# Patient Record
Sex: Female | Born: 1935
Health system: Southern US, Community
[De-identification: ages and names within clinical notes are randomized; demographics above are authoritative.]

## PROBLEM LIST (undated history)

## (undated) DIAGNOSIS — Z9889 Other specified postprocedural states: Secondary | ICD-10-CM

## (undated) DIAGNOSIS — H5440 Blindness, one eye, unspecified eye: Secondary | ICD-10-CM

## (undated) DIAGNOSIS — I499 Cardiac arrhythmia, unspecified: Secondary | ICD-10-CM

## (undated) DIAGNOSIS — G629 Polyneuropathy, unspecified: Secondary | ICD-10-CM

## (undated) DIAGNOSIS — Z87898 Personal history of other specified conditions: Secondary | ICD-10-CM

## (undated) DIAGNOSIS — R112 Nausea with vomiting, unspecified: Secondary | ICD-10-CM

## (undated) DIAGNOSIS — E119 Type 2 diabetes mellitus without complications: Secondary | ICD-10-CM

## (undated) DIAGNOSIS — Z8719 Personal history of other diseases of the digestive system: Secondary | ICD-10-CM

## (undated) DIAGNOSIS — H409 Unspecified glaucoma: Secondary | ICD-10-CM

## (undated) DIAGNOSIS — T4145XA Adverse effect of unspecified anesthetic, initial encounter: Secondary | ICD-10-CM

## (undated) DIAGNOSIS — I639 Cerebral infarction, unspecified: Secondary | ICD-10-CM

## (undated) DIAGNOSIS — R7302 Impaired glucose tolerance (oral): Secondary | ICD-10-CM

## (undated) DIAGNOSIS — G43109 Migraine with aura, not intractable, without status migrainosus: Secondary | ICD-10-CM

## (undated) DIAGNOSIS — T8859XA Other complications of anesthesia, initial encounter: Secondary | ICD-10-CM

## (undated) DIAGNOSIS — I10 Essential (primary) hypertension: Secondary | ICD-10-CM

## (undated) DIAGNOSIS — M3500 Sicca syndrome, unspecified: Secondary | ICD-10-CM

## (undated) DIAGNOSIS — J189 Pneumonia, unspecified organism: Secondary | ICD-10-CM

## (undated) DIAGNOSIS — I480 Paroxysmal atrial fibrillation: Secondary | ICD-10-CM

## (undated) DIAGNOSIS — T6701XA Heatstroke and sunstroke, initial encounter: Secondary | ICD-10-CM

## (undated) DIAGNOSIS — T7840XA Allergy, unspecified, initial encounter: Secondary | ICD-10-CM

## (undated) DIAGNOSIS — E871 Hypo-osmolality and hyponatremia: Secondary | ICD-10-CM

## (undated) DIAGNOSIS — H548 Legal blindness, as defined in USA: Secondary | ICD-10-CM

## (undated) DIAGNOSIS — R569 Unspecified convulsions: Secondary | ICD-10-CM

## (undated) DIAGNOSIS — G4730 Sleep apnea, unspecified: Secondary | ICD-10-CM

## (undated) DIAGNOSIS — M810 Age-related osteoporosis without current pathological fracture: Secondary | ICD-10-CM

## (undated) DIAGNOSIS — J9601 Acute respiratory failure with hypoxia: Secondary | ICD-10-CM

## (undated) DIAGNOSIS — R269 Unspecified abnormalities of gait and mobility: Secondary | ICD-10-CM

## (undated) DIAGNOSIS — M1990 Unspecified osteoarthritis, unspecified site: Secondary | ICD-10-CM

## (undated) DIAGNOSIS — G43909 Migraine, unspecified, not intractable, without status migrainosus: Secondary | ICD-10-CM

## (undated) DIAGNOSIS — K219 Gastro-esophageal reflux disease without esophagitis: Secondary | ICD-10-CM

## (undated) HISTORY — DX: Cerebral infarction, unspecified: I63.9

## (undated) HISTORY — DX: Blindness, one eye, unspecified eye: H54.40

## (undated) HISTORY — DX: Polyneuropathy, unspecified: G62.9

## (undated) HISTORY — DX: Age-related osteoporosis without current pathological fracture: M81.0

## (undated) HISTORY — DX: Heatstroke and sunstroke, initial encounter: T67.01XA

## (undated) HISTORY — DX: Unspecified abnormalities of gait and mobility: R26.9

## (undated) HISTORY — DX: Unspecified osteoarthritis, unspecified site: M19.90

## (undated) HISTORY — PX: CATARACT EXTRACTION: SUR2

## (undated) HISTORY — DX: Sjogren syndrome, unspecified: M35.00

## (undated) HISTORY — DX: Allergy, unspecified, initial encounter: T78.40XA

## (undated) HISTORY — PX: LUMBAR LAMINECTOMY: SHX95

## (undated) HISTORY — DX: Migraine with aura, not intractable, without status migrainosus: G43.109

## (undated) HISTORY — DX: Personal history of other diseases of the digestive system: Z87.19

## (undated) HISTORY — DX: Unspecified convulsions: R56.9

## (undated) HISTORY — DX: Gastro-esophageal reflux disease without esophagitis: K21.9

## (undated) HISTORY — DX: Impaired glucose tolerance (oral): R73.02

## (undated) HISTORY — PX: TONSILLECTOMY AND ADENOIDECTOMY: SUR1326

## (undated) HISTORY — DX: Paroxysmal atrial fibrillation: I48.0

## (undated) HISTORY — PX: DILATION AND CURETTAGE OF UTERUS: SHX78

## (undated) HISTORY — PX: EYE SURGERY: SHX253

## (undated) HISTORY — DX: Unspecified glaucoma: H40.9

## (undated) HISTORY — PX: TOTAL ABDOMINAL HYSTERECTOMY W/ BILATERAL SALPINGOOPHORECTOMY: SHX83

## (undated) HISTORY — PX: SHOULDER OPEN ROTATOR CUFF REPAIR: SHX2407

## (undated) HISTORY — PX: KNEE ARTHROSCOPY: SUR90

## (undated) HISTORY — PX: BONE TUMOR EXCISION: SHX1254

## (undated) SURGERY — IMAGING PROCEDURE, GI TRACT, INTRALUMINAL, VIA CAPSULE
Anesthesia: LOCAL

---

## 1998-04-04 ENCOUNTER — Other Ambulatory Visit: Admission: RE | Admit: 1998-04-04 | Discharge: 1998-04-04 | Payer: Self-pay | Admitting: Gastroenterology

## 1998-04-19 ENCOUNTER — Ambulatory Visit (HOSPITAL_COMMUNITY): Admission: RE | Admit: 1998-04-19 | Discharge: 1998-04-19 | Payer: Self-pay | Admitting: Gastroenterology

## 1998-05-09 ENCOUNTER — Ambulatory Visit (HOSPITAL_COMMUNITY): Admission: RE | Admit: 1998-05-09 | Discharge: 1998-05-09 | Payer: Self-pay | Admitting: Obstetrics & Gynecology

## 1998-11-16 ENCOUNTER — Other Ambulatory Visit: Admission: RE | Admit: 1998-11-16 | Discharge: 1998-11-16 | Payer: Self-pay | Admitting: Obstetrics and Gynecology

## 1998-11-16 ENCOUNTER — Encounter (INDEPENDENT_AMBULATORY_CARE_PROVIDER_SITE_OTHER): Payer: Self-pay

## 1998-12-25 ENCOUNTER — Encounter (INDEPENDENT_AMBULATORY_CARE_PROVIDER_SITE_OTHER): Payer: Self-pay | Admitting: Specialist

## 1998-12-25 ENCOUNTER — Inpatient Hospital Stay (HOSPITAL_COMMUNITY): Admission: RE | Admit: 1998-12-25 | Discharge: 1998-12-27 | Payer: Self-pay | Admitting: Obstetrics and Gynecology

## 2001-09-22 ENCOUNTER — Other Ambulatory Visit: Admission: RE | Admit: 2001-09-22 | Discharge: 2001-09-22 | Payer: Self-pay | Admitting: Obstetrics and Gynecology

## 2003-03-31 ENCOUNTER — Encounter: Admission: RE | Admit: 2003-03-31 | Discharge: 2003-03-31 | Payer: Self-pay | Admitting: Internal Medicine

## 2004-01-06 ENCOUNTER — Encounter: Admission: RE | Admit: 2004-01-06 | Discharge: 2004-01-06 | Payer: Self-pay | Admitting: Family Medicine

## 2004-01-20 ENCOUNTER — Ambulatory Visit: Payer: Self-pay | Admitting: Internal Medicine

## 2004-01-20 ENCOUNTER — Ambulatory Visit: Payer: Self-pay

## 2004-02-03 ENCOUNTER — Ambulatory Visit: Payer: Self-pay

## 2004-02-08 ENCOUNTER — Ambulatory Visit: Payer: Self-pay | Admitting: Cardiology

## 2004-02-17 ENCOUNTER — Ambulatory Visit: Payer: Self-pay | Admitting: Cardiology

## 2004-04-25 ENCOUNTER — Ambulatory Visit: Payer: Self-pay | Admitting: Cardiology

## 2004-04-26 ENCOUNTER — Ambulatory Visit: Payer: Self-pay | Admitting: *Deleted

## 2004-05-29 ENCOUNTER — Inpatient Hospital Stay (HOSPITAL_COMMUNITY): Admission: EM | Admit: 2004-05-29 | Discharge: 2004-05-31 | Payer: Self-pay | Admitting: Family Medicine

## 2004-05-29 ENCOUNTER — Ambulatory Visit: Payer: Self-pay | Admitting: Family Medicine

## 2004-06-08 ENCOUNTER — Ambulatory Visit: Payer: Self-pay | Admitting: Family Medicine

## 2004-06-13 ENCOUNTER — Ambulatory Visit: Payer: Self-pay | Admitting: Family Medicine

## 2004-06-20 ENCOUNTER — Ambulatory Visit: Payer: Self-pay | Admitting: Cardiology

## 2004-06-21 ENCOUNTER — Ambulatory Visit: Payer: Self-pay | Admitting: Internal Medicine

## 2004-07-12 ENCOUNTER — Ambulatory Visit: Payer: Self-pay | Admitting: Family Medicine

## 2004-09-21 ENCOUNTER — Ambulatory Visit: Payer: Self-pay | Admitting: Internal Medicine

## 2004-09-26 ENCOUNTER — Ambulatory Visit: Payer: Self-pay | Admitting: Cardiology

## 2004-11-27 ENCOUNTER — Ambulatory Visit: Payer: Self-pay | Admitting: *Deleted

## 2004-11-28 ENCOUNTER — Ambulatory Visit: Payer: Self-pay | Admitting: Cardiology

## 2004-12-18 ENCOUNTER — Other Ambulatory Visit: Admission: RE | Admit: 2004-12-18 | Discharge: 2004-12-18 | Payer: Self-pay | Admitting: Obstetrics and Gynecology

## 2005-03-18 HISTORY — PX: FOOT FRACTURE SURGERY: SHX645

## 2005-03-20 ENCOUNTER — Encounter: Admission: RE | Admit: 2005-03-20 | Discharge: 2005-03-20 | Payer: Self-pay | Admitting: Gastroenterology

## 2005-05-15 ENCOUNTER — Ambulatory Visit: Payer: Self-pay | Admitting: Cardiology

## 2005-05-23 ENCOUNTER — Ambulatory Visit: Payer: Self-pay | Admitting: Cardiology

## 2005-06-28 ENCOUNTER — Ambulatory Visit: Payer: Self-pay | Admitting: Family Medicine

## 2005-10-30 ENCOUNTER — Ambulatory Visit: Payer: Self-pay | Admitting: Family Medicine

## 2005-11-04 ENCOUNTER — Ambulatory Visit: Payer: Self-pay | Admitting: Cardiology

## 2005-11-07 ENCOUNTER — Ambulatory Visit: Payer: Self-pay | Admitting: Cardiology

## 2005-11-14 ENCOUNTER — Encounter: Admission: RE | Admit: 2005-11-14 | Discharge: 2005-11-14 | Payer: Self-pay | Admitting: Family Medicine

## 2006-01-14 ENCOUNTER — Ambulatory Visit: Payer: Self-pay | Admitting: Family Medicine

## 2006-01-14 LAB — CONVERTED CEMR LAB
BUN: 21 mg/dL (ref 6–23)
CO2: 29 meq/L (ref 19–32)
Calcium: 10.1 mg/dL (ref 8.4–10.5)
Chloride: 105 meq/L (ref 96–112)
Creatinine, Ser: 0.8 mg/dL (ref 0.4–1.2)
GFR calc non Af Amer: 76 mL/min
Glomerular Filtration Rate, Af Am: 92 mL/min/{1.73_m2}
Glucose, Bld: 129 mg/dL — ABNORMAL HIGH (ref 70–99)
Potassium: 3.8 meq/L (ref 3.5–5.1)
Sodium: 139 meq/L (ref 135–145)

## 2006-01-28 ENCOUNTER — Ambulatory Visit: Payer: Self-pay | Admitting: Family Medicine

## 2006-01-28 LAB — CONVERTED CEMR LAB
Glucose, Bld: 123 mg/dL — ABNORMAL HIGH (ref 70–99)
Hgb A1c MFr Bld: 6.8 % — ABNORMAL HIGH (ref 4.6–6.0)

## 2006-02-11 ENCOUNTER — Ambulatory Visit: Payer: Self-pay | Admitting: Family Medicine

## 2006-04-30 ENCOUNTER — Ambulatory Visit: Payer: Self-pay | Admitting: Cardiology

## 2006-04-30 LAB — CONVERTED CEMR LAB
ALT: 40 units/L (ref 0–40)
AST: 34 units/L (ref 0–37)
Albumin: 3.9 g/dL (ref 3.5–5.2)
Alkaline Phosphatase: 56 units/L (ref 39–117)
Bilirubin, Direct: 0.2 mg/dL (ref 0.0–0.3)
Cholesterol: 136 mg/dL (ref 0–200)
HDL: 40.3 mg/dL (ref >39.0)
LDL Cholesterol: 67 mg/dL (ref 0–99)
Total Bilirubin: 0.6 mg/dL (ref 0.3–1.2)
Total CHOL/HDL Ratio: 3.4
Total Protein: 6.9 g/dL (ref 6.0–8.3)
Triglycerides: 143 mg/dL (ref 0–149)
VLDL: 29 mg/dL (ref 0–40)

## 2006-05-08 ENCOUNTER — Ambulatory Visit: Payer: Self-pay | Admitting: Cardiology

## 2006-05-16 ENCOUNTER — Ambulatory Visit: Payer: Self-pay | Admitting: Family Medicine

## 2006-05-22 ENCOUNTER — Encounter: Payer: Self-pay | Admitting: Family Medicine

## 2006-05-22 LAB — CONVERTED CEMR LAB
BUN: 18 mg/dL (ref 6–23)
Creatinine, Ser: 0.91 mg/dL (ref 0.40–1.20)

## 2006-05-27 ENCOUNTER — Encounter: Admission: RE | Admit: 2006-05-27 | Discharge: 2006-05-27 | Payer: Self-pay | Admitting: Family Medicine

## 2006-08-27 ENCOUNTER — Telehealth (INDEPENDENT_AMBULATORY_CARE_PROVIDER_SITE_OTHER): Payer: Self-pay | Admitting: *Deleted

## 2006-08-28 DIAGNOSIS — I10 Essential (primary) hypertension: Secondary | ICD-10-CM | POA: Insufficient documentation

## 2006-08-28 DIAGNOSIS — M810 Age-related osteoporosis without current pathological fracture: Secondary | ICD-10-CM | POA: Insufficient documentation

## 2006-08-28 DIAGNOSIS — K219 Gastro-esophageal reflux disease without esophagitis: Secondary | ICD-10-CM | POA: Insufficient documentation

## 2006-08-28 DIAGNOSIS — E781 Pure hyperglyceridemia: Secondary | ICD-10-CM | POA: Insufficient documentation

## 2006-08-28 DIAGNOSIS — H409 Unspecified glaucoma: Secondary | ICD-10-CM | POA: Insufficient documentation

## 2006-08-28 DIAGNOSIS — M1990 Unspecified osteoarthritis, unspecified site: Secondary | ICD-10-CM | POA: Insufficient documentation

## 2006-08-28 DIAGNOSIS — G43909 Migraine, unspecified, not intractable, without status migrainosus: Secondary | ICD-10-CM | POA: Insufficient documentation

## 2006-08-28 DIAGNOSIS — L408 Other psoriasis: Secondary | ICD-10-CM | POA: Insufficient documentation

## 2006-11-05 ENCOUNTER — Ambulatory Visit: Payer: Self-pay | Admitting: Cardiology

## 2006-11-05 LAB — CONVERTED CEMR LAB
ALT: 33 units/L (ref 0–35)
AST: 29 units/L (ref 0–37)
Albumin: 3.7 g/dL (ref 3.5–5.2)
Alkaline Phosphatase: 56 units/L (ref 39–117)
Bilirubin, Direct: 0.1 mg/dL (ref 0.0–0.3)
Cholesterol: 161 mg/dL (ref 0–200)
HDL: 46.8 mg/dL (ref >39.0)
LDL Cholesterol: 94 mg/dL (ref 0–99)
Total Bilirubin: 0.6 mg/dL (ref 0.3–1.2)
Total CHOL/HDL Ratio: 3.4
Total Protein: 6.5 g/dL (ref 6.0–8.3)
Triglycerides: 100 mg/dL (ref 0–149)
VLDL: 20 mg/dL (ref 0–40)

## 2006-11-06 ENCOUNTER — Ambulatory Visit: Payer: Self-pay | Admitting: Cardiology

## 2006-11-18 ENCOUNTER — Ambulatory Visit: Payer: Self-pay | Admitting: Family Medicine

## 2006-11-18 DIAGNOSIS — R197 Diarrhea, unspecified: Secondary | ICD-10-CM | POA: Insufficient documentation

## 2006-11-18 DIAGNOSIS — K921 Melena: Secondary | ICD-10-CM | POA: Insufficient documentation

## 2006-11-19 ENCOUNTER — Encounter: Payer: Self-pay | Admitting: Family Medicine

## 2006-11-20 ENCOUNTER — Encounter (INDEPENDENT_AMBULATORY_CARE_PROVIDER_SITE_OTHER): Payer: Self-pay | Admitting: *Deleted

## 2006-11-24 LAB — CONVERTED CEMR LAB
ALT: 28 units/L (ref 0–35)
AST: 28 units/L (ref 0–37)
Albumin: 3.4 g/dL — ABNORMAL LOW (ref 3.5–5.2)
Alkaline Phosphatase: 63 units/L (ref 39–117)
BUN: 14 mg/dL (ref 6–23)
Basophils Absolute: 0 10*3/uL (ref 0.0–0.1)
Basophils Relative: 0.4 % (ref 0.0–1.0)
Bilirubin, Direct: 0.1 mg/dL (ref 0.0–0.3)
CO2: 28 meq/L (ref 19–32)
Calcium: 10 mg/dL (ref 8.4–10.5)
Chloride: 105 meq/L (ref 96–112)
Creatinine, Ser: 0.9 mg/dL (ref 0.4–1.2)
Eosinophils Absolute: 0.1 10*3/uL (ref 0.0–0.6)
Eosinophils Relative: 1.9 % (ref 0.0–5.0)
GFR calc Af Amer: 80 mL/min
GFR calc non Af Amer: 66 mL/min
Glucose, Bld: 103 mg/dL — ABNORMAL HIGH (ref 70–99)
HCT: 41.2 % (ref 36.0–46.0)
Hemoglobin: 14.4 g/dL (ref 12.0–15.0)
Lymphocytes Relative: 32.3 % (ref 12.0–46.0)
MCHC: 34.9 g/dL (ref 30.0–36.0)
MCV: 89.5 fL (ref 78.0–100.0)
Monocytes Absolute: 1 10*3/uL — ABNORMAL HIGH (ref 0.2–0.7)
Monocytes Relative: 13.2 % — ABNORMAL HIGH (ref 3.0–11.0)
Neutro Abs: 3.8 10*3/uL (ref 1.4–7.7)
Neutrophils Relative %: 52.2 % (ref 43.0–77.0)
Platelets: 205 10*3/uL (ref 150–400)
Potassium: 4 meq/L (ref 3.5–5.1)
RBC: 4.61 M/uL (ref 3.87–5.11)
RDW: 12.7 % (ref 11.5–14.6)
Sodium: 140 meq/L (ref 135–145)
Total Bilirubin: 0.5 mg/dL (ref 0.3–1.2)
Total Protein: 6.6 g/dL (ref 6.0–8.3)
WBC: 7.3 10*3/uL (ref 4.5–10.5)

## 2006-11-27 ENCOUNTER — Encounter: Payer: Self-pay | Admitting: Family Medicine

## 2006-12-31 ENCOUNTER — Ambulatory Visit: Payer: Self-pay | Admitting: Family Medicine

## 2006-12-31 DIAGNOSIS — H6690 Otitis media, unspecified, unspecified ear: Secondary | ICD-10-CM | POA: Insufficient documentation

## 2006-12-31 DIAGNOSIS — J069 Acute upper respiratory infection, unspecified: Secondary | ICD-10-CM | POA: Insufficient documentation

## 2007-01-07 ENCOUNTER — Telehealth (INDEPENDENT_AMBULATORY_CARE_PROVIDER_SITE_OTHER): Payer: Self-pay | Admitting: *Deleted

## 2007-01-08 ENCOUNTER — Ambulatory Visit: Payer: Self-pay | Admitting: Family Medicine

## 2007-01-08 DIAGNOSIS — H9209 Otalgia, unspecified ear: Secondary | ICD-10-CM | POA: Insufficient documentation

## 2007-01-14 ENCOUNTER — Ambulatory Visit: Payer: Self-pay | Admitting: Family Medicine

## 2007-01-15 ENCOUNTER — Encounter: Payer: Self-pay | Admitting: Family Medicine

## 2007-01-19 ENCOUNTER — Telehealth: Payer: Self-pay | Admitting: Family Medicine

## 2007-01-23 ENCOUNTER — Encounter: Payer: Self-pay | Admitting: Family Medicine

## 2007-01-29 ENCOUNTER — Ambulatory Visit: Payer: Self-pay | Admitting: Cardiology

## 2007-01-29 LAB — CONVERTED CEMR LAB
ALT: 31 units/L (ref 0–35)
AST: 30 units/L (ref 0–37)
Albumin: 4 g/dL (ref 3.5–5.2)
Alkaline Phosphatase: 58 units/L (ref 39–117)
Bilirubin, Direct: 0.1 mg/dL (ref 0.0–0.3)
Cholesterol: 140 mg/dL (ref 0–200)
HDL: 43.8 mg/dL (ref >39.0)
LDL Cholesterol: 67 mg/dL (ref 0–99)
Total Bilirubin: 0.6 mg/dL (ref 0.3–1.2)
Total CHOL/HDL Ratio: 3.2
Total CK: 78 units/L (ref 7–177)
Total Protein: 7.2 g/dL (ref 6.0–8.3)
Triglycerides: 146 mg/dL (ref 0–149)
VLDL: 29 mg/dL (ref 0–40)

## 2007-02-05 ENCOUNTER — Ambulatory Visit: Payer: Self-pay | Admitting: Cardiology

## 2007-02-06 ENCOUNTER — Encounter: Payer: Self-pay | Admitting: Family Medicine

## 2007-02-11 ENCOUNTER — Ambulatory Visit (HOSPITAL_COMMUNITY): Admission: RE | Admit: 2007-02-11 | Discharge: 2007-02-11 | Payer: Self-pay | Admitting: Otolaryngology

## 2007-02-17 ENCOUNTER — Encounter: Payer: Self-pay | Admitting: Family Medicine

## 2007-04-07 ENCOUNTER — Ambulatory Visit: Payer: Self-pay | Admitting: Family Medicine

## 2007-04-07 DIAGNOSIS — G4700 Insomnia, unspecified: Secondary | ICD-10-CM | POA: Insufficient documentation

## 2007-04-09 ENCOUNTER — Encounter: Payer: Self-pay | Admitting: Family Medicine

## 2007-04-21 ENCOUNTER — Encounter: Payer: Self-pay | Admitting: Family Medicine

## 2007-05-29 ENCOUNTER — Telehealth (INDEPENDENT_AMBULATORY_CARE_PROVIDER_SITE_OTHER): Payer: Self-pay | Admitting: *Deleted

## 2007-06-08 ENCOUNTER — Telehealth (INDEPENDENT_AMBULATORY_CARE_PROVIDER_SITE_OTHER): Payer: Self-pay | Admitting: *Deleted

## 2007-06-08 ENCOUNTER — Encounter: Payer: Self-pay | Admitting: Family Medicine

## 2007-07-06 ENCOUNTER — Telehealth (INDEPENDENT_AMBULATORY_CARE_PROVIDER_SITE_OTHER): Payer: Self-pay | Admitting: *Deleted

## 2007-07-07 ENCOUNTER — Ambulatory Visit: Payer: Self-pay | Admitting: Family Medicine

## 2007-07-15 ENCOUNTER — Ambulatory Visit: Payer: Self-pay | Admitting: Cardiology

## 2007-07-15 LAB — CONVERTED CEMR LAB
ALT: 31 units/L (ref 0–35)
AST: 29 units/L (ref 0–37)
Albumin: 3.7 g/dL (ref 3.5–5.2)
Alkaline Phosphatase: 55 units/L (ref 39–117)
Bilirubin, Direct: 0.1 mg/dL (ref 0.0–0.3)
Cholesterol: 135 mg/dL (ref 0–200)
HDL: 34.1 mg/dL — ABNORMAL LOW (ref >39.0)
LDL Cholesterol: 74 mg/dL (ref 0–99)
Total Bilirubin: 0.5 mg/dL (ref 0.3–1.2)
Total CHOL/HDL Ratio: 4
Total Protein: 6.4 g/dL (ref 6.0–8.3)
Triglycerides: 134 mg/dL (ref 0–149)
VLDL: 27 mg/dL (ref 0–40)

## 2007-07-23 ENCOUNTER — Ambulatory Visit: Payer: Self-pay | Admitting: Internal Medicine

## 2007-07-27 ENCOUNTER — Telehealth (INDEPENDENT_AMBULATORY_CARE_PROVIDER_SITE_OTHER): Payer: Self-pay | Admitting: *Deleted

## 2007-08-11 ENCOUNTER — Encounter: Payer: Self-pay | Admitting: Family Medicine

## 2007-09-14 ENCOUNTER — Encounter: Payer: Self-pay | Admitting: Family Medicine

## 2007-10-26 ENCOUNTER — Encounter: Payer: Self-pay | Admitting: Family Medicine

## 2007-10-29 ENCOUNTER — Ambulatory Visit: Payer: Self-pay | Admitting: Family Medicine

## 2007-10-29 DIAGNOSIS — R5381 Other malaise: Secondary | ICD-10-CM | POA: Insufficient documentation

## 2007-10-29 DIAGNOSIS — R5383 Other fatigue: Secondary | ICD-10-CM

## 2007-11-05 ENCOUNTER — Encounter (INDEPENDENT_AMBULATORY_CARE_PROVIDER_SITE_OTHER): Payer: Self-pay | Admitting: *Deleted

## 2007-11-05 ENCOUNTER — Telehealth (INDEPENDENT_AMBULATORY_CARE_PROVIDER_SITE_OTHER): Payer: Self-pay | Admitting: *Deleted

## 2007-11-05 LAB — CONVERTED CEMR LAB: Vit D, 1,25-Dihydroxy: 22 — ABNORMAL LOW (ref 30–89)

## 2007-11-09 ENCOUNTER — Encounter: Admission: RE | Admit: 2007-11-09 | Discharge: 2007-11-09 | Payer: Self-pay | Admitting: Family Medicine

## 2007-11-09 LAB — CONVERTED CEMR LAB
ALT: 40 units/L — ABNORMAL HIGH (ref 0–35)
AST: 42 units/L — ABNORMAL HIGH (ref 0–37)
Albumin: 4.2 g/dL (ref 3.5–5.2)
Alkaline Phosphatase: 55 units/L (ref 39–117)
BUN: 18 mg/dL (ref 6–23)
Basophils Absolute: 0 10*3/uL (ref 0.0–0.1)
Basophils Relative: 0.4 % (ref 0.0–3.0)
Bilirubin, Direct: 0.1 mg/dL (ref 0.0–0.3)
CO2: 27 meq/L (ref 19–32)
Calcium: 10.1 mg/dL (ref 8.4–10.5)
Chloride: 106 meq/L (ref 96–112)
Cholesterol: 176 mg/dL (ref 0–200)
Creatinine, Ser: 0.9 mg/dL (ref 0.4–1.2)
Eosinophils Absolute: 0.2 10*3/uL (ref 0.0–0.7)
Eosinophils Relative: 2.4 % (ref 0.0–5.0)
Folate: >20 ng/mL
GFR calc Af Amer: 80 mL/min
GFR calc non Af Amer: 66 mL/min
Glucose, Bld: 118 mg/dL — ABNORMAL HIGH (ref 70–99)
HCT: 41.8 % (ref 36.0–46.0)
HDL: 42.8 mg/dL (ref >39.0)
Hemoglobin: 14.3 g/dL (ref 12.0–15.0)
LDL Cholesterol: 101 mg/dL — ABNORMAL HIGH (ref 0–99)
Lymphocytes Relative: 29.9 % (ref 12.0–46.0)
MCHC: 34.3 g/dL (ref 30.0–36.0)
MCV: 89.3 fL (ref 78.0–100.0)
Monocytes Absolute: 0.5 10*3/uL (ref 0.1–1.0)
Monocytes Relative: 7.2 % (ref 3.0–12.0)
Neutro Abs: 4.3 10*3/uL (ref 1.4–7.7)
Neutrophils Relative %: 60.1 % (ref 43.0–77.0)
Platelets: 207 10*3/uL (ref 150–400)
Potassium: 4.2 meq/L (ref 3.5–5.1)
RBC: 4.68 M/uL (ref 3.87–5.11)
RDW: 13 % (ref 11.5–14.6)
Sodium: 139 meq/L (ref 135–145)
TSH: 0.77 microintl units/mL (ref 0.35–5.50)
Total Bilirubin: 0.7 mg/dL (ref 0.3–1.2)
Total CHOL/HDL Ratio: 4.1
Total Protein: 6.9 g/dL (ref 6.0–8.3)
Triglycerides: 162 mg/dL — ABNORMAL HIGH (ref 0–149)
VLDL: 32 mg/dL (ref 0–40)
Vitamin B-12: 280 pg/mL (ref 211–911)
WBC: 7.1 10*3/uL (ref 4.5–10.5)

## 2007-11-10 ENCOUNTER — Telehealth (INDEPENDENT_AMBULATORY_CARE_PROVIDER_SITE_OTHER): Payer: Self-pay | Admitting: *Deleted

## 2007-11-10 ENCOUNTER — Encounter (INDEPENDENT_AMBULATORY_CARE_PROVIDER_SITE_OTHER): Payer: Self-pay | Admitting: *Deleted

## 2007-12-18 ENCOUNTER — Telehealth: Payer: Self-pay | Admitting: Family Medicine

## 2008-01-19 ENCOUNTER — Ambulatory Visit: Payer: Self-pay | Admitting: Cardiology

## 2008-01-19 LAB — CONVERTED CEMR LAB
ALT: 33 units/L (ref 0–35)
AST: 33 units/L (ref 0–37)
Albumin: 3.9 g/dL (ref 3.5–5.2)
Alkaline Phosphatase: 58 units/L (ref 39–117)
Bilirubin, Direct: 0.2 mg/dL (ref 0.0–0.3)
Cholesterol: 120 mg/dL (ref 0–200)
HDL: 40.9 mg/dL (ref >39.0)
LDL Cholesterol: 56 mg/dL (ref 0–99)
Total Bilirubin: 0.6 mg/dL (ref 0.3–1.2)
Total CHOL/HDL Ratio: 2.9
Total Protein: 6.6 g/dL (ref 6.0–8.3)
Triglycerides: 118 mg/dL (ref 0–149)
VLDL: 24 mg/dL (ref 0–40)

## 2008-01-21 ENCOUNTER — Ambulatory Visit: Payer: Self-pay | Admitting: Cardiovascular Disease

## 2008-02-19 ENCOUNTER — Telehealth: Payer: Self-pay | Admitting: Family Medicine

## 2008-03-09 ENCOUNTER — Telehealth: Payer: Self-pay | Admitting: Family Medicine

## 2008-03-09 DIAGNOSIS — R920 Mammographic microcalcification found on diagnostic imaging of breast: Secondary | ICD-10-CM | POA: Insufficient documentation

## 2008-03-24 ENCOUNTER — Encounter: Payer: Self-pay | Admitting: Family Medicine

## 2008-04-01 ENCOUNTER — Encounter (INDEPENDENT_AMBULATORY_CARE_PROVIDER_SITE_OTHER): Payer: Self-pay | Admitting: *Deleted

## 2008-05-03 ENCOUNTER — Encounter: Payer: Self-pay | Admitting: Family Medicine

## 2008-07-18 ENCOUNTER — Ambulatory Visit: Payer: Self-pay | Admitting: Family Medicine

## 2008-07-18 DIAGNOSIS — E782 Mixed hyperlipidemia: Secondary | ICD-10-CM | POA: Insufficient documentation

## 2008-07-18 LAB — CONVERTED CEMR LAB
ALT: 31 units/L (ref 0–35)
AST: 32 units/L (ref 0–37)
Albumin: 4 g/dL (ref 3.5–5.2)
Alkaline Phosphatase: 57 units/L (ref 39–117)
Bilirubin, Direct: 0.2 mg/dL (ref 0.0–0.3)
Cholesterol: 117 mg/dL (ref 0–200)
HDL: 38.4 mg/dL — ABNORMAL LOW (ref >39.00)
LDL Cholesterol: 56 mg/dL (ref 0–99)
Total Bilirubin: 0.5 mg/dL (ref 0.3–1.2)
Total CHOL/HDL Ratio: 3
Total Protein: 6.7 g/dL (ref 6.0–8.3)
Triglycerides: 111 mg/dL (ref 0.0–149.0)
VLDL: 22.2 mg/dL (ref 0.0–40.0)

## 2008-07-19 ENCOUNTER — Telehealth (INDEPENDENT_AMBULATORY_CARE_PROVIDER_SITE_OTHER): Payer: Self-pay | Admitting: *Deleted

## 2008-07-25 ENCOUNTER — Ambulatory Visit: Payer: Self-pay | Admitting: Family Medicine

## 2008-07-26 LAB — CONVERTED CEMR LAB
BUN: 19 mg/dL (ref 6–23)
Basophils Absolute: 0 10*3/uL (ref 0.0–0.1)
Basophils Relative: 0.1 % (ref 0.0–3.0)
CO2: 29 meq/L (ref 19–32)
Calcium: 9.9 mg/dL (ref 8.4–10.5)
Chloride: 107 meq/L (ref 96–112)
Creatinine, Ser: 0.9 mg/dL (ref 0.4–1.2)
Eosinophils Absolute: 0.2 10*3/uL (ref 0.0–0.7)
Eosinophils Relative: 2.6 % (ref 0.0–5.0)
Folate: 14 ng/mL
GFR calc non Af Amer: 65.58 mL/min (ref >60)
Glucose, Bld: 126 mg/dL — ABNORMAL HIGH (ref 70–99)
HCT: 39.1 % (ref 36.0–46.0)
Hemoglobin: 13.6 g/dL (ref 12.0–15.0)
Lymphocytes Relative: 27.4 % (ref 12.0–46.0)
Lymphs Abs: 2.1 10*3/uL (ref 0.7–4.0)
MCHC: 34.7 g/dL (ref 30.0–36.0)
MCV: 88.2 fL (ref 78.0–100.0)
Monocytes Absolute: 0.6 10*3/uL (ref 0.1–1.0)
Monocytes Relative: 7.3 % (ref 3.0–12.0)
Neutro Abs: 4.8 10*3/uL (ref 1.4–7.7)
Neutrophils Relative %: 62.6 % (ref 43.0–77.0)
Platelets: 179 10*3/uL (ref 150.0–400.0)
Potassium: 4.1 meq/L (ref 3.5–5.1)
RBC: 4.43 M/uL (ref 3.87–5.11)
RDW: 13.2 % (ref 11.5–14.6)
Sodium: 140 meq/L (ref 135–145)
TSH: 0.84 microintl units/mL (ref 0.35–5.50)
Vit D, 25-Hydroxy: 35 ng/mL (ref 30–89)
Vitamin B-12: 245 pg/mL (ref 211–911)
WBC: 7.7 10*3/uL (ref 4.5–10.5)

## 2008-07-27 ENCOUNTER — Encounter (INDEPENDENT_AMBULATORY_CARE_PROVIDER_SITE_OTHER): Payer: Self-pay | Admitting: *Deleted

## 2008-07-28 ENCOUNTER — Encounter: Payer: Self-pay | Admitting: *Deleted

## 2008-07-28 ENCOUNTER — Ambulatory Visit: Payer: Self-pay | Admitting: Cardiovascular Disease

## 2008-08-29 ENCOUNTER — Encounter: Payer: Self-pay | Admitting: Family Medicine

## 2008-09-06 ENCOUNTER — Telehealth (INDEPENDENT_AMBULATORY_CARE_PROVIDER_SITE_OTHER): Payer: Self-pay | Admitting: *Deleted

## 2008-09-13 ENCOUNTER — Telehealth (INDEPENDENT_AMBULATORY_CARE_PROVIDER_SITE_OTHER): Payer: Self-pay | Admitting: *Deleted

## 2008-09-28 ENCOUNTER — Encounter: Payer: Self-pay | Admitting: Family Medicine

## 2008-09-29 ENCOUNTER — Telehealth (INDEPENDENT_AMBULATORY_CARE_PROVIDER_SITE_OTHER): Payer: Self-pay | Admitting: *Deleted

## 2008-10-11 ENCOUNTER — Telehealth (INDEPENDENT_AMBULATORY_CARE_PROVIDER_SITE_OTHER): Payer: Self-pay | Admitting: *Deleted

## 2008-10-27 ENCOUNTER — Ambulatory Visit: Payer: Self-pay | Admitting: Family Medicine

## 2008-10-31 ENCOUNTER — Encounter (INDEPENDENT_AMBULATORY_CARE_PROVIDER_SITE_OTHER): Payer: Self-pay | Admitting: *Deleted

## 2008-10-31 LAB — CONVERTED CEMR LAB
ALT: 35 units/L (ref 0–35)
AST: 37 units/L (ref 0–37)
Albumin: 4.1 g/dL (ref 3.5–5.2)
Alkaline Phosphatase: 61 units/L (ref 39–117)
Bilirubin, Direct: 0 mg/dL (ref 0.0–0.3)
Cholesterol: 133 mg/dL (ref 0–200)
HDL: 38.5 mg/dL — ABNORMAL LOW (ref >39.00)
LDL Cholesterol: 67 mg/dL (ref 0–99)
Total Bilirubin: 0.6 mg/dL (ref 0.3–1.2)
Total CHOL/HDL Ratio: 3
Total Protein: 7 g/dL (ref 6.0–8.3)
Triglycerides: 139 mg/dL (ref 0.0–149.0)
VLDL: 27.8 mg/dL (ref 0.0–40.0)

## 2008-11-07 ENCOUNTER — Encounter: Payer: Self-pay | Admitting: Family Medicine

## 2008-12-19 DIAGNOSIS — G4730 Sleep apnea, unspecified: Secondary | ICD-10-CM | POA: Insufficient documentation

## 2009-01-13 ENCOUNTER — Ambulatory Visit: Payer: Self-pay | Admitting: Family Medicine

## 2009-01-17 ENCOUNTER — Encounter: Payer: Self-pay | Admitting: Family Medicine

## 2009-01-17 LAB — CONVERTED CEMR LAB
ALT: 30 units/L (ref 0–35)
AST: 24 units/L (ref 0–37)
Albumin: 3.9 g/dL (ref 3.5–5.2)
Alkaline Phosphatase: 62 units/L (ref 39–117)
Bilirubin, Direct: 0.1 mg/dL (ref 0.0–0.3)
Cholesterol: 130 mg/dL (ref 0–200)
HDL: 40.4 mg/dL (ref >39.00)
LDL Cholesterol: 60 mg/dL (ref 0–99)
Total Bilirubin: 0.5 mg/dL (ref 0.3–1.2)
Total CHOL/HDL Ratio: 3
Total Protein: 6.8 g/dL (ref 6.0–8.3)
Triglycerides: 147 mg/dL (ref 0.0–149.0)
VLDL: 29.4 mg/dL (ref 0.0–40.0)

## 2009-01-23 ENCOUNTER — Ambulatory Visit: Payer: Self-pay | Admitting: Internal Medicine

## 2009-01-23 LAB — CONVERTED CEMR LAB
Cholesterol, target level: 200 mg/dL
HDL goal, serum: 40 mg/dL
LDL Goal: 70 mg/dL

## 2009-01-27 ENCOUNTER — Ambulatory Visit: Payer: Self-pay | Admitting: Family Medicine

## 2009-01-27 DIAGNOSIS — R109 Unspecified abdominal pain: Secondary | ICD-10-CM | POA: Insufficient documentation

## 2009-01-27 DIAGNOSIS — R300 Dysuria: Secondary | ICD-10-CM | POA: Insufficient documentation

## 2009-01-27 DIAGNOSIS — Z8719 Personal history of other diseases of the digestive system: Secondary | ICD-10-CM | POA: Insufficient documentation

## 2009-01-27 DIAGNOSIS — F411 Generalized anxiety disorder: Secondary | ICD-10-CM | POA: Insufficient documentation

## 2009-01-27 HISTORY — DX: Personal history of other diseases of the digestive system: Z87.19

## 2009-01-28 ENCOUNTER — Encounter: Payer: Self-pay | Admitting: Family Medicine

## 2009-04-17 ENCOUNTER — Telehealth (INDEPENDENT_AMBULATORY_CARE_PROVIDER_SITE_OTHER): Payer: Self-pay | Admitting: *Deleted

## 2009-04-27 ENCOUNTER — Encounter: Payer: Self-pay | Admitting: Family Medicine

## 2009-05-03 ENCOUNTER — Telehealth: Payer: Self-pay | Admitting: Family Medicine

## 2009-05-29 ENCOUNTER — Encounter: Payer: Self-pay | Admitting: Family Medicine

## 2009-06-14 ENCOUNTER — Telehealth: Payer: Self-pay | Admitting: Family Medicine

## 2009-06-14 ENCOUNTER — Telehealth (INDEPENDENT_AMBULATORY_CARE_PROVIDER_SITE_OTHER): Payer: Self-pay | Admitting: *Deleted

## 2009-07-04 ENCOUNTER — Encounter: Payer: Self-pay | Admitting: Family Medicine

## 2009-07-17 ENCOUNTER — Ambulatory Visit: Payer: Self-pay | Admitting: Family Medicine

## 2009-07-17 DIAGNOSIS — Z87898 Personal history of other specified conditions: Secondary | ICD-10-CM

## 2009-07-17 HISTORY — DX: Personal history of other specified conditions: Z87.898

## 2009-07-18 ENCOUNTER — Telehealth (INDEPENDENT_AMBULATORY_CARE_PROVIDER_SITE_OTHER): Payer: Self-pay | Admitting: *Deleted

## 2009-07-18 ENCOUNTER — Encounter: Payer: Self-pay | Admitting: Family Medicine

## 2009-07-18 LAB — CONVERTED CEMR LAB
ALT: 21 units/L (ref 0–35)
AST: 28 units/L (ref 0–37)
Albumin: 4.2 g/dL (ref 3.5–5.2)
Alkaline Phosphatase: 49 units/L (ref 39–117)
BUN: 16 mg/dL (ref 6–23)
Basophils Absolute: 0 10*3/uL (ref 0.0–0.1)
Basophils Relative: 0.3 % (ref 0.0–3.0)
Bilirubin, Direct: 0 mg/dL (ref 0.0–0.3)
CO2: 28 meq/L (ref 19–32)
Calcium: 10 mg/dL (ref 8.4–10.5)
Chloride: 103 meq/L (ref 96–112)
Cholesterol: 129 mg/dL (ref 0–200)
Creatinine, Ser: 0.8 mg/dL (ref 0.4–1.2)
Eosinophils Absolute: 0.2 10*3/uL (ref 0.0–0.7)
Eosinophils Relative: 2.3 % (ref 0.0–5.0)
Ferritin: 33.8 ng/mL (ref 10.0–291.0)
Folate: >20 ng/mL
GFR calc non Af Amer: 74.93 mL/min (ref >60)
Glucose, Bld: 103 mg/dL — ABNORMAL HIGH (ref 70–99)
HCT: 40.7 % (ref 36.0–46.0)
HDL: 47.2 mg/dL (ref >39.00)
Hemoglobin: 13.7 g/dL (ref 12.0–15.0)
Iron: 58 ug/dL (ref 42–145)
LDL Cholesterol: 56 mg/dL (ref 0–99)
Lymphocytes Relative: 26.5 % (ref 12.0–46.0)
Lymphs Abs: 2 10*3/uL (ref 0.7–4.0)
MCHC: 33.7 g/dL (ref 30.0–36.0)
MCV: 92 fL (ref 78.0–100.0)
Monocytes Absolute: 0.5 10*3/uL (ref 0.1–1.0)
Monocytes Relative: 6.4 % (ref 3.0–12.0)
Neutro Abs: 4.9 10*3/uL (ref 1.4–7.7)
Neutrophils Relative %: 64.5 % (ref 43.0–77.0)
Platelets: 190 10*3/uL (ref 150.0–400.0)
Potassium: 4.3 meq/L (ref 3.5–5.1)
RBC: 4.43 M/uL (ref 3.87–5.11)
RDW: 14.7 % — ABNORMAL HIGH (ref 11.5–14.6)
Saturation Ratios: 13.1 % — ABNORMAL LOW (ref 20.0–50.0)
Sodium: 138 meq/L (ref 135–145)
TSH: 0.8 microintl units/mL (ref 0.35–5.50)
Total Bilirubin: 0.4 mg/dL (ref 0.3–1.2)
Total CHOL/HDL Ratio: 3
Total Protein: 6.7 g/dL (ref 6.0–8.3)
Transferrin: 315.8 mg/dL (ref 212.0–360.0)
Triglycerides: 128 mg/dL (ref 0.0–149.0)
VLDL: 25.6 mg/dL (ref 0.0–40.0)
Vitamin B-12: 347 pg/mL (ref 211–911)
WBC: 7.6 10*3/uL (ref 4.5–10.5)

## 2009-07-19 ENCOUNTER — Ambulatory Visit: Payer: Self-pay | Admitting: Family Medicine

## 2009-07-19 DIAGNOSIS — D518 Other vitamin B12 deficiency anemias: Secondary | ICD-10-CM | POA: Insufficient documentation

## 2009-07-19 LAB — CONVERTED CEMR LAB: Vit D, 25-Hydroxy: 29 ng/mL — ABNORMAL LOW (ref 30–89)

## 2009-07-27 ENCOUNTER — Ambulatory Visit: Payer: Self-pay | Admitting: Family Medicine

## 2009-08-03 ENCOUNTER — Ambulatory Visit: Payer: Self-pay | Admitting: Family Medicine

## 2009-08-04 ENCOUNTER — Encounter: Payer: Self-pay | Admitting: Family Medicine

## 2009-08-10 ENCOUNTER — Ambulatory Visit: Payer: Self-pay | Admitting: Family Medicine

## 2009-08-17 ENCOUNTER — Ambulatory Visit: Payer: Self-pay | Admitting: Family Medicine

## 2009-08-17 LAB — CONVERTED CEMR LAB: Vitamin B-12: 540 pg/mL (ref 211–911)

## 2009-08-24 ENCOUNTER — Telehealth: Payer: Self-pay | Admitting: Family Medicine

## 2009-09-20 ENCOUNTER — Telehealth (INDEPENDENT_AMBULATORY_CARE_PROVIDER_SITE_OTHER): Payer: Self-pay | Admitting: *Deleted

## 2009-09-25 ENCOUNTER — Ambulatory Visit: Payer: Self-pay | Admitting: Family Medicine

## 2009-09-25 DIAGNOSIS — B373 Candidiasis of vulva and vagina: Secondary | ICD-10-CM

## 2009-09-25 DIAGNOSIS — B3731 Acute candidiasis of vulva and vagina: Secondary | ICD-10-CM | POA: Insufficient documentation

## 2009-09-25 LAB — CONVERTED CEMR LAB
Bilirubin Urine: NEGATIVE
Blood in Urine, dipstick: NEGATIVE
Glucose, Urine, Semiquant: NEGATIVE
Ketones, urine, test strip: NEGATIVE
Nitrite: NEGATIVE
Protein, U semiquant: NEGATIVE
Specific Gravity, Urine: <1.005
Urobilinogen, UA: 0.2
WBC Urine, dipstick: NEGATIVE
pH: 7

## 2009-09-26 ENCOUNTER — Encounter: Payer: Self-pay | Admitting: Family Medicine

## 2009-11-02 ENCOUNTER — Telehealth: Payer: Self-pay | Admitting: Family Medicine

## 2009-12-21 ENCOUNTER — Encounter: Payer: Self-pay | Admitting: Family Medicine

## 2010-01-02 ENCOUNTER — Encounter: Admission: RE | Admit: 2010-01-02 | Discharge: 2010-01-02 | Payer: Self-pay | Admitting: Gastroenterology

## 2010-01-04 ENCOUNTER — Encounter: Admission: RE | Admit: 2010-01-04 | Discharge: 2010-01-04 | Payer: Self-pay | Admitting: Gastroenterology

## 2010-01-08 ENCOUNTER — Ambulatory Visit: Payer: Self-pay | Admitting: Cardiology

## 2010-04-17 NOTE — Progress Notes (Signed)
Summary: Pharmacy Notice  Phone Note From Pharmacy   [facility]: [facility] [phone]* Summary of Call: Attention Dr. Laury Axon: Patients insurance will only pay for a 90 day supply of Loranzepam 0.5mg  tablet. Can she get a Tx for 270? Initial call taken by: Harold Barban,  August 24, 2009 2:36 PM  Follow-up for Phone Call        yes Follow-up by: Loreen Freud DO,  August 24, 2009 2:48 PM    Prescriptions: ATIVAN 0.5 MG TABS (LORAZEPAM) 1 by mouth three times a day as needed  #270 x 0   Entered by:   Army Fossa CMA   Authorized by:   Loreen Freud DO   Signed by:   Army Fossa CMA on 08/24/2009   Method used:   Printed then faxed to ...       [facility] [phone]* (retail)       [address]       [place], [place]  [address]       Ph: [phone]       Fax: [phone]   RxID:   [id number]

## 2010-04-17 NOTE — Assessment & Plan Note (Signed)
Summary: no energy/cbs   Vital Signs:  Patient profile:   75 year old female Height:      65 inches Weight:      176.25 pounds BMI:     29.44 Pulse rate:   80 / minute Pulse rhythm:   regular BP sitting:   128 / 74  (left arm) Cuff size:   large  Vitals Entered By: Army Fossa CMA (Jul 17, 2009 8:24 AM) CC: Pt here she states she has had shingles x 6 weeks, she states she has no energy.    History of Present Illness: Pt here c/o hx shingles about 6 weeks ago and is still weak and tired from it.  Rash has almost resolved.  Pt is getting tried quickly.  Pt is is water aerobics and is taking whey protein.    Current Medications (verified): 1)  Toprol Xl 25 Mg Xr24h-Tab (Metoprolol Succinate) .Marland Kitchen.. 1 By Mouth Once Daily 2)  Mobic 15 Mg Tabs (Meloxicam) .Marland Kitchen.. 1 By Mouth Once Daily As Needed 3)  Crestor 5 Mg Tabs (Rosuvastatin Calcium) .... Take One Tablet Daily 4)  Tricor 145 Mg  Tabs (Fenofibrate) .... Once Daily 5)  Lyrica 75 Mg  Caps (Pregabalin) .... Two Times A Day 6)  Topamax 100 Mg  Tabs (Topiramate) .... At Bedtime 7)  Zegerid 40-1100 Mg  Caps (Omeprazole-Sodium Bicarbonate) .Marland Kitchen.. 1 Tab At Bedtime 8)  Maxalt 10 Mg Tabs (Rizatriptan Benzoate) 9)  Glucosamine-Chondroitin  Caps (Glucosamine-Chondroit-Vit C-Mn) .... Take 1 Tablet Daily 10)  Multivitamins  Tabs (Multiple Vitamin) .... Take 1 Tablet Daily 11)  Ativan 0.5 Mg Tabs (Lorazepam) .Marland Kitchen.. 1 By Mouth Three Times A Day As Needed 12)  Fosamax 70 Mg Tabs (Alendronate Sodium) .... Take One Tablet Weekly  Allergies: 1)  ! Codeine 2)  ! Hydrocodone 3)  ! Percodan (Oxycodone-Aspirin)  Past History:  Past medical, surgical, family and social histories (including risk factors) reviewed for relevance to current acute and chronic problems.  Past Medical History: Reviewed history from 01/27/2009 and no changes required. GERD Hypertension Osteoarthritis Osteoporosis Parkinson's ? Blind Rt eye (almost) Glucose  Intolerance Diverticulitis, hx of  Past Surgical History: Reviewed history from 08/28/2006 and no changes required. Rt knee arthro catcuacts Bone tumor Rt arm T&A TAH BSO Lt foot fx-12/2005  Family History: Reviewed history from 04/07/2007 and no changes required. Daughter--- cancer of lymph nodes f-- stomach ca Family History of Stroke F 1st degree relative <60 maternal side--- heart dz and strokes S-- Raynaud's scleroderma  Social History: Reviewed history from 04/07/2007 and no changes required. 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 Lungs:  Normal respiratory effort, chest expands symmetrically. Lungs are clear to auscultation, no crackles or wheezes. Heart:  normal rate and no murmur.   Extremities:  No clubbing, cyanosis, edema, or deformity noted with normal full range of motion of all joints.   Psych:  Cognition and judgment appear intact. Alert and cooperative with normal attention span and concentration. No apparent delusions, illusions, hallucinations   Impression & Recommendations:  Problem # 1:  FATIGUE (ICD-780.79)  Orders: Venipuncture (16109) TLB-B12 + Folate Pnl (60454_09811-B14/NWG) TLB-Lipid Panel (80061-LIPID) TLB-BMP (Basic Metabolic Panel-BMET) (80048-METABOL) TLB-CBC Platelet - w/Differential (85025-CBCD) TLB-Hepatic/Liver Function Pnl (80076-HEPATIC) TLB-TSH (Thyroid Stimulating Hormone) (84443-TSH) TLB-IBC Pnl (Iron/FE;Transferrin) (83550-IBC) TLB-Ferritin (  82728-FER) T-Vitamin D (25-Hydroxy) [phone])  Problem # 2:  SHINGLES, HX OF (ICD-V13.8)  Problem # 3:  MIXED HYPERLIPIDEMIA (ICD-272.2)  Her updated medication list for this problem includes:    Crestor 5 Mg Tabs  (Rosuvastatin calcium) .Marland Kitchen... Take one tablet daily    Tricor 145 Mg Tabs (Fenofibrate) ..... Once daily  Orders: Venipuncture (91478) TLB-B12 + Folate Pnl (29562_13086-V78/ION) TLB-Lipid Panel (80061-LIPID) TLB-BMP (Basic Metabolic Panel-BMET) (80048-METABOL) TLB-CBC Platelet - w/Differential (85025-CBCD) TLB-Hepatic/Liver Function Pnl (80076-HEPATIC) TLB-TSH (Thyroid Stimulating Hormone) (84443-TSH) TLB-IBC Pnl (Iron/FE;Transferrin) (83550-IBC) TLB-Ferritin (82728-FER) T-Vitamin D (25-Hydroxy) (62952-84132)  Problem # 4:  OSTEOPOROSIS (ICD-733.00)  The following medications were removed from the medication list:    Vitamin D 44010 Unit Caps (Ergocalciferol) .Marland Kitchen... Take one caplet weekly. Her updated medication list for this problem includes:    Fosamax 70 Mg Tabs (Alendronate sodium) .Marland Kitchen... Take one tablet weekly  Orders: Venipuncture (27253) TLB-B12 + Folate Pnl (66440_34742-V95/GLO) TLB-Lipid Panel (80061-LIPID) TLB-BMP (Basic Metabolic Panel-BMET) (80048-METABOL) TLB-CBC Platelet - w/Differential (85025-CBCD) TLB-Hepatic/Liver Function Pnl (80076-HEPATIC) TLB-TSH (Thyroid Stimulating Hormone) (84443-TSH) TLB-IBC Pnl (Iron/FE;Transferrin) (83550-IBC) TLB-Ferritin (82728-FER) T-Vitamin D (25-Hydroxy) (75643-32951)  Problem # 5:  HYPERTENSION (ICD-401.9)  Her updated medication list for this problem includes:    Toprol Xl 25 Mg Xr24h-tab (Metoprolol succinate) .Marland Kitchen... 1 by mouth once daily  Orders: Venipuncture (88416) TLB-B12 + Folate Pnl (60630_16010-X32/TFT) TLB-Lipid Panel (80061-LIPID) TLB-BMP (Basic Metabolic Panel-BMET) (80048-METABOL) TLB-CBC Platelet - w/Differential (85025-CBCD) TLB-Hepatic/Liver Function Pnl (80076-HEPATIC) TLB-TSH (Thyroid Stimulating Hormone) (84443-TSH) TLB-IBC Pnl (Iron/FE;Transferrin) (83550-IBC) TLB-Ferritin (82728-FER) T-Vitamin D (25-Hydroxy) (73220-25427)  Complete Medication List: 1)  Toprol Xl 25 Mg Xr24h-tab (Metoprolol  succinate) .Marland Kitchen.. 1 by mouth once daily 2)  Mobic 15 Mg Tabs (Meloxicam) .Marland Kitchen.. 1 by mouth once daily as needed 3)  Crestor 5 Mg Tabs (Rosuvastatin calcium) .... Take one tablet daily 4)  Tricor 145 Mg Tabs (Fenofibrate) .... Once daily 5)  Lyrica 75 Mg Caps (Pregabalin) .... Two times a day 6)  Topamax 100 Mg Tabs (Topiramate) .... At bedtime 7)  Zegerid 40-1100 Mg Caps (Omeprazole-sodium bicarbonate) .Marland Kitchen.. 1 tab at bedtime 8)  Maxalt 10 Mg Tabs (Rizatriptan benzoate) 9)  Glucosamine-chondroitin Caps (Glucosamine-chondroit-vit c-mn) .... Take 1 tablet daily 10)  Multivitamins Tabs (Multiple vitamin) .... Take 1 tablet daily 11)  Ativan 0.5 Mg Tabs (Lorazepam) .Marland Kitchen.. 1 by mouth three times a day as needed 12)  Fosamax 70 Mg Tabs (Alendronate sodium) .... Take one tablet weekly

## 2010-04-17 NOTE — Assessment & Plan Note (Signed)
Summary: BLADDER PROBLEMS//KN   Vital Signs:  Patient profile:   75 year old female Height:      65 inches Weight:      176 pounds Temp:     98.2 degrees F oral Pulse rate:   64 / minute BP sitting:   118 / 70  (left arm)  Vitals Entered By: Jeremy Johann CMA (September 25, 2009 9:49 AM) CC: BURNING WITH URINATION, Dysuria Comments REVIEWED MED LIST, PATIENT AGREED DOSE AND INSTRUCTION CORRECT   History of Present Illness:  Dysuria      This is a 75 year old woman who presents with Dysuria.  The symptoms began 2 months ago.   The patient complains of burning with urination and vaginal itching, but denies urinary frequency, urgency, hematuria, vaginal discharge, vaginal sores, and penile discharge.  Associated symptoms include abdominal pain.  The patient denies the following associated symptoms: nausea, vomiting, fever, shaking chills, flank pain, back pain, pelvic pain, and arthralgias.  The patient denies the following risk factors: diabetes, prior antibiotics, immunosuppression, history of GU anomaly, history of pyelonephritis, pregnancy, history of STD, and analgesic abuse.    Current Medications (verified): 1)  Toprol Xl 25 Mg Xr24h-Tab (Metoprolol Succinate) .Marland Kitchen.. 1 By Mouth Once Daily 2)  Mobic 15 Mg Tabs (Meloxicam) .Marland Kitchen.. 1 By Mouth Once Daily As Needed 3)  Crestor 5 Mg Tabs (Rosuvastatin Calcium) .... Take One Tablet Daily 4)  Tricor 145 Mg  Tabs (Fenofibrate) .... Once Daily 5)  Lyrica 75 Mg  Caps (Pregabalin) .... Two Times A Day 6)  Topamax 100 Mg  Tabs (Topiramate) .... At Bedtime 7)  Zegerid 40-1100 Mg  Caps (Omeprazole-Sodium Bicarbonate) .Marland Kitchen.. 1 Tab At Bedtime 8)  Maxalt 10 Mg Tabs (Rizatriptan Benzoate) 9)  Glucosamine-Chondroitin  Caps (Glucosamine-Chondroit-Vit C-Mn) .... Take 1 Tablet Daily 10)  Multivitamins  Tabs (Multiple Vitamin) .... Take 1 Tablet Daily 11)  Ativan 0.5 Mg Tabs (Lorazepam) .Marland Kitchen.. 1 By Mouth Three Times A Day As Needed 12)  Fosamax 70 Mg Tabs  (Alendronate Sodium) .... Take One Tablet Weekly 13)  Lotrisone 1-0.05 % Crea (Clotrimazole-Betamethasone) .... Apply Two Times A Day  Allergies: 1)  ! Codeine 2)  ! Hydrocodone 3)  ! Percodan (Oxycodone-Aspirin)  Past History:  Past medical, surgical, family and social histories (including risk factors) reviewed for relevance to current acute and chronic problems.  Past Medical History: Reviewed history from 01/27/2009 and no changes required. GERD Hypertension Osteoarthritis Osteoporosis Parkinson's ? Blind Rt eye (almost) Glucose Intolerance Diverticulitis, hx of  Past Surgical History: Reviewed history from 08/28/2006 and no changes required. Rt knee arthro catcuacts Bone tumor Rt arm T&A TAH BSO Lt foot fx-12/2005  Family History: Reviewed history from 04/07/2007 and no changes required. Daughter--- cancer of lymph nodes f-- stomach ca Family History of Stroke F 1st degree relative <60 maternal side--- heart dz and strokes S-- Raynaud's scleroderma  Social History: Reviewed history from 04/07/2007 and no changes required. 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 Abdomen:  Bowel sounds positive,abdomen soft and non-tender without masses, organomegaly or hernias noted. Genitalia:  + ext errythema no d/c Psych:  Oriented X3 and normally interactive.     Impression & Recommendations:  Problem # 1:  CANDIDIASIS, VULVAR (ICD-112.1)  Her updated medication list for this  problem includes:    Lotrisone 1-0.05 % Crea (Clotrimazole-betamethasone) .Marland Kitchen... Apply two times a day  Discussed treatment regimen and preventive measures.   Orders: UA Dipstick w/o Micro (manual) (24401)  Problem # 2:  DYSURIA  (ICD-788.1)  Orders: T-Culture, Urine (02725-36644) UA Dipstick w/o Micro (manual) (03474)  Complete Medication List: 1)  Toprol Xl 25 Mg Xr24h-tab (Metoprolol succinate) .Marland Kitchen.. 1 by mouth once daily 2)  Mobic 15 Mg Tabs (Meloxicam) .Marland Kitchen.. 1 by mouth once daily as needed 3)  Crestor 5 Mg Tabs (Rosuvastatin calcium) .... Take one tablet daily 4)  Tricor 145 Mg Tabs (Fenofibrate) .... Once daily 5)  Lyrica 75 Mg Caps (Pregabalin) .... Two times a day 6)  Topamax 100 Mg Tabs (Topiramate) .... At bedtime 7)  Zegerid 40-1100 Mg Caps (Omeprazole-sodium bicarbonate) .Marland Kitchen.. 1 tab at bedtime 8)  Maxalt 10 Mg Tabs (Rizatriptan benzoate) 9)  Glucosamine-chondroitin Caps (Glucosamine-chondroit-vit c-mn) .... Take 1 tablet daily 10)  Multivitamins Tabs (Multiple vitamin) .... Take 1 tablet daily 11)  Ativan 0.5 Mg Tabs (Lorazepam) .Marland Kitchen.. 1 by mouth three times a day as needed 12)  Fosamax 70 Mg Tabs (Alendronate sodium) .... Take one tablet weekly 13)  Lotrisone 1-0.05 % Crea (Clotrimazole-betamethasone) .... Apply two times a day Prescriptions: LOTRISONE 1-0.05 % CREA (CLOTRIMAZOLE-BETAMETHASONE) apply two times a day  #30g x 1   Entered and Authorized by:   Loreen Freud DO   Signed by:   Loreen Freud DO on 09/25/2009   Method used:   Electronically to        [facility] # [phone]* (retail)       [address] [place], [place]  [address]       Ph: [phone]       Fax: [phone]   RxID:   [id number]   Laboratory Results   Urine Tests   Date/Time Reported: September 25, 2009 10:30 AM  Routine Urinalysis   Color: yellow Appearance: Clear Glucose: negative   (Normal Range: Negative) Bilirubin: negative   (Normal Range: Negative) Ketone: negative   (Normal Range: Negative) Spec. Gravity: <1.005   (Normal Range: 1.003-1.035) Blood: negative   (Normal Range: Negative) pH: 7.0   (Normal Range: 5.0-8.0) Protein: negative   (Normal Range: Negative) Urobilinogen: 0.2   (Normal  Range: 0-1) Nitrite: negative   (Normal Range: Negative) Leukocyte Esterace: negative   (Normal Range: Negative)

## 2010-04-17 NOTE — Progress Notes (Signed)
Summary: Refill Request  Phone Note Refill Request Message from:  [facility] Fax # [phone]  Refills Requested: Medication #1:  Metoprolol SUCC ER 25 mg Tab take 1 tablet by mouth every day   Dosage confirmed as above?Dosage Confirmed   Last Refilled: 01/16/2009 Initial call taken by: Harold Barban,  April 17, 2009 10:43 AM  Follow-up for Phone Call        done. see previous phone note. Army Fossa CMA  April 17, 2009 10:48 AM

## 2010-04-17 NOTE — Progress Notes (Signed)
Summary: bone density test   Phone Note Outgoing Call   Call placed by: Doristine Devoid,  June 14, 2009 11:14 AM Call placed to: Patient Summary of Call: check vita D level---dx osteopenia start fosamax 70 mg #4  1 by mouth weekly---11 refills  Follow-up for Phone Call        spoke w/ patient aware of bone density results says she just had eye surgery yesterday at [facility] and is still recovering from shingles which has been going on for 4 weeks informed that will can check labs once she is better.......Marland KitchenDoristine Devoid  June 14, 2009 11:18 AM     New/Updated Medications: FOSAMAX 70 MG TABS (ALENDRONATE SODIUM) take one tablet weekly Prescriptions: FOSAMAX 70 MG TABS (ALENDRONATE SODIUM) take one tablet weekly  #4 x 11   Entered by:   Doristine Devoid   Authorized by:   Loreen Freud DO   Signed by:   Doristine Devoid on 06/14/2009   Method used:   Electronically to        [facility] # [phone]* (retail)       [address] [place], [place]  [address]       Ph: [phone]       Fax: [phone]   RxID:   [id number]   Appended Document: bone density test  no problem

## 2010-04-17 NOTE — Progress Notes (Signed)
Summary: Lab Results  Phone Note Outgoing Call Call back at Home Phone [phone]   Call placed by: Army Fossa CMA,  Jul 18, 2009 9:53 AM Reason for Call: Discuss lab or test results Summary of Call: Regarding lab results, LMTCB:  great!  b12 low normal--- may benefit from b12 injections weekly x4 then monthly---recheck 1 month Signed by Loreen Freud DO on 07/17/2009 at 5:16 PM   Follow-up for Phone Call        pt aware appt schedule............Marland KitchenFelecia Deloach CMA  Jul 18, 2009 1:22 PM

## 2010-04-17 NOTE — Assessment & Plan Note (Signed)
Summary: b12 inj/cbs   Nurse Visit   Allergies: 1)  ! Codeine 2)  ! Hydrocodone 3)  ! Percodan (Oxycodone-Aspirin)  Medication Administration  Injection # 1:    Medication: Vit B12 1000 mcg    Diagnosis: ANEMIA, B12 DEFICIENCY (ICD-281.1)    Route: IM    Site: R deltoid    Exp Date: 04/2011    Lot #: [id number]    Mfr: [employer]    Patient tolerated injection without complications    Given by: Army Fossa CMA (Jul 27, 2009 10:37 AM)  Orders Added: 1)  Admin of Therapeutic Inj  intramuscular or subcutaneous [96372] 2)  Vit B12 1000 mcg [J3420]

## 2010-04-17 NOTE — Progress Notes (Signed)
Summary: Checking on pt- fYI  Phone Note Call from Patient Call back at Work Phone [phone]   Summary of Call: I spoke with pt and she states that the pain from the eye surgery is better but,  that the pain associated with her shingles is still bad. Army Fossa CMA  June 14, 2009 1:45 PM   Follow-up for Phone Call         She should call whoever is treating the shingles if she is in that much pain. Follow-up by: Loreen Freud DO,  June 14, 2009 1:50 PM  Additional Follow-up for Phone Call Additional follow up Details #1::        Pt is aware. Army Fossa CMA  June 14, 2009 1:53 PM

## 2010-04-17 NOTE — Progress Notes (Signed)
Summary: Cream refill  Phone Note Refill Request   Refills Requested: Medication #1:  CLOTRIMAZOLE/BETAMETH CREAM Initial call taken by: Lucious Groves CMA,  November 02, 2009 11:48 AM    New/Updated Medications: CLOTRIMAZOLE-BETAMETHASONE 1-0.05 % CREA (CLOTRIMAZOLE-BETAMETHASONE) apply to affected area bid Prescriptions: CLOTRIMAZOLE-BETAMETHASONE 1-0.05 % CREA (CLOTRIMAZOLE-BETAMETHASONE) apply to affected area bid  #45 gram x 0   Entered by:   Lucious Groves CMA   Authorized by:   Loreen Freud DO   Signed by:   Lucious Groves CMA on 11/02/2009   Method used:   Electronically to        [facility] # [phone]* (retail)       [address] [place], [place]  [address]       Ph: [phone]       Fax: [phone]   RxID:   [id number] CLOTRIMAZOLE-BETAMETHASONE 1-0.05 % CREA (CLOTRIMAZOLE-BETAMETHASONE) apply to affected area bid  #45 gram x 0   Entered by:   Lucious Groves CMA   Authorized by:   Loreen Freud DO   Signed by:   Lucious Groves CMA on 11/02/2009   Method used:   Electronically to        [facility] [phone]* (retail)       [address]       [place], [place]  [address]       Ph: [phone]       Fax: [phone]   RxID:   [id number]

## 2010-04-17 NOTE — Progress Notes (Signed)
Summary: REFILL  Phone Note Refill Request Message from:  Fax from Pharmacy on May 03, 2009 3:56 PM  Refills Requested: Medication #1:  ATIVAN 0.5 MG TABS 1 by mouth three times a day as needed.  Method Requested: Fax to Local Pharmacy Next Appointment Scheduled: NO APPT Initial call taken by: Barb Merino,  May 03, 2009 3:57 PM  Follow-up for Phone Call        last ov and last filled- 01/27/09. Army Fossa CMA  May 04, 2009 7:55 AM   Additional Follow-up for Phone Call Additional follow up Details #1::        ok to refill x1--2 refills Additional Follow-up by: Loreen Freud DO,  May 04, 2009 9:36 AM    Prescriptions: ATIVAN 0.5 MG TABS (LORAZEPAM) 1 by mouth three times a day as needed  #60 x 2   Entered by:   Army Fossa CMA   Authorized by:   Loreen Freud DO   Signed by:   Army Fossa CMA on 05/04/2009   Method used:   Printed then faxed to ...       [facility] [phone]* (retail)       [address]       [place], [place]  [address]       Ph: [phone]       Fax: [phone]   RxID:   [id number]

## 2010-04-17 NOTE — Progress Notes (Signed)
Summary: Refill Request  Phone Note Refill Request Message from:  [facility]ax # [phone]  Refills Requested: Medication #1:  omeprazole-bicarb 40-1,100 cap   Dosage confirmed as above?Dosage Confirmed   Last Refilled: 01/09/2009 2nd request due to weekend. Thanks!  Initial call taken by: Harold Barban,  April 17, 2009 10:31 AM  Follow-up for Phone Call        done. see previous phone note.  Follow-up by: Army Fossa CMA,  April 17, 2009 10:38 AM

## 2010-04-17 NOTE — Progress Notes (Signed)
Summary: refill  Phone Note Refill Request Message from:  [facility] fax [phone]  omeprazole-bicarb 40-1,100 cap  Initial call taken by: Barb Merino,  April 17, 2009 8:47 AM    Prescriptions: ZEGERID 40-1100 MG  CAPS (OMEPRAZOLE-SODIUM BICARBONATE) 1 tab at bedtime  #90 x 0   Entered by:   Army Fossa CMA   Authorized by:   Loreen Freud DO   Signed by:   Army Fossa CMA on 04/17/2009   Method used:   Electronically to        [facility] # [phone]* (retail)       [address] [place], [place]  [address]       Ph: [phone]       Fax: [phone]   RxID:   [id number]

## 2010-04-17 NOTE — Assessment & Plan Note (Signed)
Summary: 3rd b-12/cbs   Nurse Visit   Allergies: 1)  ! Codeine 2)  ! Hydrocodone 3)  ! Percodan (Oxycodone-Aspirin)  Medication Administration  Injection # 1:    Medication: Vit B12 1000 mcg    Diagnosis: ANEMIA, B12 DEFICIENCY (ICD-281.1)    Route: IM    Site: R deltoid    Exp Date: 06/2011    Lot #: [id number]    Mfr: [employer]    Patient tolerated injection without complications    Given by: Army Fossa CMA (Aug 03, 2009 11:00 AM)  Orders Added: 1)  Admin of Therapeutic Inj  intramuscular or subcutaneous [96372] 2)  Vit B12 1000 mcg [J3420]

## 2010-04-17 NOTE — Letter (Signed)
Summary: [facility]  [facility]   Imported By: Lanelle Bal 08/23/2009 11:27:16  _____________________________________________________________________  External Attachment:    Type:   Image     Comment:   External Document

## 2010-04-17 NOTE — Assessment & Plan Note (Signed)
Summary: b-12/cbs   Nurse Visit   Allergies: 1)  ! Codeine 2)  ! Hydrocodone 3)  ! Percodan (Oxycodone-Aspirin)  Medication Administration  Injection # 1:    Medication: Vit B12 1000 mcg    Diagnosis: ANEMIA, B12 DEFICIENCY (ICD-281.1)    Route: IM    Site: L deltoid    Exp Date: 06/2011    Lot #: [id number]    Mfr: [employer]    Patient tolerated injection without complications    Given by: Army Fossa CMA (Aug 10, 2009 10:50 AM)  Orders Added: 1)  Vit B12 1000 mcg [J3420] 2)  Admin of Therapeutic Inj  intramuscular or subcutaneous [29562]

## 2010-04-17 NOTE — Progress Notes (Signed)
Summary: refill  Phone Note Refill Request Message from:  [facility] [address] fax [phone]  Refills Requested: Medication #1:  TOPROL XL 25 MG XR24H-TAB 1 by mouth once daily Initial call taken by: Barb Merino,  April 17, 2009 9:51 AM    Prescriptions: TOPROL XL 25 MG XR24H-TAB (METOPROLOL SUCCINATE) 1 by mouth once daily  #90 x 1   Entered by:   Army Fossa CMA   Authorized by:   Loreen Freud DO   Signed by:   Army Fossa CMA on 04/17/2009   Method used:   Electronically to        [facility] # [phone]* (retail)       [address] [place], [place]  [address]       Ph: [phone]       Fax: [phone]   RxID:   [id number]

## 2010-04-17 NOTE — Progress Notes (Signed)
Summary: REFILL REQUEST  Phone Note Refill Request Call back at [phone] Message from:  Pharmacy on September 20, 2009 8:47 AM  Refills Requested: Medication #1:  CRESTOR 5 MG TABS Take one tablet daily   Dosage confirmed as above?Dosage Confirmed   Supply Requested: 3 months   Last Refilled: 07/24/2009   Notes: CAN YOU SEND IN AN RX FOR 90 DAYS FOR INSURANCE TO PAY? [facility]  Next Appointment Scheduled: JULY 11TH 2011 Initial call taken by: Lavell Islam,  September 20, 2009 8:49 AM    Prescriptions: CRESTOR 5 MG TABS (ROSUVASTATIN CALCIUM) Take one tablet daily  #90 x 0   Entered by:   Jeremy Johann CMA   Authorized by:   Loreen Freud DO   Signed by:   Jeremy Johann CMA on 09/20/2009   Method used:   Faxed to ...       [facility] [address] (retail)       [address] [place], [place]  [address]       Ph: [phone]       Fax: [phone]   RxID:   [id number]

## 2010-04-17 NOTE — Assessment & Plan Note (Signed)
Summary: 1st b12 inj//fd   Nurse Visit   Allergies: 1)  ! Codeine 2)  ! Hydrocodone 3)  ! Percodan (Oxycodone-Aspirin)  Medication Administration  Injection # 1:    Medication: Vit B12 1000 mcg    Diagnosis: ANEMIA, B12 DEFICIENCY (ICD-281.1)    Route: IM    Site: L deltoid    Exp Date: 04/19/2011    Lot #: [id number]    Mfr: [employer] Regent    Patient tolerated injection without complications    Given by: Jeremy Johann CMA (Jul 19, 2009 11:04 AM)  Orders Added: 1)  Vit B12 1000 mcg [J3420] 2)  Admin of Therapeutic Inj  intramuscular or subcutaneous [16606]

## 2010-04-17 NOTE — Letter (Signed)
Summary: [facility]  [facility]   Imported By: Lanelle Bal 07/25/2009 07:57:35  _____________________________________________________________________  External Attachment:    Type:   Image     Comment:   External Document

## 2010-04-20 NOTE — Procedures (Signed)
Summary: Endoscopy / [facility]  Endoscopy / [facility]   Imported By: Lennie Odor 01/12/2010 12:32:03  _____________________________________________________________________  External Attachment:    Type:   Image     Comment:   External Document

## 2010-05-25 DIAGNOSIS — R413 Other amnesia: Secondary | ICD-10-CM

## 2010-07-31 NOTE — Assessment & Plan Note (Signed)
[facility]                               LIPID CLINIC NOTE   Krystal, Clarke                    MRN:          [id number]  DATE:11/06/2006                            DOB:          1936/02/03    PAST MEDICAL HISTORY:  1. Hyperlipidemia.  2. Osteoarthritis.  3. Obesity.  4. Hypertension.  5. Gastroesophageal reflux disease.   CURRENT MEDICATIONS:  1. Crestor 5 mg daily.  2. Tricor 145 mg daily.  3. Elavil 25 mg daily.  4. Maxzide 25 mg as needed for leg swelling.  5. Mobic 7.5 mg daily.  6. Zegerid 40 mg daily.  7. Toprol 25 mg daily.  8. Topamax 25 mg twice daily.  9. Maxalt as needed for migraines.  10.Lidoderm patches as needed.  11.Lyrica 75 mg twice daily.  12.Glaucoma eye drops.   VITAL SIGNS:  Weight 192 pounds, blood pressure 138/78, heart rate 84.   LABORATORY DATA:  Total cholesterol 161, triglyceride 100, HDL 47, LDL  94.  LFT is within normal limits.   ASSESSMENT:  Krystal Clarke is a very pleasant and jovial 75-year-  old woman who returns to lipid clinic today with no chest pain, no  shortness of breath but complaining of some pain in her right leg.  She  says it is definitely more one-sided.  She is walking with a cane.  She  has some degenerative joint disease and some pain in her right knee  where she gets steroid injections by her orthopedic surgeons.  She also  has some degenerative joint disease in her spine where she is going to  be seeing a neurosurgeon for some spinal epidurals or other injections.  She had been complaining of some pain in her right leg or muscle  weakness to family member who is a retired Engineer, civil (consulting).  This person said that  it was the Crestor that was causing her pain and therefore Krystal Clarke  had decreased her Crestor from daily to twice a week.  She does say that  her pain and weakness in her right leg has improved, however this is  also status post steroid injection in her knee.  I am  inclined to think  that her joint pain and pain in her legs is secondary to the  degenerative joint disease both in her back and in her knee and is not  from her stating therapy.  She is agreeable to restarting statin therapy  daily for 3 months.  We will check a total CK at that time and get  patient report and will make adjustments that will be needed at that  time.  Her total cholesterol is at goal of less than 200, her  triglycerides are at goal of less than 150, her HDL is at goal of  greater than 40 and her LDL is at goal of less than 100.  She states  that she follows a Weight Watcher's diet although she is not actively  going to the meetings.  She does water aerobics for exercise because of  her degenerative joint disease she is unable  to walk or ride a bike and  she will continue to follow these lifestyle modifications she initiated  several years ago.  I think it would be very beneficial to Krystal Clarke  to lose some weight and to be more strict about a low-fat diet, however  she does like to go to luncheon functions and parties with her friends  where she eats high fat content foods and desserts.  I have encouraged  her to decrease the amount of fats in her diet although I know this will  be a difficult challenge for her.   PLAN:  1. Restart Crestor 50 mg daily.  2. Followup visit in 3 months for lipid panel and LFTs.  3. Continue lifestyle modification.      Krystal Clarke, PharmD  Electronically Signed      Jesse Sans. Daleen Squibb, MD, [facility]  Electronically Signed   LC/MedQ  DD: 11/06/2006  DT: 11/07/2006  Job #: [id number]

## 2010-07-31 NOTE — Assessment & Plan Note (Signed)
[facility]                               LIPID CLINIC NOTE   OLUBUNMI, ROTHENBERGER                    MRN:          [id number]  DATE:02/05/2007                            DOB:          [birth year]    Ms. Cass comes in today for management of her hyperlipidemia therapy  which includes Crestor 5 mg daily and Tricor 145 mg daily. She has been  tolerating these just fine and has been compliant. She continues to have  the same level and lift of right leg weakness. Her other medications  include: Toprol XL, multivitamin, Xalatan, Topamax, Atenolol, Zegerid,  and Lotemax.   VITAL SIGNS:  Total weight of 186, blood pressure 118/72, heart rate 78.   LABORATORY DATA:  Total cholesterol 140, triglycerides 146, HDL 43.8,  LDL 67. Liver function tests were within normal limits. Creatine kinase  is 78.   ASSESSMENT:  Ms. Argabright has been tolerating her current therapy and her  triglycerides, HDL, and LDL are all at goal. CK is normal. The right leg  weakness pain does not seem to be related to her cholesterol therapy.   PLAN:  We are going to continue the same medications. I encouraged her  to continue with her heart healthy diet which includes portions of  broiled fish 1 to 2 times per week, and to continue her regimen of water  aerobics. We are going to see her back in six months and get a repeat  liver and lipid panel at that time.      Charolotte Eke, PharmD  Electronically Signed      Rollene Rotunda, MD, [facility]  Electronically Signed   TP/MedQ  DD: 02/05/2007  DT: 02/06/2007  Job #: [id number]

## 2010-07-31 NOTE — Assessment & Plan Note (Signed)
[facility]                               LIPID CLINIC NOTE   Krystal Clarke, Krystal Clarke                    MRN:          [id number]  DATE:07/23/2007                            DOB:          1935/06/17    HISTORY OF PRESENT ILLNESS:  Krystal Clarke comes in today for follow-up of  her hyperlipidemia therapy which includes Crestor 5 mg daily and Tricor  145 mg daily.  She has been compliant with both these therapies and has  been tolerating them just fine with no new muscle aches or pains to  report.  However, she has had some bilateral leg weakness which has been  followed with Dr. Laury Axon, her primary care doctor.  She has been having  to use her cane more lately.   OTHER MEDICATIONS:  Toprol XL, multivitamin, Cosopt, Topamax and  Zegerid.   PHYSICAL EXAMINATION:  VITAL SIGNS:  Her weight is down 13 pounds to 173  pounds, blood pressure is 116/65, heart rate is 80.   LABORATORY DATA:  Includes total cholesterol 135, triglycerides 134, HDL  34.1, LDL 74.  Liver function tests within normal limits   ASSESSMENT:  Krystal Clarke cholesterol is at goal except for HDL has  dipped below the goal of greater than 40.  She has been exercising a  little bit less lately due to the weakness in her legs.  There is no  associated muscle aches though.  Reportedly, this is attributed to some  sort of viral illness per her primary care doctor, and she is following  up on that with her.  She continues to follow heart healthy diet and  does water aerobics on a regular basis, although she has been doing this  less as of late.  Her weight is down 13 pounds from November 2008.  I  congratulated her on this.   PLAN:  We are going to continue the same medications.  I encouraged her  to continue with diet and exercise plans.  I hope that her leg weakness  improves.  I instructed her to call us if there is any associated muscle  aches.  Prescriptions were given for 3 months  supplied, and she will see  [place] back in 6 months with repeat lipid and liver panel.      Krystal Clarke, PharmD  Electronically Signed      Krystal Rotunda, MD, [facility]  Electronically Signed   TP/MedQ  DD: 07/23/2007  DT: 07/23/2007  Job #: [id number]

## 2010-07-31 NOTE — Assessment & Plan Note (Signed)
[facility]                               LIPID CLINIC NOTE   WINSTON, SOBCZYK                    MRN:          [id number]  DATE:01/21/2008                            DOB:          [birth year]    Ms. Stickley comes in today for followup of her hyperlipidemia therapy,  which includes TriCor 145 mg daily and Crestor 10 mg daily.  She has  been compliant with both of these and is tolerating them fine with no  aches or pains report, in fact she has been doing quite well lately and  is in good spirits.   OTHER MEDICATIONS:  Toprol-XL, multivitamin, Cosopt, Topamax, Zegerid,  Mobic, Lyrica and that she has recently started vitamin D supplements.   PHYSICAL EXAMINATION:  VITAL SIGNS:  Weight of 183 pounds, blood  pressure is 125/80, and heart rate is 68.   LABORATORY DATA:  Total cholesterol 120, triglycerides 118, HDL 40.9,  and LDL 56.  Liver function tests are within normal limits.   ASSESSMENT:  Ms. Deckard has been doing great lately, continuing with  her water aerobics on a regular basis and continuing to try to eat  right, although we did not discuss this much at this appointment.  Her  triglycerides, HDL, and LDL are all at goals.  Apparently, Dr. Laury Axon had  increased her Crestor from 5 mg a day to 10 mg daily.  Ms. 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   PLAN:  We are going to continue with Crestor 10 mg daily and the Tricor  145 mg daily.  I encouraged her to continue with her lifestyle changes  that she has made and followup with [place] was made for 6 months, and she  was instructed to call us with any concerns or any problems in the  meantime.      Charolotte Eke, PharmD  Electronically Signed      Rollene Rotunda, MD, [facility]  Electronically Signed   TP/MedQ  DD: 01/21/2008  DT: 01/21/2008  Job #: [id number]

## 2010-08-03 NOTE — H&P (Signed)
NAMEJURLINE, FOLGER NO.:  [id number]   MEDICAL RECORD NO.:  [id number]          PATIENT TYPE:  INP   LOCATION:  0356                         FACILITY:  [facility]   PHYSICIAN:  Angelena Sole, M.D. [facility] DATE OF BIRTH:  June 23, 1935   DATE OF ADMISSION:  05/29/2004  DATE OF DISCHARGE:                                HISTORY & PHYSICAL   CHIEF COMPLAINT:  Hallucinations, cough.   HISTORY OF PRESENT ILLNESS:  The patient is a 75 year old white female who  was hospitalized at [facility] approximately one year ago with periorbital  cellulitis and has been having problems with her right eye since.  Is to  have surgery at [facility] in May and was recently placed on Diamox.  Now the  past four days, the patient has had a sore throat, deep cough and has been  intermittently hallucinating where she says she will reach out for things.  One was she thought she saw a fireplace and was trying to rearrange items  around it. She has some hoarseness, no shortness of breath. She has a lot of  sweats but has not taken her temperature. She feels really bad and states  that the reason she has not come in sooner is because she feels so badly.  Possibly having some slurred speech. Daughter thought that she might have  weakness on one side but not sure what side. The patient does admit to  feeling very weak. She has a shaky tremor and was diagnosed recently by Dr.  Sandria Manly with Parkinson's. Things were pretty stable but the past several days,  the tremor has come back a lot.   PAST MEDICAL HISTORY:  Neuropathy, osteoarthritis, GERD, migrainous  seizures, glaucoma, siriasis, hypertriglycideridemia, hypertension, possible  Parkinson's. Last tetanus June 18, 2002, Pneumovax is June 18, 2002.   PAST SURGICAL HISTORY:  Right knee arthroscopy, cataract, bone tumor of the  right arm, detached retina, TNA, TAH/BSO, lumbar laminectomy.   ALLERGIES:  CODEINE, HYDROCODONE which caused nausea and vomiting and  PREDNISONE caused the patient to be unable to walk.   MEDICATIONS:  1.  Crestor 5 mg.  2.  Depakote 500 mg up to 3 tablets at bedtime.  3.  Elavil 25 mg, 1 or 2 at bedtime.  4.  Maxzide 25 mg p.r.n. swelling.  5.  Mobic 7.5 mg b.i.d. p.r.n.  6.  Nexium 40 mg daily.  7.  Toprol XL 25 mg, 3 daily.  8.  Topamax 25 mg, 3 at bedtime.  9.  Tri-Chlor 145 mg daily.  10. She has been taking over the counter cold medicines.  11. Eye drops include Alphagan P, Cosopt, Pred Forte, Xalatan, Quixin.  12. Diamox 500 mg daily which she states she has been on about a week or      two.   REVIEW OF SYMPTOMS:  HEAD:  No headache but she has been getting some  dizziness just when she gets up, no weight changes, no falls; however, she  feels somewhat off balance. EYES:  Wears glasses, has glaucoma, is legally  blind in the right eye and is due to  have surgery in May.  ENT:  As in HPI.  CARDIAC:  Negative.  RESPIRATORY:  As in HPI. GI:  Negative except for some  mild nausea with this. GU:  Negative except for some urinary incontinence  with coughing.  MUSCULOSKELETAL:  Very achy and feeling much weaker than  usual.  SKIN:  Negative.  NEUROLOGIC:  The patient is also getting lost in  the house, more confused.  Her legs are very weak having a lot more trouble  getting up.  PSYCHE:  Negative.   PHYSICAL EXAMINATION:  VITAL SIGNS:  Weight 194, temperature 98.4, pulse 84,  blood pressure 110/70, pulse ox 93%.  GENERAL:  The patient is an elderly white female who looks ill.  HEENT:  Conjunctiva are not injected, right pupil is chronically irregular  and looks like either the lid is smaller on the right or more open on the  left. Extraocular movements intact. Fundi are not visualized. Nares are not  injected, tympanic membranes are gray with good landmarks bilaterally.  Oropharynx is very dry, red, voice is hoarse. Sinuses were nontender.  NECK:  Supple, no JVD, no carotid bruits, no thyromegaly.  HEART:   Regular rate and rhythm, S1, S2 positive. No murmurs, rubs or  gallops.  LUNGS:  Some possible crackles in the bases but otherwise clear.  EXTREMITIES:  No edema. Dorsalis pedis and radial pulses 2+ bilaterally.  ABDOMEN:  Soft, nontender, nondistended, no hepatosplenomegaly.  MUSCULOSKELETAL:  The patient is very weak but able to lift arms and legs  against resistance.  SKIN:  Diaphoretic, warm.  NEUROLOGIC:  Alert and oriented x3. Cranial nerves II-XII intact. Deep  tendon reflexes are absent all four. She had an extremely hard time getting  out of the chair, had to keep rocking and pulling very hard. Gait is  somewhat wide based and shuffling.   IMPRESSION:  1.  Cough, hallucination, mental status changes, question sepsis versus      stroke versus medication reactions, etc. Will admit the patient to the      hospital, check a chest x-ray, MRI of the brain and labs.  Begin      Rocephin and Zithromax.  Will hold her cholesterol medications as they      may interfere with levels in the Zithromax.  Will consult Dr. Sandria Manly.  I      called and spoke with Dr. Cecilie Kicks and advised him of the admission.  2.  Migrainous seizures.  Will consult neurology for now and keep her on      Depakote but at just two tablets. Will check a level.  Will hold her      Elavil for now and let neuro decide whether she needs it or can be held.      AMK/MEDQ  D:  05/30/2004  T:  05/30/2004  Job:  [id number]

## 2010-08-03 NOTE — Assessment & Plan Note (Signed)
[facility]                          CHRONIC HEART FAILURE NOTE   KAYLENE, DAWN                    MRN:          [id number]  DATE:05/08/2006                            DOB:          1935-11-03    Ms. Lewing is seen back in lipid clinic.  She has been tolerating her  Crestor 5 mg daily therapy and Tricor 145 mg p.o. daily therapy without  issue.  She has had continued attention that she has paid to her diet  with Weight-Watcher type, but does not go to meetings.  She eats very  few sweets and she does continue to select very lean meats.  She  participates in water aerobics three times a week.  She has had no  problems tolerating her current therapies.   CURRENT MEDICATIONS INCLUDE:  1. Crestor 5 daily.  2. Tricor 145 daily.  3. Mobic 15 mg daily.  4. Toprol XL 25 mg daily.  5. Lyrica 75 mg twice daily.  6. Centrum multivitamin daily.   PHYSICAL EXAM:  Weight today is 192 pounds.  Blood pressure is 118/78,  heart rate is 90.   Labs obtained on April 30, 2006, reveal total cholesterol 136,  triglycerides 143, HDL 40.3, LDL 67.  LFT were within normal limits, 34  and 40 for the AST and ALT, respectively.   ASSESSMENT:  The patient will continue on her Crestor 5 and Tricor 145  mg daily.  I have provided the patient with prescriptions.  She will  continue to follow a heart healthy diet and continue her water aerobics  exercise activity.  She will see [place] back in six months and call sooner  with problems in the meantime.   Please Note:  The patient was seen by Maryln Gottron, PhamD candidate, and  will call if questions or problems in the meantime.      Shelby Dubin, PharmD, BCPS, CPP  Electronically Signed      Rollene Rotunda, MD, [facility]  Electronically Signed   MP/MedQ  DD: 05/23/2006  DT: 05/23/2006  Job #: [id number]   cc:   Madolyn Frieze. Jens Som, MD, [facility]

## 2010-08-03 NOTE — Consult Note (Signed)
NAME:  Krystal Clarke NO.:  [id number]   MEDICAL RECORD NO.:  [id number]          PATIENT TYPE:  INP   LOCATION:  0356                         FACILITY:  [facility]   PHYSICIAN:  Melvyn Novas, M.D.  DATE OF BIRTH:  November 10, 1935   DATE OF CONSULTATION:  05/29/2004  DATE OF DISCHARGE:                                   CONSULTATION   Krystal Clarke is a 75 year old patient that is known to [facility] since she has followed with Dr. Sandria Manly for a  peripheral, sensory, and motor neuropathy that supposedly is a genetic-  determined disorder.  The patient had been also diagnosed with essential  tremors and has been on two antiseizure medications, Depakote 500 mg 2-3  tablets at bedtime and uses Topamax 25 mg 3 at bedtime for migraine  prophylaxis, as she states, but I confirmed with Dr. Sandria Manly and the [facility]  medical chart, that the patient also has a history of a seizure disorder,  and these medications are also supposed to, of course, cover her for that as  well.  She has not had active seizures for over 10 years.  Last year, the  patient developed periorbital cellulitis and has been having retro-orbital  pain on the right side since.  The patient has been placed on Diamox to  decrease swelling.  She says she has had trouble with generalized weakness,  coughing, and over the last 2-3 weeks, has intermittently hallucinating.  The patient is aware of her hallucinations and describes the events as  seeing things that are not there, but she had no auditory hallucinations  component.  The patient also has a shaky tremor and states that she was  diagnosed with Parkinson's disease.  Also, Dr. Imagene Gurney chart only states  essential tremor as of his visit from last December.  The patient also  mentions that she has been diagnosed with dementia, and I cannot find any  verification in Dr. Imagene Gurney chart of that diagnosis either.  The discrepancy  between the patient's  reports, her hypomanic behavior and mental status, the  patient is giggling and seems to be moving in a very abrupt and over-  shooting fashion, all led me to believe that she might be in a delirium.   PAST MEDICAL HISTORY:  1.  Neuropathy.  2.  Osteoarthritis.  3.  GERD.  4.  Migraine.  5.  Possible seizures.  6.  Psoriasis.  7.  Hypertriglyceridemia.  8.  Hypertension.  9.  Essential tremor.   PAST SURGICAL HISTORY:  1.  Right knee arthroscopy.  2.  Cataract surgery.  The patient also states that she had a detached      retina in the past.   MEDICATIONS:  Crestor, Depakote, Elavil, Maxzide, Mobic, Nexium, Toprol,  Topamax, Tri-Chlor.  She has been taking decongestant recently, which could  have exacerbated tremor and in some patients, also works as hallucinogenic.   REVIEW OF SYSTEMS:  As described above.   PHYSICAL EXAMINATION:  VITAL SIGNS:  The patient has low blood pressure of  105/75.  Her weight currently is 195 pounds with  a height of 5 foot 4  inches.  Pulse is 82 and regular.  She is afebrile.  Pulse oximetry on room  air is currently not yet obtained.  GENERAL:  Patient is an elderly white female who looks somewhat confused,  logorrheic, and inappropriately euphoric.  NEUROLOGIC:  Mental status:  The patient describes hallucinations and is  aware that she describes hallucinations.  This would be very unusual in a  dementia patient.  This would also not normally occur in most bipolar  disease patients or patients with major depression and is rather a hallmark  of delirium.  She has here appeared alert and disoriented to time and place,  and of course, person.  She has no cranial nerve abnormalities.  No  hemilateral weakness.  Her deep tendon reflexes are absent due to her  neuropathy, and she has not had reflexes reported in previous neurology  visits.  She has downgoing toes to plantar stimulation.  She states that her  feet are numb.  She has had good grip  strength bilaterally but impaired  bilateral hip flexion with a weakness of 4/5, knee flexion and extension  4/5, and dorsiflexion and plantar flexion.  The patient has a wide-based  gait.  Antigravity and resistance movements are there but decreased  resistance strength is noted.  Sensory and numbness in both lower  extremities.  Numbness in the fingertips.  Finger-nose testing shows  intention and rest tremor to be present.   IMPRESSION:  1.  Essential tremor, according to Dr. Imagene Gurney note, at baseline.  I am not      sure if the patient's tremor has exacerbated or has a different      amplitude now.  She does appear to have a rather coarse tremor for      Parkinson's disease and one of the considerations is that the patient      might have a Depakote-induced tremor.  2.  Hallucinations, mental status changes:  Question if the patient is      hypomanic or manic or if she is in a delirium.  I called a psychiatry      consult.  3.  The patient has not had recent migraine exacerbations and has not had      any recent seizures.  I would therefore consider her Depakote and      topiramate to be at therapeutic levels for this patient.  Ammonia, liver      function tests, comprehensive metabolic panel are pending.  The patient      is supposed to have an MRI within the next 15 minutes.  If no focal      abnormality on MRI is seen, I would appreciate if the patient could      follow up with psychiatry.      CD/MEDQ  D:  05/30/2004  T:  05/30/2004  Job:  [id number]

## 2010-08-03 NOTE — Procedures (Signed)
CLINICAL HISTORY:  This patient has a history of confusion.   PROCEDURE:  This EEG was recorded during the awake and drowsy states. The  background activity shows intermixture of rhythms. There is 7-9 Hz rhythms  with higher amplitudes seen in the posterior head regions bilaterally. No  focal asymmetries were noted during this EEG. Photic stimulation and  hyperventilation testing were not performed. There was no evidence of any  focal asymmetry present.   IMPRESSION:  This is mildly abnormal electroencephalogram showing some  evidence of diffuse slowing into the upper theta range. Some of these  findings could be related to drowsiness. There was no evidence of any focal  asymmetry or epileptiform activity. The findings as noted above should be  taken in account of the clinical state of the patient. There was no evidence  of any focal asymmetry or epileptiform activity present. Thank you Dr. Genene Churn. Love      JJO:ACZY  D:  05/30/2004 20:06:42  T:  05/31/2004 01:44:00  Job #:  [id number]   cc:   Angelena Sole, M.D. [facility]   Melvyn Novas, M.D.  [address]  [address]  [place]  [place] [address]  Fax: [phone]

## 2010-08-03 NOTE — Assessment & Plan Note (Signed)
[facility]                              CARDIOLOGY OFFICE NOTE   Krystal Clarke, Krystal Clarke                    MRN:          [id number]  DATE:11/07/2005                            DOB:          [birth year]    Return office visit for lipid clinic.   PAST MEDICAL HISTORY:  1. Hyperlipidemia.  2. Obesity.  3. Hypertension.  4. Gastroesophageal reflux disease.   CURRENT MEDICATIONS:  1. Crestor 5 mg daily.  2. TriCor 145 mg daily.  3. Elavil 25 mg at bedtime.  4. Maxzide 25 mg as needed for leg swelling.  5. Mobic 7.5 mg daily.  6. Toprol 25 mg daily.  7. Zegerid 40 mg daily.  8. Topamax 100 mg at bedtime.  9. Lidoderm patch for pain.  10.Timolol eye drops.  11.Dilantin eye drops.  12.Lyrica 75 mg once daily.   VITAL SIGNS:  Weight 197 pounds, blood pressure 122/70, heart rate 70.   LABORATORY DATA:  Total cholesterol 136, triglycerides 153, HDL 32, LDL 73.  LFTs within normal limits.   ASSESSMENT:  Krystal Clarke is a very pleasant, energetic woman who returns to  lipid clinic today with no complaints, no chest pain, no shortness of  breath, no muscle aches or pain.  She has just recently had eye surgery to  her good eye.  She was unable to drive and be out and about for the last two  months.  She had to stay in a reclining chair with very little movement  except to get up to the table to eat.  She is now in much better spirits.  She is improving and is able to be out and has her driving privileges back.  She has just restarted her water aerobics 4x a week.  She has also decreased  the amount of sweets that she is eating.  She states she eats lots of  vegetables, very little chips, but she does eat a fair amount of  carbohydrates with her meals and occasionally fries food and does have a  protein, some type of lean chicken or pork or beef with her meals on a daily  basis.  She had previously been following Weight Watchers, but she is not  currently enrolled at this time.  At this visit, her total cholesterol is at  goal of less than 200.  Triglycerides are improved from last visit but  remain above goal of less than 150.  Her HDL is less than goal of greater  than 40, and her LDL is at goal of less than 100.  I would imagine that her  sedentary lifestyle over the last 2-3 months is what has caused her  elevation in her triglycerides and her decrease in her HDL.  Now that she is  restarting her exercise program, I would expect to see those become within  limits at next visit.   PLAN:  1. Continue Crestor 5 mg and TriCor 145 mg.  2. Restart exercise program.  Have encouraged her to continue 3-4x a week      with her water aerobics.  3. Low  fat, low carbohydrate diet.  4. Followup visit in 6 months for lipid panel and LFTs and make any      changes that will be needed at that time.                                  Leota Sauers, PharmD                            Krystal Sans. Daleen Squibb, MD, [facility]   LC/MedQ  DD:  11/07/2005  DT:  11/07/2005  Job #:  [id number]

## 2010-08-03 NOTE — Assessment & Plan Note (Signed)
[facility]                               LIPID CLINIC NOTE   MARABELLA, POPIEL                    MRN:          [id number]  DATE:05/08/2006                            DOB:          [birth year]    Krystal Clarke comes in today with no acute complaints.  She has been  compliant with her current cholesterol medications, which include:  1. Crestor 5 mg daily.  2. Tricor 145 mg daily.   She denies any muscle aches and pains.   Her other medications include Mobic, metoprolol ER, Lyrica,  multivitamin, Topamax, Zegerid, amitriptyline, timolol, Xalatan  ophthalmic drops.   PHYSICAL EXAMINATION:  Includes weight of 192 pounds.  Blood pressure is  118/78.  Heart rate is 90.  Laboratory data includes total cholesterol of 136, triglycerides 145,  HDL 40.3, LDL 67.  Liver function tests are within normal limits.   ASSESSMENT:  Ms. Pujol is at goal for her triglycerides, HDL and LDL  on the current therapy, which she is tolerating just fine.  She  continues to do water aerobics at least 3 times a week.  She follows the  Weight Watcher diet, but she is not currently going to the meetings.  Since her last visit with [place], she has cut down on fried foods  significantly.  She has been trying to drink water.  She has cut down on  sweets and carbohydrates in general.   PLAN:  To continue Crestor 5 mg daily and Tricor 145 mg daily.  Prescriptions were given to her for both of those.  She is going to  continue with her diet and exercise program.  She is going to follow up  with [place] in 6 months with a new set of labs.   Patient was seen along Vernard Gambles, PharmD resident and Maryln Gottron,  PharmD candidate.      Charolotte Eke, PharmD  Electronically Signed      Rollene Rotunda, MD, [facility]  Electronically Signed   TP/MedQ  DD: 05/08/2006  DT: 05/08/2006  Job #: [id number]

## 2010-08-03 NOTE — Discharge Summary (Signed)
Krystal Clarke, HARTUNG NO.:  [id number]   MEDICAL RECORD NO.:  [id number]          PATIENT TYPE:  INP   LOCATION:  0356                         FACILITY:  [facility]   PHYSICIAN:  Rene Paci, M.D. LHCDATE OF BIRTH:  1935-04-27   DATE OF ADMISSION:  05/29/2004  DATE OF DISCHARGE:                                 DISCHARGE SUMMARY   DISCHARGE DIAGNOSES:  1.  Delirium with hallucinations, presumed secondary to acute sinusitis. No      other neurologic/metabolic cause identified. Question medication side      effect but recommend no changes at this time. Status post neurology,      ophthalmology, and psychiatry evaluation.  2.  Acute sinusitis right frontal region by MRI.  3.  Chronic glaucoma, for future eye surgery at [facility] in April 2006.  4.  Cough related to bronchitis. CT chest negative for airspace disease or      pulmonary embolus.  5.  Benign essential tremor versus Parkinson's per outpatient Dr. Imagene Gurney      evaluation and guidance.   DISCHARGE MEDICATIONS:  Augmentin 175 mg p.o. b.i.d. x10 days. Otherwise,  medications are as prior to admission and without change.   This currently includes:  1.  Crestor 5 mg q.d.  2.  Depakote 500 mg three tablets q.h.s.  3.  Elavil 25 mg one to two q.h.s.  4.  Maxzide 25 mg p.r.n. swelling.  5.  Mobic 7.5 mg b.i.d. p.r.n.  6.  Nexium 40 mg p.o. q.d.  7.  Toprol-XL-XL 25 mg three tablets p.o. q.d.  8.  Topamax 25 mg three  tablets p.o. q.h.s.  9.  Tricor 145 mg p.o. q.d.  10. Excedrin 150 mg two tablets p.r.n. headache  11. Alphagan one drop both eyes daily.  12. Cosopt one drop b.i.d. left eye.  13. Pred Forte one drop right eye three times daily.  14. Xalatan one drop nightly left eye.  15. Quixin drops four drops daily in right eye.  16. Diamox 500 mg daily.   DISPOSITION:  The patient is discharged home where she lives with her  husband. She is medically stable and mental status improved per patient and  family report. Hospital follow-up will be arranged by patient and family  with her primary care physician Dr. Ruthine Dose next week.  All other appointments  with ophthalmology, neurology, etc. are as prior to admission and without  changes.   CONSULTATIONS:  1.  Neurology, Dr. Vickey Huger on behalf of Dr. Sandria Manly  2.  Dr. Stormy Fabian of ophthalmology.  3.  Dr. Rico Junker of psychiatry.   TESTS:  Include:  1.  An MRI of the brain on March 14 showing acute sinusitis of the right      frontal sinus with slight leptomeningeal thickening in this area. No      other signs of meningitis, no signs of acute CVA.  2.  CT of the chest done March 15 showing no airspace disease, negative for      PE, mild fatty liver changes.   HOSPITAL COURSE BY PROBLEM.:  DELIRIUM WITH HALLUCINATIONS. The patient  is a  75 year old woman with complex medical history related to chronic glaucoma  and right periorbital cellulitis 1 year ago. Over the week prior to  admission the patient and her family had noticed slurring of speech,  confusion, question fever, cough, headaches, and worsening of her essential  tremor. She had recently been started on medications including Diamox for  her upcoming glaucoma eye surgery, as well as Topamax for question of  Parkinson's disease. Family wondered if her change in symptoms were related  to infection or new medications so she was admitted for further evaluation.  Both ophtho and neuro felt that the medications were unlikely related to her  symptoms and she was found to have evidence of acute sinusitis. She had  begun empirically on Rocephin and azithromycin. She had no white count or  fever during this hospitalization. She was changed to oral Augmentin to  continue treatment of her sinusitis. Her mental status seems to have  improved and no other changes have been recommended by neuro or ophtho. She  was evaluated by psychiatry for underlying causes and they felt that  Seroquel  could be of benefit 25 mg nightly if symptoms should persist and  are disturbing the patient and family. No other changes were made in the  patient's regimen at this time.      VL/MEDQ  D:  05/31/2004  T:  05/31/2004  Job:  [id number]

## 2010-11-07 ENCOUNTER — Encounter (HOSPITAL_COMMUNITY): Payer: Self-pay

## 2010-11-07 ENCOUNTER — Emergency Department (HOSPITAL_COMMUNITY)
Admission: EM | Admit: 2010-11-07 | Discharge: 2010-11-07 | Disposition: A | Discharge status: Home / Self Care | Payer: No Typology Code available for payment source | Attending: General Surgery | Admitting: General Surgery

## 2010-11-07 ENCOUNTER — Emergency Department (HOSPITAL_COMMUNITY): Payer: No Typology Code available for payment source

## 2010-11-07 DIAGNOSIS — M25519 Pain in unspecified shoulder: Secondary | ICD-10-CM | POA: Insufficient documentation

## 2010-11-07 DIAGNOSIS — M25529 Pain in unspecified elbow: Secondary | ICD-10-CM | POA: Insufficient documentation

## 2010-11-07 DIAGNOSIS — S8000XA Contusion of unspecified knee, initial encounter: Secondary | ICD-10-CM | POA: Insufficient documentation

## 2010-11-07 DIAGNOSIS — S5000XA Contusion of unspecified elbow, initial encounter: Secondary | ICD-10-CM | POA: Insufficient documentation

## 2010-11-07 DIAGNOSIS — S43016A Anterior dislocation of unspecified humerus, initial encounter: Secondary | ICD-10-CM | POA: Insufficient documentation

## 2010-11-07 DIAGNOSIS — S0990XA Unspecified injury of head, initial encounter: Secondary | ICD-10-CM | POA: Insufficient documentation

## 2010-11-07 DIAGNOSIS — M25569 Pain in unspecified knee: Secondary | ICD-10-CM | POA: Insufficient documentation

## 2010-11-07 DIAGNOSIS — IMO0002 Reserved for concepts with insufficient information to code with codable children: Secondary | ICD-10-CM | POA: Insufficient documentation

## 2010-11-07 HISTORY — DX: Polyneuropathy, unspecified: G62.9

## 2010-11-07 HISTORY — DX: Essential (primary) hypertension: I10

## 2010-12-31 ENCOUNTER — Other Ambulatory Visit: Payer: Self-pay | Admitting: Family Medicine

## 2010-12-31 NOTE — Telephone Encounter (Signed)
Last OC 09/2009. Pt has no existing appts. Ok to Rf?

## 2011-03-20 DIAGNOSIS — E871 Hypo-osmolality and hyponatremia: Secondary | ICD-10-CM | POA: Diagnosis not present

## 2011-03-20 DIAGNOSIS — E785 Hyperlipidemia, unspecified: Secondary | ICD-10-CM | POA: Diagnosis not present

## 2011-03-20 DIAGNOSIS — J069 Acute upper respiratory infection, unspecified: Secondary | ICD-10-CM | POA: Diagnosis not present

## 2011-03-20 DIAGNOSIS — G43909 Migraine, unspecified, not intractable, without status migrainosus: Secondary | ICD-10-CM | POA: Diagnosis not present

## 2011-04-02 DIAGNOSIS — IMO0002 Reserved for concepts with insufficient information to code with codable children: Secondary | ICD-10-CM | POA: Diagnosis not present

## 2011-04-02 DIAGNOSIS — M75100 Unspecified rotator cuff tear or rupture of unspecified shoulder, not specified as traumatic: Secondary | ICD-10-CM | POA: Diagnosis not present

## 2011-04-02 DIAGNOSIS — M719 Bursopathy, unspecified: Secondary | ICD-10-CM | POA: Diagnosis not present

## 2011-04-02 DIAGNOSIS — S46819A Strain of other muscles, fascia and tendons at shoulder and upper arm level, unspecified arm, initial encounter: Secondary | ICD-10-CM | POA: Diagnosis not present

## 2011-04-02 DIAGNOSIS — M19019 Primary osteoarthritis, unspecified shoulder: Secondary | ICD-10-CM | POA: Diagnosis not present

## 2011-04-02 DIAGNOSIS — M67919 Unspecified disorder of synovium and tendon, unspecified shoulder: Secondary | ICD-10-CM | POA: Diagnosis not present

## 2011-04-02 DIAGNOSIS — M7520 Bicipital tendinitis, unspecified shoulder: Secondary | ICD-10-CM | POA: Diagnosis not present

## 2011-04-02 DIAGNOSIS — S43499A Other sprain of unspecified shoulder joint, initial encounter: Secondary | ICD-10-CM | POA: Diagnosis not present

## 2011-04-02 DIAGNOSIS — M24819 Other specific joint derangements of unspecified shoulder, not elsewhere classified: Secondary | ICD-10-CM | POA: Diagnosis not present

## 2011-04-02 DIAGNOSIS — S43439A Superior glenoid labrum lesion of unspecified shoulder, initial encounter: Secondary | ICD-10-CM | POA: Diagnosis not present

## 2011-04-10 DIAGNOSIS — S46819A Strain of other muscles, fascia and tendons at shoulder and upper arm level, unspecified arm, initial encounter: Secondary | ICD-10-CM | POA: Diagnosis not present

## 2011-04-10 DIAGNOSIS — S43499A Other sprain of unspecified shoulder joint, initial encounter: Secondary | ICD-10-CM | POA: Diagnosis not present

## 2011-04-16 DIAGNOSIS — S43499A Other sprain of unspecified shoulder joint, initial encounter: Secondary | ICD-10-CM | POA: Diagnosis not present

## 2011-04-16 DIAGNOSIS — S46819A Strain of other muscles, fascia and tendons at shoulder and upper arm level, unspecified arm, initial encounter: Secondary | ICD-10-CM | POA: Diagnosis not present

## 2011-04-17 DIAGNOSIS — R232 Flushing: Secondary | ICD-10-CM | POA: Diagnosis not present

## 2011-04-17 DIAGNOSIS — R110 Nausea: Secondary | ICD-10-CM | POA: Diagnosis not present

## 2011-04-17 DIAGNOSIS — R58 Hemorrhage, not elsewhere classified: Secondary | ICD-10-CM | POA: Diagnosis not present

## 2011-04-18 DIAGNOSIS — S43499A Other sprain of unspecified shoulder joint, initial encounter: Secondary | ICD-10-CM | POA: Diagnosis not present

## 2011-04-18 DIAGNOSIS — S46819A Strain of other muscles, fascia and tendons at shoulder and upper arm level, unspecified arm, initial encounter: Secondary | ICD-10-CM | POA: Diagnosis not present

## 2011-04-19 DIAGNOSIS — M3500 Sicca syndrome, unspecified: Secondary | ICD-10-CM | POA: Diagnosis not present

## 2011-04-19 DIAGNOSIS — H04129 Dry eye syndrome of unspecified lacrimal gland: Secondary | ICD-10-CM | POA: Diagnosis not present

## 2011-04-23 DIAGNOSIS — S43429A Sprain of unspecified rotator cuff capsule, initial encounter: Secondary | ICD-10-CM | POA: Diagnosis not present

## 2011-04-24 DIAGNOSIS — S43429A Sprain of unspecified rotator cuff capsule, initial encounter: Secondary | ICD-10-CM | POA: Diagnosis not present

## 2011-04-25 DIAGNOSIS — S43429A Sprain of unspecified rotator cuff capsule, initial encounter: Secondary | ICD-10-CM | POA: Diagnosis not present

## 2011-04-30 DIAGNOSIS — S43429A Sprain of unspecified rotator cuff capsule, initial encounter: Secondary | ICD-10-CM | POA: Diagnosis not present

## 2011-05-02 DIAGNOSIS — S43429A Sprain of unspecified rotator cuff capsule, initial encounter: Secondary | ICD-10-CM | POA: Diagnosis not present

## 2011-05-07 DIAGNOSIS — S43429A Sprain of unspecified rotator cuff capsule, initial encounter: Secondary | ICD-10-CM | POA: Diagnosis not present

## 2011-05-09 DIAGNOSIS — S43429A Sprain of unspecified rotator cuff capsule, initial encounter: Secondary | ICD-10-CM | POA: Diagnosis not present

## 2011-05-14 DIAGNOSIS — S43429A Sprain of unspecified rotator cuff capsule, initial encounter: Secondary | ICD-10-CM | POA: Diagnosis not present

## 2011-05-16 DIAGNOSIS — S43429A Sprain of unspecified rotator cuff capsule, initial encounter: Secondary | ICD-10-CM | POA: Diagnosis not present

## 2011-05-23 ENCOUNTER — Other Ambulatory Visit: Payer: Self-pay | Admitting: Gastroenterology

## 2011-05-23 DIAGNOSIS — R110 Nausea: Secondary | ICD-10-CM | POA: Diagnosis not present

## 2011-05-23 DIAGNOSIS — R198 Other specified symptoms and signs involving the digestive system and abdomen: Secondary | ICD-10-CM | POA: Diagnosis not present

## 2011-05-23 DIAGNOSIS — K5901 Slow transit constipation: Secondary | ICD-10-CM | POA: Diagnosis not present

## 2011-05-23 DIAGNOSIS — R159 Full incontinence of feces: Secondary | ICD-10-CM | POA: Diagnosis not present

## 2011-05-27 DIAGNOSIS — S43429A Sprain of unspecified rotator cuff capsule, initial encounter: Secondary | ICD-10-CM | POA: Diagnosis not present

## 2011-05-28 ENCOUNTER — Ambulatory Visit
Admission: RE | Admit: 2011-05-28 | Discharge: 2011-05-28 | Disposition: A | Discharge status: Home / Self Care | Payer: Medicare Other | Source: Ambulatory Visit | Attending: Gastroenterology | Admitting: Gastroenterology

## 2011-05-28 DIAGNOSIS — R110 Nausea: Secondary | ICD-10-CM | POA: Diagnosis not present

## 2011-05-28 DIAGNOSIS — R109 Unspecified abdominal pain: Secondary | ICD-10-CM | POA: Diagnosis not present

## 2011-05-28 MED ORDER — IOHEXOL 300 MG/ML  SOLN
100.0000 mL | Freq: Once | INTRAMUSCULAR | Status: AC | PRN
  Administered 2011-05-28: 100 mL via INTRAVENOUS

## 2011-05-29 DIAGNOSIS — S43429A Sprain of unspecified rotator cuff capsule, initial encounter: Secondary | ICD-10-CM | POA: Diagnosis not present

## 2011-05-31 DIAGNOSIS — M3500 Sicca syndrome, unspecified: Secondary | ICD-10-CM | POA: Diagnosis not present

## 2011-05-31 DIAGNOSIS — H04129 Dry eye syndrome of unspecified lacrimal gland: Secondary | ICD-10-CM | POA: Diagnosis not present

## 2011-06-04 DIAGNOSIS — S43429A Sprain of unspecified rotator cuff capsule, initial encounter: Secondary | ICD-10-CM | POA: Diagnosis not present

## 2011-06-06 DIAGNOSIS — S43429A Sprain of unspecified rotator cuff capsule, initial encounter: Secondary | ICD-10-CM | POA: Diagnosis not present

## 2011-06-10 DIAGNOSIS — S43429A Sprain of unspecified rotator cuff capsule, initial encounter: Secondary | ICD-10-CM | POA: Diagnosis not present

## 2011-06-12 DIAGNOSIS — S43429A Sprain of unspecified rotator cuff capsule, initial encounter: Secondary | ICD-10-CM | POA: Diagnosis not present

## 2011-06-17 DIAGNOSIS — S43429A Sprain of unspecified rotator cuff capsule, initial encounter: Secondary | ICD-10-CM | POA: Diagnosis not present

## 2011-06-19 DIAGNOSIS — S43429A Sprain of unspecified rotator cuff capsule, initial encounter: Secondary | ICD-10-CM | POA: Diagnosis not present

## 2011-06-26 DIAGNOSIS — S43429A Sprain of unspecified rotator cuff capsule, initial encounter: Secondary | ICD-10-CM | POA: Diagnosis not present

## 2011-06-28 DIAGNOSIS — S43429A Sprain of unspecified rotator cuff capsule, initial encounter: Secondary | ICD-10-CM | POA: Diagnosis not present

## 2011-07-01 DIAGNOSIS — S43429A Sprain of unspecified rotator cuff capsule, initial encounter: Secondary | ICD-10-CM | POA: Diagnosis not present

## 2011-07-03 DIAGNOSIS — S43429A Sprain of unspecified rotator cuff capsule, initial encounter: Secondary | ICD-10-CM | POA: Diagnosis not present

## 2011-07-04 DIAGNOSIS — Z1331 Encounter for screening for depression: Secondary | ICD-10-CM | POA: Diagnosis not present

## 2011-07-04 DIAGNOSIS — E871 Hypo-osmolality and hyponatremia: Secondary | ICD-10-CM | POA: Diagnosis not present

## 2011-07-04 DIAGNOSIS — G43909 Migraine, unspecified, not intractable, without status migrainosus: Secondary | ICD-10-CM | POA: Diagnosis not present

## 2011-07-04 DIAGNOSIS — E785 Hyperlipidemia, unspecified: Secondary | ICD-10-CM | POA: Diagnosis not present

## 2011-07-09 DIAGNOSIS — S43429A Sprain of unspecified rotator cuff capsule, initial encounter: Secondary | ICD-10-CM | POA: Diagnosis not present

## 2011-07-12 DIAGNOSIS — S43429A Sprain of unspecified rotator cuff capsule, initial encounter: Secondary | ICD-10-CM | POA: Diagnosis not present

## 2011-07-16 DIAGNOSIS — S43429A Sprain of unspecified rotator cuff capsule, initial encounter: Secondary | ICD-10-CM | POA: Diagnosis not present

## 2011-07-18 DIAGNOSIS — S43429A Sprain of unspecified rotator cuff capsule, initial encounter: Secondary | ICD-10-CM | POA: Diagnosis not present

## 2011-07-19 DIAGNOSIS — H04129 Dry eye syndrome of unspecified lacrimal gland: Secondary | ICD-10-CM | POA: Diagnosis not present

## 2011-07-19 DIAGNOSIS — M3500 Sicca syndrome, unspecified: Secondary | ICD-10-CM | POA: Diagnosis not present

## 2011-07-23 DIAGNOSIS — S43429A Sprain of unspecified rotator cuff capsule, initial encounter: Secondary | ICD-10-CM | POA: Diagnosis not present

## 2011-07-25 ENCOUNTER — Other Ambulatory Visit: Payer: Self-pay | Admitting: Gastroenterology

## 2011-07-25 DIAGNOSIS — K219 Gastro-esophageal reflux disease without esophagitis: Secondary | ICD-10-CM | POA: Diagnosis not present

## 2011-07-25 DIAGNOSIS — R110 Nausea: Secondary | ICD-10-CM

## 2011-07-25 DIAGNOSIS — R198 Other specified symptoms and signs involving the digestive system and abdomen: Secondary | ICD-10-CM | POA: Diagnosis not present

## 2011-07-25 DIAGNOSIS — S43429A Sprain of unspecified rotator cuff capsule, initial encounter: Secondary | ICD-10-CM | POA: Diagnosis not present

## 2011-07-25 DIAGNOSIS — Z79899 Other long term (current) drug therapy: Secondary | ICD-10-CM | POA: Diagnosis not present

## 2011-07-30 DIAGNOSIS — S43429A Sprain of unspecified rotator cuff capsule, initial encounter: Secondary | ICD-10-CM | POA: Diagnosis not present

## 2011-08-01 DIAGNOSIS — S43429A Sprain of unspecified rotator cuff capsule, initial encounter: Secondary | ICD-10-CM | POA: Diagnosis not present

## 2011-08-02 ENCOUNTER — Ambulatory Visit
Admission: RE | Admit: 2011-08-02 | Discharge: 2011-08-02 | Disposition: A | Discharge status: Home / Self Care | Payer: Medicare Other | Source: Ambulatory Visit | Attending: Gastroenterology | Admitting: Gastroenterology

## 2011-08-02 DIAGNOSIS — R110 Nausea: Secondary | ICD-10-CM

## 2011-08-06 DIAGNOSIS — S43429A Sprain of unspecified rotator cuff capsule, initial encounter: Secondary | ICD-10-CM | POA: Diagnosis not present

## 2011-08-08 DIAGNOSIS — S43429A Sprain of unspecified rotator cuff capsule, initial encounter: Secondary | ICD-10-CM | POA: Diagnosis not present

## 2011-08-14 DIAGNOSIS — S43429A Sprain of unspecified rotator cuff capsule, initial encounter: Secondary | ICD-10-CM | POA: Diagnosis not present

## 2011-09-02 DIAGNOSIS — H04129 Dry eye syndrome of unspecified lacrimal gland: Secondary | ICD-10-CM | POA: Diagnosis not present

## 2011-09-02 DIAGNOSIS — M3500 Sicca syndrome, unspecified: Secondary | ICD-10-CM | POA: Diagnosis not present

## 2011-09-04 DIAGNOSIS — M899 Disorder of bone, unspecified: Secondary | ICD-10-CM | POA: Diagnosis not present

## 2011-09-04 DIAGNOSIS — M949 Disorder of cartilage, unspecified: Secondary | ICD-10-CM | POA: Diagnosis not present

## 2011-09-05 DIAGNOSIS — G43919 Migraine, unspecified, intractable, without status migrainosus: Secondary | ICD-10-CM | POA: Diagnosis not present

## 2011-09-05 DIAGNOSIS — G43719 Chronic migraine without aura, intractable, without status migrainosus: Secondary | ICD-10-CM | POA: Diagnosis not present

## 2011-09-14 ENCOUNTER — Other Ambulatory Visit: Payer: Self-pay | Admitting: Family Medicine

## 2011-09-24 DIAGNOSIS — M3500 Sicca syndrome, unspecified: Secondary | ICD-10-CM | POA: Diagnosis not present

## 2011-09-25 DIAGNOSIS — S43429A Sprain of unspecified rotator cuff capsule, initial encounter: Secondary | ICD-10-CM | POA: Diagnosis not present

## 2011-10-16 ENCOUNTER — Telehealth: Payer: Self-pay | Admitting: Family Medicine

## 2011-10-16 NOTE — Telephone Encounter (Signed)
Krystal Clarke if you can reach the patient can you please schedule otherwise letter sent to schedule an apt.     KP

## 2011-10-16 NOTE — Telephone Encounter (Signed)
No refills without ov

## 2011-10-16 NOTE — Telephone Encounter (Signed)
Refill: Omeprazole-bicarb 40-1,000 cap. Take one capsule by mouth at bedtime. Qty 30. Last fill 09-16-11
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
## 2011-10-16 NOTE — Telephone Encounter (Signed)
Called pt at 130pm 7.31.13 LMOHP to return my call as soon as possible

## 2011-10-16 NOTE — Telephone Encounter (Signed)
Last OC 09/2009. Pt has no existing appts. Last filled 09/16/11 # 30. Please advise    KP

## 2011-10-17 DIAGNOSIS — M3500 Sicca syndrome, unspecified: Secondary | ICD-10-CM | POA: Diagnosis not present

## 2011-10-31 DIAGNOSIS — R198 Other specified symptoms and signs involving the digestive system and abdomen: Secondary | ICD-10-CM | POA: Diagnosis not present

## 2011-10-31 DIAGNOSIS — Z1231 Encounter for screening mammogram for malignant neoplasm of breast: Secondary | ICD-10-CM | POA: Diagnosis not present

## 2011-10-31 DIAGNOSIS — R110 Nausea: Secondary | ICD-10-CM | POA: Diagnosis not present

## 2011-11-19 DIAGNOSIS — Z23 Encounter for immunization: Secondary | ICD-10-CM | POA: Diagnosis not present

## 2011-11-20 DIAGNOSIS — H04129 Dry eye syndrome of unspecified lacrimal gland: Secondary | ICD-10-CM | POA: Diagnosis not present

## 2011-12-04 DIAGNOSIS — E559 Vitamin D deficiency, unspecified: Secondary | ICD-10-CM | POA: Diagnosis not present

## 2011-12-17 DIAGNOSIS — G43719 Chronic migraine without aura, intractable, without status migrainosus: Secondary | ICD-10-CM | POA: Diagnosis not present

## 2011-12-30 DIAGNOSIS — E559 Vitamin D deficiency, unspecified: Secondary | ICD-10-CM | POA: Diagnosis not present

## 2012-01-22 DIAGNOSIS — H04129 Dry eye syndrome of unspecified lacrimal gland: Secondary | ICD-10-CM | POA: Diagnosis not present

## 2012-01-22 DIAGNOSIS — M3500 Sicca syndrome, unspecified: Secondary | ICD-10-CM | POA: Diagnosis not present

## 2012-02-10 DIAGNOSIS — K591 Functional diarrhea: Secondary | ICD-10-CM | POA: Diagnosis not present

## 2012-02-10 DIAGNOSIS — R5381 Other malaise: Secondary | ICD-10-CM | POA: Diagnosis not present

## 2012-02-10 DIAGNOSIS — R5383 Other fatigue: Secondary | ICD-10-CM | POA: Diagnosis not present

## 2012-02-10 DIAGNOSIS — M3500 Sicca syndrome, unspecified: Secondary | ICD-10-CM | POA: Diagnosis not present

## 2012-02-10 DIAGNOSIS — Z79899 Other long term (current) drug therapy: Secondary | ICD-10-CM | POA: Diagnosis not present

## 2012-02-10 DIAGNOSIS — E785 Hyperlipidemia, unspecified: Secondary | ICD-10-CM | POA: Diagnosis not present

## 2012-02-10 DIAGNOSIS — E559 Vitamin D deficiency, unspecified: Secondary | ICD-10-CM | POA: Diagnosis not present

## 2012-02-10 DIAGNOSIS — I10 Essential (primary) hypertension: Secondary | ICD-10-CM | POA: Diagnosis not present

## 2012-02-10 DIAGNOSIS — R110 Nausea: Secondary | ICD-10-CM | POA: Diagnosis not present

## 2012-02-10 DIAGNOSIS — G43909 Migraine, unspecified, not intractable, without status migrainosus: Secondary | ICD-10-CM | POA: Diagnosis not present

## 2012-02-24 DIAGNOSIS — H04129 Dry eye syndrome of unspecified lacrimal gland: Secondary | ICD-10-CM | POA: Diagnosis not present

## 2012-02-24 DIAGNOSIS — H4011X0 Primary open-angle glaucoma, stage unspecified: Secondary | ICD-10-CM | POA: Diagnosis not present

## 2012-02-24 DIAGNOSIS — Z9889 Other specified postprocedural states: Secondary | ICD-10-CM | POA: Diagnosis not present

## 2012-02-24 DIAGNOSIS — H409 Unspecified glaucoma: Secondary | ICD-10-CM | POA: Diagnosis not present

## 2012-02-24 DIAGNOSIS — Z8669 Personal history of other diseases of the nervous system and sense organs: Secondary | ICD-10-CM | POA: Insufficient documentation

## 2012-02-24 DIAGNOSIS — H401133 Primary open-angle glaucoma, bilateral, severe stage: Secondary | ICD-10-CM | POA: Insufficient documentation

## 2012-02-26 ENCOUNTER — Ambulatory Visit: Payer: Medicare Other | Attending: Neurology | Admitting: Physical Therapy

## 2012-02-26 DIAGNOSIS — M6281 Muscle weakness (generalized): Secondary | ICD-10-CM | POA: Diagnosis not present

## 2012-02-26 DIAGNOSIS — I69998 Other sequelae following unspecified cerebrovascular disease: Secondary | ICD-10-CM | POA: Insufficient documentation

## 2012-02-26 DIAGNOSIS — IMO0001 Reserved for inherently not codable concepts without codable children: Secondary | ICD-10-CM | POA: Diagnosis not present

## 2012-02-26 DIAGNOSIS — R269 Unspecified abnormalities of gait and mobility: Secondary | ICD-10-CM | POA: Diagnosis not present

## 2012-02-27 DIAGNOSIS — IMO0002 Reserved for concepts with insufficient information to code with codable children: Secondary | ICD-10-CM | POA: Diagnosis not present

## 2012-02-27 DIAGNOSIS — M25569 Pain in unspecified knee: Secondary | ICD-10-CM | POA: Diagnosis not present

## 2012-02-27 DIAGNOSIS — M1710 Unilateral primary osteoarthritis, unspecified knee: Secondary | ICD-10-CM | POA: Diagnosis not present

## 2012-02-27 DIAGNOSIS — M3500 Sicca syndrome, unspecified: Secondary | ICD-10-CM | POA: Diagnosis not present

## 2012-03-12 ENCOUNTER — Encounter: Payer: Self-pay | Admitting: Neurology

## 2012-03-12 DIAGNOSIS — R413 Other amnesia: Secondary | ICD-10-CM

## 2012-03-12 DIAGNOSIS — G40109 Localization-related (focal) (partial) symptomatic epilepsy and epileptic syndromes with simple partial seizures, not intractable, without status epilepticus: Secondary | ICD-10-CM

## 2012-03-12 DIAGNOSIS — R269 Unspecified abnormalities of gait and mobility: Secondary | ICD-10-CM | POA: Diagnosis not present

## 2012-03-12 DIAGNOSIS — G459 Transient cerebral ischemic attack, unspecified: Secondary | ICD-10-CM

## 2012-03-12 DIAGNOSIS — G609 Hereditary and idiopathic neuropathy, unspecified: Secondary | ICD-10-CM

## 2012-03-12 DIAGNOSIS — H53139 Sudden visual loss, unspecified eye: Secondary | ICD-10-CM

## 2012-03-12 DIAGNOSIS — G43919 Migraine, unspecified, intractable, without status migrainosus: Secondary | ICD-10-CM | POA: Diagnosis not present

## 2012-03-16 ENCOUNTER — Ambulatory Visit: Payer: Medicare Other | Admitting: Physical Therapy

## 2012-03-19 ENCOUNTER — Ambulatory Visit: Payer: Medicare Other | Attending: Neurology | Admitting: Physical Therapy

## 2012-03-19 DIAGNOSIS — M6281 Muscle weakness (generalized): Secondary | ICD-10-CM | POA: Diagnosis not present

## 2012-03-19 DIAGNOSIS — IMO0001 Reserved for inherently not codable concepts without codable children: Secondary | ICD-10-CM | POA: Diagnosis not present

## 2012-03-19 DIAGNOSIS — R269 Unspecified abnormalities of gait and mobility: Secondary | ICD-10-CM | POA: Diagnosis not present

## 2012-03-23 ENCOUNTER — Ambulatory Visit: Payer: Medicare Other | Admitting: Physical Therapy

## 2012-03-25 ENCOUNTER — Ambulatory Visit: Payer: Medicare Other | Admitting: Physical Therapy

## 2012-03-26 DIAGNOSIS — H409 Unspecified glaucoma: Secondary | ICD-10-CM | POA: Diagnosis not present

## 2012-03-26 DIAGNOSIS — H04129 Dry eye syndrome of unspecified lacrimal gland: Secondary | ICD-10-CM | POA: Diagnosis not present

## 2012-03-26 DIAGNOSIS — M3500 Sicca syndrome, unspecified: Secondary | ICD-10-CM | POA: Diagnosis not present

## 2012-03-26 DIAGNOSIS — Z9889 Other specified postprocedural states: Secondary | ICD-10-CM | POA: Diagnosis not present

## 2012-03-26 DIAGNOSIS — H4011X0 Primary open-angle glaucoma, stage unspecified: Secondary | ICD-10-CM | POA: Diagnosis not present

## 2012-03-30 ENCOUNTER — Ambulatory Visit: Payer: Medicare Other | Admitting: Physical Therapy

## 2012-04-01 ENCOUNTER — Ambulatory Visit: Payer: Medicare Other | Admitting: Physical Therapy

## 2012-04-06 ENCOUNTER — Ambulatory Visit: Payer: Medicare Other | Admitting: Physical Therapy

## 2012-04-08 ENCOUNTER — Ambulatory Visit: Payer: Medicare Other | Admitting: Physical Therapy

## 2012-04-13 ENCOUNTER — Ambulatory Visit: Payer: Medicare Other | Admitting: Physical Therapy

## 2012-04-15 ENCOUNTER — Ambulatory Visit: Payer: Medicare Other | Admitting: Physical Therapy

## 2012-04-21 ENCOUNTER — Ambulatory Visit: Payer: Medicare Other | Attending: Neurology | Admitting: Physical Therapy

## 2012-04-21 DIAGNOSIS — R269 Unspecified abnormalities of gait and mobility: Secondary | ICD-10-CM | POA: Insufficient documentation

## 2012-04-21 DIAGNOSIS — IMO0001 Reserved for inherently not codable concepts without codable children: Secondary | ICD-10-CM | POA: Diagnosis not present

## 2012-04-21 DIAGNOSIS — M6281 Muscle weakness (generalized): Secondary | ICD-10-CM | POA: Insufficient documentation

## 2012-04-21 DIAGNOSIS — I69998 Other sequelae following unspecified cerebrovascular disease: Secondary | ICD-10-CM | POA: Insufficient documentation

## 2012-04-23 ENCOUNTER — Ambulatory Visit: Payer: Medicare Other | Admitting: Physical Therapy

## 2012-04-23 DIAGNOSIS — M1710 Unilateral primary osteoarthritis, unspecified knee: Secondary | ICD-10-CM | POA: Diagnosis not present

## 2012-04-23 DIAGNOSIS — M3500 Sicca syndrome, unspecified: Secondary | ICD-10-CM | POA: Diagnosis not present

## 2012-04-23 DIAGNOSIS — IMO0002 Reserved for concepts with insufficient information to code with codable children: Secondary | ICD-10-CM | POA: Diagnosis not present

## 2012-04-24 DIAGNOSIS — R110 Nausea: Secondary | ICD-10-CM | POA: Diagnosis not present

## 2012-04-24 DIAGNOSIS — R159 Full incontinence of feces: Secondary | ICD-10-CM | POA: Diagnosis not present

## 2012-04-24 DIAGNOSIS — R198 Other specified symptoms and signs involving the digestive system and abdomen: Secondary | ICD-10-CM | POA: Diagnosis not present

## 2012-05-06 DIAGNOSIS — M3500 Sicca syndrome, unspecified: Secondary | ICD-10-CM | POA: Diagnosis not present

## 2012-05-06 DIAGNOSIS — H04129 Dry eye syndrome of unspecified lacrimal gland: Secondary | ICD-10-CM | POA: Diagnosis not present

## 2012-05-06 DIAGNOSIS — H4011X0 Primary open-angle glaucoma, stage unspecified: Secondary | ICD-10-CM | POA: Diagnosis not present

## 2012-05-07 DIAGNOSIS — E559 Vitamin D deficiency, unspecified: Secondary | ICD-10-CM | POA: Diagnosis not present

## 2012-05-07 DIAGNOSIS — Z79899 Other long term (current) drug therapy: Secondary | ICD-10-CM | POA: Diagnosis not present

## 2012-05-07 DIAGNOSIS — I10 Essential (primary) hypertension: Secondary | ICD-10-CM | POA: Diagnosis not present

## 2012-05-07 DIAGNOSIS — R269 Unspecified abnormalities of gait and mobility: Secondary | ICD-10-CM | POA: Diagnosis not present

## 2012-05-07 DIAGNOSIS — G4730 Sleep apnea, unspecified: Secondary | ICD-10-CM | POA: Diagnosis not present

## 2012-05-07 DIAGNOSIS — E871 Hypo-osmolality and hyponatremia: Secondary | ICD-10-CM | POA: Diagnosis not present

## 2012-05-07 DIAGNOSIS — R5381 Other malaise: Secondary | ICD-10-CM | POA: Diagnosis not present

## 2012-05-09 ENCOUNTER — Other Ambulatory Visit: Payer: Self-pay | Admitting: Family Medicine

## 2012-05-09 ENCOUNTER — Encounter: Payer: Self-pay | Admitting: Neurology

## 2012-05-09 DIAGNOSIS — G40109 Localization-related (focal) (partial) symptomatic epilepsy and epileptic syndromes with simple partial seizures, not intractable, without status epilepticus: Secondary | ICD-10-CM | POA: Insufficient documentation

## 2012-05-09 DIAGNOSIS — G609 Hereditary and idiopathic neuropathy, unspecified: Secondary | ICD-10-CM | POA: Insufficient documentation

## 2012-05-09 DIAGNOSIS — G459 Transient cerebral ischemic attack, unspecified: Secondary | ICD-10-CM | POA: Insufficient documentation

## 2012-05-09 DIAGNOSIS — H53139 Sudden visual loss, unspecified eye: Secondary | ICD-10-CM | POA: Insufficient documentation

## 2012-05-09 DIAGNOSIS — R413 Other amnesia: Secondary | ICD-10-CM | POA: Insufficient documentation

## 2012-05-13 DIAGNOSIS — G479 Sleep disorder, unspecified: Secondary | ICD-10-CM | POA: Diagnosis not present

## 2012-05-13 DIAGNOSIS — G4733 Obstructive sleep apnea (adult) (pediatric): Secondary | ICD-10-CM | POA: Diagnosis not present

## 2012-05-26 DIAGNOSIS — G4733 Obstructive sleep apnea (adult) (pediatric): Secondary | ICD-10-CM | POA: Diagnosis not present

## 2012-06-05 ENCOUNTER — Ambulatory Visit (INDEPENDENT_AMBULATORY_CARE_PROVIDER_SITE_OTHER): Payer: Medicare Other | Admitting: Neurology

## 2012-06-05 ENCOUNTER — Encounter: Payer: Self-pay | Admitting: Neurology

## 2012-06-05 VITALS — BP 148/71 | HR 57 | Temp 98.1°F | Ht 63.0 in | Wt 163.0 lb

## 2012-06-05 DIAGNOSIS — R269 Unspecified abnormalities of gait and mobility: Secondary | ICD-10-CM | POA: Insufficient documentation

## 2012-06-05 DIAGNOSIS — G629 Polyneuropathy, unspecified: Secondary | ICD-10-CM | POA: Insufficient documentation

## 2012-06-05 DIAGNOSIS — G43909 Migraine, unspecified, not intractable, without status migrainosus: Secondary | ICD-10-CM | POA: Diagnosis not present

## 2012-06-05 DIAGNOSIS — G609 Hereditary and idiopathic neuropathy, unspecified: Secondary | ICD-10-CM | POA: Diagnosis not present

## 2012-06-05 DIAGNOSIS — M3500 Sicca syndrome, unspecified: Secondary | ICD-10-CM | POA: Diagnosis not present

## 2012-06-05 HISTORY — DX: Unspecified abnormalities of gait and mobility: R26.9

## 2012-06-05 MED ORDER — PREGABALIN 75 MG PO CAPS: 75.0000 mg | ORAL_CAPSULE | Freq: Two times a day (BID) | ORAL | Status: DC

## 2012-06-05 MED ORDER — RIZATRIPTAN BENZOATE 10 MG PO TABS: ORAL_TABLET | ORAL | Status: DC

## 2012-06-05 NOTE — Patient Instructions (Signed)
I think overall you are doing fairly well. I would like to keep you on Lyrica 75 mg twice daily and use the Maxalt as needed for your migraine headaches. Tried to exercise daily by walking with her walker. He eats a heart healthy diet and drink plenty of fluids. Call us for any refill requests. I have renewed your Lyrica and Maxalt prescriptions today. Call me with any interim questions, concerns, problems or updates. I should be able to see you back in 6 months, sooner if the need arises.
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
## 2012-06-16 DIAGNOSIS — Z01419 Encounter for gynecological examination (general) (routine) without abnormal findings: Secondary | ICD-10-CM | POA: Diagnosis not present

## 2012-06-16 DIAGNOSIS — N904 Leukoplakia of vulva: Secondary | ICD-10-CM | POA: Diagnosis not present

## 2012-06-16 DIAGNOSIS — L293 Anogenital pruritus, unspecified: Secondary | ICD-10-CM | POA: Diagnosis not present

## 2012-06-16 DIAGNOSIS — N760 Acute vaginitis: Secondary | ICD-10-CM | POA: Diagnosis not present

## 2012-06-19 DIAGNOSIS — E871 Hypo-osmolality and hyponatremia: Secondary | ICD-10-CM | POA: Diagnosis not present

## 2012-06-24 DIAGNOSIS — H04129 Dry eye syndrome of unspecified lacrimal gland: Secondary | ICD-10-CM | POA: Diagnosis not present

## 2012-06-24 DIAGNOSIS — H4011X0 Primary open-angle glaucoma, stage unspecified: Secondary | ICD-10-CM | POA: Diagnosis not present

## 2012-06-24 DIAGNOSIS — M3500 Sicca syndrome, unspecified: Secondary | ICD-10-CM | POA: Diagnosis not present

## 2012-07-14 ENCOUNTER — Other Ambulatory Visit: Payer: Self-pay

## 2012-07-14 DIAGNOSIS — N760 Acute vaginitis: Secondary | ICD-10-CM | POA: Diagnosis not present

## 2012-07-14 MED ORDER — DIVALPROEX SODIUM ER 250 MG PO TB24: 250.0000 mg | ORAL_TABLET | Freq: Three times a day (TID) | ORAL | Status: DC

## 2012-07-20 DIAGNOSIS — Z8669 Personal history of other diseases of the nervous system and sense organs: Secondary | ICD-10-CM | POA: Diagnosis not present

## 2012-07-20 DIAGNOSIS — Z8739 Personal history of other diseases of the musculoskeletal system and connective tissue: Secondary | ICD-10-CM | POA: Diagnosis not present

## 2012-07-20 DIAGNOSIS — H4011X0 Primary open-angle glaucoma, stage unspecified: Secondary | ICD-10-CM | POA: Diagnosis not present

## 2012-07-20 DIAGNOSIS — Z79899 Other long term (current) drug therapy: Secondary | ICD-10-CM | POA: Diagnosis not present

## 2012-07-20 DIAGNOSIS — M3500 Sicca syndrome, unspecified: Secondary | ICD-10-CM | POA: Diagnosis not present

## 2012-07-20 DIAGNOSIS — H409 Unspecified glaucoma: Secondary | ICD-10-CM | POA: Diagnosis not present

## 2012-07-20 DIAGNOSIS — G43909 Migraine, unspecified, not intractable, without status migrainosus: Secondary | ICD-10-CM | POA: Diagnosis not present

## 2012-07-20 DIAGNOSIS — M129 Arthropathy, unspecified: Secondary | ICD-10-CM | POA: Diagnosis not present

## 2012-07-20 DIAGNOSIS — H04129 Dry eye syndrome of unspecified lacrimal gland: Secondary | ICD-10-CM | POA: Diagnosis not present

## 2012-07-20 DIAGNOSIS — Z961 Presence of intraocular lens: Secondary | ICD-10-CM | POA: Diagnosis not present

## 2012-07-20 DIAGNOSIS — M810 Age-related osteoporosis without current pathological fracture: Secondary | ICD-10-CM | POA: Diagnosis not present

## 2012-07-29 DIAGNOSIS — H04129 Dry eye syndrome of unspecified lacrimal gland: Secondary | ICD-10-CM | POA: Diagnosis not present

## 2012-07-29 DIAGNOSIS — H4011X0 Primary open-angle glaucoma, stage unspecified: Secondary | ICD-10-CM | POA: Diagnosis not present

## 2012-07-29 DIAGNOSIS — M3500 Sicca syndrome, unspecified: Secondary | ICD-10-CM | POA: Diagnosis not present

## 2012-08-04 DIAGNOSIS — R5381 Other malaise: Secondary | ICD-10-CM | POA: Diagnosis not present

## 2012-08-04 DIAGNOSIS — R5383 Other fatigue: Secondary | ICD-10-CM | POA: Diagnosis not present

## 2012-08-04 DIAGNOSIS — M3500 Sicca syndrome, unspecified: Secondary | ICD-10-CM | POA: Diagnosis not present

## 2012-08-04 DIAGNOSIS — G43909 Migraine, unspecified, not intractable, without status migrainosus: Secondary | ICD-10-CM | POA: Diagnosis not present

## 2012-08-17 ENCOUNTER — Other Ambulatory Visit: Payer: Self-pay | Admitting: Gastroenterology

## 2012-08-17 DIAGNOSIS — R159 Full incontinence of feces: Secondary | ICD-10-CM | POA: Diagnosis not present

## 2012-08-17 DIAGNOSIS — R109 Unspecified abdominal pain: Secondary | ICD-10-CM

## 2012-08-17 DIAGNOSIS — R110 Nausea: Secondary | ICD-10-CM | POA: Diagnosis not present

## 2012-08-17 DIAGNOSIS — R1084 Generalized abdominal pain: Secondary | ICD-10-CM | POA: Diagnosis not present

## 2012-08-19 DIAGNOSIS — IMO0002 Reserved for concepts with insufficient information to code with codable children: Secondary | ICD-10-CM | POA: Diagnosis not present

## 2012-08-19 DIAGNOSIS — M25569 Pain in unspecified knee: Secondary | ICD-10-CM | POA: Diagnosis not present

## 2012-08-19 DIAGNOSIS — M3500 Sicca syndrome, unspecified: Secondary | ICD-10-CM | POA: Diagnosis not present

## 2012-08-19 DIAGNOSIS — M1710 Unilateral primary osteoarthritis, unspecified knee: Secondary | ICD-10-CM | POA: Diagnosis not present

## 2012-08-21 ENCOUNTER — Ambulatory Visit
Admission: RE | Admit: 2012-08-21 | Discharge: 2012-08-21 | Disposition: A | Discharge status: Home / Self Care | Payer: Medicare Other | Source: Ambulatory Visit | Attending: Gastroenterology | Admitting: Gastroenterology

## 2012-08-21 DIAGNOSIS — R110 Nausea: Secondary | ICD-10-CM

## 2012-08-21 DIAGNOSIS — R109 Unspecified abdominal pain: Secondary | ICD-10-CM

## 2012-09-08 DIAGNOSIS — J309 Allergic rhinitis, unspecified: Secondary | ICD-10-CM | POA: Diagnosis not present

## 2012-09-09 DIAGNOSIS — Z9889 Other specified postprocedural states: Secondary | ICD-10-CM | POA: Diagnosis not present

## 2012-09-09 DIAGNOSIS — H409 Unspecified glaucoma: Secondary | ICD-10-CM | POA: Diagnosis not present

## 2012-09-09 DIAGNOSIS — H4011X0 Primary open-angle glaucoma, stage unspecified: Secondary | ICD-10-CM | POA: Diagnosis not present

## 2012-09-09 DIAGNOSIS — H04129 Dry eye syndrome of unspecified lacrimal gland: Secondary | ICD-10-CM | POA: Diagnosis not present

## 2012-09-10 DIAGNOSIS — R110 Nausea: Secondary | ICD-10-CM | POA: Diagnosis not present

## 2012-09-10 DIAGNOSIS — R159 Full incontinence of feces: Secondary | ICD-10-CM | POA: Diagnosis not present

## 2012-09-10 DIAGNOSIS — K591 Functional diarrhea: Secondary | ICD-10-CM | POA: Diagnosis not present

## 2012-09-14 ENCOUNTER — Telehealth: Payer: Self-pay | Admitting: Neurology

## 2012-09-14 DIAGNOSIS — G609 Hereditary and idiopathic neuropathy, unspecified: Secondary | ICD-10-CM

## 2012-09-14 DIAGNOSIS — G43909 Migraine, unspecified, not intractable, without status migrainosus: Secondary | ICD-10-CM

## 2012-09-15 MED ORDER — PREGABALIN 75 MG PO CAPS: ORAL_CAPSULE | ORAL | Status: DC

## 2012-09-15 NOTE — Telephone Encounter (Signed)
Please call patient and advise her to increase her Lyrica to 2 pills at night and continue with one pill in the morning. She will take a total of 3 pills of Lyrica daily. This may help her headaches. Please advise her that the Lyrica increase may make her more sedated and that she should be mindful that.

## 2012-09-15 NOTE — Telephone Encounter (Signed)
Patient has called back and stated that she has  had 5 headaches already this week and nothing is helping. She has tried ice and meds but it has not helped. Her pain has moved from the side of her head to the center and skull. She really wants some relief.

## 2012-09-15 NOTE — Telephone Encounter (Signed)
Spoke to patient and she will increase her Lyrica. She stated that she was having some nausea so she had cut back on taking it but since she knows should help her headaches she will start the 1 tab in the morning and the 2 at night immediatly. If this does not help she will call the office back.

## 2012-09-15 NOTE — Telephone Encounter (Signed)
Called patient no answer. Left a message asking her to call the office back so that we can get more details on what's going on.

## 2012-10-09 DIAGNOSIS — J309 Allergic rhinitis, unspecified: Secondary | ICD-10-CM | POA: Diagnosis not present

## 2012-10-12 DIAGNOSIS — G4734 Idiopathic sleep related nonobstructive alveolar hypoventilation: Secondary | ICD-10-CM | POA: Diagnosis not present

## 2012-11-04 DIAGNOSIS — H04129 Dry eye syndrome of unspecified lacrimal gland: Secondary | ICD-10-CM | POA: Diagnosis not present

## 2012-11-04 DIAGNOSIS — M3500 Sicca syndrome, unspecified: Secondary | ICD-10-CM | POA: Diagnosis not present

## 2012-11-18 ENCOUNTER — Other Ambulatory Visit: Payer: Self-pay

## 2012-11-18 MED ORDER — DIVALPROEX SODIUM ER 250 MG PO TB24: 250.0000 mg | ORAL_TABLET | Freq: Three times a day (TID) | ORAL | Status: DC

## 2012-11-18 NOTE — Telephone Encounter (Signed)
Pharmacy requests 90 day Rx  

## 2012-11-25 ENCOUNTER — Other Ambulatory Visit: Payer: Self-pay | Admitting: Family Medicine

## 2012-11-25 ENCOUNTER — Ambulatory Visit
Admission: RE | Admit: 2012-11-25 | Discharge: 2012-11-25 | Disposition: A | Discharge status: Home / Self Care | Payer: Medicare Other | Source: Ambulatory Visit | Attending: Family Medicine | Admitting: Family Medicine

## 2012-11-25 DIAGNOSIS — S8000XA Contusion of unspecified knee, initial encounter: Secondary | ICD-10-CM | POA: Diagnosis not present

## 2012-11-25 DIAGNOSIS — S9030XA Contusion of unspecified foot, initial encounter: Secondary | ICD-10-CM | POA: Diagnosis not present

## 2012-11-25 DIAGNOSIS — S9031XA Contusion of right foot, initial encounter: Secondary | ICD-10-CM

## 2012-11-25 DIAGNOSIS — S40019A Contusion of unspecified shoulder, initial encounter: Secondary | ICD-10-CM | POA: Diagnosis not present

## 2012-11-25 DIAGNOSIS — S8990XA Unspecified injury of unspecified lower leg, initial encounter: Secondary | ICD-10-CM | POA: Diagnosis not present

## 2012-11-25 DIAGNOSIS — Z23 Encounter for immunization: Secondary | ICD-10-CM | POA: Diagnosis not present

## 2012-11-25 DIAGNOSIS — M79609 Pain in unspecified limb: Secondary | ICD-10-CM | POA: Diagnosis not present

## 2012-11-25 DIAGNOSIS — E559 Vitamin D deficiency, unspecified: Secondary | ICD-10-CM | POA: Diagnosis not present

## 2012-11-25 DIAGNOSIS — W19XXXA Unspecified fall, initial encounter: Secondary | ICD-10-CM | POA: Diagnosis not present

## 2012-12-06 ENCOUNTER — Emergency Department (HOSPITAL_COMMUNITY): Payer: Medicare Other

## 2012-12-06 ENCOUNTER — Inpatient Hospital Stay (HOSPITAL_COMMUNITY)
Admission: EM | Admit: 2012-12-06 | Discharge: 2012-12-10 | DRG: 308 | Disposition: A | Discharge status: Home / Self Care | Payer: Medicare Other | Attending: Internal Medicine | Admitting: Internal Medicine

## 2012-12-06 ENCOUNTER — Encounter (HOSPITAL_COMMUNITY): Payer: Self-pay | Admitting: Emergency Medicine

## 2012-12-06 ENCOUNTER — Ambulatory Visit (INDEPENDENT_AMBULATORY_CARE_PROVIDER_SITE_OTHER): Payer: Medicare Other | Admitting: Family Medicine

## 2012-12-06 VITALS — BP 156/104 | HR 111 | Temp 99.0°F | Resp 17 | Ht 62.0 in | Wt 156.0 lb

## 2012-12-06 DIAGNOSIS — E785 Hyperlipidemia, unspecified: Secondary | ICD-10-CM | POA: Diagnosis present

## 2012-12-06 DIAGNOSIS — I4891 Unspecified atrial fibrillation: Secondary | ICD-10-CM

## 2012-12-06 DIAGNOSIS — J9601 Acute respiratory failure with hypoxia: Secondary | ICD-10-CM | POA: Diagnosis present

## 2012-12-06 DIAGNOSIS — E871 Hypo-osmolality and hyponatremia: Secondary | ICD-10-CM | POA: Diagnosis present

## 2012-12-06 DIAGNOSIS — J189 Pneumonia, unspecified organism: Secondary | ICD-10-CM | POA: Diagnosis not present

## 2012-12-06 DIAGNOSIS — R109 Unspecified abdominal pain: Secondary | ICD-10-CM | POA: Diagnosis not present

## 2012-12-06 DIAGNOSIS — I10 Essential (primary) hypertension: Secondary | ICD-10-CM | POA: Diagnosis not present

## 2012-12-06 DIAGNOSIS — G43909 Migraine, unspecified, not intractable, without status migrainosus: Secondary | ICD-10-CM | POA: Diagnosis present

## 2012-12-06 DIAGNOSIS — R9431 Abnormal electrocardiogram [ECG] [EKG]: Secondary | ICD-10-CM

## 2012-12-06 DIAGNOSIS — I517 Cardiomegaly: Secondary | ICD-10-CM | POA: Diagnosis not present

## 2012-12-06 DIAGNOSIS — K219 Gastro-esophageal reflux disease without esophagitis: Secondary | ICD-10-CM | POA: Diagnosis present

## 2012-12-06 DIAGNOSIS — J069 Acute upper respiratory infection, unspecified: Secondary | ICD-10-CM | POA: Diagnosis present

## 2012-12-06 DIAGNOSIS — R05 Cough: Secondary | ICD-10-CM

## 2012-12-06 DIAGNOSIS — J40 Bronchitis, not specified as acute or chronic: Secondary | ICD-10-CM

## 2012-12-06 DIAGNOSIS — E876 Hypokalemia: Secondary | ICD-10-CM | POA: Diagnosis present

## 2012-12-06 DIAGNOSIS — G40909 Epilepsy, unspecified, not intractable, without status epilepticus: Secondary | ICD-10-CM | POA: Diagnosis present

## 2012-12-06 DIAGNOSIS — R5383 Other fatigue: Secondary | ICD-10-CM

## 2012-12-06 DIAGNOSIS — J9600 Acute respiratory failure, unspecified whether with hypoxia or hypercapnia: Secondary | ICD-10-CM | POA: Diagnosis present

## 2012-12-06 DIAGNOSIS — I498 Other specified cardiac arrhythmias: Secondary | ICD-10-CM | POA: Diagnosis not present

## 2012-12-06 DIAGNOSIS — R5381 Other malaise: Secondary | ICD-10-CM | POA: Diagnosis not present

## 2012-12-06 DIAGNOSIS — E860 Dehydration: Secondary | ICD-10-CM | POA: Diagnosis not present

## 2012-12-06 DIAGNOSIS — M3500 Sicca syndrome, unspecified: Secondary | ICD-10-CM | POA: Diagnosis present

## 2012-12-06 DIAGNOSIS — F29 Unspecified psychosis not due to a substance or known physiological condition: Secondary | ICD-10-CM | POA: Diagnosis not present

## 2012-12-06 DIAGNOSIS — I480 Paroxysmal atrial fibrillation: Secondary | ICD-10-CM | POA: Insufficient documentation

## 2012-12-06 DIAGNOSIS — R000 Tachycardia, unspecified: Secondary | ICD-10-CM

## 2012-12-06 DIAGNOSIS — Z885 Allergy status to narcotic agent status: Secondary | ICD-10-CM | POA: Diagnosis not present

## 2012-12-06 DIAGNOSIS — Z79899 Other long term (current) drug therapy: Secondary | ICD-10-CM

## 2012-12-06 DIAGNOSIS — R269 Unspecified abnormalities of gait and mobility: Secondary | ICD-10-CM | POA: Diagnosis present

## 2012-12-06 DIAGNOSIS — R0989 Other specified symptoms and signs involving the circulatory and respiratory systems: Secondary | ICD-10-CM | POA: Diagnosis not present

## 2012-12-06 DIAGNOSIS — R009 Unspecified abnormalities of heart beat: Secondary | ICD-10-CM

## 2012-12-06 DIAGNOSIS — R2681 Unsteadiness on feet: Secondary | ICD-10-CM

## 2012-12-06 DIAGNOSIS — D518 Other vitamin B12 deficiency anemias: Secondary | ICD-10-CM | POA: Diagnosis present

## 2012-12-06 DIAGNOSIS — G609 Hereditary and idiopathic neuropathy, unspecified: Secondary | ICD-10-CM | POA: Diagnosis present

## 2012-12-06 DIAGNOSIS — R059 Cough, unspecified: Secondary | ICD-10-CM | POA: Diagnosis not present

## 2012-12-06 DIAGNOSIS — R404 Transient alteration of awareness: Secondary | ICD-10-CM | POA: Diagnosis not present

## 2012-12-06 DIAGNOSIS — R410 Disorientation, unspecified: Secondary | ICD-10-CM

## 2012-12-06 DIAGNOSIS — R627 Adult failure to thrive: Secondary | ICD-10-CM | POA: Diagnosis present

## 2012-12-06 DIAGNOSIS — G629 Polyneuropathy, unspecified: Secondary | ICD-10-CM | POA: Diagnosis present

## 2012-12-06 HISTORY — DX: Paroxysmal atrial fibrillation: I48.0

## 2012-12-06 HISTORY — DX: Migraine, unspecified, not intractable, without status migrainosus: G43.909

## 2012-12-06 HISTORY — DX: Acute respiratory failure with hypoxia: J96.01

## 2012-12-06 LAB — URINE MICROSCOPIC-ADD ON

## 2012-12-06 LAB — BASIC METABOLIC PANEL
CO2: 27 mEq/L (ref 19–32)
Creatinine, Ser: 0.55 mg/dL (ref 0.50–1.10)
GFR calc Af Amer: >90 mL/min (ref >90)
GFR calc non Af Amer: 89 mL/min — ABNORMAL LOW (ref >90)
Potassium: 3.3 mEq/L — ABNORMAL LOW (ref 3.5–5.1)
Sodium: 132 mEq/L — ABNORMAL LOW (ref 135–145)

## 2012-12-06 LAB — URINALYSIS, ROUTINE W REFLEX MICROSCOPIC
Protein, ur: 100 mg/dL — AB
Urobilinogen, UA: 1 mg/dL (ref 0.0–1.0)
pH: 6.5 (ref 5.0–8.0)

## 2012-12-06 LAB — CBC WITH DIFFERENTIAL/PLATELET
Basophils Relative: 0 % (ref 0–1)
Eosinophils Absolute: 0 10*3/uL (ref 0.0–0.7)
MCH: 29.2 pg (ref 26.0–34.0)
MCHC: 35.2 g/dL (ref 30.0–36.0)
Neutrophils Relative %: 81 % — ABNORMAL HIGH (ref 43–77)
Platelets: 137 10*3/uL — ABNORMAL LOW (ref 150–400)
RBC: 4.79 MIL/uL (ref 3.87–5.11)

## 2012-12-06 LAB — TROPONIN I: Troponin I: <0.3 ng/mL (ref <0.30)

## 2012-12-06 MED ORDER — SUCRALFATE 1 G PO TABS
1.0000 g | ORAL_TABLET | Freq: Four times a day (QID) | ORAL | Status: DC
  Administered 2012-12-07 – 2012-12-10 (×14): 1 g via ORAL
  Filled 2012-12-06 (×17): qty 1

## 2012-12-06 MED ORDER — PREGABALIN 50 MG PO CAPS
75.0000 mg | ORAL_CAPSULE | Freq: Two times a day (BID) | ORAL | Status: DC
  Administered 2012-12-07 – 2012-12-10 (×8): 75 mg via ORAL
  Filled 2012-12-06 (×16): qty 1

## 2012-12-06 MED ORDER — DEXTROSE 5 % IV SOLN
500.0000 mg | INTRAVENOUS | Status: DC
  Administered 2012-12-07 (×2): 500 mg via INTRAVENOUS
  Filled 2012-12-06 (×2): qty 500

## 2012-12-06 MED ORDER — ONDANSETRON HCL 4 MG/2ML IJ SOLN
4.0000 mg | Freq: Once | INTRAMUSCULAR | Status: AC
  Administered 2012-12-06: 4 mg via INTRAVENOUS
  Filled 2012-12-06: qty 2

## 2012-12-06 MED ORDER — LORAZEPAM 0.5 MG PO TABS
0.5000 mg | ORAL_TABLET | Freq: Three times a day (TID) | ORAL | Status: DC | PRN
  Administered 2012-12-07: 0.5 mg via ORAL
  Filled 2012-12-06: qty 1

## 2012-12-06 MED ORDER — SODIUM CHLORIDE 0.9 % IJ SOLN: 3.0000 mL | INTRAMUSCULAR | Status: DC | PRN

## 2012-12-06 MED ORDER — ACETAMINOPHEN 325 MG PO TABS
650.0000 mg | ORAL_TABLET | Freq: Once | ORAL | Status: AC
  Administered 2012-12-06: 650 mg via ORAL
  Filled 2012-12-06: qty 2

## 2012-12-06 MED ORDER — DORZOLAMIDE HCL-TIMOLOL MAL 2-0.5 % OP SOLN
1.0000 [drp] | Freq: Two times a day (BID) | OPHTHALMIC | Status: DC
  Administered 2012-12-07 – 2012-12-10 (×8): 1 [drp] via OPHTHALMIC
  Filled 2012-12-06: qty 10

## 2012-12-06 MED ORDER — ONDANSETRON HCL 4 MG/2ML IJ SOLN
4.0000 mg | Freq: Three times a day (TID) | INTRAMUSCULAR | Status: AC | PRN
  Administered 2012-12-07: 4 mg via INTRAVENOUS
  Filled 2012-12-06: qty 2

## 2012-12-06 MED ORDER — DILTIAZEM HCL 100 MG IV SOLR
5.0000 mg/h | INTRAVENOUS | Status: DC
  Administered 2012-12-06: 5 mg/h via INTRAVENOUS
  Filled 2012-12-06: qty 100

## 2012-12-06 MED ORDER — DEXTROSE 5 % IV SOLN
1.0000 g | INTRAVENOUS | Status: DC
  Administered 2012-12-06 – 2012-12-08 (×3): 1 g via INTRAVENOUS
  Filled 2012-12-06 (×5): qty 10

## 2012-12-06 MED ORDER — SODIUM CHLORIDE 0.9 % IV SOLN
250.0000 mL | INTRAVENOUS | Status: DC | PRN
  Administered 2012-12-06: 250 mL via INTRAVENOUS

## 2012-12-06 MED ORDER — METOPROLOL TARTRATE 1 MG/ML IV SOLN: 2.5000 mg | Freq: Once | INTRAVENOUS | Status: DC

## 2012-12-06 MED ORDER — PILOCARPINE HCL 5 MG PO TABS
5.0000 mg | ORAL_TABLET | Freq: Three times a day (TID) | ORAL | Status: DC
  Administered 2012-12-07 – 2012-12-10 (×11): 5 mg via ORAL
  Filled 2012-12-06 (×13): qty 1

## 2012-12-06 MED ORDER — SODIUM CHLORIDE 0.9 % IJ SOLN
3.0000 mL | Freq: Two times a day (BID) | INTRAMUSCULAR | Status: DC
Start: 2012-12-06 — End: 2012-12-10
  Administered 2012-12-08 – 2012-12-09 (×4): 3 mL via INTRAVENOUS

## 2012-12-06 MED ORDER — VITAMIN D (ERGOCALCIFEROL) 1.25 MG (50000 UNIT) PO CAPS
50000.0000 [IU] | ORAL_CAPSULE | ORAL | Status: DC
  Administered 2012-12-07: 50000 [IU] via ORAL
  Filled 2012-12-06: qty 1

## 2012-12-06 MED ORDER — SODIUM CHLORIDE 0.9 % IV BOLUS (SEPSIS)
500.0000 mL | Freq: Once | INTRAVENOUS | Status: AC
  Administered 2012-12-06: 500 mL via INTRAVENOUS

## 2012-12-06 MED ORDER — POTASSIUM CHLORIDE CRYS ER 20 MEQ PO TBCR
40.0000 meq | EXTENDED_RELEASE_TABLET | Freq: Once | ORAL | Status: AC
  Administered 2012-12-06: 40 meq via ORAL
  Filled 2012-12-06: qty 2

## 2012-12-06 MED ORDER — DIVALPROEX SODIUM ER 250 MG PO TB24
250.0000 mg | ORAL_TABLET | Freq: Two times a day (BID) | ORAL | Status: DC
  Administered 2012-12-07 – 2012-12-10 (×8): 250 mg via ORAL
  Filled 2012-12-06 (×9): qty 1

## 2012-12-06 MED ORDER — LORAZEPAM 1 MG PO TABS
0.5000 mg | ORAL_TABLET | Freq: Once | ORAL | Status: AC
  Administered 2012-12-06: 0.5 mg via ORAL
  Filled 2012-12-06: qty 1

## 2012-12-06 MED ORDER — ENOXAPARIN SODIUM 150 MG/ML ~~LOC~~ SOLN: 1.0000 mg/kg | Freq: Once | SUBCUTANEOUS | Status: DC

## 2012-12-06 MED ORDER — SODIUM CHLORIDE 0.9 % IV SOLN
INTRAVENOUS | Status: DC
  Administered 2012-12-06: 22:00:00 via INTRAVENOUS

## 2012-12-06 MED ORDER — FENOFIBRATE 160 MG PO TABS
160.0000 mg | ORAL_TABLET | Freq: Every day | ORAL | Status: DC
  Administered 2012-12-07 – 2012-12-10 (×4): 160 mg via ORAL
  Filled 2012-12-06 (×4): qty 1

## 2012-12-06 MED ORDER — SODIUM CHLORIDE 0.9 % IJ SOLN: 3.0000 mL | Freq: Two times a day (BID) | INTRAMUSCULAR | Status: DC

## 2012-12-06 MED ORDER — RIVAROXABAN 20 MG PO TABS
20.0000 mg | ORAL_TABLET | Freq: Every day | ORAL | Status: DC
  Administered 2012-12-06 – 2012-12-08 (×3): 20 mg via ORAL
  Filled 2012-12-06 (×3): qty 1

## 2012-12-06 NOTE — ED Notes (Signed)
Pt repeating desire to go home now repeatedly

## 2012-12-06 NOTE — Progress Notes (Signed)
Subjective:    Patient ID: Krystal Clarke, female    DOB: [birth year], 77 y.o.   MRN: [id number]  HPI [name] is a 77 y.o. female  JXB:JYNWG,NFAOZHY S, MD  Presents with coughing fits, nausea- minimal post tussive emesis , nasal congestion past 2 days. Unknown if fever, but has felt hot and cold.  No known underlying lung disease. Dry cough. Denies dyspnea. Cough is worst part. Headache- hx of migraines. No chest pains. Is feeling lightheaded at times today  Has had recurrent nausea - this has been evaluated by primary provider.   Tx: none, no otc cold medicines.   Sick contacts: husband with upper respiratory illness last week, treated with antibiotic for possible progression to pneumonia without apparent pneumonia.   Here with Dtr Arline Asp.    Past Medical History  Diagnosis Date  . Hypertension   . Peripheral neuropathy   . Memory loss   . Vision abnormalities   . Seizures   . Peripheral neuropathy   . Sjogren's syndrome   . FHx: migraine headaches   . Sjogren's syndrome   . Gait disorder 06/05/2012   Patient Active Problem List   Diagnosis Date Noted  . Gait disorder 06/05/2012  . Peripheral neuropathy   . Sjogren's syndrome   . Memory loss 05/09/2012  . Sudden visual loss 05/09/2012  . Unspecified hereditary and idiopathic peripheral neuropathy 05/09/2012  . Localization-related (focal) (partial) epilepsy and epileptic syndromes with simple partial seizures, without mention of intractable epilepsy 05/09/2012  . TIA 05/09/2012  . CANDIDIASIS, VULVAR 09/25/2009  . ANEMIA, B12 DEFICIENCY 07/19/2009  . SHINGLES, HX OF 07/17/2009  . ANXIETY STATE, UNSPECIFIED 01/27/2009  . DYSURIA 01/27/2009  . ABDOMINAL PAIN OTHER SPECIFIED SITE 01/27/2009  . DIVERTICULITIS, HX OF 01/27/2009  . SLEEP APNEA 12/19/2008  . MIXED HYPERLIPIDEMIA 07/18/2008  . MAMMOGRAPHIC MICROCALCIFICATION 03/09/2008  . FATIGUE 10/29/2007  . INSOMNIA 04/07/2007  . EAR PAIN, RIGHT 01/08/2007   . OTITIS MEDIA, ACUTE 12/31/2006  . URI 12/31/2006  . GUAIAC POSITIVE STOOL 11/18/2006  . SYMPTOM, DIARRHEA NOS 11/18/2006  . HYPERTRIGLYCERIDEMIA 08/28/2006  . MIGRAINE HEADACHE 08/28/2006  . GLAUCOMA 08/28/2006  . HYPERTENSION 08/28/2006  . GERD 08/28/2006  . PSORIASIS 08/28/2006  . OSTEOARTHRITIS 08/28/2006  . OSTEOPOROSIS 08/28/2006   No past surgical history on file.   Allergies  Allergen Reactions  . Codeine   . Hydrocodone   . Morphine And Related   . Oxycodone-Aspirin     REACTION: DEATHLY SICK-VOMITTING   Prior to Admission medications   Medication Sig Start Date End Date Taking? Authorizing Provider  aspirin 81 MG tablet Take 81 mg by mouth daily.   Yes Historical Provider, MD  divalproex (DEPAKOTE ER) 250 MG 24 hr tablet Take 1 tablet (250 mg total) by mouth 3 (three) times daily. 11/18/12  Yes Huston Foley, MD  dorzolamide-timolol (COSOPT) 22.3-6.8 MG/ML ophthalmic solution 1 drop 2 (two) times daily.   Yes Historical Provider, MD  fenofibrate (TRICOR) 145 MG tablet Take 145 mg by mouth daily.   Yes Historical Provider, MD  lidocaine-prilocaine (EMLA) cream Apply 1 application topically as needed.   Yes Historical Provider, MD  LORazepam (ATIVAN) 0.5 MG tablet Take 0.5 mg by mouth every 8 (eight) hours.   Yes Historical Provider, MD  meloxicam (MOBIC) 15 MG tablet  05/11/12  Yes Historical Provider, MD  metoprolol succinate (TOPROL-XL) 25 MG 24 hr tablet Take 25 mg by mouth daily.   Yes Historical Provider, MD  omeprazole-sodium bicarbonate (ZEGERID)  40-1100 MG per capsule TAKE ONE CAPSULE BY MOUTH AT BEDTIME 09/14/11  Yes Yvonne R Lowne, DO  pilocarpine (SALAGEN) 5 MG tablet Take 5 mg by mouth 3 (three) times daily.   Yes Historical Provider, MD  pregabalin (LYRICA) 75 MG capsule 1 pill in AM and 2 at night. 09/15/12  Yes Huston Foley, MD  rizatriptan (MAXALT) 10 MG tablet Take 1 pill daily as needed for migraine. Try not to exceed 10 pills per month. May repeat in 2 hours  if needed 06/05/12  Yes Huston Foley, MD  sucralfate (CARAFATE) 1 G tablet  05/27/12  Yes Historical Provider, MD  Vitamin D, Ergocalciferol, (DRISDOL) 50000 UNITS CAPS  05/14/12  Yes Historical Provider, MD   History   Social History  . Marital Status: Married    Spouse Name: N/A    Number of Children: N/A  . Years of Education: N/A   Occupational History  . Not on file.   Social History Main Topics  . Smoking status: Never Smoker   . Smokeless tobacco: Not on file  . Alcohol Use: Not on file  . Drug Use: Not on file  . Sexual Activity: Not on file   Other Topics Concern  . Not on file   Social History Narrative  . No narrative on file    Review of Systems  Constitutional: Positive for fever (subjective. ) and chills.  Respiratory: Positive for cough. Negative for chest tightness, shortness of breath, wheezing and stridor.   Cardiovascular: Negative for chest pain, palpitations and leg swelling.  Gastrointestinal: Positive for nausea. Negative for abdominal pain (lower - comes and goes at times. ).  Genitourinary: Negative for dysuria, decreased urine volume and difficulty urinating.  Neurological: Positive for dizziness (dizzy and weak today. ) and weakness.  Psychiatric/Behavioral: Positive for confusion (at times, minimally today - a little bit disoriented earlier today at home. ).       Objective:   Physical Exam  Vitals reviewed. Constitutional: She appears well-developed and well-nourished.  Appears fatigued, uncomfortable, but alert, responds appropriately to questions.   HENT:  Head: Normocephalic and atraumatic.  Mouth/Throat: Uvula is midline. Mucous membranes are dry (usual per pt with hx of sjogrens.  drinks water frequently ). No oropharyngeal exudate, posterior oropharyngeal edema or posterior oropharyngeal erythema.  Cardiovascular: An irregularly irregular rhythm present. Tachycardia present.  PMI is not displaced.   Pulmonary/Chest: Effort normal and  breath sounds normal. She has no wheezes. She has no rales.  Distant, but no apparent resp distress, no apparent wheeze/rales/rhonchi  Musculoskeletal:  Moving extremities equally.  Neurological: She is alert.  No focal weakness appreciated.   Skin: Skin is warm and dry. No rash noted. She is not diaphoretic.  Psychiatric: She has a normal mood and affect. Her behavior is normal.   Filed Vitals:   12/06/12 1359  BP: 122/74  Pulse: 123  Temp: 99 F (37.2 C)  TempSrc: Oral  Resp: 17  Height: 5\' 2"  (1.575 m)  Weight: 156 lb (70.761 kg)  SpO2: 96%   EKG: atrial fibrillation with average rate- 95-100, diffuse ST depression.  No prior EKG available for review.  No known hx of arryhtmia in discussion with patient, dtr.     Assessment & Plan:   RUQAYA STRAUSS is a 77 y.o. female  Cough, fatigue for past 2 days, sick contact at home with respiratory illness, but with intermittent confusion/disorientation, new onset atrial fibrillation and  diffuse ST depression on EKG. Denies palpitations or  chest pain.  EMS called for transport, O2NC - 2 liters applied at 1530. Attempted IV x 2 - unsuccessful, but EMS on scene for transfer of care at 1540.  Attempted call to charge nurse at [facility], but unable to reach.

## 2012-12-06 NOTE — H&P (Signed)
[facility] ADMISSION H&P  Chief Complaint: Fatigue/Cough  HPI: 77 yr old WF w/ pmhx significant for Sjogren's syndrome, seizure disorder, HTN, peripheral neuropathy, presents due to fatigue and cough. She presents with her husband and daughter. They state that over the past 6 months her mental status has worsened. She is weaker and needs more assistance.  She has recently has cough and sputum, which they attributed to congestion and a URI.  Her husband had similar symptoms.  She admitted to some lightheadedness as well.  She was found to be in atrial fibrillation in the ED.  Cardiology evaluated the patient an recommended starting xarelto.  Diltiazem was started in the ED and this will be continued. Her rate is currently in 90's to low 100's. She is drowsy, but denies any SOB or CP.  Her husband states she's had chills. CXR without any obvious infiltrate. She does have a leukocytosis of 12k.  Past Medical History  Diagnosis Date  . Hypertension   . Peripheral neuropathy   . Seizures   . Sjogren's syndrome   . Gait disorder 06/05/2012    Past Surgical History  Procedure Laterality Date  . Shoulder open rotator cuff repair    . Lumbar laminectomy    . Abdominal hysterectomy      History reviewed. No pertinent family history. Social History:  reports that she has never smoked. She has never used smokeless tobacco. She reports that she does not drink alcohol or use illicit drugs.  Allergies:  Allergies  Allergen Reactions  . Codeine Nausea Only  . Hydrocodone Nausea And Vomiting  . Morphine And Related Nausea Only  . Oxycodone-Aspirin     REACTION: DEATHLY SICK-VOMITTING     (Not in a hospital admission)  Results for orders placed during the hospital encounter of 12/06/12 (from the past 48 hour(s))  CBC WITH DIFFERENTIAL     Status: Abnormal   Collection Time    12/06/12  5:13 PM      Result Value Range   WBC 12.4 (*) 4.0 - 10.5 K/uL   RBC 4.79  3.87 - 5.11 MIL/uL   Hemoglobin 14.0  12.0 - 15.0 g/dL   HCT 09.8  11.9 - 14.7 %   MCV 83.1  78.0 - 100.0 fL   MCH 29.2  26.0 - 34.0 pg   MCHC 35.2  30.0 - 36.0 g/dL   RDW 82.9  56.2 - 13.0 %   Platelets 137 (*) 150 - 400 K/uL   Neutrophils Relative % 81 (*) 43 - 77 %   Neutro Abs 10.0 (*) 1.7 - 7.7 K/uL   Lymphocytes Relative 10 (*) 12 - 46 %   Lymphs Abs 1.2  0.7 - 4.0 K/uL   Monocytes Relative 9  3 - 12 %   Monocytes Absolute 1.2 (*) 0.1 - 1.0 K/uL   Eosinophils Relative 0  0 - 5 %   Eosinophils Absolute 0.0  0.0 - 0.7 K/uL   Basophils Relative 0  0 - 1 %   Basophils Absolute 0.0  0.0 - 0.1 K/uL  BASIC METABOLIC PANEL     Status: Abnormal   Collection Time    12/06/12  5:13 PM      Result Value Range   Sodium 132 (*) 135 - 145 mEq/L   Potassium 3.3 (*) 3.5 - 5.1 mEq/L   Chloride 94 (*) 96 - 112 mEq/L   CO2 27  19 - 32 mEq/L   Glucose, Bld 108 (*) 70 - 99  mg/dL   BUN 17  6 - 23 mg/dL   Creatinine, Ser 4.09  0.50 - 1.10 mg/dL   Calcium 81.1 (*) 8.4 - 10.5 mg/dL   GFR calc non Af Amer 89 (*) >90 mL/min   GFR calc Af Amer >90  >90 mL/min   Comment: (NOTE)     The eGFR has been calculated using the CKD EPI equation.     This calculation has not been validated in all clinical situations.     eGFR's persistently <90 mL/min signify possible Chronic Kidney     Disease.  TROPONIN I     Status: None   Collection Time    12/06/12  5:13 PM      Result Value Range   Troponin I <0.30  <0.30 ng/mL   Comment:            Due to the release kinetics of cTnI,     a negative result within the first hours     of the onset of symptoms does not rule out     myocardial infarction with certainty.     If myocardial infarction is still suspected,     repeat the test at appropriate intervals.  URINALYSIS, ROUTINE W REFLEX MICROSCOPIC     Status: Abnormal   Collection Time    12/06/12  7:04 PM      Result Value Range   Color, Urine AMBER (*) YELLOW   Comment: BIOCHEMICALS MAY BE AFFECTED BY COLOR   APPearance  CLEAR  CLEAR   Specific Gravity, Urine 1.018  1.005 - 1.030   pH 6.5  5.0 - 8.0   Glucose, UA NEGATIVE  NEGATIVE mg/dL   Hgb urine dipstick SMALL (*) NEGATIVE   Bilirubin Urine SMALL (*) NEGATIVE   Ketones, ur 15 (*) NEGATIVE mg/dL   Protein, ur 914 (*) NEGATIVE mg/dL   Urobilinogen, UA 1.0  0.0 - 1.0 mg/dL   Nitrite NEGATIVE  NEGATIVE   Leukocytes, UA TRACE (*) NEGATIVE  URINE MICROSCOPIC-ADD ON     Status: Abnormal   Collection Time    12/06/12  7:04 PM      Result Value Range   Squamous Epithelial / LPF FEW (*) RARE   WBC, UA 0-2  <3 WBC/hpf   RBC / HPF 3-6  <3 RBC/hpf   Bacteria, UA FEW (*) RARE   Urine-Other MUCOUS PRESENT     Dg Chest 2 View  12/06/2012   CLINICAL DATA:  Atrial fibrillation  EXAM: CHEST  2 VIEW  COMPARISON:  November 07, 2010  FINDINGS: Lungs are clear. Heart is upper normal in size with normal pulmonary vascularity. No adenopathy. Aorta is tortuous. There is mild degenerative change in the thoracic spine.  IMPRESSION: No edema or consolidation.   Electronically Signed   By: Bretta Bang   On: 12/06/2012 18:04    Review of Systems  Constitutional: Positive for malaise/fatigue. Negative for fever, chills and diaphoresis.  HENT: Positive for congestion.   Respiratory: Positive for cough and sputum production. Negative for shortness of breath and wheezing.   Cardiovascular: Negative for chest pain, palpitations, orthopnea and PND.  Gastrointestinal: Positive for nausea. Negative for vomiting, abdominal pain, constipation and blood in stool.  Genitourinary: Negative for dysuria.  Neurological: Positive for dizziness and weakness. Negative for headaches.    Blood pressure 130/74, pulse 99, temperature 99.2 F (37.3 C), temperature source Oral, resp. rate 15, SpO2 96.00%. Physical Exam  Constitutional: She appears well-developed. No distress.  HENT:  Head: Normocephalic and atraumatic.  Eyes: Conjunctivae are normal. Pupils are equal, round, and reactive  to light. Right eye exhibits no discharge.  Neck: Normal range of motion. Neck supple. No JVD present.  Cardiovascular: Normal heart sounds.  An irregularly irregular rhythm present. Exam reveals no friction rub.   No murmur heard. Respiratory: Effort normal and breath sounds normal. She has no decreased breath sounds. She has no wheezes.  GI: Soft. Bowel sounds are normal. She exhibits no distension. There is no tenderness.  Musculoskeletal: Normal range of motion. She exhibits no edema.  Neurological: She is alert. No cranial nerve deficit.  Skin: Skin is dry. She is not diaphoretic.     Assessment/Plan 77 yr old WF w/ pmhx significant for Sjogren's syndrome, seizure disorder, HTN, peripheral neuropathy, presents due to fatigue and cough, with new onset Afib and possibly CAP. 1) New onset Afib: Uncertain onset. Will admit to stepdown and continue diltiazem gtt. She will be started on xarelto. Trop I x3.  TTE ordered. 2) Possible CAP: Given her symptoms and leukocytosis, BC x2 obtained. She is dry on exam (may be related to Sjogren's).  Start Rocephin and Azithro for now. 3) Likely underlying dementia: Family states slow mental decline over past 6 months.  Will need evaluation as outpatient. 4) FEN: K given in ED. Cont NS at 75 cc/hr. 5) Proph: SCDs, on xarelto. 6) Code: FULL  Jonah Blue, DO, FACP 12/06/2012, 9:54 PM

## 2012-12-06 NOTE — Patient Instructions (Signed)
Sent by EMS.  

## 2012-12-06 NOTE — ED Notes (Signed)
Per EMS: pt is from urgent care. Pt husband is sick and pt states she has not been feeling well. Pt c/o weakness and fatigue. Urgent care noticed pt was in a fib. Pt denies hx of afib. Pt c/o cough. Pt denies chest pain and shortness of breath. 18 L AC. 156/98 HR 112 16 RR 98% on 2 L [place]

## 2012-12-06 NOTE — Consult Note (Signed)
Cardiology Consult Note  Admit date: 12/06/2012 Name: Krystal Clarke 77 y.o.  female DOB:  1936/02/09 MRN:  [id number]  Today's date:  12/06/2012  Referring Physician:    [facility]  Primary Physician:    Laurann Montana  Reason for Consultation:    Atrial fibrillation  IMPRESSIONS: 1. Atrial fibrillation of undetermined age of onset 2. Significant upper respiratory infection 3. Some agitation and mild confusion 4. Hypertension 5. Hyperlipidemia 6. Peripheral neuropathy  RECOMMENDATION: She is somewhat agitated and could not tell me today's date. She says that she wants to go home but her daughter states that her husband is not well and has also been sick recently. I would favor an overnight observation to monitor the atrial fibrillation and go ahead and get her started on anticoagulation be sure that her atrial fibrillation rate is controlled.  1. Check echocardiogram 2. Check TSH 3. Anticoagulation with XARELTO would go ahead and start this evening 4. Investigation of delirium that she has had in the past by chart 5. Diltiazem to control rate  HISTORY: This 77 year old female has a history of significant peripheral neuropathy and also has a mild gait disorder that she is under treatment for. She has hypertension as well as hyperlipidemia. She has had chronic nausea the past couple of years and has seen a GI specialist. Her husband has been sick with an upper respiratory infection and she presented to the urgent care Center with cough, upper respiratory infection and was found to be in atrial fibrillation with rapid response. She has been somewhat agitated and complains of feeling hot. She does not have any shortness of breath or chest pain. She has not had any recent edema.  Past Medical History  Diagnosis Date  . Hypertension   . Peripheral neuropathy   . Seizures   . Sjogren's syndrome   . Gait disorder 06/05/2012      Past Surgical History  Procedure  Laterality Date  . Shoulder open rotator cuff repair    . Lumbar laminectomy    . Abdominal hysterectomy       Allergies:  is allergic to codeine; hydrocodone; morphine and related; and oxycodone-aspirin.   Medications: Prior to Admission medications   Medication Sig Start Date End Date Taking? Authorizing Provider  divalproex (DEPAKOTE ER) 250 MG 24 hr tablet Take 250 mg by mouth 2 (two) times daily. 11/18/12  Yes Huston Foley, MD  dorzolamide-timolol (COSOPT) 22.3-6.8 MG/ML ophthalmic solution Place 1 drop into both eyes 2 (two) times daily.    Yes Historical Provider, MD  fenofibrate (TRICOR) 145 MG tablet Take 145 mg by mouth daily.   Yes Historical Provider, MD  LORazepam (ATIVAN) 0.5 MG tablet Take 0.5 mg by mouth every 8 (eight) hours as needed for anxiety.    Yes Historical Provider, MD  meloxicam (MOBIC) 15 MG tablet Take 15 mg by mouth daily.  05/11/12  Yes Historical Provider, MD  metoprolol succinate (TOPROL-XL) 25 MG 24 hr tablet Take 25 mg by mouth daily.   Yes Historical Provider, MD  pregabalin (LYRICA) 75 MG capsule Take 75 mg by mouth 2 (two) times daily. 09/15/12  Yes Huston Foley, MD  rizatriptan (MAXALT) 10 MG tablet Take 1 pill daily as needed for migraine. Try not to exceed 10 pills per month. May repeat in 2 hours if needed 06/05/12  Yes Huston Foley, MD  pilocarpine (SALAGEN) 5 MG tablet Take 5 mg by mouth 3 (three) times daily.    Historical Provider, MD  sucralfate (CARAFATE) 1 G tablet Take 1 g by mouth 4 (four) times daily.  05/27/12   Historical Provider, MD  Vitamin D, Ergocalciferol, (DRISDOL) 50000 UNITS CAPS Take 50,000 Units by mouth every 7 (seven) days. Does not remember what day of the week she takes 05/14/12   Historical Provider, MD    Family History: No family status information on file.    Social History:   reports that she has never smoked. She has never used smokeless tobacco. She reports that she does not drink alcohol or use illicit drugs.   History    Social History Narrative  . No narrative on file    Review of Systems: She has had chronic nausea for the past several years. She has significant glaucoma that she is under treatment for. She denies chest pain or cardiac issues and cannot remember when she had her last EKG. She is complained of feeling severely weak and fatigued over the past week but is much worse today.  Physical Exam: BP 130/74  Pulse 99  Temp(Src) 99.2 F (37.3 C) (Oral)  Resp 15  SpO2 96%  General appearance: Somewhat agitated elderly white female who is coughing actively and somewhat restless Head: Normocephalic, without obvious abnormality, atraumatic Eyes: conjunctivae/corneas clear. PERRL, EOM's intact. Fundi not examined  Throat: Several missing teeth, mucous membranes dry Neck: no adenopathy, no carotid bruit, no JVD and supple, symmetrical, trachea midline Lungs: clear to auscultation bilaterally Heart: Irregular somewhat rapid rhythm, normal S1-S2 no S3 or murmur Abdomen: soft, non-tender; bowel sounds normal; no masses,  no organomegaly Extremities: No edema or cyanosis Pulses: 2+ and symmetric Skin: Skin color, texture, turgor normal. No rashes or lesions Neurologic: Grossly normal   Labs: CBC  Recent Labs  12/06/12 1713  WBC 12.4*  RBC 4.79  HGB 14.0  HCT 39.8  PLT 137*  MCV 83.1  MCH 29.2  MCHC 35.2  RDW 14.0  LYMPHSABS 1.2  MONOABS 1.2*  EOSABS 0.0  BASOSABS 0.0   CMP   Recent Labs  12/06/12 1713  NA 132*  K 3.3*  CL 94*  CO2 27  GLUCOSE 108*  BUN 17  CREATININE 0.55  CALCIUM 10.6*  GFRNONAA 89*  GFRAA >90   Cardiac Panel (last 3 results)  Recent Labs  12/06/12 1713  TROPONINI <0.30     Radiology: Heart upper limits of normal, lungs are clear  EKG: Atrial fibrillation with somewhat rapid ventricular response, ST depression in the lateral leads  Signed:  W. Ashley Royalty MD [facility]   Cardiology Consultant  12/06/2012, 8:30 PM

## 2012-12-06 NOTE — ED Notes (Signed)
Dr Zavitz at bedside  

## 2012-12-06 NOTE — ED Notes (Signed)
Pt returned from radiology and placed back on monitor

## 2012-12-06 NOTE — ED Provider Notes (Signed)
CSN: [id number]     Arrival date & time 12/06/12  1615 History   First MD Initiated Contact with Patient 12/06/12 1632     Chief Complaint  Patient presents with  . Atrial Fibrillation  . Fatigue   (Consider location/radiation/quality/duration/timing/severity/associated sxs/prior Treatment) HPI Comments: 77 yo female with lipids, gerd, sjogrens presents with fatigue, cough, lightheaded for one week.  Husband has URI.  Pt not on abx.  Lightheaded with standing and walking. No cp or sob.  Nothing specific improves.  Mild ha similar to previous migraines, gradual onset.  Tolerating po.  No known arrhythmia hx.    Patient is a 77 y.o. female presenting with atrial fibrillation. The history is provided by the patient and a relative.  Atrial Fibrillation This is a new problem. Pertinent negatives include no chest pain, no abdominal pain, no headaches and no shortness of breath.    Past Medical History  Diagnosis Date  . Hypertension   . Peripheral neuropathy   . Seizures   . Sjogren's syndrome   . Gait disorder 06/05/2012   Past Surgical History  Procedure Laterality Date  . Shoulder open rotator cuff repair    . Lumbar laminectomy    . Abdominal hysterectomy     History reviewed. No pertinent family history. History  Substance Use Topics  . Smoking status: Never Smoker   . Smokeless tobacco: Never Used  . Alcohol Use: No   OB History   Grav Para Term Preterm Abortions TAB SAB Ect Mult Living                 Review of Systems  Constitutional: Positive for fatigue. Negative for fever and chills.  HENT: Negative for neck pain and neck stiffness.   Eyes: Negative for visual disturbance.  Respiratory: Positive for cough. Negative for shortness of breath.   Cardiovascular: Negative for chest pain.  Gastrointestinal: Negative for vomiting and abdominal pain.  Genitourinary: Negative for dysuria and flank pain.  Musculoskeletal: Negative for back pain.  Skin: Negative for rash.   Neurological: Positive for weakness and light-headedness. Negative for headaches.    Allergies  Codeine; Hydrocodone; Morphine and related; and Oxycodone-aspirin  Home Medications   Current Outpatient Rx  Name  Route  Sig  Dispense  Refill  . divalproex (DEPAKOTE ER) 250 MG 24 hr tablet   Oral   Take 250 mg by mouth 2 (two) times daily.         . dorzolamide-timolol (COSOPT) 22.3-6.8 MG/ML ophthalmic solution   Both Eyes   Place 1 drop into both eyes 2 (two) times daily.          . fenofibrate (TRICOR) 145 MG tablet   Oral   Take 145 mg by mouth daily.         Marland Kitchen LORazepam (ATIVAN) 0.5 MG tablet   Oral   Take 0.5 mg by mouth every 8 (eight) hours as needed for anxiety.          . meloxicam (MOBIC) 15 MG tablet   Oral   Take 15 mg by mouth daily.          . metoprolol succinate (TOPROL-XL) 25 MG 24 hr tablet   Oral   Take 25 mg by mouth daily.         . pregabalin (LYRICA) 75 MG capsule   Oral   Take 75 mg by mouth 2 (two) times daily.         . rizatriptan (MAXALT) 10 MG tablet  Take 1 pill daily as needed for migraine. Try not to exceed 10 pills per month. May repeat in 2 hours if needed   30 tablet   3   . pilocarpine (SALAGEN) 5 MG tablet   Oral   Take 5 mg by mouth 3 (three) times daily.         . sucralfate (CARAFATE) 1 G tablet   Oral   Take 1 g by mouth 4 (four) times daily.          . Vitamin D, Ergocalciferol, (DRISDOL) 50000 UNITS CAPS   Oral   Take 50,000 Units by mouth every 7 (seven) days. Does not remember what day of the week she takes          BP 130/74  Pulse 99  Temp(Src) 99.2 F (37.3 C) (Oral)  Resp 15  SpO2 96% Physical Exam  Nursing note and vitals reviewed. Constitutional: She is oriented to person, place, and time. She appears well-developed and well-nourished.  HENT:  Head: Normocephalic and atraumatic.   Mod dry mm  Eyes: Conjunctivae are normal. Right eye exhibits no discharge. Left eye exhibits  no discharge.  Neck: Normal range of motion. Neck supple. No tracheal deviation present.  Cardiovascular: An irregularly irregular rhythm present. Tachycardia present.   Pulses:      Radial pulses are 2+ on the right side, and 2+ on the left side.  Pulmonary/Chest: Effort normal and breath sounds normal.  Abdominal: Soft. She exhibits no distension. There is no tenderness. There is no guarding.  Musculoskeletal: She exhibits no edema.  Neurological: She is alert and oriented to person, place, and time. GCS eye subscore is 4. GCS verbal subscore is 5. GCS motor subscore is 6.  5+ strength in UE and LE with f/e at major joints. Sensation to palpation intact in UE and LE. CNs 2-12 grossly intact.  EOMFI.  PERRL.   Finger nose and coordination intact bilateral.   Visual fields intact to finger testing.   Skin: Skin is warm. No rash noted.  Psychiatric: She has a normal mood and affect.    ED Course  Procedures (including critical care time) Labs Review Labs Reviewed  CBC WITH DIFFERENTIAL - Abnormal; Notable for the following:    WBC 12.4 (*)    Platelets 137 (*)    Neutrophils Relative % 81 (*)    Neutro Abs 10.0 (*)    Lymphocytes Relative 10 (*)    Monocytes Absolute 1.2 (*)    All other components within normal limits  BASIC METABOLIC PANEL - Abnormal; Notable for the following:    Sodium 132 (*)    Potassium 3.3 (*)    Chloride 94 (*)    Glucose, Bld 108 (*)    Calcium 10.6 (*)    GFR calc non Af Amer 89 (*)    All other components within normal limits  URINALYSIS, ROUTINE W REFLEX MICROSCOPIC - Abnormal; Notable for the following:    Color, Urine AMBER (*)    Hgb urine dipstick SMALL (*)    Bilirubin Urine SMALL (*)    Ketones, ur 15 (*)    Protein, ur 100 (*)    Leukocytes, UA TRACE (*)    All other components within normal limits  URINE MICROSCOPIC-ADD ON - Abnormal; Notable for the following:    Squamous Epithelial / LPF FEW (*)    Bacteria, UA FEW (*)    All  other components within normal limits  TROPONIN I  TSH  Imaging Review Dg Chest 2 View  12/06/2012   CLINICAL DATA:  Atrial fibrillation  EXAM: CHEST  2 VIEW  COMPARISON:  November 07, 2010  FINDINGS: Lungs are clear. Heart is upper normal in size with normal pulmonary vascularity. No adenopathy. Aorta is tortuous. There is mild degenerative change in the thoracic spine.  IMPRESSION: No edema or consolidation.   Electronically Signed   By: Bretta Bang   On: 12/06/2012 18:04    MDM   1. Atrial fibrillation   2. Fatigue   3. Bronchitis   4. Hypokalemia   5. Dehydration    New onset a fib.  Mild improvement with fluids.  Date: 12/06/2012  Rate: 99  Rhythm: atrial fibrillation  QRS Axis: indeterminate  Intervals: QT prolonged  ST/T Wave abnormalities: nonspecific ST changes  Conduction Disutrbances:a fib  Narrative Interpretation:   Old EKG Reviewed: changes noted   Date: 12/06/2012  Rate: 95  Rhythm: atrial fibrillation  QRS Axis: normal  Intervals: QT prolonged  ST/T Wave abnormalities: nonspecific ST changes  Conduction Disutrbances:a fib  Narrative Interpretation:   Old EKG Reviewed: changes noted   PO K given. Pt initially refused admission then changed her mind. Cardiology consulted for management inpt/ blood thinners/ echo, rec med admit Medicine consulted for general weakness and further eval/ for admission.    Enid Skeens, MD 12/06/12 2105

## 2012-12-07 DIAGNOSIS — E871 Hypo-osmolality and hyponatremia: Secondary | ICD-10-CM | POA: Insufficient documentation

## 2012-12-07 DIAGNOSIS — E860 Dehydration: Secondary | ICD-10-CM

## 2012-12-07 DIAGNOSIS — I4891 Unspecified atrial fibrillation: Secondary | ICD-10-CM | POA: Diagnosis not present

## 2012-12-07 DIAGNOSIS — G40909 Epilepsy, unspecified, not intractable, without status epilepticus: Secondary | ICD-10-CM | POA: Diagnosis present

## 2012-12-07 DIAGNOSIS — I517 Cardiomegaly: Secondary | ICD-10-CM

## 2012-12-07 HISTORY — DX: Hypo-osmolality and hyponatremia: E87.1

## 2012-12-07 HISTORY — DX: Dehydration: E86.0

## 2012-12-07 LAB — CBC
MCV: 84.7 fL (ref 78.0–100.0)
Platelets: 143 10*3/uL — ABNORMAL LOW (ref 150–400)
RBC: 4.3 MIL/uL (ref 3.87–5.11)
RDW: 14.2 % (ref 11.5–15.5)
WBC: 12 10*3/uL — ABNORMAL HIGH (ref 4.0–10.5)

## 2012-12-07 LAB — TROPONIN I
Troponin I: <0.3 ng/mL (ref <0.30)
Troponin I: <0.3 ng/mL (ref <0.30)
Troponin I: <0.3 ng/mL (ref <0.30)

## 2012-12-07 LAB — COMPREHENSIVE METABOLIC PANEL
AST: 20 U/L (ref 0–37)
Albumin: 3.3 g/dL — ABNORMAL LOW (ref 3.5–5.2)
BUN: 14 mg/dL (ref 6–23)
Calcium: 9.9 mg/dL (ref 8.4–10.5)
Chloride: 95 mEq/L — ABNORMAL LOW (ref 96–112)
Creatinine, Ser: 0.53 mg/dL (ref 0.50–1.10)
GFR calc non Af Amer: >90 mL/min (ref >90)
Sodium: 128 mEq/L — ABNORMAL LOW (ref 135–145)
Total Bilirubin: 0.4 mg/dL (ref 0.3–1.2)

## 2012-12-07 LAB — MRSA PCR SCREENING: MRSA by PCR: NEGATIVE

## 2012-12-07 MED ORDER — ALUM & MAG HYDROXIDE-SIMETH 200-200-20 MG/5ML PO SUSP
30.0000 mL | ORAL | Status: DC | PRN
  Administered 2012-12-07: 30 mL via ORAL
  Filled 2012-12-07: qty 30

## 2012-12-07 MED ORDER — SODIUM CHLORIDE 0.9 % IV SOLN
INTRAVENOUS | Status: DC
  Administered 2012-12-07 – 2012-12-09 (×3): via INTRAVENOUS

## 2012-12-07 MED ORDER — DILTIAZEM HCL 30 MG PO TABS
30.0000 mg | ORAL_TABLET | Freq: Four times a day (QID) | ORAL | Status: DC
  Administered 2012-12-07 – 2012-12-09 (×8): 30 mg via ORAL
  Filled 2012-12-07 (×12): qty 1

## 2012-12-07 MED ORDER — SUMATRIPTAN SUCCINATE 50 MG PO TABS
50.0000 mg | ORAL_TABLET | ORAL | Status: DC | PRN
  Administered 2012-12-07: 50 mg via ORAL
  Filled 2012-12-07: qty 1

## 2012-12-07 MED ORDER — SODIUM CHLORIDE 0.9 % IV SOLN
INTRAVENOUS | Status: DC
  Administered 2012-12-07: 02:00:00 via INTRAVENOUS

## 2012-12-07 MED ORDER — ACETAMINOPHEN 500 MG PO TABS
500.0000 mg | ORAL_TABLET | Freq: Four times a day (QID) | ORAL | Status: DC | PRN
  Administered 2012-12-07 – 2012-12-09 (×3): 500 mg via ORAL
  Filled 2012-12-07 (×3): qty 1

## 2012-12-07 MED ORDER — DOCUSATE SODIUM 100 MG PO CAPS
100.0000 mg | ORAL_CAPSULE | Freq: Once | ORAL | Status: AC
  Administered 2012-12-08: 100 mg via ORAL
  Filled 2012-12-07: qty 1

## 2012-12-07 MED ORDER — CHLORHEXIDINE GLUCONATE 0.12 % MT SOLN
15.0000 mL | Freq: Two times a day (BID) | OROMUCOSAL | Status: DC
  Administered 2012-12-07 – 2012-12-10 (×7): 15 mL via OROMUCOSAL
  Filled 2012-12-07 (×9): qty 15

## 2012-12-07 MED ORDER — PNEUMOCOCCAL VAC POLYVALENT 25 MCG/0.5ML IJ INJ
0.5000 mL | INJECTION | INTRAMUSCULAR | Status: AC
  Administered 2012-12-08: 0.5 mL via INTRAMUSCULAR
  Filled 2012-12-07: qty 0.5

## 2012-12-07 MED ORDER — PERFLUTREN LIPID MICROSPHERE
1.0000 mL | INTRAVENOUS | Status: AC | PRN
  Administered 2012-12-07: 2 mL via INTRAVENOUS
  Filled 2012-12-07: qty 10

## 2012-12-07 MED ORDER — BIOTENE DRY MOUTH MT LIQD
15.0000 mL | Freq: Two times a day (BID) | OROMUCOSAL | Status: DC
  Administered 2012-12-07 – 2012-12-08 (×3): 15 mL via OROMUCOSAL

## 2012-12-07 NOTE — Progress Notes (Signed)
Utilization Review Completed.Krystal Clarke T9/22/2014  

## 2012-12-07 NOTE — Progress Notes (Signed)
[facility] Progress Note [place] TEAM 1 - Stepdown ICU Team   Krystal Clarke AVW:[id number] DOB: August 28, 1935 DOA: 12/06/2012 PCP: Cala Bradford, MD  Brief narrative: 77 year old female patient with Sjogren's syndrome as well as seizure disorder and hypertension who presented because of issues related to progressive fatigue and cough and failure to thrive. In addition to the acute problems the family had noted over the previous 6 months patient's mental status had worsened. The patient's husband had recently had similar upper respiratory infection symptoms so they attributed the patient's symptomatology to the same process. Unfortunately the patient's symptoms were much worse and associated with weakness and lightheadedness. Upon arrival to the ER the patient was found to be in atrial fibrillation with rapid ventricular response. She was evaluated by cardiology who recommended starting Xarelto as well as IV Cardizem. In review of her laboratory data she had leukocytosis and low-grade fevers without any infiltrate on chest x-ray. She was not experiencing any chest pain or shortness of breath at that time either.  Assessment/Plan:  Atrial fibrillation with RVR -possibly mediated by [facility] and stress of URI -rate controlled- Cards transitioning her to PO CCB -continue Xarelto- will need CM to clarify copay -FU on ECHO -TSH normal  Acute respiratory failure with hypoxia -etiology unclear but suspect viral etiology -continue empiric anbx's to cover possible secondary bacterial infection -once better hydrated consider repeat CXR  Dehydration with hyponatremia -continue IVF and repeat BMET in am  HYPERTENSION -controlled/home BB on hold infavor of CCB for rate control  Sjogren's syndrome  ANEMIA, B12 DEFICIENCY -check anemia panel  MIGRAINE HEADACHE -resume home Maxalt -provide Tylenol -hold home Mobic   Peripheral neuropathy  Seizure disorder -continue Depakote  DVT  prophylaxis: SCDs Code Status: Full Family Communication: Patient and family at bedside Disposition Plan/Expected LOS: Remain in step down  Consultants: Cardiology  Procedures: 2-D echocardiogram - pending  Antibiotics: Rocephin 9/21 >>> Zithromax 9/21 >>>  HPI/Subjective: Patient is awake and complaining of fatigue states did not sleep well last night due to necessary treatments that occurred since admission. Still has wet sounding cough but denies shortness of breath. Still feels weak. Still has poor appetite.  Objective: Blood pressure 137/60, pulse 82, temperature 97.6 F (36.4 C), temperature source Oral, resp. rate 16, height 5\' 2"  (1.575 m), weight 71.5 kg (157 lb 10.1 oz), SpO2 95.00%.  Intake/Output Summary (Last 24 hours) at 12/07/12 1750 Last data filed at 12/07/12 1600  Gross per 24 hour  Intake 1534.08 ml  Output   1300 ml  Net 234.08 ml   Exam: General: No acute respiratory distress Lungs: Clear to auscultation bilaterally without wheezes or crackles, 3 L Cardiovascular: Irregular rate/atrial fibrillation without murmur gallop or rub normal S1 and S2, no peripheral edema or JVD Abdomen: Nontender, nondistended, soft, bowel sounds positive, no rebound, no ascites, no appreciable mass Musculoskeletal: No significant cyanosis, clubbing of bilateral lower extremities Neurological: Alert and oriented x 3, moves all extremities x 4 without focal neurological deficits, CN 2-12 intact  Scheduled Meds:  Scheduled Meds: . antiseptic oral rinse  15 mL Mouth Rinse q12n4p  . azithromycin  500 mg Intravenous Q24H  . cefTRIAXone (ROCEPHIN)  IV  1 g Intravenous Q24H  . chlorhexidine  15 mL Mouth Rinse BID  . diltiazem  30 mg Oral Q6H  . divalproex  250 mg Oral BID  . dorzolamide-timolol  1 drop Both Eyes BID  . fenofibrate  160 mg Oral Daily  . pilocarpine  5 mg Oral  TID  . [START ON 12/08/2012] pneumococcal 23 valent vaccine  0.5 mL Intramuscular Tomorrow-1000  .  pregabalin  75 mg Oral BID  . rivaroxaban  20 mg Oral Daily  . sodium chloride  3 mL Intravenous Q12H  . sucralfate  1 g Oral QID  . Vitamin D (Ergocalciferol)  50,000 Units Oral Q7 days   Data Reviewed: Basic Metabolic Panel:  Recent Labs Lab 12/06/12 1713 12/07/12 0635  NA 132* 128*  K 3.3* 3.7  CL 94* 95*  CO2 27 22  GLUCOSE 108* 99  BUN 17 14  CREATININE 0.55 0.53  CALCIUM 10.6* 9.9   Liver Function Tests:  Recent Labs Lab 12/07/12 0635  AST 20  ALT 10  ALKPHOS 62  BILITOT 0.4  PROT 6.2  ALBUMIN 3.3*   No results found for this basename: LIPASE, AMYLASE,  in the last 168 hours No results found for this basename: AMMONIA,  in the last 168 hours CBC:  Recent Labs Lab 12/06/12 1713 12/07/12 0635  WBC 12.4* 12.0*  NEUTROABS 10.0*  --   HGB 14.0 12.5  HCT 39.8 36.4  MCV 83.1 84.7  PLT 137* 143*   Cardiac Enzymes:  Recent Labs Lab 12/06/12 1713 12/06/12 2340 12/07/12 0635 12/07/12 1226  TROPONINI <0.30 <0.30 <0.30 <0.30     Recent Results (from the past 240 hour(s))  MRSA PCR SCREENING     Status: None   Collection Time    12/06/12 11:03 PM      Result Value Range Status   MRSA by PCR NEGATIVE  NEGATIVE Final   Comment:            The GeneXpert MRSA Assay (FDA     approved for NASAL specimens     only), is one component of a     comprehensive MRSA colonization     surveillance program. It is not     intended to diagnose MRSA     infection nor to guide or     monitor treatment for     MRSA infections.     Studies:  Recent x-ray studies have been reviewed in detail by the Attending Physician    Junious Silk, ANP [facility] Office  [phone] Pager [phone]  **If unable to reach the above provider after paging please contact the Flow Manager @ [phone]  On-Call/Text Page:      [email or web address]      password [email or web address]  If 7PM-7AM, please contact night-coverage [email or web address] Password [email or web address] 12/07/2012, 5:50 PM   LOS: 1 day     I have personally examined this patient and reviewed the entire database. I have reviewed the above note, made any necessary editorial changes, and agree with its content.  Lonia Blood, MD [facility]

## 2012-12-07 NOTE — Evaluation (Signed)
Physical Therapy Evaluation Patient Details Name: Krystal Clarke MRN: [id number] DOB: October 09, 1935 Today's Date: 12/07/2012 Time: 1440-1500 PT Time Calculation (min): 20 min  PT Assessment / Plan / Recommendation History of Present Illness  77 year old female patient with Sjogren's syndrome as well as seizure disorder hypertension. Presented because of issues related to progressive fatigue and cough and failure to thrive. Upon arrival to the ER the patient was found to be in atrial fibrillation with rapid ventricular response.  Clinical Impression  Pt admitted with a. Pt currently with functional limitations due to the deficits listed below (see PT Problem List).  Pt will benefit from skilled PT to increase their independence and safety with mobility to allow discharge to the venue listed below.       PT Assessment  Patient needs continued PT services    Follow Up Recommendations  Home health PT    Does the patient have the potential to tolerate intense rehabilitation      Barriers to Discharge        Equipment Recommendations  None recommended by PT    Recommendations for Other Services     Frequency Min 3X/week    Precautions / Restrictions Precautions Precautions: Fall   Pertinent Vitals/Pain HR to 115 with amb.  SaO2 96% on RA with amb.  Left O2 off and nurse aware.      Mobility  Bed Mobility Bed Mobility: Supine to Sit;Sitting - Scoot to Edge of Bed Supine to Sit: 5: Supervision;HOB elevated;With rails Sitting - Scoot to Edge of Bed: 5: Supervision Details for Bed Mobility Assistance: Incr time  Transfers Transfers: Sit to Stand;Stand to Sit Sit to Stand: 4: Min assist;With upper extremity assist;From bed Stand to Sit: To chair/3-in-1;4: Min guard Details for Transfer Assistance: Verbal cues for hand placement Ambulation/Gait Ambulation/Gait Assistance: 4: Min assist Ambulation Distance (Feet): 75 Feet Assistive device: Rolling walker Ambulation/Gait  Assistance Details: Assist for steering of walker Gait Pattern: Step-through pattern;Decreased stride length Gait velocity: decr    Exercises     PT Diagnosis: Difficulty walking;Generalized weakness  PT Problem List: Decreased strength;Decreased activity tolerance;Decreased balance;Decreased mobility;Decreased knowledge of use of DME PT Treatment Interventions: DME instruction;Gait training;Functional mobility training;Therapeutic activities;Therapeutic exercise;Balance training;Patient/family education     PT Goals(Current goals can be found in the care plan section) Acute Rehab PT Goals Patient Stated Goal: Return home PT Goal Formulation: With patient Time For Goal Achievement: 12/14/12 Potential to Achieve Goals: Good  Visit Information  Last PT Received On: 12/07/12 Assistance Needed: +1 History of Present Illness: 77 year old female patient with Sjogren's syndrome as well as seizure disorder hypertension. Presented because of issues related to progressive fatigue and cough and failure to thrive. Upon arrival to the ER the patient was found to be in atrial fibrillation with rapid ventricular response.       Prior Functioning  Home Living Family/patient expects to be discharged to:: Private residence Living Arrangements: Spouse/significant other Available Help at Discharge: Available 24 hours/day Type of Home: House Home Access: Level entry Home Layout: One level Home Equipment: Walker - 4 wheels;Cane - single point Prior Function Level of Independence: Independent with assistive device(s) Comments: Amb with rollator or cane. Communication Communication: No difficulties    Cognition  Cognition Arousal/Alertness: Awake/alert Behavior During Therapy: WFL for tasks assessed/performed Overall Cognitive Status: Within Functional Limits for tasks assessed    Extremity/Trunk Assessment Upper Extremity Assessment Upper Extremity Assessment: Generalized weakness Lower  Extremity Assessment Lower Extremity Assessment: Generalized weakness   Balance Balance  Balance Assessed: Yes Static Standing Balance Static Standing - Balance Support: Bilateral upper extremity supported Static Standing - Level of Assistance: 5: Stand by assistance  End of Session PT - End of Session Equipment Utilized During Treatment: Gait belt Activity Tolerance: Patient tolerated treatment well Patient left: in chair;with call bell/phone within reach;with family/visitor present Nurse Communication: Mobility status  GP     Krystal Clarke 12/07/2012, 3:27 PM  [employer] PT [phone]

## 2012-12-07 NOTE — Care Management Note (Addendum)
    Page 1 of 2   12/10/2012     4:03:38 PM   CARE MANAGEMENT NOTE 12/10/2012  Patient:  Krystal Clarke, Krystal Clarke   Account Number:  [id number]  Date Initiated:  12/07/2012  Documentation initiated by:  Junius Creamer  Subjective/Objective Assessment:   adm w at fib     Action/Plan:   lives w husband, pcp dr Erling Conte white   Anticipated DC Date:  12/11/2012   Anticipated DC Plan:  HOME W HOME HEALTH SERVICES      DC Planning Services  CM consult  Medication Assistance      [facility] Choice  HOME HEALTH   Choice offered to / List presented to:  C-1 Patient        HH arranged  HH-1 RN      [facility] agency  [facility]   Status of service:  Completed, signed off Medicare Important Message given?   (If response is "NO", the following Medicare IM given date fields will be blank) Date Medicare IM given:   Date Additional Medicare IM given:    Discharge Disposition:  HOME W HOME HEALTH SERVICES  Per UR Regulation:  Reviewed for med. necessity/level of care/duration of stay  If discussed at Long Length of Stay Meetings, dates discussed:    Comments:  12/10/12 Kyzer Blowe,RN,BSN [phone] PT FOR DC HOME TODAY WITH SPOUSE.  NEEDS HH FOLLOW UP, PER PHYSICAL THERAPIST.  PT AGREEABLE TO HHPT; REFERRAL TO [facility], PER PT CHOICE.  START OF CARE 24-48H POST DC DATE.  PT HAS RW AT HOME, IF NEEDED.  9/22 1110 a debbie dowell rn,bsn gave pt 30day free xarelto card. pt has 58.64 per month copay. alerted husband of this. he states he also takes xarelto and very familiar w med.

## 2012-12-07 NOTE — Progress Notes (Signed)
SUBJECTIVE:  Somnolent.  No acute SOB.  No pain.     PHYSICAL EXAM Filed Vitals:   12/07/12 0800 12/07/12 0849 12/07/12 0900 12/07/12 1000  BP: 116/64  114/74 120/54  Pulse: 80 91 90 73  Temp:  98 F (36.7 C)    TempSrc:      Resp: 14 15 17 17   Height:      Weight:      SpO2: 95% 97% 98% 98%   General:  No acute distress Lungs:  Clear Heart:  RRR Abdomen:  Positive bowel sounds, no rebound no guarding Extremities:  No edema   LABS: Lab Results  Component Value Date   TROPONINI <0.30 12/07/2012   Results for orders placed during the hospital encounter of 12/06/12 (from the past 24 hour(s))  CBC WITH DIFFERENTIAL     Status: Abnormal   Collection Time    12/06/12  5:13 PM      Result Value Range   WBC 12.4 (*) 4.0 - 10.5 K/uL   RBC 4.79  3.87 - 5.11 MIL/uL   Hemoglobin 14.0  12.0 - 15.0 g/dL   HCT 84.1  32.4 - 40.1 %   MCV 83.1  78.0 - 100.0 fL   MCH 29.2  26.0 - 34.0 pg   MCHC 35.2  30.0 - 36.0 g/dL   RDW 02.7  25.3 - 66.4 %   Platelets 137 (*) 150 - 400 K/uL   Neutrophils Relative % 81 (*) 43 - 77 %   Neutro Abs 10.0 (*) 1.7 - 7.7 K/uL   Lymphocytes Relative 10 (*) 12 - 46 %   Lymphs Abs 1.2  0.7 - 4.0 K/uL   Monocytes Relative 9  3 - 12 %   Monocytes Absolute 1.2 (*) 0.1 - 1.0 K/uL   Eosinophils Relative 0  0 - 5 %   Eosinophils Absolute 0.0  0.0 - 0.7 K/uL   Basophils Relative 0  0 - 1 %   Basophils Absolute 0.0  0.0 - 0.1 K/uL  BASIC METABOLIC PANEL     Status: Abnormal   Collection Time    12/06/12  5:13 PM      Result Value Range   Sodium 132 (*) 135 - 145 mEq/L   Potassium 3.3 (*) 3.5 - 5.1 mEq/L   Chloride 94 (*) 96 - 112 mEq/L   CO2 27  19 - 32 mEq/L   Glucose, Bld 108 (*) 70 - 99 mg/dL   BUN 17  6 - 23 mg/dL   Creatinine, Ser 4.03  0.50 - 1.10 mg/dL   Calcium 47.4 (*) 8.4 - 10.5 mg/dL   GFR calc non Af Amer 89 (*) >90 mL/min   GFR calc Af Amer >90  >90 mL/min  TROPONIN I     Status: None   Collection Time    12/06/12  5:13 PM      Result  Value Range   Troponin I <0.30  <0.30 ng/mL  URINALYSIS, ROUTINE W REFLEX MICROSCOPIC     Status: Abnormal   Collection Time    12/06/12  7:04 PM      Result Value Range   Color, Urine AMBER (*) YELLOW   APPearance CLEAR  CLEAR   Specific Gravity, Urine 1.018  1.005 - 1.030   pH 6.5  5.0 - 8.0   Glucose, UA NEGATIVE  NEGATIVE mg/dL   Hgb urine dipstick SMALL (*) NEGATIVE   Bilirubin Urine SMALL (*) NEGATIVE   Ketones, ur 15 (*)  NEGATIVE mg/dL   Protein, ur 161 (*) NEGATIVE mg/dL   Urobilinogen, UA 1.0  0.0 - 1.0 mg/dL   Nitrite NEGATIVE  NEGATIVE   Leukocytes, UA TRACE (*) NEGATIVE  URINE MICROSCOPIC-ADD ON     Status: Abnormal   Collection Time    12/06/12  7:04 PM      Result Value Range   Squamous Epithelial / LPF FEW (*) RARE   WBC, UA 0-2  <3 WBC/hpf   RBC / HPF 3-6  <3 RBC/hpf   Bacteria, UA FEW (*) RARE   Urine-Other MUCOUS PRESENT    MRSA PCR SCREENING     Status: None   Collection Time    12/06/12 11:03 PM      Result Value Range   MRSA by PCR NEGATIVE  NEGATIVE  TROPONIN I     Status: None   Collection Time    12/06/12 11:40 PM      Result Value Range   Troponin I <0.30  <0.30 ng/mL  COMPREHENSIVE METABOLIC PANEL     Status: Abnormal   Collection Time    12/07/12  6:35 AM      Result Value Range   Sodium 128 (*) 135 - 145 mEq/L   Potassium 3.7  3.5 - 5.1 mEq/L   Chloride 95 (*) 96 - 112 mEq/L   CO2 22  19 - 32 mEq/L   Glucose, Bld 99  70 - 99 mg/dL   BUN 14  6 - 23 mg/dL   Creatinine, Ser 0.96  0.50 - 1.10 mg/dL   Calcium 9.9  8.4 - 04.5 mg/dL   Total Protein 6.2  6.0 - 8.3 g/dL   Albumin 3.3 (*) 3.5 - 5.2 g/dL   AST 20  0 - 37 U/L   ALT 10  0 - 35 U/L   Alkaline Phosphatase 62  39 - 117 U/L   Total Bilirubin 0.4  0.3 - 1.2 mg/dL   GFR calc non Af Amer >90  >90 mL/min   GFR calc Af Amer >90  >90 mL/min  CBC     Status: Abnormal   Collection Time    12/07/12  6:35 AM      Result Value Range   WBC 12.0 (*) 4.0 - 10.5 K/uL   RBC 4.30  3.87 - 5.11  MIL/uL   Hemoglobin 12.5  12.0 - 15.0 g/dL   HCT 40.9  81.1 - 91.4 %   MCV 84.7  78.0 - 100.0 fL   MCH 29.1  26.0 - 34.0 pg   MCHC 34.3  30.0 - 36.0 g/dL   RDW 78.2  95.6 - 21.3 %   Platelets 143 (*) 150 - 400 K/uL  TROPONIN I     Status: None   Collection Time    12/07/12  6:35 AM      Result Value Range   Troponin I <0.30  <0.30 ng/mL    Intake/Output Summary (Last 24 hours) at 12/07/12 1122 Last data filed at 12/07/12 1000  Gross per 24 hour  Intake 1334.08 ml  Output    700 ml  Net 634.08 ml    ASSESSMENT AND PLAN:  Atrial fibrillation:  Echo pending.  Rate is OK.  As below, I will switch to oral cardizem.   On Xarelto.   HYPERTENSION:  BP OK.  We will change back to oral Cardizem.    CAP (community acquired pneumonia):  Plan per primary team.     Rollene Rotunda 12/07/2012 11:22 AM

## 2012-12-07 NOTE — Progress Notes (Signed)
Echocardiogram 2D Echocardiogram with Definity has been performed.  Krystal Clarke 12/07/2012, 12:52 PM

## 2012-12-08 ENCOUNTER — Encounter (HOSPITAL_COMMUNITY): Payer: Self-pay | Admitting: Neurology

## 2012-12-08 ENCOUNTER — Inpatient Hospital Stay (HOSPITAL_COMMUNITY): Payer: Medicare Other

## 2012-12-08 DIAGNOSIS — M3500 Sicca syndrome, unspecified: Secondary | ICD-10-CM | POA: Diagnosis not present

## 2012-12-08 DIAGNOSIS — R109 Unspecified abdominal pain: Secondary | ICD-10-CM | POA: Diagnosis not present

## 2012-12-08 DIAGNOSIS — I4891 Unspecified atrial fibrillation: Secondary | ICD-10-CM | POA: Diagnosis not present

## 2012-12-08 DIAGNOSIS — J189 Pneumonia, unspecified organism: Secondary | ICD-10-CM | POA: Diagnosis not present

## 2012-12-08 LAB — FOLATE: Folate: 8.1 ng/mL

## 2012-12-08 LAB — RETICULOCYTES
Retic Count, Absolute: 58.2 10*3/uL (ref 19.0–186.0)
Retic Ct Pct: 1.4 % (ref 0.4–3.1)

## 2012-12-08 LAB — BASIC METABOLIC PANEL
CO2: 26 mEq/L (ref 19–32)
Calcium: 9.8 mg/dL (ref 8.4–10.5)
Chloride: 94 mEq/L — ABNORMAL LOW (ref 96–112)
Creatinine, Ser: 0.57 mg/dL (ref 0.50–1.10)
GFR calc Af Amer: >90 mL/min (ref >90)
Glucose, Bld: 116 mg/dL — ABNORMAL HIGH (ref 70–99)
Sodium: 130 mEq/L — ABNORMAL LOW (ref 135–145)

## 2012-12-08 LAB — URINE MICROSCOPIC-ADD ON

## 2012-12-08 LAB — URINALYSIS, ROUTINE W REFLEX MICROSCOPIC
Bilirubin Urine: NEGATIVE
Glucose, UA: NEGATIVE mg/dL
Leukocytes, UA: NEGATIVE
Protein, ur: 100 mg/dL — AB
pH: 7.5 (ref 5.0–8.0)

## 2012-12-08 LAB — CBC
Hemoglobin: 11.9 g/dL — ABNORMAL LOW (ref 12.0–15.0)
MCH: 28.6 pg (ref 26.0–34.0)
MCHC: 34 g/dL (ref 30.0–36.0)
MCV: 84.1 fL (ref 78.0–100.0)
Platelets: 130 10*3/uL — ABNORMAL LOW (ref 150–400)
RBC: 4.16 MIL/uL (ref 3.87–5.11)
RDW: 13.9 % (ref 11.5–15.5)
WBC: 14 10*3/uL — ABNORMAL HIGH (ref 4.0–10.5)

## 2012-12-08 LAB — IRON AND TIBC: UIBC: 378 ug/dL (ref 125–400)

## 2012-12-08 LAB — VITAMIN B12: Vitamin B-12: 297 pg/mL (ref 211–911)

## 2012-12-08 MED ORDER — AZITHROMYCIN 500 MG PO TABS
500.0000 mg | ORAL_TABLET | Freq: Every day | ORAL | Status: DC
  Administered 2012-12-08: 500 mg via ORAL
  Filled 2012-12-08 (×2): qty 1

## 2012-12-08 MED ORDER — RIVAROXABAN 20 MG PO TABS
20.0000 mg | ORAL_TABLET | Freq: Every day | ORAL | Status: DC
  Administered 2012-12-09: 20 mg via ORAL
  Filled 2012-12-08 (×2): qty 1
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
## 2012-12-08 NOTE — Progress Notes (Signed)
SUBJECTIVE:  No acute SOB.  She thinks that she might be breathing better.  She is complaining of lower abdominal or suprapubic pain.    PHYSICAL EXAM Filed Vitals:   12/07/12 1932 12/07/12 1934 12/07/12 2325 12/08/12 0448  BP: 143/74  149/68 122/73  Pulse:      Temp: 98.9 F (37.2 C)  99.2 F (37.3 C) 98.6 F (37 C)  TempSrc: Oral  Oral Oral  Resp: 19  23 16   Height:      Weight:      SpO2: 90% 95% 96% 95%   General:  No acute distress Lungs:  Clear Heart:  Irregular Abdomen:  Positive bowel sounds, no rebound no guarding, tenderness to palpation lower abdomen.  Extremities:  No edema   LABS: Lab Results  Component Value Date   TROPONINI <0.30 12/07/2012   Results for orders placed during the hospital encounter of 12/06/12 (from the past 24 hour(s))  TROPONIN I     Status: None   Collection Time    12/07/12 12:26 PM      Result Value Range   Troponin I <0.30  <0.30 ng/mL  RETICULOCYTES     Status: None   Collection Time    12/08/12  5:15 AM      Result Value Range   Retic Ct Pct 1.4  0.4 - 3.1 %   RBC. 4.16  3.87 - 5.11 MIL/uL   Retic Count, Manual 58.2  19.0 - 186.0 K/uL  CBC     Status: Abnormal   Collection Time    12/08/12  5:15 AM      Result Value Range   WBC 14.0 (*) 4.0 - 10.5 K/uL   RBC 4.16  3.87 - 5.11 MIL/uL   Hemoglobin 11.9 (*) 12.0 - 15.0 g/dL   HCT 16.1 (*) 09.6 - 04.5 %   MCV 84.1  78.0 - 100.0 fL   MCH 28.6  26.0 - 34.0 pg   MCHC 34.0  30.0 - 36.0 g/dL   RDW 40.9  81.1 - 91.4 %   Platelets 130 (*) 150 - 400 K/uL  BASIC METABOLIC PANEL     Status: Abnormal   Collection Time    12/08/12  5:15 AM      Result Value Range   Sodium 130 (*) 135 - 145 mEq/L   Potassium 3.6  3.5 - 5.1 mEq/L   Chloride 94 (*) 96 - 112 mEq/L   CO2 26  19 - 32 mEq/L   Glucose, Bld 116 (*) 70 - 99 mg/dL   BUN 12  6 - 23 mg/dL   Creatinine, Ser 7.82  0.50 - 1.10 mg/dL   Calcium 9.8  8.4 - 95.6 mg/dL   GFR calc non Af Amer 88 (*) >90 mL/min   GFR calc Af Amer  >90  >90 mL/min    Intake/Output Summary (Last 24 hours) at 12/08/12 0725 Last data filed at 12/08/12 0500  Gross per 24 hour  Intake 1836.25 ml  Output   2350 ml  Net -513.75 ml    ASSESSMENT AND PLAN:  Atrial fibrillation:  Rate OK on oral Cardizem.  On Xarelto.  Echo with normal EF.  We will continue with rate control and anticoagulation and discuss possible DCCV if she remains in afib in the next 3 to 4 weeks.    HYPERTENSION:  BP OK.  Continue current therapy.   CAP (community acquired pneumonia):  Plan per primary team.    Suprapubic pain:  Check UA   Rollene Rotunda 12/08/2012 7:25 AM

## 2012-12-09 ENCOUNTER — Inpatient Hospital Stay (HOSPITAL_COMMUNITY): Payer: Medicare Other

## 2012-12-09 DIAGNOSIS — R109 Unspecified abdominal pain: Secondary | ICD-10-CM | POA: Diagnosis not present

## 2012-12-09 DIAGNOSIS — J9600 Acute respiratory failure, unspecified whether with hypoxia or hypercapnia: Secondary | ICD-10-CM

## 2012-12-09 DIAGNOSIS — J189 Pneumonia, unspecified organism: Secondary | ICD-10-CM | POA: Diagnosis not present

## 2012-12-09 DIAGNOSIS — I4891 Unspecified atrial fibrillation: Secondary | ICD-10-CM | POA: Diagnosis not present

## 2012-12-09 DIAGNOSIS — R0989 Other specified symptoms and signs involving the circulatory and respiratory systems: Secondary | ICD-10-CM | POA: Diagnosis not present

## 2012-12-09 LAB — CBC
HCT: 35.8 % — ABNORMAL LOW (ref 36.0–46.0)
Hemoglobin: 12.3 g/dL (ref 12.0–15.0)
MCH: 29.1 pg (ref 26.0–34.0)
MCHC: 34.4 g/dL (ref 30.0–36.0)
MCV: 84.6 fL (ref 78.0–100.0)
Platelets: 157 10*3/uL (ref 150–400)
RDW: 14 % (ref 11.5–15.5)
WBC: 10.2 10*3/uL (ref 4.0–10.5)

## 2012-12-09 LAB — COMPREHENSIVE METABOLIC PANEL
ALT: 10 U/L (ref 0–35)
AST: 13 U/L (ref 0–37)
Albumin: 2.9 g/dL — ABNORMAL LOW (ref 3.5–5.2)
Alkaline Phosphatase: 48 U/L (ref 39–117)
Chloride: 99 mEq/L (ref 96–112)
Potassium: 3.3 mEq/L — ABNORMAL LOW (ref 3.5–5.1)
Sodium: 134 mEq/L — ABNORMAL LOW (ref 135–145)
Total Bilirubin: 0.4 mg/dL (ref 0.3–1.2)
Total Protein: 6.3 g/dL (ref 6.0–8.3)

## 2012-12-09 MED ORDER — METOPROLOL SUCCINATE ER 25 MG PO TB24
25.0000 mg | ORAL_TABLET | Freq: Every day | ORAL | Status: DC
  Administered 2012-12-09: 25 mg via ORAL
  Filled 2012-12-09 (×2): qty 1

## 2012-12-09 MED ORDER — DILTIAZEM HCL ER COATED BEADS 180 MG PO CP24
180.0000 mg | ORAL_CAPSULE | Freq: Every day | ORAL | Status: DC
  Administered 2012-12-09 – 2012-12-10 (×2): 180 mg via ORAL
  Filled 2012-12-09 (×2): qty 1

## 2012-12-09 MED ORDER — LEVOFLOXACIN 750 MG PO TABS
750.0000 mg | ORAL_TABLET | Freq: Every day | ORAL | Status: DC
  Administered 2012-12-09 – 2012-12-10 (×2): 750 mg via ORAL
  Filled 2012-12-09 (×3): qty 1

## 2012-12-09 MED ORDER — GUAIFENESIN ER 600 MG PO TB12
600.0000 mg | ORAL_TABLET | Freq: Two times a day (BID) | ORAL | Status: DC
  Administered 2012-12-09 – 2012-12-10 (×2): 600 mg via ORAL
  Filled 2012-12-09 (×3): qty 1

## 2012-12-09 MED ORDER — POTASSIUM CHLORIDE CRYS ER 20 MEQ PO TBCR
40.0000 meq | EXTENDED_RELEASE_TABLET | Freq: Once | ORAL | Status: AC
  Administered 2012-12-09: 40 meq via ORAL
  Filled 2012-12-09: qty 2

## 2012-12-09 NOTE — Progress Notes (Signed)
Physical Therapy Treatment Patient Details Name: Krystal Clarke MRN: [id number] DOB: Dec 04, 1935 Today's Date: 12/09/2012 Time: 4098-1191 PT Time Calculation (min): 23 min  PT Assessment / Plan / Recommendation  History of Present Illness 77 year old female patient with Sjogren's syndrome as well as seizure disorder hypertension. Presented because of issues related to progressive fatigue and cough and failure to thrive. Upon arrival to the ER the patient was found to be in atrial fibrillation with rapid ventricular response.   PT Comments   Pt presents with neuropathic pain limiting ease of walking and fatigue/weakness limiting ambulatory endurance.  HR stable this pm in 80-90's during walking.  Continues to require human assistance for mobility.    Pt will need assist at d/c if she is to go home.   Follow Up Recommendations  Home health PT;Supervision/Assistance - 24 hour     Does the patient have the potential to tolerate intense rehabilitation     Barriers to Discharge        Equipment Recommendations       Recommendations for Other Services    Frequency Min 3X/week   Progress towards PT Goals Progress towards PT goals: Progressing toward goals  Plan Current plan remains appropriate    Precautions / Restrictions Precautions Precautions: Fall Precaution Comments: legally blind, neuropathy in legs, cardiac problems, weakness   Pertinent Vitals/Pain unrated but moderate pain in legs; HR 80-90's during ambulation    Mobility  Bed Mobility Bed Mobility: Supine to Sit;Sit to Supine Supine to Sit: 5: Supervision;HOB flat;With rails Sit to Supine: 5: Supervision;HOB flat Details for Bed Mobility Assistance: increased time to perform, needs assist x1 to position trunk and expect could not rise from flat as easily Transfers Transfers: Sit to Stand;Stand to Sit Sit to Stand: 3: Mod assist;With upper extremity assist;From bed;From toilet Stand to Sit: 4: Min assist;With upper  extremity assist;To chair/3-in-1;To bed Details for Transfer Assistance: needs increased physical help to achieve lift off from lower surfaces and struggles due to pain in legs; cues to use stable surfaces for UE assist transitioning to/from RW Ambulation/Gait Ambulation/Gait Assistance: 4: Min assist Ambulation Distance (Feet): 100 Feet Assistive device: Rolling walker Ambulation/Gait Assistance Details: assist with navigating, cues to monitor breathing, heartrate, etc subjectively and assist with decision to return to room. Gait Pattern: Step-through pattern;Decreased stride length;Trunk flexed;Narrow base of support Gait velocity: 20 ft/32.5 sec = 0.63 (highly predictive of fall risk and dependency with ADLs) Stairs: No Wheelchair Mobility Wheelchair Mobility: No    Exercises     PT Diagnosis:    PT Problem List:   PT Treatment Interventions:     PT Goals (current goals can now be found in the care plan section)    Visit Information  Last PT Received On: 12/09/12 Assistance Needed: +1 History of Present Illness: 77 year old female patient with Sjogren's syndrome as well as seizure disorder hypertension. Presented because of issues related to progressive fatigue and cough and failure to thrive. Upon arrival to the ER the patient was found to be in atrial fibrillation with rapid ventricular response.    Subjective Data  Subjective: my legs hurt from neuropathy and I don't want to move wrong   Cognition  Cognition Arousal/Alertness: Awake/alert Behavior During Therapy: WFL for tasks assessed/performed Overall Cognitive Status: Within Functional Limits for tasks assessed    Balance     End of Session PT - End of Session Equipment Utilized During Treatment: Gait belt Activity Tolerance: Patient tolerated treatment well Patient left: in bed;with call  bell/phone within reach Nurse Communication: Mobility status   GP     Dennis Bast 12/09/2012, 3:13 PM

## 2012-12-09 NOTE — Progress Notes (Signed)
Pt's HR >150's when ambulating. Pt HR back down to 110's when at rest. Will continue to monitor.

## 2012-12-09 NOTE — Progress Notes (Signed)
[facility] PROGRESS NOTE  Krystal Clarke WGN:[id number] DOB: 1935/08/31 DOA: 12/06/2012 PCP: Cala Bradford, MD  Assessment/Plan: 1. Age of fibrillation. Her Cardizem was increased 280 mg by mouth daily, as metoprolol 25 mg by mouth daily was added by cardiology. She is on anticoagulation with a xarelto.  2. Hypokalemia. Patient receiving 40 mEq of potassium chloride this morning 3. Community acquire pneumonia. Repeat chest x-ray, showing no acute disease. Will discontinue IV antimicrobial therapy, start oral Levaquin 4. Seizure disorder. Continue Depakote 5. Hypertension. Patient on Cardizem and metoprolol  Code Status: Full Code Family Communication: Plan discussed with patient Disposition Plan: Anticipate discharge in the next 24 hours   Consultants:  Cardiology  Procedures:  Transthoracic echocardiogram performed on 12/07/2012 impression: Ejection fraction estimated at 65-70%, intermediate diastolic function in the setting of major fibrillation. No wall motion abnormalities.  Antibiotics:  Azithromycin  Ceftriaxone  HPI/Subjective: Patient reports feeling better today though not quite at her baseline yet. She ambulated with physical therapy, he is tolerating by mouth intake. Looking for to be discharged soon.  Objective: Filed Vitals:   12/09/12 1325  BP: 127/83  Pulse: 88  Temp: 98 F (36.7 C)  Resp: 18    Intake/Output Summary (Last 24 hours) at 12/09/12 1519 Last data filed at 12/09/12 1118  Gross per 24 hour  Intake      0 ml  Output   1852 ml  Net  -1852 ml   Filed Weights   12/06/12 2257  Weight: 71.5 kg (157 lb 10.1 oz)    Exam:   General:  She is in no acute distress awake alert oriented  Cardiovascular: Irregular rate and rhythm normal S1-S2 no murmurs rubs gallops  Respiratory: Clear to auscultation bilaterally no wheezing rhonchi or rales  Abdomen: Patient having mild pain to suprapubic, lower abdominal region no peritoneal  signs  Musculoskeletal: Preserved range of motion function  Extremity: No edema   Data Reviewed: Basic Metabolic Panel:  Recent Labs Lab 12/06/12 1713 12/07/12 0635 12/08/12 0515 12/09/12 0623  NA 132* 128* 130* 134*  K 3.3* 3.7 3.6 3.3*  CL 94* 95* 94* 99  CO2 27 22 26 26   GLUCOSE 108* 99 116* 115*  BUN 17 14 12 9   CREATININE 0.55 0.53 0.57 0.54  CALCIUM 10.6* 9.9 9.8 9.9   Liver Function Tests:  Recent Labs Lab 12/07/12 0635 12/09/12 0623  AST 20 13  ALT 10 10  ALKPHOS 62 48  BILITOT 0.4 0.4  PROT 6.2 6.3  ALBUMIN 3.3* 2.9*   No results found for this basename: LIPASE, AMYLASE,  in the last 168 hours No results found for this basename: AMMONIA,  in the last 168 hours CBC:  Recent Labs Lab 12/06/12 1713 12/07/12 0635 12/08/12 0515 12/09/12 0623  WBC 12.4* 12.0* 14.0* 10.2  NEUTROABS 10.0*  --   --   --   HGB 14.0 12.5 11.9* 12.3  HCT 39.8 36.4 35.0* 35.8*  MCV 83.1 84.7 84.1 84.6  PLT 137* 143* 130* 157   Cardiac Enzymes:  Recent Labs Lab 12/06/12 1713 12/06/12 2340 12/07/12 0635 12/07/12 1226  TROPONINI <0.30 <0.30 <0.30 <0.30   BNP (last 3 results) No results found for this basename: PROBNP,  in the last 8760 hours CBG: No results found for this basename: GLUCAP,  in the last 168 hours  Recent Results (from the past 240 hour(s))  MRSA PCR SCREENING     Status: None   Collection Time    12/06/12 11:03 PM  Result Value Range Status   MRSA by PCR NEGATIVE  NEGATIVE Final   Comment:            The GeneXpert MRSA Assay (FDA     approved for NASAL specimens     only), is one component of a     comprehensive MRSA colonization     surveillance program. It is not     intended to diagnose MRSA     infection nor to guide or     monitor treatment for     MRSA infections.  CULTURE, BLOOD (ROUTINE X 2)     Status: None   Collection Time    12/06/12 11:30 PM      Result Value Range Status   Specimen Description BLOOD RIGHT ARM   Final    Special Requests BOTTLES DRAWN AEROBIC AND ANAEROBIC 10CC EA   Final   Culture  Setup Time     Final   Value: 12/07/2012 04:30     Performed at [facility]   Culture     Final   Value:        BLOOD CULTURE RECEIVED NO GROWTH TO DATE CULTURE WILL BE HELD FOR 5 DAYS BEFORE ISSUING A FINAL NEGATIVE REPORT     Performed at [facility]   Report Status PENDING   Incomplete  CULTURE, BLOOD (ROUTINE X 2)     Status: None   Collection Time    12/06/12 11:40 PM      Result Value Range Status   Specimen Description BLOOD RIGHT HAND   Final   Special Requests BOTTLES DRAWN AEROBIC ONLY 6CC   Final   Culture  Setup Time     Final   Value: 12/07/2012 04:30     Performed at [facility]   Culture     Final   Value:        BLOOD CULTURE RECEIVED NO GROWTH TO DATE CULTURE WILL BE HELD FOR 5 DAYS BEFORE ISSUING A FINAL NEGATIVE REPORT     Performed at [facility]   Report Status PENDING   Incomplete     Studies: Dg Chest Port 1 View  12/09/2012   CLINICAL DATA:  Congestion. Cough.  Acute respiratory failure.  EXAM: PORTABLE CHEST - 1 VIEW  COMPARISON:  PA and lateral chest 12/06/2012.  FINDINGS: Heart size and mediastinal contours are within normal limits. Both lungs are clear. Visualized skeletal structures are unremarkable.  IMPRESSION: No acute disease.   Electronically Signed   By: Drusilla Kanner M.D.   On: 12/09/2012 06:12   Dg Abd Portable 1v  12/08/2012   CLINICAL DATA:  Abdominal pain.  EXAM: PORTABLE ABDOMEN - 1 VIEW  COMPARISON:  CT, 05/28/2011.  FINDINGS: Normal bowel gas pattern. No evidence of a renal or ureteral calculus. The soft tissues are unremarkable.  Degenerative changes are noted of the mid and lower lumbar spine.  IMPRESSION: No acute findings. No evidence of bowel obstruction.   Electronically Signed   By: Amie Portland   On: 12/08/2012 13:04    Scheduled Meds: . antiseptic oral rinse  15 mL Mouth Rinse q12n4p  . azithromycin  500 mg Oral  QHS  . cefTRIAXone (ROCEPHIN)  IV  1 g Intravenous Q24H  . chlorhexidine  15 mL Mouth Rinse BID  . diltiazem  180 mg Oral Daily  . divalproex  250 mg Oral BID  . dorzolamide-timolol  1 drop Both Eyes BID  . fenofibrate  160 mg Oral Daily  . metoprolol succinate  25 mg Oral Daily  . pilocarpine  5 mg Oral TID  . pregabalin  75 mg Oral BID  . rivaroxaban  20 mg Oral Q supper  . sodium chloride  3 mL Intravenous Q12H  . sucralfate  1 g Oral QID  . Vitamin D (Ergocalciferol)  50,000 Units Oral Q7 days   Continuous Infusions:   Active Problems:   ANEMIA, B12 DEFICIENCY   MIGRAINE HEADACHE   HYPERTENSION   Peripheral neuropathy   Sjogren's syndrome   Atrial fibrillation with RVR   Acute respiratory failure with hypoxia   Dehydration with hyponatremia   Seizure disorder    Time spent: 35 minutes    Krystal Clarke  [facility] Pager [phone]. If 7PM-7AM, please contact night-coverage at [email or web address], password [facility] 12/09/2012, 3:19 PM  LOS: 3 days

## 2012-12-09 NOTE — Progress Notes (Signed)
SUBJECTIVE:  No acute SOB.  Heart rate went up with ambulation. She wants to go home.    PHYSICAL EXAM Filed Vitals:   12/08/12 1605 12/08/12 1800 12/08/12 2026 12/09/12 0412  BP: 153/81 156/76 152/74 132/76  Pulse: 90  99 102  Temp: 99.6 F (37.6 C)  98.2 F (36.8 C) 98.4 F (36.9 C)  TempSrc: Oral  Oral Oral  Resp: 20  18 18   Height:      Weight:      SpO2: 98%  97% 98%   General:  No acute distress Lungs:  Clear Heart:  Irregular Abdomen:  Positive bowel sounds, no rebound no guarding, tenderness to palpation lower abdomen.  Extremities:  No edema   LABS:  Results for orders placed during the hospital encounter of 12/06/12 (from the past 24 hour(s))  URINALYSIS, ROUTINE W REFLEX MICROSCOPIC     Status: Abnormal   Collection Time    12/08/12  1:50 PM      Result Value Range   Color, Urine YELLOW  YELLOW   APPearance CLEAR  CLEAR   Specific Gravity, Urine 1.006  1.005 - 1.030   pH 7.5  5.0 - 8.0   Glucose, UA NEGATIVE  NEGATIVE mg/dL   Hgb urine dipstick TRACE (*) NEGATIVE   Bilirubin Urine NEGATIVE  NEGATIVE   Ketones, ur NEGATIVE  NEGATIVE mg/dL   Protein, ur 161 (*) NEGATIVE mg/dL   Urobilinogen, UA 0.2  0.0 - 1.0 mg/dL   Nitrite NEGATIVE  NEGATIVE   Leukocytes, UA NEGATIVE  NEGATIVE  URINE MICROSCOPIC-ADD ON     Status: None   Collection Time    12/08/12  1:50 PM      Result Value Range   Squamous Epithelial / LPF RARE  RARE   WBC, UA 0-2  <3 WBC/hpf  COMPREHENSIVE METABOLIC PANEL     Status: Abnormal   Collection Time    12/09/12  6:23 AM      Result Value Range   Sodium 134 (*) 135 - 145 mEq/L   Potassium 3.3 (*) 3.5 - 5.1 mEq/L   Chloride 99  96 - 112 mEq/L   CO2 26  19 - 32 mEq/L   Glucose, Bld 115 (*) 70 - 99 mg/dL   BUN 9  6 - 23 mg/dL   Creatinine, Ser 0.96  0.50 - 1.10 mg/dL   Calcium 9.9  8.4 - 04.5 mg/dL   Total Protein 6.3  6.0 - 8.3 g/dL   Albumin 2.9 (*) 3.5 - 5.2 g/dL   AST 13  0 - 37 U/L   ALT 10  0 - 35 U/L   Alkaline  Phosphatase 48  39 - 117 U/L   Total Bilirubin 0.4  0.3 - 1.2 mg/dL   GFR calc non Af Amer >90  >90 mL/min   GFR calc Af Amer >90  >90 mL/min  CBC     Status: Abnormal   Collection Time    12/09/12  6:23 AM      Result Value Range   WBC 10.2  4.0 - 10.5 K/uL   RBC 4.23  3.87 - 5.11 MIL/uL   Hemoglobin 12.3  12.0 - 15.0 g/dL   HCT 40.9 (*) 81.1 - 91.4 %   MCV 84.6  78.0 - 100.0 fL   MCH 29.1  26.0 - 34.0 pg   MCHC 34.4  30.0 - 36.0 g/dL   RDW 78.2  95.6 - 21.3 %   Platelets 157  150 -  400 K/uL    Intake/Output Summary (Last 24 hours) at 12/09/12 0827 Last data filed at 12/09/12 0419  Gross per 24 hour  Intake    303 ml  Output   1251 ml  Net   -948 ml    ASSESSMENT AND PLAN:  Atrial fibrillation:  On Xarelto.  Echo with normal EF.  We will continue with rate control and anticoagulation and discuss possible DCCV if she remains in afib in the next 3 to 4 weeks.  I will Increase the Cardizem to 180 XL daily.  I will restart low dose metoprolol.   HYPERTENSION:  BP OK.  Continue current therapy.   CAP (community acquired pneumonia):  Plan per primary team.    Hyopkalemia:  Potassium supplemented.    Fayrene Fearing [facility] 12/09/2012 8:27 AM

## 2012-12-10 DIAGNOSIS — E860 Dehydration: Secondary | ICD-10-CM

## 2012-12-10 DIAGNOSIS — J189 Pneumonia, unspecified organism: Secondary | ICD-10-CM | POA: Diagnosis not present

## 2012-12-10 DIAGNOSIS — I4891 Unspecified atrial fibrillation: Secondary | ICD-10-CM | POA: Diagnosis not present

## 2012-12-10 DIAGNOSIS — R269 Unspecified abnormalities of gait and mobility: Secondary | ICD-10-CM

## 2012-12-10 DIAGNOSIS — J9600 Acute respiratory failure, unspecified whether with hypoxia or hypercapnia: Secondary | ICD-10-CM | POA: Diagnosis not present

## 2012-12-10 MED ORDER — RIVAROXABAN 20 MG PO TABS: 20.0000 mg | ORAL_TABLET | Freq: Every day | ORAL | Status: DC

## 2012-12-10 MED ORDER — DILTIAZEM HCL ER COATED BEADS 180 MG PO CP24: 180.0000 mg | ORAL_CAPSULE | Freq: Every day | ORAL | Status: DC

## 2012-12-10 MED ORDER — LEVOFLOXACIN 750 MG PO TABS: 750.0000 mg | ORAL_TABLET | Freq: Every day | ORAL | Status: DC

## 2012-12-10 NOTE — Progress Notes (Signed)
Discharge instructions given to pt along with new prescriptions. Pt and husband verbalized understanding of discharge instructions. Pt is stable for discharge with husband. Will provide wheelchair to take pt to car.

## 2012-12-10 NOTE — Discharge Summary (Signed)
Physician Discharge Summary  Krystal Clarke:[id number] DOB: 31-Aug-1935 DOA: 12/06/2012  PCP: Krystal Bradford, MD  Admit date: 12/06/2012 Discharge date: 12/10/2012  Time spent:  Recommendations for Outpatient Follow-up:  1. Followup on heart rates, patient admitted for A. fib with RVR.  Discharge Diagnoses:  Active Problems:   ANEMIA, B12 DEFICIENCY   MIGRAINE HEADACHE   HYPERTENSION   Peripheral neuropathy   Sjogren's syndrome   Atrial fibrillation with RVR   Acute respiratory failure with hypoxia   Dehydration with hyponatremia   Seizure disorder   Discharge Condition: Stable improved  Diet recommendation: Heart healthy diet  Filed Weights   12/06/12 2257  Weight: 71.5 kg (157 lb 10.1 oz)    History of present illness:  77 yr old WF w/ pmhx significant for Sjogren's syndrome, seizure disorder, HTN, peripheral neuropathy, presents due to fatigue and cough. She presents with her husband and daughter. They state that over the past 6 months her mental status has worsened. She is weaker and needs more assistance. She has recently has cough and sputum, which they attributed to congestion and a URI. Her husband had similar symptoms. She admitted to some lightheadedness as well. She was found to be in atrial fibrillation in the ED.  Cardiology evaluated the patient an recommended starting xarelto. Diltiazem was started in the ED and this will be continued. Her rate is currently in 90's to low 100's.  She is drowsy, but denies any SOB or CP. Her husband states she's had chills. CXR without any obvious infiltrate. She does have a leukocytosis of 12k.   Hospital Course:  Patient is a pleasant 77 year old female with a history of hypertension, seizure disorder, admitted to the medicine service on 12/06/2012, presenting with an overall functional decline, cough, generalized weakness and malaise. Her husband had been diagnosed with an upper respiratory tract infection. In the  emergency room she was found to be a major fibrillation with rapid ventricular response. She was started on IV diltiazem in the emergency room, as cardiology was consulted. She was started on anticoagulation with Xarelto given risk for thromboembolic events. Initial chest x-ray did not show evidence of acute infiltrate. She was started on empiric antibiotic therapy with IV ceftriaxone and IV azithromycin. Given improvements to ventricular rates, cardiology transitioned her to oral Cardizem and oral metoprolol. Metoprolol was later discontinued due to episode of bradycardia by cardiology .Meanwhile she was continue treatment for presumed community-acquired pneumonia. A transthoracic echocardiogram revealed an ejection fraction of 65-70% with indeterminate diastolic function given the age of fibrillation. There were no wall motion abnormalities noted. Physical therapy was consulted. On 12/10/2012 she hadmarkedly improvement, ambulated down the hallway with me without developing significant shortness of breath. She had been tolerating by mouth intake, remained afebrile, with repeat chest x-ray not showing infiltrates. She was discharged in stable condition on a 25 2014.  Procedures:  Transthoracic echocardiogram performed on 12/07/2012 impression: Ejection fraction 65-70%, indeterminate diastolic function in the setting of atrial fibrillation. No wall motion abnormalities.  Consultations:  Cardiology  Discharge Exam: Filed Vitals:   12/10/12 0433  BP: 131/80  Pulse: 96  Temp: 98.3 F (36.8 C)  Resp: 16    General: Patient is in no acute distress, she reports feeling better, looking forward to going home today. Cardiovascular: Irregular rate and rhythm normal S1-S2 no murmurs rubs or gallops Respiratory: Normal respiratory effort, lungs clear to auscultation bilaterally no wheezing rhonchi or rales Abdomen: Soft nontender nondistended Extremities: No cyanosis clubbing or edema  Discharge  Instructions  Discharge Orders   Future Appointments Provider Department Dept Phone   12/14/2012 11:30 AM Huston Foley, MD [facility] [phone]   Future Orders Complete By Expires   Call MD for:  difficulty breathing, headache or visual disturbances  As directed    Call MD for:  extreme fatigue  As directed    Call MD for:  persistant dizziness or light-headedness  As directed    Call MD for:  persistant nausea and vomiting  As directed    Diet - low sodium heart healthy  As directed    Discharge instructions  As directed    Comments:     Keep all followup appointments Call your Doctor immediately should symptoms worsen   Face-to-face encounter (required for Medicare/Medicaid patients)  As directed    Comments:     I Ramere Downs certify that this patient is under my care and that I, or a nurse practitioner or physician's assistant working with me, had a face-to-face encounter that meets the physician face-to-face encounter requirements with this patient on 12/10/2012. The encounter with the patient was in whole, or in part for the following medical condition(s) which is the primary reason for home health care (List medical condition): Patient legally blind, deconditoned, high fall risk, would benefit from home physical therapy.   Questions:     The encounter with the patient was in whole, or in part, for the following medical condition, which is the primary reason for home health care:  Deconditioning, home PT   I certify that, based on my findings, the following services are medically necessary home health services:  Physical therapy   My clinical findings support the need for the above services:  Unable to leave home safely without assistance and/or assistive device   Further, I certify that my clinical findings support that this patient is homebound due to:  Unsafe ambulation due to balance issues   Reason for Medically Necessary Home Health Services:  Therapy- Firefighter, Patent examiner   Home Health  As directed    Questions:     To provide the following care/treatments:  PT   Increase activity slowly  As directed        Medication List    STOP taking these medications       metoprolol succinate 25 MG 24 hr tablet  Commonly known as:  TOPROL-XL      TAKE these medications       diltiazem 180 MG 24 hr capsule  Commonly known as:  CARDIZEM CD  Take 1 capsule (180 mg total) by mouth daily.     divalproex 250 MG 24 hr tablet  Commonly known as:  DEPAKOTE ER  Take 250 mg by mouth 2 (two) times daily.     dorzolamide-timolol 22.3-6.8 MG/ML ophthalmic solution  Commonly known as:  COSOPT  Place 1 drop into both eyes 2 (two) times daily.     fenofibrate 145 MG tablet  Commonly known as:  TRICOR  Take 145 mg by mouth daily.     levofloxacin 750 MG tablet  Commonly known as:  LEVAQUIN  Take 1 tablet (750 mg total) by mouth daily after breakfast.     LORazepam 0.5 MG tablet  Commonly known as:  ATIVAN  Take 0.5 mg by mouth every 8 (eight) hours as needed for anxiety.     meloxicam 15 MG tablet  Commonly known as:  MOBIC  Take 15 mg by mouth  daily.     pilocarpine 5 MG tablet  Commonly known as:  SALAGEN  Take 5 mg by mouth 3 (three) times daily.     pregabalin 75 MG capsule  Commonly known as:  LYRICA  Take 75 mg by mouth 2 (two) times daily.     Rivaroxaban 20 MG Tabs tablet  Commonly known as:  XARELTO  Take 1 tablet (20 mg total) by mouth daily with supper.     rizatriptan 10 MG tablet  Commonly known as:  MAXALT  Take 1 pill daily as needed for migraine. Try not to exceed 10 pills per month. May repeat in 2 hours if needed     sucralfate 1 G tablet  Commonly known as:  CARAFATE  Take 1 g by mouth 4 (four) times daily.     Vitamin D (Ergocalciferol) 50000 UNITS Caps capsule  Commonly known as:  DRISDOL  Take 50,000 Units by mouth every 7 (seven) days. Does not remember what day of the week  she takes       Allergies  Allergen Reactions  . Codeine Nausea Only  . Hydrocodone Nausea And Vomiting  . Morphine And Related Nausea Only  . Oxycodone-Aspirin     REACTION: DEATHLY SICK-VOMITTING       Follow-up Information   Follow up with Krystal Bradford, MD In 1 week.   Specialty:  Family Medicine   Contact information:   [address] [place] [address] [phone]       Follow up In 2 weeks.   Contact information:   [address] [place] [address] [phone]        The results of significant diagnostics from this hospitalization (including imaging, microbiology, ancillary and laboratory) are listed below for reference.    Significant Diagnostic Studies: Dg Chest 2 View  12/06/2012   CLINICAL DATA:  Atrial fibrillation  EXAM: CHEST  2 VIEW  COMPARISON:  November 07, 2010  FINDINGS: Lungs are clear. Heart is upper normal in size with normal pulmonary vascularity. No adenopathy. Aorta is tortuous. There is mild degenerative change in the thoracic spine.  IMPRESSION: No edema or consolidation.   Electronically Signed   By: Bretta Bang   On: 12/06/2012 18:04   Dg Chest Port 1 View  12/09/2012   CLINICAL DATA:  Congestion. Cough.  Acute respiratory failure.  EXAM: PORTABLE CHEST - 1 VIEW  COMPARISON:  PA and lateral chest 12/06/2012.  FINDINGS: Heart size and mediastinal contours are within normal limits. Both lungs are clear. Visualized skeletal structures are unremarkable.  IMPRESSION: No acute disease.   Electronically Signed   By: Drusilla Kanner M.D.   On: 12/09/2012 06:12   Dg Abd Portable 1v  12/08/2012   CLINICAL DATA:  Abdominal pain.  EXAM: PORTABLE ABDOMEN - 1 VIEW  COMPARISON:  CT, 05/28/2011.  FINDINGS: Normal bowel gas pattern. No evidence of a renal or ureteral calculus. The soft tissues are unremarkable.  Degenerative changes are noted of the mid and lower lumbar spine.  IMPRESSION: No acute findings. No evidence of bowel  obstruction.   Electronically Signed   By: Amie Portland   On: 12/08/2012 13:04   Dg Foot Complete Left  11/25/2012   *RADIOLOGY REPORT*  Clinical Data: Fall.  Pain in the left first, second, and third toes.  LEFT FOOT - COMPLETE 3+ VIEW  Comparison: None.  Findings: Three-view study shows no evidence for fracture.  No subluxation or dislocation.  There is  hallux valgus deformity with some degenerative changes in the MTP joint of the great toe.  IMPRESSION: No acute bony findings.   Original Report Authenticated By: Kennith Center, M.D.    Microbiology: Recent Results (from the past 240 hour(s))  MRSA PCR SCREENING     Status: None   Collection Time    12/06/12 11:03 PM      Result Value Range Status   MRSA by PCR NEGATIVE  NEGATIVE Final   Comment:            The GeneXpert MRSA Assay (FDA     approved for NASAL specimens     only), is one component of a     comprehensive MRSA colonization     surveillance program. It is not     intended to diagnose MRSA     infection nor to guide or     monitor treatment for     MRSA infections.  CULTURE, BLOOD (ROUTINE X 2)     Status: None   Collection Time    12/06/12 11:30 PM      Result Value Range Status   Specimen Description BLOOD RIGHT ARM   Final   Special Requests BOTTLES DRAWN AEROBIC AND ANAEROBIC 10CC EA   Final   Culture  Setup Time     Final   Value: 12/07/2012 04:30     Performed at [facility]   Culture     Final   Value:        BLOOD CULTURE RECEIVED NO GROWTH TO DATE CULTURE WILL BE HELD FOR 5 DAYS BEFORE ISSUING A FINAL NEGATIVE REPORT     Performed at [facility]   Report Status PENDING   Incomplete  CULTURE, BLOOD (ROUTINE X 2)     Status: None   Collection Time    12/06/12 11:40 PM      Result Value Range Status   Specimen Description BLOOD RIGHT HAND   Final   Special Requests BOTTLES DRAWN AEROBIC ONLY 6CC   Final   Culture  Setup Time     Final   Value: 12/07/2012 04:30     Performed at [facility]   Culture     Final   Value:        BLOOD CULTURE RECEIVED NO GROWTH TO DATE CULTURE WILL BE HELD FOR 5 DAYS BEFORE ISSUING A FINAL NEGATIVE REPORT     Performed at [facility]   Report Status PENDING   Incomplete     Labs: Basic Metabolic Panel:  Recent Labs Lab 12/06/12 1713 12/07/12 0635 12/08/12 0515 12/09/12 0623  NA 132* 128* 130* 134*  K 3.3* 3.7 3.6 3.3*  CL 94* 95* 94* 99  CO2 27 22 26 26   GLUCOSE 108* 99 116* 115*  BUN 17 14 12 9   CREATININE 0.55 0.53 0.57 0.54  CALCIUM 10.6* 9.9 9.8 9.9   Liver Function Tests:  Recent Labs Lab 12/07/12 0635 12/09/12 0623  AST 20 13  ALT 10 10  ALKPHOS 62 48  BILITOT 0.4 0.4  PROT 6.2 6.3  ALBUMIN 3.3* 2.9*   No results found for this basename: LIPASE, AMYLASE,  in the last 168 hours No results found for this basename: AMMONIA,  in the last 168 hours CBC:  Recent Labs Lab 12/06/12 1713 12/07/12 0635 12/08/12 0515 12/09/12 0623  WBC 12.4* 12.0* 14.0* 10.2  NEUTROABS 10.0*  --   --   --   HGB 14.0 12.5  11.9* 12.3  HCT 39.8 36.4 35.0* 35.8*  MCV 83.1 84.7 84.1 84.6  PLT 137* 143* 130* 157   Cardiac Enzymes:  Recent Labs Lab 12/06/12 1713 12/06/12 2340 12/07/12 0635 12/07/12 1226  TROPONINI <0.30 <0.30 <0.30 <0.30   BNP: BNP (last 3 results) No results found for this basename: PROBNP,  in the last 8760 hours CBG: No results found for this basename: GLUCAP,  in the last 168 hours     Signed:  Jeralyn Bennett  [facility] 12/10/2012, 9:04 AM

## 2012-12-10 NOTE — Progress Notes (Signed)
   SUBJECTIVE:  No acute SOB.  Heart rate was OK with ambulation and actually running somewhat low.   PHYSICAL EXAM Filed Vitals:   12/09/12 0918 12/09/12 1325 12/09/12 1933 12/10/12 0433  BP: 141/77 127/83 145/85 131/80  Pulse: 94 88 85 96  Temp: 98.5 F (36.9 C) 98 F (36.7 C) 98.7 F (37.1 C) 98.3 F (36.8 C)  TempSrc: Oral Oral Oral Oral  Resp: 18 18 18 16   Height:      Weight:      SpO2: 95% 97% 95% 97%   General:  No acute distress Lungs:  Clear Heart:  Irregular Abdomen:  Positive bowel sounds, no rebound no guarding, tenderness to palpation lower abdomen.  Extremities:  No edema   LABS:  No results found for this or any previous visit (from the past 24 hour(s)).  Intake/Output Summary (Last 24 hours) at 12/10/12 0701 Last data filed at 12/09/12 1118  Gross per 24 hour  Intake      0 ml  Output    601 ml  Net   -601 ml    ASSESSMENT AND PLAN:  Atrial fibrillation:  On Xarelto.  Echo with normal EF.  We will continue with rate control and anticoagulation and discuss possible DCCV if she remains in afib in the next 3 to 4 weeks.  I increased the cardizem and restarted the beta blocker.  Her rate is actually somewhat low.  I will stop the metoprolol.  I will have her follow up with Dr. Jens Som in two weeks.    HYPERTENSION:  BP OK.  Meds as above.   CAP (community acquired pneumonia):  Plan per primary team.  Probably home today.      Fayrene Fearing Pasqualino Witherspoon 12/10/2012 7:01 AM

## 2012-12-13 LAB — CULTURE, BLOOD (ROUTINE X 2): Culture: NO GROWTH

## 2012-12-14 ENCOUNTER — Ambulatory Visit (INDEPENDENT_AMBULATORY_CARE_PROVIDER_SITE_OTHER): Payer: Medicare Other | Admitting: Neurology

## 2012-12-14 ENCOUNTER — Encounter: Payer: Self-pay | Admitting: Neurology

## 2012-12-14 VITALS — BP 116/71 | HR 78 | Temp 98.2°F | Ht 63.0 in | Wt 154.0 lb

## 2012-12-14 DIAGNOSIS — M3500 Sicca syndrome, unspecified: Secondary | ICD-10-CM | POA: Diagnosis not present

## 2012-12-14 DIAGNOSIS — R413 Other amnesia: Secondary | ICD-10-CM

## 2012-12-14 DIAGNOSIS — R269 Unspecified abnormalities of gait and mobility: Secondary | ICD-10-CM

## 2012-12-14 DIAGNOSIS — G43909 Migraine, unspecified, not intractable, without status migrainosus: Secondary | ICD-10-CM

## 2012-12-14 DIAGNOSIS — G609 Hereditary and idiopathic neuropathy, unspecified: Secondary | ICD-10-CM
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
## 2012-12-14 NOTE — Patient Instructions (Signed)
  I do have some generic suggestions for you today:  Please make sure that you drink plenty of fluids. I would like for you to exercise daily for example in the form of walking 20-30 minutes every day, if you can. Please keep a regular sleep-wake schedule, keep regular meal times, do not skip any meals, eat  healthy snacks in between meals, such as fruit or nuts. Try to eat protein with every meal.   As far as your medications are concerned, I would like to suggest: no changes.    As far as diagnostic testing, I recommend: no new test.    I do not think we need to make any changes in your medications at this point. I think you're stable enough that I can see you back in 6 months, sooner if we need to. Please call us if you have any interim questions, concerns, or problems or updates to need to discuss.  Brett Canales is my clinical assistant and will answer any of your questions and relay your messages to me and will give you my messages.   Our phone number is [phone]. We also have an after hours call service for urgent matters and there is a physician on-call for urgent questions. For any emergencies you know to call 911 or go to the nearest emergency room.  If you have sudden onset of one sided weakness, numbness, tingling, slurring of speech or droopy face, call 911!

## 2012-12-15 DIAGNOSIS — I10 Essential (primary) hypertension: Secondary | ICD-10-CM | POA: Diagnosis not present

## 2012-12-15 DIAGNOSIS — G609 Hereditary and idiopathic neuropathy, unspecified: Secondary | ICD-10-CM | POA: Diagnosis not present

## 2012-12-15 DIAGNOSIS — M1990 Unspecified osteoarthritis, unspecified site: Secondary | ICD-10-CM | POA: Diagnosis not present

## 2012-12-15 DIAGNOSIS — M3500 Sicca syndrome, unspecified: Secondary | ICD-10-CM | POA: Diagnosis not present

## 2012-12-15 DIAGNOSIS — G40909 Epilepsy, unspecified, not intractable, without status epilepticus: Secondary | ICD-10-CM | POA: Diagnosis not present

## 2012-12-15 DIAGNOSIS — W19XXXA Unspecified fall, initial encounter: Secondary | ICD-10-CM | POA: Diagnosis not present

## 2012-12-15 DIAGNOSIS — H548 Legal blindness, as defined in USA: Secondary | ICD-10-CM | POA: Diagnosis not present

## 2012-12-15 DIAGNOSIS — IMO0001 Reserved for inherently not codable concepts without codable children: Secondary | ICD-10-CM | POA: Diagnosis not present

## 2012-12-15 DIAGNOSIS — D518 Other vitamin B12 deficiency anemias: Secondary | ICD-10-CM | POA: Diagnosis not present

## 2012-12-15 DIAGNOSIS — Z7901 Long term (current) use of anticoagulants: Secondary | ICD-10-CM | POA: Diagnosis not present

## 2012-12-15 DIAGNOSIS — I4891 Unspecified atrial fibrillation: Secondary | ICD-10-CM | POA: Diagnosis not present

## 2012-12-17 DIAGNOSIS — R209 Unspecified disturbances of skin sensation: Secondary | ICD-10-CM | POA: Diagnosis not present

## 2012-12-17 DIAGNOSIS — J189 Pneumonia, unspecified organism: Secondary | ICD-10-CM | POA: Diagnosis not present

## 2012-12-17 DIAGNOSIS — D72829 Elevated white blood cell count, unspecified: Secondary | ICD-10-CM | POA: Diagnosis not present

## 2012-12-17 DIAGNOSIS — I4891 Unspecified atrial fibrillation: Secondary | ICD-10-CM | POA: Diagnosis not present

## 2012-12-17 DIAGNOSIS — E559 Vitamin D deficiency, unspecified: Secondary | ICD-10-CM | POA: Diagnosis not present

## 2012-12-17 DIAGNOSIS — E876 Hypokalemia: Secondary | ICD-10-CM | POA: Diagnosis not present

## 2012-12-18 DIAGNOSIS — D518 Other vitamin B12 deficiency anemias: Secondary | ICD-10-CM | POA: Diagnosis not present

## 2012-12-18 DIAGNOSIS — G609 Hereditary and idiopathic neuropathy, unspecified: Secondary | ICD-10-CM | POA: Diagnosis not present

## 2012-12-18 DIAGNOSIS — I4891 Unspecified atrial fibrillation: Secondary | ICD-10-CM | POA: Diagnosis not present

## 2012-12-18 DIAGNOSIS — IMO0001 Reserved for inherently not codable concepts without codable children: Secondary | ICD-10-CM | POA: Diagnosis not present

## 2012-12-18 DIAGNOSIS — M1990 Unspecified osteoarthritis, unspecified site: Secondary | ICD-10-CM | POA: Diagnosis not present

## 2012-12-18 DIAGNOSIS — I10 Essential (primary) hypertension: Secondary | ICD-10-CM | POA: Diagnosis not present

## 2012-12-21 ENCOUNTER — Other Ambulatory Visit: Payer: Self-pay | Admitting: Family Medicine

## 2012-12-21 DIAGNOSIS — I4891 Unspecified atrial fibrillation: Secondary | ICD-10-CM | POA: Diagnosis not present

## 2012-12-21 DIAGNOSIS — IMO0002 Reserved for concepts with insufficient information to code with codable children: Secondary | ICD-10-CM | POA: Diagnosis not present

## 2012-12-21 DIAGNOSIS — G609 Hereditary and idiopathic neuropathy, unspecified: Secondary | ICD-10-CM | POA: Diagnosis not present

## 2012-12-21 DIAGNOSIS — M1990 Unspecified osteoarthritis, unspecified site: Secondary | ICD-10-CM | POA: Diagnosis not present

## 2012-12-21 DIAGNOSIS — M1710 Unilateral primary osteoarthritis, unspecified knee: Secondary | ICD-10-CM | POA: Diagnosis not present

## 2012-12-21 DIAGNOSIS — D518 Other vitamin B12 deficiency anemias: Secondary | ICD-10-CM | POA: Diagnosis not present

## 2012-12-21 DIAGNOSIS — R200 Anesthesia of skin: Secondary | ICD-10-CM

## 2012-12-21 DIAGNOSIS — M25569 Pain in unspecified knee: Secondary | ICD-10-CM | POA: Diagnosis not present

## 2012-12-21 DIAGNOSIS — IMO0001 Reserved for inherently not codable concepts without codable children: Secondary | ICD-10-CM | POA: Diagnosis not present

## 2012-12-21 DIAGNOSIS — M3500 Sicca syndrome, unspecified: Secondary | ICD-10-CM | POA: Diagnosis not present

## 2012-12-21 DIAGNOSIS — I10 Essential (primary) hypertension: Secondary | ICD-10-CM | POA: Diagnosis not present

## 2012-12-22 ENCOUNTER — Ambulatory Visit (INDEPENDENT_AMBULATORY_CARE_PROVIDER_SITE_OTHER): Payer: Medicare Other | Admitting: Physician Assistant

## 2012-12-22 ENCOUNTER — Encounter: Payer: Self-pay | Admitting: Physician Assistant

## 2012-12-22 VITALS — BP 118/72 | HR 66 | Ht 63.0 in | Wt 160.0 lb

## 2012-12-22 DIAGNOSIS — I4891 Unspecified atrial fibrillation: Secondary | ICD-10-CM

## 2012-12-22 DIAGNOSIS — I10 Essential (primary) hypertension: Secondary | ICD-10-CM | POA: Diagnosis not present

## 2012-12-22 MED ORDER — DILTIAZEM HCL ER COATED BEADS 120 MG PO CP24: 120.0000 mg | ORAL_CAPSULE | Freq: Every day | ORAL | Status: DC

## 2012-12-22 NOTE — Progress Notes (Signed)
[address] [place], [place]  [address] Phone: [phone] Fax:  [phone]  Date:  12/22/2012   ID:  EBONEY CLAYBROOK, DOB September 03, 1935, MRN [id number]  PCP:  Cala Bradford, MD  Cardiologist:  Dr. Olga Millers     History of Present Illness: Krystal Clarke is a 77 y.o. female who returns for follow up after a recent admission to the hospital.   She has a hx of Sjogren's syndrome, seizure disorder, HTN, HL, peripheral neuropathy.  She was admitted 9/21-9/25 after presenting with overall functional decline, cough and generalized weakness and malaise. She was found to be in atrial fibrillation with RVR.  CHADS2-VASc=4.  She was placed on Xarelto for anticoagulation. She was placed on empiric antibiotics for  presumed community-acquired pneumonia.  Echo (12/07/12): Moderate focal basal hypertrophy of the septum, vigorous LV function, EF 65-70%, indeterminate diastolic function, normal wall motion, trivial MR, moderate LAE, trivial TR.  She was followed by cardiology. She was continued on rate control and Xarelto. Cardioversion could be considered in the next 3-4 weeks if she remains in atrial fibrillation.  She remains weak. She denies chest pain. She walks with a walker. She denies significant dyspnea. She denies orthopnea, PND or edema. She denies syncope. She denies palpitations. She's had some right facial numbness as well as right arm weakness. Her PCP has arranged a brain MRI next week.  Labs (9/14):  K 3.3, creatinine 0.54, albumin 2.9, ALT 10, Hgb 12.3, TSH 0.834  Wt Readings from Last 3 Encounters:  12/14/12 154 lb (69.854 kg)  12/06/12 157 lb 10.1 oz (71.5 kg)  12/06/12 156 lb (70.761 kg)     Past Medical History  Diagnosis Date  . Hypertension   . Peripheral neuropathy   . Seizures   . Sjogren's syndrome   . Gait disorder 06/05/2012  . Migraines   . Paroxysmal atrial fibrillation 12/06/2012    Echo (12/07/12): Moderate focal basal hypertrophy of the septum,  vigorous LV function, EF 65-70%, indeterminate diastolic function, normal wall motion, trivial MR, moderate LAE, trivial TR;  Xarelto started 11/2012    Current Outpatient Prescriptions  Medication Sig Dispense Refill  . diltiazem (CARDIZEM CD) 180 MG 24 hr capsule Take 1 capsule (180 mg total) by mouth daily.  30 capsule  0  . divalproex (DEPAKOTE ER) 250 MG 24 hr tablet Take 250 mg by mouth 2 (two) times daily.      . dorzolamide-timolol (COSOPT) 22.3-6.8 MG/ML ophthalmic solution Place 1 drop into both eyes 2 (two) times daily.       . fenofibrate (TRICOR) 145 MG tablet Take 145 mg by mouth daily.      Marland Kitchen levofloxacin (LEVAQUIN) 750 MG tablet Take 1 tablet (750 mg total) by mouth daily after breakfast.  3 tablet  0  . LORazepam (ATIVAN) 0.5 MG tablet Take 0.5 mg by mouth every 8 (eight) hours as needed for anxiety.       . meloxicam (MOBIC) 15 MG tablet Take 15 mg by mouth daily.       . pilocarpine (SALAGEN) 5 MG tablet Take 5 mg by mouth 3 (three) times daily.      . pregabalin (LYRICA) 75 MG capsule Take 75 mg by mouth 2 (two) times daily.      . Rivaroxaban (XARELTO) 20 MG TABS tablet Take 1 tablet (20 mg total) by mouth daily with supper.  30 tablet  2  . rizatriptan (MAXALT) 10 MG tablet Take 1 pill daily  as needed for migraine. Try not to exceed 10 pills per month. May repeat in 2 hours if needed  30 tablet  3  . sucralfate (CARAFATE) 1 G tablet Take 1 g by mouth 4 (four) times daily.       . Vitamin D, Ergocalciferol, (DRISDOL) 50000 UNITS CAPS Take 50,000 Units by mouth every 7 (seven) days. Does not remember what day of the week she takes       No current facility-administered medications for this visit.    Allergies:    Allergies  Allergen Reactions  . Codeine Nausea Only  . Hydrocodone Nausea And Vomiting  . Morphine And Related Nausea Only  . Oxycodone-Aspirin     REACTION: DEATHLY SICK-VOMITTING    Social History:  The patient  reports that she has never smoked. She has  never used smokeless tobacco. She reports that she does not drink alcohol or use illicit drugs.   Family History:  The patient's family history is not on file.   ROS:  Please see the history of present illness.      All other systems reviewed and negative.   PHYSICAL EXAM: VS:  BP 118/72  Pulse 66  Ht 5\' 3"  (1.6 m)  Wt 160 lb (72.576 kg)  BMI 28.35 kg/m2 Well nourished, well developed, in no acute distress HEENT: normal Neck: no JVD Cardiac:  normal S1, S2; RRR; no murmur Lungs:  clear to auscultation bilaterally, no wheezing, rhonchi or rales Abd: soft, nontender, no hepatomegaly Ext: no edema Skin: warm and dry Neuro:  CNs 2-12 intact, no focal abnormalities noted  EKG:  NSR, HR 66, normal axis, no acute changes     ASSESSMENT AND PLAN:  1. Atrial Fibrillation:  She has converted to normal sinus rhythm. Continue Xarelto. Given her weakness and ongoing nausea, I will decrease her Diltiazem to 120 QD. 2. Hypertension:  Controlled.  3. Disposition:  F/u with Dr. Olga Millers in 2-3 mos.  Signed, Tereso Newcomer, PA-C  12/22/2012 1:48 PM

## 2012-12-22 NOTE — Patient Instructions (Addendum)
DECREASE DILTIAZEM TO 120 MG DAILY; A NEW RX WAS SENT IN TODAY TO [facility] ON [address] FOR THE 120 MG; YOU CAN FINISH YOUR BOTTLE OF THE 180 MG BEFORE STARTING ON THE 120 MG  PLEASE FOLLOW UP WITH DR. CRENSHAW 03/01/13 @ 9:30

## 2012-12-23 ENCOUNTER — Ambulatory Visit
Admission: RE | Admit: 2012-12-23 | Discharge: 2012-12-23 | Disposition: A | Discharge status: Home / Self Care | Payer: Medicare Other | Source: Ambulatory Visit | Attending: Family Medicine | Admitting: Family Medicine

## 2012-12-23 DIAGNOSIS — R200 Anesthesia of skin: Secondary | ICD-10-CM

## 2012-12-23 DIAGNOSIS — IMO0001 Reserved for inherently not codable concepts without codable children: Secondary | ICD-10-CM | POA: Diagnosis not present

## 2012-12-23 DIAGNOSIS — I6350 Cerebral infarction due to unspecified occlusion or stenosis of unspecified cerebral artery: Secondary | ICD-10-CM | POA: Diagnosis not present

## 2012-12-23 DIAGNOSIS — M1990 Unspecified osteoarthritis, unspecified site: Secondary | ICD-10-CM | POA: Diagnosis not present

## 2012-12-23 DIAGNOSIS — G609 Hereditary and idiopathic neuropathy, unspecified: Secondary | ICD-10-CM | POA: Diagnosis not present

## 2012-12-23 DIAGNOSIS — I4891 Unspecified atrial fibrillation: Secondary | ICD-10-CM | POA: Diagnosis not present

## 2012-12-23 DIAGNOSIS — I10 Essential (primary) hypertension: Secondary | ICD-10-CM | POA: Diagnosis not present

## 2012-12-23 DIAGNOSIS — D518 Other vitamin B12 deficiency anemias: Secondary | ICD-10-CM | POA: Diagnosis not present

## 2012-12-25 DIAGNOSIS — I10 Essential (primary) hypertension: Secondary | ICD-10-CM | POA: Diagnosis not present

## 2012-12-25 DIAGNOSIS — D518 Other vitamin B12 deficiency anemias: Secondary | ICD-10-CM | POA: Diagnosis not present

## 2012-12-25 DIAGNOSIS — G609 Hereditary and idiopathic neuropathy, unspecified: Secondary | ICD-10-CM | POA: Diagnosis not present

## 2012-12-25 DIAGNOSIS — I4891 Unspecified atrial fibrillation: Secondary | ICD-10-CM | POA: Diagnosis not present

## 2012-12-25 DIAGNOSIS — IMO0001 Reserved for inherently not codable concepts without codable children: Secondary | ICD-10-CM | POA: Diagnosis not present

## 2012-12-25 DIAGNOSIS — M1990 Unspecified osteoarthritis, unspecified site: Secondary | ICD-10-CM | POA: Diagnosis not present

## 2012-12-27 ENCOUNTER — Other Ambulatory Visit: Payer: Medicare Other

## 2012-12-28 DIAGNOSIS — G609 Hereditary and idiopathic neuropathy, unspecified: Secondary | ICD-10-CM | POA: Diagnosis not present

## 2012-12-28 DIAGNOSIS — I10 Essential (primary) hypertension: Secondary | ICD-10-CM | POA: Diagnosis not present

## 2012-12-28 DIAGNOSIS — I4891 Unspecified atrial fibrillation: Secondary | ICD-10-CM | POA: Diagnosis not present

## 2012-12-28 DIAGNOSIS — M1990 Unspecified osteoarthritis, unspecified site: Secondary | ICD-10-CM | POA: Diagnosis not present

## 2012-12-28 DIAGNOSIS — IMO0001 Reserved for inherently not codable concepts without codable children: Secondary | ICD-10-CM | POA: Diagnosis not present

## 2012-12-28 DIAGNOSIS — D518 Other vitamin B12 deficiency anemias: Secondary | ICD-10-CM | POA: Diagnosis not present

## 2012-12-30 DIAGNOSIS — I4891 Unspecified atrial fibrillation: Secondary | ICD-10-CM | POA: Diagnosis not present

## 2012-12-30 DIAGNOSIS — M1990 Unspecified osteoarthritis, unspecified site: Secondary | ICD-10-CM | POA: Diagnosis not present

## 2012-12-30 DIAGNOSIS — I10 Essential (primary) hypertension: Secondary | ICD-10-CM | POA: Diagnosis not present

## 2012-12-30 DIAGNOSIS — G609 Hereditary and idiopathic neuropathy, unspecified: Secondary | ICD-10-CM | POA: Diagnosis not present

## 2012-12-30 DIAGNOSIS — IMO0001 Reserved for inherently not codable concepts without codable children: Secondary | ICD-10-CM | POA: Diagnosis not present

## 2012-12-30 DIAGNOSIS — D518 Other vitamin B12 deficiency anemias: Secondary | ICD-10-CM | POA: Diagnosis not present

## 2013-01-01 DIAGNOSIS — D518 Other vitamin B12 deficiency anemias: Secondary | ICD-10-CM | POA: Diagnosis not present

## 2013-01-01 DIAGNOSIS — I10 Essential (primary) hypertension: Secondary | ICD-10-CM | POA: Diagnosis not present

## 2013-01-01 DIAGNOSIS — I4891 Unspecified atrial fibrillation: Secondary | ICD-10-CM | POA: Diagnosis not present

## 2013-01-01 DIAGNOSIS — IMO0001 Reserved for inherently not codable concepts without codable children: Secondary | ICD-10-CM | POA: Diagnosis not present

## 2013-01-01 DIAGNOSIS — G609 Hereditary and idiopathic neuropathy, unspecified: Secondary | ICD-10-CM | POA: Diagnosis not present

## 2013-01-01 DIAGNOSIS — M1990 Unspecified osteoarthritis, unspecified site: Secondary | ICD-10-CM | POA: Diagnosis not present

## 2013-01-04 DIAGNOSIS — I10 Essential (primary) hypertension: Secondary | ICD-10-CM | POA: Diagnosis not present

## 2013-01-04 DIAGNOSIS — I4891 Unspecified atrial fibrillation: Secondary | ICD-10-CM | POA: Diagnosis not present

## 2013-01-04 DIAGNOSIS — IMO0001 Reserved for inherently not codable concepts without codable children: Secondary | ICD-10-CM | POA: Diagnosis not present

## 2013-01-04 DIAGNOSIS — D518 Other vitamin B12 deficiency anemias: Secondary | ICD-10-CM | POA: Diagnosis not present

## 2013-01-04 DIAGNOSIS — M1990 Unspecified osteoarthritis, unspecified site: Secondary | ICD-10-CM | POA: Diagnosis not present

## 2013-01-04 DIAGNOSIS — G609 Hereditary and idiopathic neuropathy, unspecified: Secondary | ICD-10-CM | POA: Diagnosis not present

## 2013-01-05 ENCOUNTER — Other Ambulatory Visit: Payer: Self-pay

## 2013-01-05 MED ORDER — RIVAROXABAN 20 MG PO TABS: 20.0000 mg | ORAL_TABLET | Freq: Every day | ORAL | Status: DC

## 2013-01-06 DIAGNOSIS — I4891 Unspecified atrial fibrillation: Secondary | ICD-10-CM | POA: Diagnosis not present

## 2013-01-06 DIAGNOSIS — M1990 Unspecified osteoarthritis, unspecified site: Secondary | ICD-10-CM | POA: Diagnosis not present

## 2013-01-06 DIAGNOSIS — G609 Hereditary and idiopathic neuropathy, unspecified: Secondary | ICD-10-CM | POA: Diagnosis not present

## 2013-01-06 DIAGNOSIS — IMO0001 Reserved for inherently not codable concepts without codable children: Secondary | ICD-10-CM | POA: Diagnosis not present

## 2013-01-06 DIAGNOSIS — D518 Other vitamin B12 deficiency anemias: Secondary | ICD-10-CM | POA: Diagnosis not present

## 2013-01-06 DIAGNOSIS — I10 Essential (primary) hypertension: Secondary | ICD-10-CM | POA: Diagnosis not present

## 2013-01-08 ENCOUNTER — Telehealth: Payer: Self-pay | Admitting: *Deleted

## 2013-01-08 DIAGNOSIS — G43909 Migraine, unspecified, not intractable, without status migrainosus: Secondary | ICD-10-CM

## 2013-01-08 MED ORDER — DIVALPROEX SODIUM ER 250 MG PO TB24: 250.0000 mg | ORAL_TABLET | Freq: Every day | ORAL | Status: DC

## 2013-01-08 NOTE — Telephone Encounter (Signed)
Pt called and LMVM that had mild stroke from MRI.  Having headaches daily.  Medications not working for her headaches/migraines.  [phone].

## 2013-01-08 NOTE — Telephone Encounter (Signed)
Please call and advise patient that because of her recent small stroke I would advise her to no longer take any Maxalt or any other triptan such as Imitrex. I will restart her on Depakote ER, 250 mg once daily at night. I have entered a prescription and sent it to her pharmacy.

## 2013-01-08 NOTE — Telephone Encounter (Signed)
Called patient. Advised per Dr. Teofilo Pod previous note. Patient agreed.

## 2013-01-11 DIAGNOSIS — M1990 Unspecified osteoarthritis, unspecified site: Secondary | ICD-10-CM | POA: Diagnosis not present

## 2013-01-11 DIAGNOSIS — I10 Essential (primary) hypertension: Secondary | ICD-10-CM | POA: Diagnosis not present

## 2013-01-11 DIAGNOSIS — I4891 Unspecified atrial fibrillation: Secondary | ICD-10-CM | POA: Diagnosis not present

## 2013-01-11 DIAGNOSIS — IMO0001 Reserved for inherently not codable concepts without codable children: Secondary | ICD-10-CM | POA: Diagnosis not present

## 2013-01-11 DIAGNOSIS — G609 Hereditary and idiopathic neuropathy, unspecified: Secondary | ICD-10-CM | POA: Diagnosis not present

## 2013-01-11 DIAGNOSIS — D518 Other vitamin B12 deficiency anemias: Secondary | ICD-10-CM | POA: Diagnosis not present

## 2013-01-13 DIAGNOSIS — D518 Other vitamin B12 deficiency anemias: Secondary | ICD-10-CM | POA: Diagnosis not present

## 2013-01-13 DIAGNOSIS — I10 Essential (primary) hypertension: Secondary | ICD-10-CM | POA: Diagnosis not present

## 2013-01-13 DIAGNOSIS — M1990 Unspecified osteoarthritis, unspecified site: Secondary | ICD-10-CM | POA: Diagnosis not present

## 2013-01-13 DIAGNOSIS — IMO0001 Reserved for inherently not codable concepts without codable children: Secondary | ICD-10-CM | POA: Diagnosis not present

## 2013-01-13 DIAGNOSIS — I4891 Unspecified atrial fibrillation: Secondary | ICD-10-CM | POA: Diagnosis not present

## 2013-01-13 DIAGNOSIS — G609 Hereditary and idiopathic neuropathy, unspecified: Secondary | ICD-10-CM | POA: Diagnosis not present

## 2013-01-14 DIAGNOSIS — IMO0001 Reserved for inherently not codable concepts without codable children: Secondary | ICD-10-CM | POA: Diagnosis not present

## 2013-01-14 DIAGNOSIS — I10 Essential (primary) hypertension: Secondary | ICD-10-CM | POA: Diagnosis not present

## 2013-01-14 DIAGNOSIS — G609 Hereditary and idiopathic neuropathy, unspecified: Secondary | ICD-10-CM | POA: Diagnosis not present

## 2013-01-14 DIAGNOSIS — M1990 Unspecified osteoarthritis, unspecified site: Secondary | ICD-10-CM | POA: Diagnosis not present

## 2013-01-14 DIAGNOSIS — D518 Other vitamin B12 deficiency anemias: Secondary | ICD-10-CM | POA: Diagnosis not present

## 2013-01-14 DIAGNOSIS — I4891 Unspecified atrial fibrillation: Secondary | ICD-10-CM | POA: Diagnosis not present

## 2013-01-15 DIAGNOSIS — H9209 Otalgia, unspecified ear: Secondary | ICD-10-CM | POA: Diagnosis not present

## 2013-01-15 DIAGNOSIS — G43909 Migraine, unspecified, not intractable, without status migrainosus: Secondary | ICD-10-CM | POA: Diagnosis not present

## 2013-01-15 DIAGNOSIS — I6350 Cerebral infarction due to unspecified occlusion or stenosis of unspecified cerebral artery: Secondary | ICD-10-CM | POA: Diagnosis not present

## 2013-01-15 DIAGNOSIS — G479 Sleep disorder, unspecified: Secondary | ICD-10-CM | POA: Diagnosis not present

## 2013-01-18 DIAGNOSIS — D518 Other vitamin B12 deficiency anemias: Secondary | ICD-10-CM | POA: Diagnosis not present

## 2013-01-18 DIAGNOSIS — M1990 Unspecified osteoarthritis, unspecified site: Secondary | ICD-10-CM | POA: Diagnosis not present

## 2013-01-18 DIAGNOSIS — I10 Essential (primary) hypertension: Secondary | ICD-10-CM | POA: Diagnosis not present

## 2013-01-18 DIAGNOSIS — I4891 Unspecified atrial fibrillation: Secondary | ICD-10-CM | POA: Diagnosis not present

## 2013-01-18 DIAGNOSIS — IMO0001 Reserved for inherently not codable concepts without codable children: Secondary | ICD-10-CM | POA: Diagnosis not present

## 2013-01-18 DIAGNOSIS — G609 Hereditary and idiopathic neuropathy, unspecified: Secondary | ICD-10-CM | POA: Diagnosis not present

## 2013-01-20 DIAGNOSIS — M1990 Unspecified osteoarthritis, unspecified site: Secondary | ICD-10-CM | POA: Diagnosis not present

## 2013-01-20 DIAGNOSIS — IMO0001 Reserved for inherently not codable concepts without codable children: Secondary | ICD-10-CM | POA: Diagnosis not present

## 2013-01-20 DIAGNOSIS — D518 Other vitamin B12 deficiency anemias: Secondary | ICD-10-CM | POA: Diagnosis not present

## 2013-01-20 DIAGNOSIS — G609 Hereditary and idiopathic neuropathy, unspecified: Secondary | ICD-10-CM | POA: Diagnosis not present

## 2013-01-20 DIAGNOSIS — I4891 Unspecified atrial fibrillation: Secondary | ICD-10-CM | POA: Diagnosis not present

## 2013-01-20 DIAGNOSIS — I10 Essential (primary) hypertension: Secondary | ICD-10-CM | POA: Diagnosis not present

## 2013-01-22 DIAGNOSIS — IMO0001 Reserved for inherently not codable concepts without codable children: Secondary | ICD-10-CM | POA: Diagnosis not present

## 2013-01-22 DIAGNOSIS — I4891 Unspecified atrial fibrillation: Secondary | ICD-10-CM | POA: Diagnosis not present

## 2013-01-22 DIAGNOSIS — M1990 Unspecified osteoarthritis, unspecified site: Secondary | ICD-10-CM | POA: Diagnosis not present

## 2013-01-22 DIAGNOSIS — D518 Other vitamin B12 deficiency anemias: Secondary | ICD-10-CM | POA: Diagnosis not present

## 2013-01-22 DIAGNOSIS — I10 Essential (primary) hypertension: Secondary | ICD-10-CM | POA: Diagnosis not present

## 2013-01-22 DIAGNOSIS — G609 Hereditary and idiopathic neuropathy, unspecified: Secondary | ICD-10-CM | POA: Diagnosis not present

## 2013-01-25 DIAGNOSIS — H4011X0 Primary open-angle glaucoma, stage unspecified: Secondary | ICD-10-CM | POA: Diagnosis not present

## 2013-01-25 DIAGNOSIS — Z9889 Other specified postprocedural states: Secondary | ICD-10-CM | POA: Diagnosis not present

## 2013-01-25 DIAGNOSIS — H409 Unspecified glaucoma: Secondary | ICD-10-CM | POA: Diagnosis not present

## 2013-01-27 DIAGNOSIS — D518 Other vitamin B12 deficiency anemias: Secondary | ICD-10-CM | POA: Diagnosis not present

## 2013-01-27 DIAGNOSIS — G609 Hereditary and idiopathic neuropathy, unspecified: Secondary | ICD-10-CM | POA: Diagnosis not present

## 2013-01-27 DIAGNOSIS — IMO0001 Reserved for inherently not codable concepts without codable children: Secondary | ICD-10-CM | POA: Diagnosis not present

## 2013-01-27 DIAGNOSIS — I4891 Unspecified atrial fibrillation: Secondary | ICD-10-CM | POA: Diagnosis not present

## 2013-01-27 DIAGNOSIS — I10 Essential (primary) hypertension: Secondary | ICD-10-CM | POA: Diagnosis not present

## 2013-01-27 DIAGNOSIS — M1990 Unspecified osteoarthritis, unspecified site: Secondary | ICD-10-CM | POA: Diagnosis not present

## 2013-01-28 ENCOUNTER — Telehealth: Payer: Self-pay | Admitting: Cardiology

## 2013-01-28 DIAGNOSIS — D518 Other vitamin B12 deficiency anemias: Secondary | ICD-10-CM | POA: Diagnosis not present

## 2013-01-28 DIAGNOSIS — M1990 Unspecified osteoarthritis, unspecified site: Secondary | ICD-10-CM | POA: Diagnosis not present

## 2013-01-28 DIAGNOSIS — G609 Hereditary and idiopathic neuropathy, unspecified: Secondary | ICD-10-CM | POA: Diagnosis not present

## 2013-01-28 DIAGNOSIS — I4891 Unspecified atrial fibrillation: Secondary | ICD-10-CM | POA: Diagnosis not present

## 2013-01-28 DIAGNOSIS — IMO0001 Reserved for inherently not codable concepts without codable children: Secondary | ICD-10-CM | POA: Diagnosis not present

## 2013-01-28 DIAGNOSIS — I10 Essential (primary) hypertension: Secondary | ICD-10-CM | POA: Diagnosis not present

## 2013-01-28 MED ORDER — APIXABAN 5 MG PO TABS: 5.0000 mg | ORAL_TABLET | Freq: Two times a day (BID) | ORAL | Status: DC

## 2013-01-28 NOTE — Telephone Encounter (Signed)
Spoke with pt, she is scared of the xarelto. She has not had a back ache since having surgery. She had discussed with her PCP and they advised her to call us. Discussed with dr Jens Som, pt will be changed to eliquis 5 mg bid.

## 2013-01-28 NOTE — Telephone Encounter (Signed)
Continue xeralto for now and see if symptoms improve Krystal Clarke

## 2013-01-28 NOTE — Telephone Encounter (Signed)
Spoke with pt husband, he reports the pt has a new left hand numbness that started this week. He was told to follow up with neurology. He called to report the pt having pain in her back from the bra strap down to her bottom. She is also having pain in her breast. He has read and these asre the side effects listed for xarelto. They would like to know what to do, they feel it is related to this new med. Will forward for dr Jens Som review

## 2013-01-28 NOTE — Telephone Encounter (Signed)
New Problem:  Pt's husband states his wife, the pt, has been having back pain and she recently had a stroke... Pt's husband states she is and xeralto... Pt's husband would like to speak to the nurse about his wife... Please advise

## 2013-01-28 NOTE — Telephone Encounter (Signed)
Spoke with pt, aware of med change. Discount card placed at the front desk for pick up.

## 2013-02-01 DIAGNOSIS — D518 Other vitamin B12 deficiency anemias: Secondary | ICD-10-CM | POA: Diagnosis not present

## 2013-02-01 DIAGNOSIS — G609 Hereditary and idiopathic neuropathy, unspecified: Secondary | ICD-10-CM | POA: Diagnosis not present

## 2013-02-01 DIAGNOSIS — I4891 Unspecified atrial fibrillation: Secondary | ICD-10-CM | POA: Diagnosis not present

## 2013-02-01 DIAGNOSIS — I10 Essential (primary) hypertension: Secondary | ICD-10-CM | POA: Diagnosis not present

## 2013-02-01 DIAGNOSIS — M1990 Unspecified osteoarthritis, unspecified site: Secondary | ICD-10-CM | POA: Diagnosis not present

## 2013-02-01 DIAGNOSIS — IMO0001 Reserved for inherently not codable concepts without codable children: Secondary | ICD-10-CM | POA: Diagnosis not present

## 2013-02-03 DIAGNOSIS — M3500 Sicca syndrome, unspecified: Secondary | ICD-10-CM | POA: Diagnosis not present

## 2013-02-03 DIAGNOSIS — H04129 Dry eye syndrome of unspecified lacrimal gland: Secondary | ICD-10-CM | POA: Diagnosis not present

## 2013-02-04 DIAGNOSIS — M1710 Unilateral primary osteoarthritis, unspecified knee: Secondary | ICD-10-CM | POA: Diagnosis not present

## 2013-02-09 ENCOUNTER — Telehealth: Payer: Self-pay | Admitting: Cardiology

## 2013-02-09 NOTE — Telephone Encounter (Signed)
Spoke with pt husband, he reports his wife had an episode last night where it felt like a trembling in her chest. This morning she feels fine and is out of the house doing errands. His questions answered regarding atrial fib, heart rate and meds answered. He will call back if she has further problems.

## 2013-02-09 NOTE — Telephone Encounter (Signed)
New message  Patient is concerned about his wife. She is feeling nervous in her chest, no chest pains. Please call patients husband.

## 2013-02-17 DIAGNOSIS — Z23 Encounter for immunization: Secondary | ICD-10-CM | POA: Diagnosis not present

## 2013-02-27 ENCOUNTER — Other Ambulatory Visit: Payer: Self-pay | Admitting: Cardiology

## 2013-03-01 ENCOUNTER — Encounter: Payer: Medicare Other | Admitting: Cardiology

## 2013-03-01 ENCOUNTER — Encounter: Payer: Self-pay | Admitting: Cardiology

## 2013-03-01 ENCOUNTER — Other Ambulatory Visit: Payer: Self-pay | Admitting: *Deleted

## 2013-03-01 ENCOUNTER — Ambulatory Visit (INDEPENDENT_AMBULATORY_CARE_PROVIDER_SITE_OTHER): Payer: Medicare Other | Admitting: Cardiology

## 2013-03-01 VITALS — BP 154/86 | HR 65 | Ht 62.0 in | Wt 158.0 lb

## 2013-03-01 DIAGNOSIS — I4891 Unspecified atrial fibrillation: Secondary | ICD-10-CM | POA: Diagnosis not present

## 2013-03-01 DIAGNOSIS — I10 Essential (primary) hypertension: Secondary | ICD-10-CM

## 2013-03-01 LAB — CBC WITH DIFFERENTIAL/PLATELET
Basophils Absolute: 0 10*3/uL (ref 0.0–0.1)
Eosinophils Absolute: 0.1 10*3/uL (ref 0.0–0.7)
Eosinophils Relative: 0.7 % (ref 0.0–5.0)
Hemoglobin: 13 g/dL (ref 12.0–15.0)
Lymphocytes Relative: 30.4 % (ref 12.0–46.0)
MCHC: 33.5 g/dL (ref 30.0–36.0)
Neutro Abs: 4.9 10*3/uL (ref 1.4–7.7)
Neutrophils Relative %: 61.3 % (ref 43.0–77.0)
Platelets: 174 10*3/uL (ref 150.0–400.0)
RDW: 16.1 % — ABNORMAL HIGH (ref 11.5–14.6)

## 2013-03-01 LAB — BASIC METABOLIC PANEL
Calcium: 10.3 mg/dL (ref 8.4–10.5)
Chloride: 98 mEq/L (ref 96–112)
Creatinine, Ser: 0.8 mg/dL (ref 0.4–1.2)
Glucose, Bld: 93 mg/dL (ref 70–99)
Potassium: 4.3 mEq/L (ref 3.5–5.1)
Sodium: 135 mEq/L (ref 135–145)

## 2013-03-01 MED ORDER — APIXABAN 5 MG PO TABS: 5.0000 mg | ORAL_TABLET | Freq: Two times a day (BID) | ORAL | Status: DC

## 2013-03-01 NOTE — Progress Notes (Signed)
HPI: FU atrial fibrillation. She has a hx of Sjogren's syndrome, seizure disorder, HTN, HL, peripheral neuropathy. She was admitted 9/14 after presenting with overall functional decline, cough and generalized weakness and malaise. She was found to be in atrial fibrillation with RVR. CHADS2-VASc=4. She was placed on Xarelto for anticoagulation. Echo (12/07/12): Moderate focal basal hypertrophy of the septum, vigorous LV function, EF 65-70%, indeterminate diastolic function, normal wall motion, trivial MR, moderate LAE, trivial TR. She saw Tereso Newcomer back in October of 2014 and had converted to sinus rhythm. Since that time,     Current Outpatient Prescriptions  Medication Sig Dispense Refill  . apixaban (ELIQUIS) 5 MG TABS tablet Take 1 tablet (5 mg total) by mouth 2 (two) times daily.  60 tablet  0  . diltiazem (CARDIZEM CD) 120 MG 24 hr capsule Take 1 capsule (120 mg total) by mouth daily.  30 capsule  11  . divalproex (DEPAKOTE ER) 250 MG 24 hr tablet Take 1 tablet (250 mg total) by mouth at bedtime.  30 tablet  3  . dorzolamide-timolol (COSOPT) 22.3-6.8 MG/ML ophthalmic solution Place 1 drop into both eyes 2 (two) times daily.       . fenofibrate (TRICOR) 145 MG tablet Take 145 mg by mouth daily.      Marland Kitchen levofloxacin (LEVAQUIN) 750 MG tablet Take 1 tablet (750 mg total) by mouth daily after breakfast.  3 tablet  0  . LORazepam (ATIVAN) 0.5 MG tablet Take 0.5 mg by mouth every 8 (eight) hours as needed for anxiety.       . meloxicam (MOBIC) 15 MG tablet Take 15 mg by mouth daily.       . pilocarpine (SALAGEN) 5 MG tablet Take 5 mg by mouth 3 (three) times daily.      . pregabalin (LYRICA) 75 MG capsule Take 75 mg by mouth 2 (two) times daily.      . rizatriptan (MAXALT) 10 MG tablet Take 1 pill daily as needed for migraine. Try not to exceed 10 pills per month. May repeat in 2 hours if needed  30 tablet  3  . sucralfate (CARAFATE) 1 G tablet Take 1 g by mouth 4 (four) times daily.         . Vitamin D, Ergocalciferol, (DRISDOL) 50000 UNITS CAPS Take 50,000 Units by mouth every 7 (seven) days. Does not remember what day of the week she takes       No current facility-administered medications for this visit.     Past Medical History  Diagnosis Date  . Hypertension   . Peripheral neuropathy   . Seizures   . Sjogren's syndrome   . Gait disorder 06/05/2012  . Migraines   . Paroxysmal atrial fibrillation 12/06/2012    Echo (12/07/12): Moderate focal basal hypertrophy of the septum, vigorous LV function, EF 65-70%, indeterminate diastolic function, normal wall motion, trivial MR, moderate LAE, trivial TR;  Xarelto started 11/2012    Past Surgical History  Procedure Laterality Date  . Shoulder open rotator cuff repair    . Lumbar laminectomy    . Abdominal hysterectomy      History   Social History  . Marital Status: Married    Spouse Name: jesse    Number of Children: 2  . Years of Education: college   Occupational History  . retired    Social History Main Topics  . Smoking status: Never Smoker   . Smokeless tobacco: Never Used  . Alcohol Use:  No  . Drug Use: No  . Sexual Activity: Not on file   Other Topics Concern  . Not on file   Social History Narrative  . No narrative on file    ROS: no fevers or chills, productive cough, hemoptysis, dysphasia, odynophagia, melena, hematochezia, dysuria, hematuria, rash, seizure activity, orthopnea, PND, pedal edema, claudication. Remaining systems are negative.  Physical Exam: Well-developed well-nourished in no acute distress.  Skin is warm and dry.  HEENT is normal.  Neck is supple.  Chest is clear to auscultation with normal expansion.  Cardiovascular exam is regular rate and rhythm.  Abdominal exam nontender or distended. No masses palpated. Extremities show no edema. neuro grossly intact  ECG     This encounter was created in error - please disregard.

## 2013-03-01 NOTE — Assessment & Plan Note (Signed)
Blood pressure mildly elevated.continue present medications and follow. Increase as needed.

## 2013-03-01 NOTE — Patient Instructions (Signed)
Your physician wants you to follow-up in: 6 MONTHS WITH DR CRENSHAW You will receive a reminder letter in the mail two months in advance. If you don't receive a letter, please call our office to schedule the follow-up appointment.   Your physician recommends that you HAVE LAB WORK TODAY 

## 2013-03-01 NOTE — Assessment & Plan Note (Addendum)
Patient remains in sinus rhythm on examination. Continue Cardizem at present dose. Continue apixaban. Check renal function and hemoglobin. She has fallen previously. I have explained the risks related to her anticoagulation. Given multiple embolic risk factors I would like to continue for now. We can consider discontinuing in the future if we feel risk outweighs the benefit.

## 2013-03-01 NOTE — Progress Notes (Signed)
HPI: FU atrial fibrillation. She has a hx of Sjogren's syndrome, seizure disorder, HTN, HL, peripheral neuropathy. She was admitted 9/14 after presenting with overall functional decline, cough and generalized weakness and malaise. She was found to be in atrial fibrillation with RVR. CHADS2-VASc=4. She was placed on Xarelto for anticoagulation. Echo (12/07/12): Moderate focal basal hypertrophy of the septum, vigorous LV function, EF 65-70%, indeterminate diastolic function, normal wall motion, trivial MR, moderate LAE, trivial TR. She saw Tereso Newcomer back in October of 2014 and had converted to sinus rhythm. MRI in October of 2014 showed a very small acute/subacute left thalamic infarct. Since last seen, she denies dyspnea, chest pain, palpitations or syncope. No pedal edema.  Current Outpatient Prescriptions  Medication Sig Dispense Refill  . apixaban (ELIQUIS) 5 MG TABS tablet Take 1 tablet (5 mg total) by mouth 2 (two) times daily.  60 tablet  0  . diltiazem (CARDIZEM CD) 120 MG 24 hr capsule Take 1 capsule (120 mg total) by mouth daily.  30 capsule  11  . divalproex (DEPAKOTE ER) 250 MG 24 hr tablet Take 1 tablet (250 mg total) by mouth at bedtime.  30 tablet  3  . dorzolamide-timolol (COSOPT) 22.3-6.8 MG/ML ophthalmic solution Place 1 drop into both eyes 2 (two) times daily.       . fenofibrate (TRICOR) 145 MG tablet Take 145 mg by mouth daily.      Marland Kitchen levofloxacin (LEVAQUIN) 750 MG tablet Take 1 tablet (750 mg total) by mouth daily after breakfast.  3 tablet  0  . LORazepam (ATIVAN) 0.5 MG tablet Take 0.5 mg by mouth every 8 (eight) hours as needed for anxiety.       . meloxicam (MOBIC) 15 MG tablet Take 15 mg by mouth daily.       . pilocarpine (SALAGEN) 5 MG tablet Take 5 mg by mouth 3 (three) times daily.      . pregabalin (LYRICA) 75 MG capsule Take 75 mg by mouth 2 (two) times daily.      . rizatriptan (MAXALT) 10 MG tablet Take 1 pill daily as needed for migraine. Try not to exceed  10 pills per month. May repeat in 2 hours if needed  30 tablet  3  . sucralfate (CARAFATE) 1 G tablet Take 1 g by mouth 4 (four) times daily.       . Vitamin D, Ergocalciferol, (DRISDOL) 50000 UNITS CAPS Take 50,000 Units by mouth every 7 (seven) days. Does not remember what day of the week she takes       No current facility-administered medications for this visit.     Past Medical History  Diagnosis Date  . Hypertension   . Peripheral neuropathy   . Seizures   . Sjogren's syndrome   . Gait disorder 06/05/2012  . Migraines   . Paroxysmal atrial fibrillation 12/06/2012    Echo (12/07/12): Moderate focal basal hypertrophy of the septum, vigorous LV function, EF 65-70%, indeterminate diastolic function, normal wall motion, trivial MR, moderate LAE, trivial TR;  Xarelto started 11/2012    Past Surgical History  Procedure Laterality Date  . Shoulder open rotator cuff repair    . Lumbar laminectomy    . Abdominal hysterectomy      History   Social History  . Marital Status: Married    Spouse Name: jesse    Number of Children: 2  . Years of Education: college   Occupational History  . retired    Chief Executive Officer  History Main Topics  . Smoking status: Never Smoker   . Smokeless tobacco: Never Used  . Alcohol Use: No  . Drug Use: No  . Sexual Activity: Not on file   Other Topics Concern  . Not on file   Social History Narrative  . No narrative on file    ROS: no fevers or chills, productive cough, hemoptysis, dysphasia, odynophagia, melena, hematochezia, dysuria, hematuria, rash, seizure activity, orthopnea, PND, pedal edema, claudication. Remaining systems are negative.  Physical Exam: Well-developed chronically ill appearing in no acute distress.  Skin is warm and dry.  HEENT is normal.  Neck is supple.  Chest is clear to auscultation with normal expansion.  Cardiovascular exam is regular rate and rhythm.  Abdominal exam nontender or distended. No masses  palpated. Extremities show no edema. neuro grossly intact

## 2013-03-04 ENCOUNTER — Telehealth: Payer: Self-pay | Admitting: Neurology

## 2013-03-04 NOTE — Telephone Encounter (Signed)
Patient called stating that she needs to talk to someone about her migraines. Patient states she had a migraine that lasted for 9 days a couple of weeks ago and now they are coming back on and off again. Please call the patient.

## 2013-03-09 NOTE — Telephone Encounter (Signed)
Called and left a message. Voice mail stated patient's last name so I left a detailed message. I apologized for the delay in getting back with her. I inquired as to how she is doing now. I encouraged her to give an update regarding her migraines. Treatment options include increasing her Lyrica. She was recently advised not to take anymore Depakote. With her cardiac history I would not like for her to take a triptan. Pls advise patient in that regard, if she calls back.

## 2013-03-13 ENCOUNTER — Other Ambulatory Visit: Payer: Self-pay | Admitting: Neurology

## 2013-03-16 NOTE — Telephone Encounter (Signed)
I called the pharmacy, spoke with [facility].  She said they were told by the patient her dose was increased.  I advised we have left messages for the patient.  She said they will reach out to her and the spouse as well.

## 2013-03-16 NOTE — Telephone Encounter (Signed)
Krystal Foley, MD at 03/09/2013 5:29 PM     Status: Signed        Called and left a message. Voice mail stated patient's last name so I left a detailed message. I apologized for the delay in getting back with her. I inquired as to how she is doing now. I encouraged her to give an update regarding her migraines. Treatment options include increasing her Lyrica. She was recently advised not to take anymore Depakote. With her cardiac history I would not like for her to take a triptan. Pls advise patient in that regard, if she calls back.   I called patient at home and on cell, got no answer on either line.  Left messages asking for a return call to discuss meds.  She has told the pharmacy she is taking two po hs, but note from 12/23 says she d/c med.

## 2013-03-22 DIAGNOSIS — I6350 Cerebral infarction due to unspecified occlusion or stenosis of unspecified cerebral artery: Secondary | ICD-10-CM | POA: Diagnosis not present

## 2013-03-22 DIAGNOSIS — G43909 Migraine, unspecified, not intractable, without status migrainosus: Secondary | ICD-10-CM | POA: Diagnosis not present

## 2013-03-22 DIAGNOSIS — E559 Vitamin D deficiency, unspecified: Secondary | ICD-10-CM | POA: Diagnosis not present

## 2013-03-22 DIAGNOSIS — L940 Localized scleroderma [morphea]: Secondary | ICD-10-CM | POA: Diagnosis not present

## 2013-03-22 DIAGNOSIS — I4891 Unspecified atrial fibrillation: Secondary | ICD-10-CM | POA: Diagnosis not present

## 2013-04-01 DIAGNOSIS — M1710 Unilateral primary osteoarthritis, unspecified knee: Secondary | ICD-10-CM | POA: Diagnosis not present

## 2013-04-08 DIAGNOSIS — M1710 Unilateral primary osteoarthritis, unspecified knee: Secondary | ICD-10-CM | POA: Diagnosis not present

## 2013-04-12 ENCOUNTER — Other Ambulatory Visit: Payer: Self-pay | Admitting: Neurology

## 2013-04-14 ENCOUNTER — Other Ambulatory Visit: Payer: Self-pay | Admitting: Neurology

## 2013-04-15 ENCOUNTER — Encounter: Payer: Self-pay | Admitting: Neurology

## 2013-04-15 ENCOUNTER — Ambulatory Visit (INDEPENDENT_AMBULATORY_CARE_PROVIDER_SITE_OTHER): Payer: Medicare Other | Admitting: Neurology

## 2013-04-15 VITALS — BP 136/68 | HR 68 | Ht 62.5 in | Wt 162.0 lb

## 2013-04-15 DIAGNOSIS — G43109 Migraine with aura, not intractable, without status migrainosus: Secondary | ICD-10-CM | POA: Diagnosis not present

## 2013-04-15 LAB — VITAMIN B12: Vitamin B-12: 127 pg/mL — ABNORMAL LOW (ref 211–911)

## 2013-04-15 LAB — FOLATE: Folate: 9.6 ng/mL (ref >5.9)

## 2013-04-15 MED ORDER — CYCLOBENZAPRINE HCL 5 MG PO TABS: 5.0000 mg | ORAL_TABLET | Freq: Every day | ORAL | Status: DC

## 2013-04-15 MED ORDER — ONDANSETRON HCL 4 MG PO TABS: 4.0000 mg | ORAL_TABLET | Freq: Three times a day (TID) | ORAL | Status: DC | PRN

## 2013-04-15 MED ORDER — PREGABALIN 75 MG PO CAPS: ORAL_CAPSULE | ORAL | Status: DC

## 2013-04-15 NOTE — Patient Instructions (Addendum)
1.  For severe headaches, take flexiril 5mg  one tablet and ondansetron 4mg  one tablet 2.  For preventative headaches, increase Lyrica to 75mg  in the morning and two tablets in the evening 3.  Call me with an update next week.  If you are not doing well, can consider a trial of steroids 4.  Do not take aspirin, ibuprofen, or naprosyn for headache since you are on a blood thinner 5.. Fall precautions discussed 6.  Return to clinic 3-weeks

## 2013-04-15 NOTE — Progress Notes (Signed)
[facility] Clinic Note - Initial Visit   Date: 04/15/2013    Krystal Clarke MRN: [id number] DOB: 1935/07/02   Dear Dr Dema Severin:  Thank you for your kind referral of Krystal Clarke for consultation of migraines. Although her history is well known to you, please allow [place] to reiterate it for the purpose of our medical record. The patient was accompanied to the clinic by husband and who also provides collateral information.     History of Present Illness: Krystal Clarke is a 78 y.o. right-handed Caucasian female with history of hypertension, peripheral neuropathy, Sjogren's syndrome (diagnosed 2012), migraines, atrial fibrillation (on apixiban), history of shingles affecting left T6 (07/2009) and stroke (September 2014, mild word-finding difficulty) presenting for evaluation of worsening headaches.    She has been under the care of Dr. Erling Cruz at [facility] for a over 30 years and most recently Dr. Rexene Alberts.  She has several neurological problems including migraines, stroke, peripheral neuropathy, and gait instability, but her primary problem today is headaches.  She has a long history of migraines or started at the age of 70. Her headaches were well controlled on Depakote 250 twice a day.  However, it was recently stopped due to low platelets and starting anticoagulation.  She complains of right-sided throbbing and pulsating pain.  Headache now lasts about 2 days.  She applies heat and ice and takes tylenol which does help sometimes.  She has nausea, photophobia, and phonophobia.  She used to have vomiting, but does not have this any more.  Fives headache free days a month.  Previously tried medication for headaches is depakote, Botox (2 trials without benefit) and maxalt.  Being on apixiban, she is unable to take NSAIDs due to bleeding risk.  Unfortunately, she was admitted to the hospital in September for community-acquired pneumonia and found to have new onset atrial  fibrillation.  Her anticoagulation was switched from [place] to [place] (followed by Dr. Stanford Breed).  While she was hospitalized, her platelets were found to be slightly low (130), and since she was also on a blood thinner, depakote was stopped. She reports worsening of migraines since Depakote was stopped and is currently a not on any preventative medication. She was also taking Maxalt which helped, but due to her recent stroke this is contraindicated.  Due to worsening headaches, MRI of the brain was obtained in October which showed a small left thalamic subacute stroke.  Due to the limitations of time, I focused her visit today on her primary complaint which is migraines.  Out-side paper records, electronic medical record, and images have been reviewed where available and summarized as:  Labs 12/17/2012:  Na 134, K 4.4, Chloride 99, Ca 10.4, WBC 7.9, RBC 4.3, Hbg 12.2, Hct 36.9  MRI brain wo contrast 12/23/2012:  Very small acute/ subacute left thalamic nonhemorrhagic infarct.  Prominent small vessel disease type changes. No intracranial hemorrhage. Mild atrophy without hydrocephalus.  Prior clinic notes from Dr. Rexene Alberts, previous neurological workup includes: EEG 06/03/1995 and 08/31/2008: normal CSF 1991:  normal   Past Medical History  Diagnosis Date  . Hypertension   . Peripheral neuropathy   . Seizures   . Sjogren's syndrome   . Gait disorder 06/05/2012  . Migraines   . Paroxysmal atrial fibrillation 12/06/2012    Echo (12/07/12): Moderate focal basal hypertrophy of the septum, vigorous LV function, EF 65-70%, indeterminate diastolic function, normal wall motion, trivial MR, moderate LAE, trivial TR;  Xarelto started 11/2012  . Osteoarthritis   .  Stroke     Past Surgical History  Procedure Laterality Date  . Shoulder open rotator cuff repair    . Lumbar laminectomy    . Abdominal hysterectomy       Medications:  Current Outpatient Prescriptions on File Prior to Visit  Medication  Sig Dispense Refill  . apixaban (ELIQUIS) 5 MG TABS tablet Take 1 tablet (5 mg total) by mouth 2 (two) times daily.  180 tablet  1  . diltiazem (CARDIZEM CD) 120 MG 24 hr capsule Take 1 capsule (120 mg total) by mouth daily.  30 capsule  11  . divalproex (DEPAKOTE ER) 250 MG 24 hr tablet Take 1 tablet (250 mg total) by mouth at bedtime.  30 tablet  3  . dorzolamide-timolol (COSOPT) 22.3-6.8 MG/ML ophthalmic solution Place 1 drop into both eyes 2 (two) times daily.       . fenofibrate (TRICOR) 145 MG tablet Take 145 mg by mouth daily.      Marland Kitchen levofloxacin (LEVAQUIN) 750 MG tablet Take 1 tablet (750 mg total) by mouth daily after breakfast.  3 tablet  0  . LORazepam (ATIVAN) 0.5 MG tablet Take 0.5 mg by mouth every 8 (eight) hours as needed for anxiety.       . meloxicam (MOBIC) 15 MG tablet Take 15 mg by mouth daily.       . pilocarpine (SALAGEN) 5 MG tablet Take 5 mg by mouth 3 (three) times daily.      . pregabalin (LYRICA) 75 MG capsule Take 75 mg by mouth 2 (two) times daily.      . rizatriptan (MAXALT) 10 MG tablet TAKE 1 TABLET DAILY AS NEEDED FOR MIGRAINE., MAY REPEAT IN 2 HRS , (TRY NOT TO EXCEED 10/MONTH)  30 tablet  0  . sucralfate (CARAFATE) 1 G tablet Take 1 g by mouth 4 (four) times daily.       . Vitamin D, Ergocalciferol, (DRISDOL) 50000 UNITS CAPS Take 50,000 Units by mouth every 7 (seven) days. Does not remember what day of the week she takes       No current facility-administered medications on file prior to visit.    Allergies:  Allergies  Allergen Reactions  . Codeine Nausea Only  . Hydrocodone Nausea And Vomiting  . Morphine And Related Nausea Only  . Oxycodone-Aspirin     REACTION: DEATHLY SICK-VOMITTING    Family History: Family History  Problem Relation Age of Onset  . Cancer Father   . Stroke Mother     Social History: History   Social History  . Marital Status: Married    Spouse Name: jesse    Number of Children: 2  . Years of Education: college    Occupational History  . retired    Social History Main Topics  . Smoking status: Former Research scientist (life sciences)  . Smokeless tobacco: Never Used  . Alcohol Use: No  . Drug Use: No  . Sexual Activity: Not on file   Other Topics Concern  . Not on file   Social History Narrative  . No narrative on file    Review of Systems:  CONSTITUTIONAL: No fevers, chills, night sweats, or weight loss.   EYES: + visual changes or eye pain ENT: No hearing changes.  No history of nose bleeds.   RESPIRATORY: No cough, wheezing and shortness of breath.   CARDIOVASCULAR: Negative for chest pain, and palpitations.   GI: Negative for abdominal discomfort, blood in stools or black stools.  No recent change in bowel  habits.   GU:  No history of incontinence.   MUSCLOSKELETAL: No history of joint pain or swelling.  No myalgias.   SKIN: Negative for lesions, rash, and itching.   HEMATOLOGY/ONCOLOGY: Negative for prolonged bleeding, bruising easily, and swollen nodes.     ENDOCRINE: Negative for cold or heat intolerance, polydipsia or goiter.   PSYCH:  No depression or anxiety symptoms.   NEURO: As Above.   Vital Signs:  BP 136/68  Pulse 68  Ht 5' 2.5" (1.588 m)  Wt 162 lb (73.483 kg)  BMI 29.14 kg/m2   General Medical Exam:   General:  Well appearing, comfortable.   Eyes/ENT: see cranial nerve examination.  Mucus membranes are dry Neck: No masses appreciated.  Full range of motion without tenderness.  No carotid bruits. Respiratory:  Clear to auscultation, good air entry bilaterally.   Cardiac: Irregularly irregular rate and rhythm, no murmur.     Back:  No pain to palpation of spinous processes.   Extremities:  No deformities, edema, or skin discoloration. Good capillary refill.   Skin:  Skin color, texture, turgor normal. No rashes or lesions.  Neurological Exam: MENTAL STATUS including orientation to time, place, person, recent and remote memory, attention span and concentration, language, and fund of  knowledge is normal.  Speech is not dysarthric.  CRANIAL NERVES: II:  Severe visual impairment bilaterally, she is only able to see colors as blurs.  Fundoscopic examination is very limited. III-IV-VI: Irregularly pupils, but reactive to light.  Normal conjugate, extra-ocular eye movements in all directions of gaze. Mild bilateral ptosis without worsening with sustained upward gaze. V:  Normal facial sensation.   VII:  Normal facial symmetry and movements.  VIII:  Normal hearing and vestibular function.   IX-X:  Normal palatal movement.   XI:  Normal shoulder shrug and head rotation.   XII:  Normal tongue strength and range of motion, no deviation or fasciculation.  MOTOR:  No atrophy, fasciculations or abnormal movements.  No pronator drift.  Tone is normal.    Right Upper Extremity:    Left Upper Extremity:    Deltoid  5/5   Deltoid  5/5   Biceps  5/5   Biceps  5/5   Triceps  5/5   Triceps  5/5   Wrist extensors  5/5   Wrist extensors  5/5   Wrist flexors  5/5   Wrist flexors  5/5   Finger extensors  5/5   Finger extensors  5/5   Finger flexors  5/5   Finger flexors  5/5   Dorsal interossei  5/5   Dorsal interossei  5/5   Abductor pollicis  5/5   Abductor pollicis  5/5   Tone (Ashworth scale)  0  Tone (Ashworth scale)  0   Right Lower Extremity:    Left Lower Extremity:    Hip flexors  4+/5   Hip flexors  4+/5   Hip extensors  4+/5   Hip extensors  4+/5   Knee flexors  4+/5   Knee flexors  4+/5   Knee extensors  4+/5   Knee extensors  4+/5   Dorsiflexors  4/5   Dorsiflexors  4/5   Plantarflexors  4/5   Plantarflexors  4/5   Toe extensors  4/5   Toe extensors  4/5   Toe flexors  4/5   Toe flexors  4/5   Tone (Ashworth scale)  0  Tone (Ashworth scale)  0   MSRs:  Right  Left brachioradialis 2+  brachioradialis 2+  biceps 2+  biceps 2+  triceps 2+  triceps 2+  patellar 1+  patellar 0  ankle jerk 0  ankle jerk 0    Hoffman no  Hoffman no  plantar response down  plantar response down   SENSORY:  Length dependent pattern of sensory loss to light touch and  temperature distal to knees with pin prick reduced distal to mid-thigh bilaterally. Proprioception is impaired at the great toe bilaterally.  Hyperesthesia noted in the legs bilaterally. Romberg's sign is positive.  COORDINATION/GAIT:  bradykinesia with reduced speed and amplitude of finger tapping bilaterally and heel tapping is severely slowed.  Gait is moderately wide-based with small, short steps, slightly stooped posture.  She is able to take a few steps unassisted but appears unsteady.   IMPRESSION: 1.  Migraines without aura, worsening after depakote was stopped  Previously controlled with VPA and triptans, but due to recent stroke, anticoagulation, and low platelets, VPA was stopped and triptans is contraindicated  Discussed options of adding alternative preventative medication or trying to increase Lyrica and see if there is any improvement  For abortive therapy, we are limited with options because triptans are contraindicated and being on apixiban, she is unable to take NSAIDs due to bleeding risk.  Short course of steroids to try to break the pain cycle was discussed, but patient elected to optimize Lyrica first which is reasonable  Recheck platelets at next visit, if they are improving, may consider VPA again 2.  Mixed large and small fiber sensorimotor peripheral neuropathy  Etiology likely due to Sjogren's disease  Numbness involving the R > L legs with painful paresthesias of the lower legs  Per notes, EMG performed at [facility] in 1991 and shows axonal neuropathy  LP previously done shows normal CSF protein, glucose 62, no oligoclonal   Currently on Lyrica 75mg  BID which improves pain by >90%  Check other labs for other potentially treatable causes of neuropathy 3.  Gait instability  Walks with a 4-wheeled rollator.    She has  fallen three times in the past year, one while on abixaban.   Physical therapy performed 02/26/12 through 04/23/12  I had extensive discussion regarding risk of falls while on anticoagulation, especially she is moderately impaired with her gait and they express understanding.  I further discussed that the risks and benefits of stroke prevention vs bleeding complication due to falls needs to be considered if there is worsening gait problems. 4.  Sjogren's disease complicated by severe bilateral vision loss  Dependent for most of her ADLs on her husband. She is able to bathe and dress herself.  5.  Recent left thalamic stroke, likely cardioembolic  on apixiban 4.  Memory loss  Neuropsychological testing 05/25/2010: Decreased verbal fluency, otherwise normal exam.   PLAN/RECOMMENDATIONS:  1.  For severe headaches, take flexiril 5mg  one tablet and ondansetron 4mg  one tablet 2.  For preventative headaches, increase Lyrica to 75mg  in the morning and two tablets in the evening 3.  Check vitamin B12, copper, and folate 4.  Patient to call me with an update next week.  If she is not doing well, can consider a trial of steroids 5.  She has been instructed not to take aspirin, ibuprofen, or naprosyn for headache 6.  Counselled on fall precautions, especially while on anticoagulation 7.  Return to clinic 3-weeks      The duration of this appointment visit was 65 minutes of face-to-face time with the patient.  Greater than 50% of this time was spent in counseling, explanation of diagnosis, planning of further management, and coordination of care.   Thank you for allowing me to participate in patient's care.  If I can answer any additional questions, I would be pleased to do so.    Sincerely,    Donika K. Posey Pronto, DO

## 2013-04-16 ENCOUNTER — Encounter: Payer: Self-pay | Admitting: Neurology

## 2013-04-16 DIAGNOSIS — M1710 Unilateral primary osteoarthritis, unspecified knee: Secondary | ICD-10-CM | POA: Diagnosis not present

## 2013-04-17 LAB — COPPER, SERUM: Copper: 81 ug/dL (ref 70–175)

## 2013-04-19 ENCOUNTER — Telehealth: Payer: Self-pay | Admitting: Neurology

## 2013-04-19 MED ORDER — METHYLPREDNISOLONE (PAK) 4 MG PO TABS
ORAL_TABLET | ORAL | Status: DC
Start: 2013-04-19 — End: 2013-05-20

## 2013-04-19 NOTE — Telephone Encounter (Signed)
Pt states that she is not doing any better.  Her migraines start early in the afternoon and continue through the night.  I asked if she had followed instructions per Dr. Posey Pronto and upon further questioning, she stated that she had done everything except increase her a.m.  Lyrica. She had forgotten that. She said that she would start that this morning.  I have scheduled a f/u appt for May 20, 2013 at 10:30 a.m. (patient states that she was already scheduled for March 5th, however, she did not have an appointment on the schedule, so I rescheduled). Please advise.

## 2013-04-19 NOTE — Telephone Encounter (Signed)
Returned call to patient. She reports no benefit with Flexeril and Zofran and has continued to have headaches over the weekend. I have discussed the option of a short course of steroids and she is in agreement with this plan. Risk and benefits were discussed. I have asked her to take a PPI once she is on steroids.  Medrol Dosepak has been sent to her pharmacy.  Donika K. Posey Pronto, DO

## 2013-04-20 NOTE — Progress Notes (Signed)
faxed

## 2013-04-22 DIAGNOSIS — M1710 Unilateral primary osteoarthritis, unspecified knee: Secondary | ICD-10-CM | POA: Diagnosis not present

## 2013-04-22 DIAGNOSIS — IMO0002 Reserved for concepts with insufficient information to code with codable children: Secondary | ICD-10-CM | POA: Diagnosis not present

## 2013-04-22 DIAGNOSIS — M3500 Sicca syndrome, unspecified: Secondary | ICD-10-CM | POA: Diagnosis not present

## 2013-05-10 ENCOUNTER — Ambulatory Visit (INDEPENDENT_AMBULATORY_CARE_PROVIDER_SITE_OTHER): Payer: Medicare Other | Admitting: Neurology

## 2013-05-10 ENCOUNTER — Ambulatory Visit: Payer: Medicare Other

## 2013-05-10 DIAGNOSIS — E538 Deficiency of other specified B group vitamins: Secondary | ICD-10-CM

## 2013-05-10 DIAGNOSIS — M3500 Sicca syndrome, unspecified: Secondary | ICD-10-CM | POA: Diagnosis not present

## 2013-05-10 DIAGNOSIS — H04129 Dry eye syndrome of unspecified lacrimal gland: Secondary | ICD-10-CM | POA: Diagnosis not present

## 2013-05-10 MED ORDER — CYANOCOBALAMIN 1000 MCG/ML IJ SOLN
1000.0000 ug | Freq: Once | INTRAMUSCULAR | Status: AC
  Administered 2013-05-10: 1000 ug via INTRAMUSCULAR

## 2013-05-10 NOTE — Progress Notes (Signed)
Patient in for first b12 injection. She will return tomorrow for her next injection

## 2013-05-11 ENCOUNTER — Ambulatory Visit: Payer: Medicare Other

## 2013-05-13 ENCOUNTER — Ambulatory Visit: Payer: Medicare Other

## 2013-05-14 ENCOUNTER — Ambulatory Visit: Payer: Medicare Other

## 2013-05-17 ENCOUNTER — Ambulatory Visit (INDEPENDENT_AMBULATORY_CARE_PROVIDER_SITE_OTHER): Payer: Medicare Other | Admitting: Neurology

## 2013-05-17 DIAGNOSIS — E538 Deficiency of other specified B group vitamins: Secondary | ICD-10-CM | POA: Diagnosis not present

## 2013-05-17 MED ORDER — CYANOCOBALAMIN 1000 MCG/ML IJ SOLN
1000.0000 ug | Freq: Once | INTRAMUSCULAR | Status: AC
  Administered 2013-05-17: 1000 ug via INTRAMUSCULAR

## 2013-05-17 NOTE — Progress Notes (Signed)
Pt in for B12.

## 2013-05-18 ENCOUNTER — Ambulatory Visit (INDEPENDENT_AMBULATORY_CARE_PROVIDER_SITE_OTHER): Payer: Medicare Other | Admitting: Neurology

## 2013-05-18 DIAGNOSIS — E538 Deficiency of other specified B group vitamins: Secondary | ICD-10-CM | POA: Diagnosis not present

## 2013-05-18 MED ORDER — CYANOCOBALAMIN 1000 MCG/ML IJ SOLN: 1000.0000 ug | Freq: Once | INTRAMUSCULAR | Status: DC

## 2013-05-18 MED ORDER — CYANOCOBALAMIN 1000 MCG/ML IJ SOLN
1000.0000 ug | Freq: Once | INTRAMUSCULAR | Status: AC
  Administered 2013-05-18: 1000 ug via INTRAMUSCULAR

## 2013-05-18 NOTE — Progress Notes (Signed)
Patient here for vitamin B12 injections.

## 2013-05-19 ENCOUNTER — Ambulatory Visit (INDEPENDENT_AMBULATORY_CARE_PROVIDER_SITE_OTHER): Payer: Medicare Other | Admitting: Neurology

## 2013-05-19 ENCOUNTER — Other Ambulatory Visit: Payer: Self-pay | Admitting: *Deleted

## 2013-05-19 DIAGNOSIS — E538 Deficiency of other specified B group vitamins: Secondary | ICD-10-CM

## 2013-05-19 MED ORDER — CYANOCOBALAMIN 1000 MCG/ML IJ SOLN
1000.0000 ug | Freq: Once | INTRAMUSCULAR | Status: AC
  Administered 2013-05-19: 1000 ug via INTRAMUSCULAR

## 2013-05-19 NOTE — Progress Notes (Signed)
Patient in for b-12 injection 

## 2013-05-20 ENCOUNTER — Encounter: Payer: Self-pay | Admitting: Neurology

## 2013-05-20 ENCOUNTER — Ambulatory Visit (INDEPENDENT_AMBULATORY_CARE_PROVIDER_SITE_OTHER): Payer: Medicare Other | Admitting: Neurology

## 2013-05-20 VITALS — BP 130/94 | HR 84 | Temp 98.5°F | Wt 166.2 lb

## 2013-05-20 DIAGNOSIS — G43109 Migraine with aura, not intractable, without status migrainosus: Secondary | ICD-10-CM | POA: Diagnosis not present

## 2013-05-20 DIAGNOSIS — E538 Deficiency of other specified B group vitamins: Secondary | ICD-10-CM

## 2013-05-20 DIAGNOSIS — G459 Transient cerebral ischemic attack, unspecified: Secondary | ICD-10-CM | POA: Diagnosis not present

## 2013-05-20 MED ORDER — CYANOCOBALAMIN 1000 MCG/ML IJ SOLN
1000.0000 ug | Freq: Once | INTRAMUSCULAR | Status: AC
  Administered 2013-05-20: 1000 ug via INTRAMUSCULAR

## 2013-05-20 NOTE — Patient Instructions (Signed)
1.  For preventative headaches, continue Lyrica to 75mg  in the morning and two tablets in the evening 2.  OK to use excedrin migraine as needed for low-grade headaches.  Try to limit to twice per week only 3.  Continue vitamin B12 1027mcg weekly x 4 week, then monthly thereafter 4.  Return clinic in 3-months, or sooner

## 2013-05-20 NOTE — Progress Notes (Signed)
Follow-up Visit   Date: 05/20/2013    Krystal Clarke MRN: [id number] DOB: December 31, 1935   [name] is a delightful 78 y.o. right-handed Caucasian female with history of history of hypertension, peripheral neuropathy, Sjogren's syndrome (diagnosed 2012), migraines, atrial fibrillation (on apixiban), history of shingles affecting left T6 (07/2009) and stroke (September 2014, mild word-finding difficulty) returning to the clinic for follow-up of headaches.  She was last seen in the clinic on 04/15/2013.  History of present illness: She has been under the care of Dr. Erling Cruz at [facility] for a over 30 years and most recently Dr. Rexene Alberts. She has several neurological problems including migraines, stroke, peripheral neuropathy, and gait instability, but her primary problem today is headaches.  She has a long history of migraines or started at the age of 48. Her headaches were well controlled on Depakote 250 twice a day. However, it was recently stopped due to low platelets and starting anticoagulation. She complains of right-sided throbbing and pulsating pain. Headache now lasts about 2 days. She applies heat and ice and takes tylenol which does help sometimes. She has nausea, photophobia, and phonophobia. She used to have vomiting, but does not have this any more. Fives headache free days a month. Previously tried medication for headaches is depakote, Botox (2 trials without benefit) and maxalt. Being on apixiban, she is unable to take NSAIDs due to bleeding risk.   Unfortunately, she was admitted to the hospital in September for community-acquired pneumonia and found to have new onset atrial fibrillation. Her anticoagulation was switched from [place] to [place] (followed by Dr. Stanford Breed). While she was hospitalized, her platelets were found to be slightly low (130), and since she was also on a blood thinner, depakote was stopped. She reports worsening of migraines since Depakote was  stopped and is currently a not on any preventative medication. She was also taking Maxalt which helped, but due to her recent stroke this is contraindicated. Due to worsening headaches, MRI of the brain was obtained in October which showed a small left thalamic subacute stroke.  Due to the limitations of time, I focused her visit today on her primary complaint which is migraines.   - Follow-up 05/20/2013: No benefit with flexeril for headaches.  After a short course of steroids, her headache significant improved. She gets low-grade (4/10) which responds to excedrin migraine (1 pill).  She reports feeling about 80-90% better.  At her last visit, she was found to have vitamin B12 deficiency and was started on injections.  Energy level is also much improved after started vitamin b12 injections.  She is plannning to have eye surgery next week at [facility].   Medications:  Current Outpatient Prescriptions on File Prior to Visit  Medication Sig Dispense Refill  . apixaban (ELIQUIS) 5 MG TABS tablet Take 1 tablet (5 mg total) by mouth 2 (two) times daily.  180 tablet  1  . cyanocobalamin (,VITAMIN B-12,) 1000 MCG/ML injection Inject 1 mL (1,000 mcg total) into the muscle once.  1 mL  0  . cyanocobalamin (,VITAMIN B-12,) 1000 MCG/ML injection Inject 1 mL (1,000 mcg total) into the muscle once.  1 mL  0  . diltiazem (CARDIZEM CD) 120 MG 24 hr capsule Take 1 capsule (120 mg total) by mouth daily.  30 capsule  11  . divalproex (DEPAKOTE ER) 250 MG 24 hr tablet Take 1 tablet (250 mg total) by mouth at bedtime.  30 tablet  3  . dorzolamide-timolol (COSOPT) 22.3-6.8  MG/ML ophthalmic solution Place 1 drop into both eyes 2 (two) times daily.       . fenofibrate (TRICOR) 145 MG tablet Take 145 mg by mouth daily.      Marland Kitchen LORazepam (ATIVAN) 0.5 MG tablet Take 0.5 mg by mouth every 8 (eight) hours as needed for anxiety.       . methylPREDNIsolone (MEDROL DOSPACK) 4 MG tablet follow package directions  21 tablet  0  .  ondansetron (ZOFRAN) 4 MG tablet Take 1 tablet (4 mg total) by mouth every 8 (eight) hours as needed for nausea or vomiting.  90 tablet  0  . pilocarpine (SALAGEN) 5 MG tablet Take 5 mg by mouth 3 (three) times daily.      . pregabalin (LYRICA) 75 MG capsule One tablet in the morning and two tablet at bedtime.  270 capsule  3  . rizatriptan (MAXALT) 10 MG tablet TAKE 1 TABLET DAILY AS NEEDED FOR MIGRAINE., MAY REPEAT IN 2 HRS , (TRY NOT TO EXCEED 10/MONTH)  30 tablet  0  . sucralfate (CARAFATE) 1 G tablet Take 1 g by mouth 4 (four) times daily.       . Vitamin D, Ergocalciferol, (DRISDOL) 50000 UNITS CAPS Take 50,000 Units by mouth every 7 (seven) days. Does not remember what day of the week she takes       No current facility-administered medications on file prior to visit.    Allergies:  Allergies  Allergen Reactions  . Codeine Nausea Only  . Hydrocodone Nausea And Vomiting  . Morphine And Related Nausea Only  . Oxycodone-Aspirin     REACTION: DEATHLY SICK-VOMITTING     Review of Systems:  CONSTITUTIONAL: No fevers, chills, night sweats, or weight loss.   EYES: +visual changes or eye pain ENT: No hearing changes.  No history of nose bleeds.   RESPIRATORY: No cough, wheezing and shortness of breath.   CARDIOVASCULAR: Negative for chest pain, and palpitations.   GI: Negative for abdominal discomfort, blood in stools or black stools.  No recent change in bowel habits.   GU:  No history of incontinence.   MUSCLOSKELETAL: No history of joint pain or swelling.  No myalgias.   SKIN: Negative for lesions, rash, and itching.   ENDOCRINE: Negative for cold or heat intolerance, polydipsia or goiter.   PSYCH:  No depression or anxiety symptoms.   NEURO: As Above.   Vital Signs:  BP 130/94  Pulse 84  Temp(Src) 98.5 F (36.9 C)  Wt 166 lb 3 oz (75.382 kg)  SpO2 93%  Neurological Exam: MENTAL STATUS including orientation to time, place, person, recent and remote memory, attention span  and concentration, language, and fund of knowledge is normal.  Speech is not dysarthric.  CRANIAL NERVES: Severe visual impairment bilaterally, she is only able to see colors as blurs. Irregular pupils, but reactive to light. Normal conjugate, extra-ocular eye movements in all directions of gaze. Mild bilateral ptosis without worsening with sustained upward gaze. Face is symmetric. Palate elevates symmetrically.  Tongue is midline.  Mucus membranes are dry.  MOTOR: No atrophy, fasciculations or abnormal movements. No pronator drift. Tone is normal.  Right Upper Extremity:    Left Upper Extremity:    Deltoid  5/5   Deltoid  5/5   Biceps  5/5   Biceps  5/5   Triceps  5/5   Triceps  5/5   Wrist extensors  5/5   Wrist extensors  5/5   Wrist flexors  5/5  Wrist flexors  5/5   Finger extensors  5/5   Finger extensors  5/5   Finger flexors  5/5   Finger flexors  5/5   Dorsal interossei  5/5   Dorsal interossei  5/5   Abductor pollicis  5/5   Abductor pollicis  5/5   Tone (Ashworth scale)  0   Tone (Ashworth scale)  0    Right Lower Extremity:    Left Lower Extremity:    Hip flexors  4+/5  Hip flexors  4+/5  Hip extensors  4+/5  Hip extensors  4+/5  Knee flexors  4+/5  Knee flexors  4+/5  Knee extensors  4+/5  Knee extensors  4+/5  Dorsiflexors  4/5   Dorsiflexors  4/5   Plantarflexors  4/5   Plantarflexors  4/5   Toe extensors  4/5   Toe extensors  4/5   Toe flexors  4/5   Toe flexors  4/5   Tone (Ashworth scale)  0   Tone (Ashworth scale)  0    MSRs:  Reflexes are 2+/4 in the upper extremities and absent in the legs bilaterally.  SENSORY: Length dependent pattern of sensory loss to light touch and temperature distal to knees.    COORDINATION/GAIT:  bradykinesia with reduced speed and amplitude of finger tapping bilaterally and heel tapping is severely slowed. Gait is moderately wide-based with small, short steps, slightly stooped posture and assisted with walker.   Data: Labs  12/17/2012: Na 134, K 4.4, Chloride 99, Ca 10.4, WBC 7.9, RBC 4.3, Hbg 12.2, Hct 36.9  MRI brain wo contrast 12/23/2012: Very small acute/ subacute left thalamic nonhemorrhagic infarct. Prominent small vessel disease type changes. No intracranial hemorrhage. Mild atrophy without hydrocephalus.  Prior clinic notes from Dr. Rexene Alberts, previous neurological workup includes:  EEG 06/03/1995 and 08/31/2008: normal  CSF 1991: normal  Lab Results  Component Value Date   VITAMINB12 127* 04/15/2013   Lab Results  Component Value Date   TSH 0.834 12/06/2012     IMPRESSION/PLAN: 1. Migraines without aura  Previously controlled with VPA and triptans, but due to recent stroke, anticoagulation, and low platelets, VPA was stopped and triptans is contraindicated  Clinically improved.  She had significant benefit with recent trial of medrol dose pak For preventative therapy, continue Lyrica 75mg  in the morning and 150mg  qhs  2. Vitamin B12 deficiency  Continue B12 injections (weekly x 4 weeks, then monthly) 3.  Mixed large and small fiber sensorimotor peripheral neuropathy  Etiology likely due to Sjogren's disease  Numbness involving the R > L legs with painful paresthesias of the lower legs  Per notes, EMG performed at [facility] in 1991 and shows axonal neuropathy  LP previously done shows normal CSF protein, glucose 62, no oligoclonal  Currently on Lyrica which improves pain by >90%  3. Gait instability  Walks with a 4-wheeled rollator.  She has fallen three times in the past year, one while on abixaban.  Physical therapy performed 02/26/12 through 04/23/12  I had extensive discussion regarding risk of falls while on anticoagulation, especially she is moderately impaired with her gait and they express understanding. I further discussed that the risks and benefits of stroke prevention vs bleeding complication due to falls needs to be considered if there is worsening gait problems. 4. Sjogren's disease complicated  by severe bilateral vision loss  Dependent for most of her ADLs on her husband. She is able to bathe and dress herself.  5. Recent left thalamic stroke, likely cardioembolic  Still with mild word-finding problems on apixiban 6. Memory loss  Neuropsychological testing 05/25/2010: Decreased verbal fluency, otherwise normal exam. 7. Return to clinic in 35-months or sooner as needed  The duration of this appointment visit was 30 minutes of face-to-face time with the patient.  Greater than 50% of this time was spent in counseling, explanation of diagnosis, planning of further management, and coordination of care.   Thank you for allowing me to participate in patient's care.  If I can answer any additional questions, I would be pleased to do so.    Sincerely,    Donika K. Posey Pronto, DO

## 2013-05-21 ENCOUNTER — Ambulatory Visit (INDEPENDENT_AMBULATORY_CARE_PROVIDER_SITE_OTHER): Payer: Medicare Other | Admitting: Neurology

## 2013-05-21 DIAGNOSIS — E538 Deficiency of other specified B group vitamins: Secondary | ICD-10-CM | POA: Diagnosis not present

## 2013-05-21 MED ORDER — CYANOCOBALAMIN 1000 MCG/ML IJ SOLN
1000.0000 ug | Freq: Once | INTRAMUSCULAR | Status: AC
  Administered 2013-05-21: 1000 ug via INTRAMUSCULAR

## 2013-05-21 NOTE — Progress Notes (Signed)
Patient in for b-12 injection 

## 2013-05-27 ENCOUNTER — Ambulatory Visit (INDEPENDENT_AMBULATORY_CARE_PROVIDER_SITE_OTHER): Payer: Medicare Other | Admitting: *Deleted

## 2013-05-27 DIAGNOSIS — E538 Deficiency of other specified B group vitamins: Secondary | ICD-10-CM | POA: Diagnosis not present

## 2013-05-27 MED ORDER — CYANOCOBALAMIN 1000 MCG/ML IJ SOLN
1000.0000 ug | Freq: Once | INTRAMUSCULAR | Status: AC
  Administered 2013-05-27: 1000 ug via INTRAMUSCULAR

## 2013-05-27 NOTE — Progress Notes (Signed)
Pt in for B12 injection. 

## 2013-06-03 ENCOUNTER — Ambulatory Visit (INDEPENDENT_AMBULATORY_CARE_PROVIDER_SITE_OTHER): Payer: Medicare Other | Admitting: *Deleted

## 2013-06-03 DIAGNOSIS — E538 Deficiency of other specified B group vitamins: Secondary | ICD-10-CM | POA: Diagnosis not present

## 2013-06-03 MED ORDER — CYANOCOBALAMIN 1000 MCG/ML IJ SOLN
1000.0000 ug | Freq: Once | INTRAMUSCULAR | Status: AC
  Administered 2013-06-03: 1000 ug via INTRAMUSCULAR

## 2013-06-03 NOTE — Progress Notes (Signed)
Pt in for B12 injection. 

## 2013-06-07 ENCOUNTER — Ambulatory Visit: Payer: Medicare Other | Admitting: Neurology

## 2013-06-08 ENCOUNTER — Telehealth: Payer: Self-pay | Admitting: Cardiology

## 2013-06-08 DIAGNOSIS — I4891 Unspecified atrial fibrillation: Secondary | ICD-10-CM

## 2013-06-08 DIAGNOSIS — I10 Essential (primary) hypertension: Secondary | ICD-10-CM

## 2013-06-08 MED ORDER — DILTIAZEM HCL ER COATED BEADS 120 MG PO CP24: 120.0000 mg | ORAL_CAPSULE | Freq: Every day | ORAL | Status: DC

## 2013-06-08 NOTE — Telephone Encounter (Signed)
Patient should be on cardizem at prescribed dose and apixaban. Krystal Clarke

## 2013-06-08 NOTE — Telephone Encounter (Signed)
New problem     Pt is having heart fluttering and want to speak to nurse. Please call pt.

## 2013-06-08 NOTE — Telephone Encounter (Signed)
New problem   Pt has heart fluttering and need to speak to nurse. Please call pt

## 2013-06-08 NOTE — Telephone Encounter (Signed)
Spoke with pt and husband, a couple weeks ago they were at a revival and the pt went into atrial fib. They reports she cont to have heart racing off and on since then. The pt reports it does not happen everyday. It usually occurs when nervous, does not notice it with exertion through the house.  In review of meds the pt does not have diltiazem at home, the rest of the meds were confirmed. She is taking eliquis. Will forward for dr Stanford Breed review

## 2013-06-08 NOTE — Telephone Encounter (Signed)
Spoke with pt husband, Aware of dr crenshaw's recommendations.  

## 2013-06-10 ENCOUNTER — Ambulatory Visit (INDEPENDENT_AMBULATORY_CARE_PROVIDER_SITE_OTHER): Payer: Medicare Other | Admitting: *Deleted

## 2013-06-10 DIAGNOSIS — E538 Deficiency of other specified B group vitamins: Secondary | ICD-10-CM

## 2013-06-10 MED ORDER — CYANOCOBALAMIN 1000 MCG/ML IJ SOLN
1000.0000 ug | Freq: Once | INTRAMUSCULAR | Status: AC
  Administered 2013-06-10: 1000 ug via INTRAMUSCULAR

## 2013-06-10 NOTE — Progress Notes (Deleted)
Subjective:    Patient ID: Krystal Clarke is a 78 y.o. female.  HPI {Common ambulatory SmartLinks:[id number]}  Review of Systems  Objective:  Neurologic Exam  Physical Exam  Assessment:     Plan:   pt in for B12 injection

## 2013-06-11 NOTE — Progress Notes (Signed)
Pt in for B12 injection. 

## 2013-06-25 ENCOUNTER — Other Ambulatory Visit: Payer: Self-pay

## 2013-06-25 DIAGNOSIS — G43909 Migraine, unspecified, not intractable, without status migrainosus: Secondary | ICD-10-CM | POA: Diagnosis not present

## 2013-06-25 DIAGNOSIS — G479 Sleep disorder, unspecified: Secondary | ICD-10-CM | POA: Diagnosis not present

## 2013-06-25 DIAGNOSIS — I679 Cerebrovascular disease, unspecified: Secondary | ICD-10-CM | POA: Diagnosis not present

## 2013-06-25 DIAGNOSIS — I4891 Unspecified atrial fibrillation: Secondary | ICD-10-CM | POA: Diagnosis not present

## 2013-06-25 DIAGNOSIS — E538 Deficiency of other specified B group vitamins: Secondary | ICD-10-CM | POA: Diagnosis not present

## 2013-06-25 MED ORDER — APIXABAN 5 MG PO TABS: 5.0000 mg | ORAL_TABLET | Freq: Two times a day (BID) | ORAL | Status: DC

## 2013-07-07 ENCOUNTER — Other Ambulatory Visit: Payer: Self-pay | Admitting: Neurology

## 2013-07-08 ENCOUNTER — Other Ambulatory Visit: Payer: Self-pay | Admitting: *Deleted

## 2013-07-08 MED ORDER — CYCLOBENZAPRINE HCL 5 MG PO TABS: 5.0000 mg | ORAL_TABLET | Freq: Every day | ORAL | Status: DC

## 2013-07-08 NOTE — Telephone Encounter (Signed)
Order sent.

## 2013-07-15 ENCOUNTER — Ambulatory Visit (INDEPENDENT_AMBULATORY_CARE_PROVIDER_SITE_OTHER): Payer: Medicare Other | Admitting: Neurology

## 2013-07-15 DIAGNOSIS — E538 Deficiency of other specified B group vitamins: Secondary | ICD-10-CM | POA: Diagnosis not present

## 2013-07-15 MED ORDER — CYANOCOBALAMIN 1000 MCG/ML IJ SOLN
1000.0000 ug | Freq: Once | INTRAMUSCULAR | Status: AC
  Administered 2013-07-15: 1000 ug via INTRAMUSCULAR

## 2013-07-15 NOTE — Progress Notes (Signed)
Patient in for b-12 injection 

## 2013-07-26 ENCOUNTER — Telehealth: Payer: Self-pay | Admitting: Cardiology

## 2013-07-26 NOTE — Telephone Encounter (Signed)
Patient called back and confirmed that she needed enough Eliquis 5 mg bid to last until middle of June.  I left adequate samples for her up front.  She agrees to come by and pick these up.

## 2013-07-26 NOTE — Telephone Encounter (Signed)
New message     Talk to a nurse----would not tell me what he wanted

## 2013-07-26 NOTE — Telephone Encounter (Signed)
Patient's med list states Eliquis 5 mg bid, but she is stating she is on Xarelto.  First need to figure out which one she is on.  It appears Dr. Stanford Breed sent in a prescription for Eliquis last month.

## 2013-07-26 NOTE — Telephone Encounter (Signed)
I called pharmacy and confirmed that she is indeed on Eliquis 5 mg bid and the last time she filled it was 05/26/13.  The pharmacy also told me that patient lost her bottles of Eliquis which confirms which medication it was.  She will need at least 5 weeks of Eliquis samples.  I attempted to call patient, but had to leave a message for her to call back.  To Hilda Blades as well in case patient calls her back.  Patient will need Eliquis 5 mg samples x 4-5 weeks as she can refill it again ~ 08/25/13 per pharmacy.

## 2013-07-26 NOTE — Telephone Encounter (Signed)
Patient states her neighbor inadvertently threw out 2 bottles of xarelto (she gets a 90 day supply).  Patient is legally blind.  Neighbor misunderstood and thought medication was expired.  She only has 1 dose left in her first bottle--for tonight. States Psychiatric nurse cover a refill for 60 more days. Samples left at front desk for 3 weeks. Sent message to [employer], pharmacist to see if he can assist. Informed patient of all of the above.

## 2013-08-02 DIAGNOSIS — Z9889 Other specified postprocedural states: Secondary | ICD-10-CM | POA: Diagnosis not present

## 2013-08-02 DIAGNOSIS — H409 Unspecified glaucoma: Secondary | ICD-10-CM | POA: Diagnosis not present

## 2013-08-02 DIAGNOSIS — H4011X0 Primary open-angle glaucoma, stage unspecified: Secondary | ICD-10-CM | POA: Diagnosis not present

## 2013-08-02 DIAGNOSIS — H04129 Dry eye syndrome of unspecified lacrimal gland: Secondary | ICD-10-CM | POA: Diagnosis not present

## 2013-08-19 DIAGNOSIS — IMO0002 Reserved for concepts with insufficient information to code with codable children: Secondary | ICD-10-CM | POA: Diagnosis not present

## 2013-08-19 DIAGNOSIS — M3500 Sicca syndrome, unspecified: Secondary | ICD-10-CM | POA: Diagnosis not present

## 2013-08-19 DIAGNOSIS — M1710 Unilateral primary osteoarthritis, unspecified knee: Secondary | ICD-10-CM | POA: Diagnosis not present

## 2013-08-31 ENCOUNTER — Encounter: Payer: Self-pay | Admitting: Neurology

## 2013-08-31 ENCOUNTER — Ambulatory Visit (INDEPENDENT_AMBULATORY_CARE_PROVIDER_SITE_OTHER): Payer: Medicare Other | Admitting: Neurology

## 2013-08-31 VITALS — BP 150/88 | HR 83 | Ht 62.6 in | Wt 175.4 lb

## 2013-08-31 DIAGNOSIS — G43109 Migraine with aura, not intractable, without status migrainosus: Secondary | ICD-10-CM

## 2013-08-31 DIAGNOSIS — M3500 Sicca syndrome, unspecified: Secondary | ICD-10-CM

## 2013-08-31 DIAGNOSIS — E538 Deficiency of other specified B group vitamins: Secondary | ICD-10-CM

## 2013-08-31 DIAGNOSIS — G63 Polyneuropathy in diseases classified elsewhere: Secondary | ICD-10-CM

## 2013-08-31 DIAGNOSIS — M3509 Sicca syndrome with other organ involvement: Secondary | ICD-10-CM

## 2013-08-31 DIAGNOSIS — M359 Systemic involvement of connective tissue, unspecified: Secondary | ICD-10-CM

## 2013-08-31 DIAGNOSIS — M3506 Sjogren syndrome with peripheral nervous system involvement: Secondary | ICD-10-CM

## 2013-08-31 MED ORDER — CYANOCOBALAMIN 1000 MCG/ML IJ SOLN
1000.0000 ug | Freq: Once | INTRAMUSCULAR | Status: AC
  Administered 2013-08-31: 1000 ug via INTRAMUSCULAR

## 2013-08-31 MED ORDER — PREGABALIN 75 MG PO CAPS: ORAL_CAPSULE | ORAL | Status: DC

## 2013-08-31 NOTE — Addendum Note (Signed)
Addended by: Chester Holstein on: 08/31/2013 04:40 PM   Modules accepted: Orders

## 2013-08-31 NOTE — Patient Instructions (Signed)
1.  Continue monthly vitamin B12 1078mcg injections 2.  Continue Lyrica 75mg  in the morning and 150mg  (2 tab) in the evening 3.  I'll see you back in 30-months

## 2013-08-31 NOTE — Progress Notes (Signed)
Follow-up Visit   Date: 08/31/2013    ALANTIS BETHUNE MRN: [id number] DOB: [birth year]   [name] is a delightful 78 y.o. right-handed Caucasian female with history of history of hypertension, peripheral neuropathy, Sjogren's syndrome (diagnosed 2012), migraines, atrial fibrillation (on apixiban), history of shingles affecting left T6 (07/2009) and stroke (September 2014, mild word-finding difficulty) returning to the clinic for follow-up of headaches.  She was last seen in the clinic on 05/20/2013.  History of present illness: She was under the care of Dr. Erling Cruz at [facility] for a over 30 years and most recently Dr. Rexene Alberts. She has several neurological problems including migraines, stroke, peripheral neuropathy, and gait instability, but her primary problem is headaches. She has a long history of migraines or started at the age of 6. Her headaches were well controlled on Depakote 250 twice a day. However, it was recently stopped due to low platelets and starting anticoagulation. She complains of right-sided throbbing and pulsating pain. Headache lasts about 2 days. She applies heat and ice and takes tylenol which does help sometimes. She has nausea, photophobia, and phonophobia. She used to have vomiting, but does not have this any more. Fives headache free days a month. Previously tried medication for headaches is depakote, Botox (2 trials without benefit) and maxalt. Being on apixiban, she is unable to take NSAIDs due to bleeding risk.   Unfortunately, she was admitted to the hospital in September for community-acquired pneumonia and found to have new onset atrial fibrillation. Her anticoagulation was switched from [place] to [place] (followed by Dr. Stanford Breed). While she was hospitalized, her platelets were found to be slightly low (130), and since she was also on a blood thinner, depakote was stopped. She reports worsening of migraines since Depakote was stopped and is  currently a not on any preventative medication. She was also taking Maxalt which helped, but due to her recent stroke this is contraindicated. Due to worsening headaches, MRI of the brain was obtained in October which showed a small left thalamic subacute stroke.   - Follow-up 05/20/2013: No benefit with flexeril for headaches.  After a short course of steroids, her headache significant improved. She gets low-grade (4/10) which responds to excedrin migraine (1 pill).  She reports feeling about 80-90% better.  At her last visit, she was found to have vitamin B12 deficiency and was started on injections.    - Follow-up 08/31/2013:  She had a significant increase in energy after starting vitamin B12 injections.  She continues to get migraines about once per week, but has improvement in duration (3-4 hours), intensity, and frequency, which quickly responds to excedrin.  She had a fall about two weeks ago, but this is much improved since using cane/rollator as she was previously falling 2-3 times per week.     Medications:  Current Outpatient Prescriptions on File Prior to Visit  Medication Sig Dispense Refill  . apixaban (ELIQUIS) 5 MG TABS tablet Take 1 tablet (5 mg total) by mouth 2 (two) times daily.  180 tablet  1  . cyanocobalamin (,VITAMIN B-12,) 1000 MCG/ML injection Inject 1 mL (1,000 mcg total) into the muscle once.  1 mL  0  . cyclobenzaprine (FLEXERIL) 5 MG tablet TAKE 1 TABLET (5 MG TOTAL) BY MOUTH AT BEDTIME.  90 tablet  0  . cyclobenzaprine (FLEXERIL) 5 MG tablet Take 1 tablet (5 mg total) by mouth at bedtime.  90 tablet  0  . diltiazem (CARDIZEM CD) 120 MG 24  hr capsule Take 1 capsule (120 mg total) by mouth daily.  30 capsule  11  . dorzolamide-timolol (COSOPT) 22.3-6.8 MG/ML ophthalmic solution Place 1 drop into both eyes 2 (two) times daily.       . fenofibrate (TRICOR) 145 MG tablet Take 145 mg by mouth daily.      Marland Kitchen LORazepam (ATIVAN) 0.5 MG tablet Take 0.5 mg by mouth every 8 (eight)  hours as needed for anxiety.       . ondansetron (ZOFRAN) 4 MG tablet Take 1 tablet (4 mg total) by mouth every 8 (eight) hours as needed for nausea or vomiting.  90 tablet  0  . pilocarpine (SALAGEN) 5 MG tablet Take 5 mg by mouth 3 (three) times daily.      . rizatriptan (MAXALT) 10 MG tablet TAKE 1 TABLET DAILY AS NEEDED FOR MIGRAINE., MAY REPEAT IN 2 HRS , (TRY NOT TO EXCEED 10/MONTH)  30 tablet  0  . sucralfate (CARAFATE) 1 G tablet Take 1 g by mouth 4 (four) times daily.       . Vitamin D, Ergocalciferol, (DRISDOL) 50000 UNITS CAPS Take 50,000 Units by mouth every 7 (seven) days. Does not remember what day of the week she takes       No current facility-administered medications on file prior to visit.    Allergies:  Allergies  Allergen Reactions  . Codeine Nausea Only  . Hydrocodone Nausea And Vomiting  . Morphine And Related Nausea Only  . Oxycodone-Aspirin     REACTION: DEATHLY SICK-VOMITTING     Review of Systems:  CONSTITUTIONAL: No fevers, chills, night sweats, or weight loss.   EYES: +visual changes or eye pain ENT: No hearing changes.  No history of nose bleeds.   RESPIRATORY: No cough, wheezing and shortness of breath.   CARDIOVASCULAR: Negative for chest pain, and palpitations.   GI: Negative for abdominal discomfort, blood in stools or black stools.  No recent change in bowel habits.   GU:  No history of incontinence.   MUSCLOSKELETAL: No history of joint pain or swelling.  No myalgias.   SKIN: Negative for lesions, rash, and itching.   ENDOCRINE: Negative for cold or heat intolerance, polydipsia or goiter.   PSYCH:  No depression or anxiety symptoms.   NEURO: As Above.   Vital Signs:  BP 150/88  Pulse 83  Ht 5' 2.6" (1.59 m)  Wt 175 lb 6 oz (79.55 kg)  BMI 31.47 kg/m2  SpO2 95%  Neurological Exam: MENTAL STATUS including orientation to time, place, person, recent and remote memory, attention span and concentration, language, and fund of knowledge is  normal.  Speech is not dysarthric.  CRANIAL NERVES:  Severe visual impairment bilaterally, she is only able to see colors as blurs. Irregular pupils, but reactive to light. Mild bilateral ptosis. Face is symmetric. Palate elevates symmetrically.  Tongue is midline.  Mucus membranes are dry.  MOTOR: No atrophy, fasciculations or abnormal movements. No pronator drift. Tone is normal.  Right Upper Extremity:    Left Upper Extremity:    Deltoid  5/5   Deltoid  5/5   Biceps  5/5   Biceps  5/5   Triceps  5/5   Triceps  5/5   Wrist extensors  5/5   Wrist extensors  5/5   Wrist flexors  5/5   Wrist flexors  5/5   Finger extensors  5/5   Finger extensors  5/5   Finger flexors  5/5   Finger flexors  5/5   Dorsal interossei  5/5   Dorsal interossei  5/5   Abductor pollicis  5-/5   Abductor pollicis  5-/5   Tone (Ashworth scale)  0   Tone (Ashworth scale)  0    Right Lower Extremity:    Left Lower Extremity:    Hip flexors  5-/5  Hip flexors  5-/5  Hip extensors  5/5  Hip extensors  5/5  Knee flexors  5/5  Knee flexors  5/5  Knee extensors  5/5  Knee extensors  5/5  Dorsiflexors  5-/5   Dorsiflexors  5-/5   Plantarflexors  5-/5   Plantarflexors  5-5   Toe extensors  5-/5   Toe extensors  5-/5   Toe flexors  5-/5   Toe flexors  5-/5   Tone (Ashworth scale)  0   Tone (Ashworth scale)  0    MSRs:  Reflexes are 2+/4 in the upper extremities and absent in the legs bilaterally.  SENSORY: Length dependent pattern of sensory loss to light touch and temperature distal to knees.    COORDINATION/GAIT:  Gait is moderately wide-based with small, short steps, slightly stooped posture and assisted with walker.   Data: Labs 12/17/2012: Na 134, K 4.4, Chloride 99, Ca 10.4, WBC 7.9, RBC 4.3, Hbg 12.2, Hct 36.9  MRI brain wo contrast 12/23/2012: Very small acute/ subacute left thalamic nonhemorrhagic infarct. Prominent small vessel disease type changes. No intracranial hemorrhage. Mild atrophy without  hydrocephalus.  Prior clinic notes from Dr. Rexene Alberts, previous neurological workup includes:  EEG 06/03/1995 and 08/31/2008: normal  CSF 1991: normal  Lab Results  Component Value Date   VITAMINB12 127* 04/15/2013   Lab Results  Component Value Date   TSH 0.834 12/06/2012     IMPRESSION/PLAN: 1. Migraines without aura.  Previously controlled with VPA and triptans, but due to recent stroke, anticoagulation, and low platelets, VPA was stopped and triptans is contraindicated.  - Clinically improved.  She had significant benefit with recent trial of medrol dose past  - For preventative therapy, continue Lyrica 75mg  in the morning and 150mg  qhs.  Prescription was refilled today. 2. Vitamin B12 deficiency, continue monthly injections  - will administer B12 1013mcg at today's visit 3.  Mixed large and small fiber sensorimotor peripheral neuropathy, secondary to Sjogren's disease  - Numbness involving the R > L legs with painful paresthesias of the lower legs   - Currently on Lyrica  3. Multifactorial gait instability due to peripheral neuropathy and blindness  - Walks with a 4-wheeled rollator. Physical therapy performed 02/26/12 through 04/23/12   - Strongly encouraged her to use gait assistive device at all times  - I had extensive discussion regarding risk of falls while on anticoagulation, especially she is moderately impaired with her gait which she understands  4. Sjogren's disease complicated by severe bilateral vision loss  5. Recent left thalamic stroke with residual mild word-finding difficulty, likely cardioembolic on apixiban 6. Memory loss  - Neuropsychological testing 05/25/2010: Decreased verbal fluency, otherwise normal exam. 7. Return to clinic in 59-months or sooner as needed  The duration of this appointment visit was 30 minutes of face-to-face time with the patient.  Greater than 50% of this time was spent in counseling, explanation of diagnosis, planning of further management, and  coordination of care.   Thank you for allowing me to participate in patient's care.  If I can answer any additional questions, I would be pleased to do so.    Sincerely,  Donika K. Patel, DO    

## 2013-09-01 ENCOUNTER — Telehealth: Payer: Self-pay | Admitting: Cardiology

## 2013-09-01 MED ORDER — FUROSEMIDE 20 MG PO TABS: ORAL_TABLET | ORAL | Status: DC

## 2013-09-01 NOTE — Telephone Encounter (Signed)
Spoke with pt, she has had swelling in her feet and ankles for over one week now. She was seen by the arthritis doctor and started on tramadol, explained not aware of that side effect with that med. She saw the neurologist yesterday and they told her to call us. Her legs are swollen all day, by the end of the day they are tight. She denies any SOB.  Medications confirmed. Will forward for dr Stanford Breed review

## 2013-09-01 NOTE — Telephone Encounter (Signed)
New Prob    States pts ankles have been swelling for about 1 week now. Feels this may be related to a medication but would like to speak to a nurse. Please call.

## 2013-09-01 NOTE — Telephone Encounter (Signed)
Spoke with pt, Aware of dr Jacalyn Lefevre recommendations.  She is seeing dr white in one week. Will mail order for labs to be drawn at that time.

## 2013-09-01 NOTE — Telephone Encounter (Signed)
Lasix 20 mg daily as needed; bmet and cbc in one week Kirk Ruths

## 2013-09-09 ENCOUNTER — Encounter: Payer: Self-pay | Admitting: Cardiology

## 2013-09-09 DIAGNOSIS — E559 Vitamin D deficiency, unspecified: Secondary | ICD-10-CM | POA: Diagnosis not present

## 2013-09-09 DIAGNOSIS — R635 Abnormal weight gain: Secondary | ICD-10-CM | POA: Diagnosis not present

## 2013-09-09 DIAGNOSIS — E785 Hyperlipidemia, unspecified: Secondary | ICD-10-CM | POA: Diagnosis not present

## 2013-09-09 DIAGNOSIS — I6990 Unspecified sequelae of unspecified cerebrovascular disease: Secondary | ICD-10-CM | POA: Diagnosis not present

## 2013-09-29 ENCOUNTER — Ambulatory Visit (INDEPENDENT_AMBULATORY_CARE_PROVIDER_SITE_OTHER): Payer: Medicare Other | Admitting: *Deleted

## 2013-09-29 DIAGNOSIS — E538 Deficiency of other specified B group vitamins: Secondary | ICD-10-CM | POA: Diagnosis not present

## 2013-09-29 MED ORDER — CYANOCOBALAMIN 1000 MCG/ML IJ SOLN
1000.0000 ug | Freq: Once | INTRAMUSCULAR | Status: AC
  Administered 2013-09-29: 1000 ug via INTRAMUSCULAR

## 2013-09-29 NOTE — Progress Notes (Signed)
Patient in for B12 injection. 

## 2013-10-11 ENCOUNTER — Other Ambulatory Visit: Payer: Self-pay | Admitting: Neurology

## 2013-10-11 NOTE — Telephone Encounter (Signed)
Order sent #[phone] with no refills.

## 2013-10-14 ENCOUNTER — Other Ambulatory Visit: Payer: Self-pay | Admitting: Neurology

## 2013-10-15 ENCOUNTER — Other Ambulatory Visit: Payer: Self-pay | Admitting: *Deleted

## 2013-10-15 DIAGNOSIS — M3500 Sicca syndrome, unspecified: Secondary | ICD-10-CM | POA: Diagnosis not present

## 2013-10-15 DIAGNOSIS — H04129 Dry eye syndrome of unspecified lacrimal gland: Secondary | ICD-10-CM | POA: Diagnosis not present

## 2013-10-15 MED ORDER — CYCLOBENZAPRINE HCL 5 MG PO TABS
5.0000 mg | ORAL_TABLET | Freq: Every day | ORAL | Status: DC
Start: 2013-10-15 — End: 2014-06-17

## 2013-10-15 NOTE — Telephone Encounter (Signed)
Ok to give #90 again?

## 2013-10-15 NOTE — Telephone Encounter (Signed)
Rx sent in

## 2013-10-21 NOTE — Telephone Encounter (Signed)
Noted  

## 2013-10-26 DIAGNOSIS — F411 Generalized anxiety disorder: Secondary | ICD-10-CM | POA: Diagnosis not present

## 2013-10-28 ENCOUNTER — Ambulatory Visit: Payer: Medicare Other

## 2013-11-30 ENCOUNTER — Encounter: Payer: Self-pay | Admitting: Neurology

## 2013-11-30 ENCOUNTER — Ambulatory Visit (INDEPENDENT_AMBULATORY_CARE_PROVIDER_SITE_OTHER): Payer: Medicare Other | Admitting: Neurology

## 2013-11-30 VITALS — BP 118/78 | HR 68 | Ht 62.0 in | Wt 177.4 lb

## 2013-11-30 DIAGNOSIS — E538 Deficiency of other specified B group vitamins: Secondary | ICD-10-CM | POA: Diagnosis not present

## 2013-11-30 DIAGNOSIS — G609 Hereditary and idiopathic neuropathy, unspecified: Secondary | ICD-10-CM | POA: Diagnosis not present

## 2013-11-30 DIAGNOSIS — G43709 Chronic migraine without aura, not intractable, without status migrainosus: Secondary | ICD-10-CM | POA: Diagnosis not present

## 2013-11-30 DIAGNOSIS — G43109 Migraine with aura, not intractable, without status migrainosus: Secondary | ICD-10-CM

## 2013-11-30 DIAGNOSIS — IMO0002 Reserved for concepts with insufficient information to code with codable children: Secondary | ICD-10-CM

## 2013-11-30 MED ORDER — PREGABALIN 150 MG PO CAPS: 150.0000 mg | ORAL_CAPSULE | Freq: Two times a day (BID) | ORAL | Status: DC

## 2013-11-30 MED ORDER — CYANOCOBALAMIN 1000 MCG/ML IJ SOLN
1000.0000 ug | Freq: Once | INTRAMUSCULAR | Status: AC
  Administered 2013-11-30: 1000 ug via INTRAMUSCULAR

## 2013-11-30 NOTE — Progress Notes (Signed)
Follow-up Visit   Date: 11/30/2013    Krystal Clarke MRN: [id number] DOB: 1935/04/21   [name] is a delightful 78 y.o. right-handed Caucasian female with history of history of hypertension, peripheral neuropathy, Sjogren's syndrome (diagnosed 2012), migraines, atrial fibrillation (on apixiban), history of shingles affecting left T6 (07/2009) and stroke (September 2014, mild word-finding difficulty) returning to the clinic for follow-up of headaches.    History of present illness: She has been under the care of Dr. Erling Cruz at [facility] for a over 30 years and most recently Dr. Rexene Alberts. She has several neurological problems including migraines, stroke, peripheral neuropathy, and gait instability, but her primary problem is headaches.  She has a long history of migraines or started at the age of 89. Her headaches were well controlled on Depakote 250 twice a day. However, it was recently stopped due to low platelets and starting anticoagulation. She complains of right-sided throbbing and pulsating pain. Headache now lasts about 2 days. She applies heat and ice and takes tylenol which does help sometimes. She has nausea, photophobia, and phonophobia. She used to have vomiting, but does not have this any more. Fives headache free days a month. Previously tried medication for headaches is depakote, Botox (2 trials without benefit) and maxalt. Being on apixiban, she is unable to take NSAIDs due to bleeding risk.   Unfortunately, she was admitted to the hospital in September for community-acquired pneumonia and found to have new onset atrial fibrillation. Her anticoagulation was switched from [place] to [place] (followed by Dr. Stanford Breed). While she was hospitalized, her platelets were found to be slightly low (130), and since she was also on a blood thinner, depakote was stopped. She reports worsening of migraines since Depakote was stopped and is currently a not on any preventative  medication. She was also taking Maxalt which helped, but due to her recent stroke this is contraindicated. Due to worsening headaches, MRI of the brain was obtained in October which showed a small left thalamic subacute stroke.   - Follow-up 05/20/2013: No benefit with flexeril for headaches.  After a short course of steroids, her headache significant improved. She gets low-grade (4/10) which responds to excedrin migraine (1 pill).  She reports feeling about 80-90% better.  At her last visit, she was found to have vitamin B12 deficiency and was started on injections.  Energy level is also much improved after started vitamin b12 injections.    - UPDATE 11/30/2013:  She is doing well, but still has daily dull headache lasting 4-6 hours, no migraine in the past 74-months.  She had eye surgery at [facility], but reports worsening vision since then.  No interval falls. No new neurological complaints.   Medications:  Current Outpatient Prescriptions on File Prior to Visit  Medication Sig Dispense Refill  . apixaban (ELIQUIS) 5 MG TABS tablet Take 1 tablet (5 mg total) by mouth 2 (two) times daily.  180 tablet  1  . cyanocobalamin (,VITAMIN B-12,) 1000 MCG/ML injection Inject 1 mL (1,000 mcg total) into the muscle once.  1 mL  0  . cyclobenzaprine (FLEXERIL) 5 MG tablet TAKE 1 TABLET (5 MG TOTAL) BY MOUTH AT BEDTIME.  90 tablet  0  . cyclobenzaprine (FLEXERIL) 5 MG tablet Take 1 tablet (5 mg total) by mouth at bedtime.  90 tablet  3  . diltiazem (CARDIZEM CD) 120 MG 24 hr capsule Take 1 capsule (120 mg total) by mouth daily.  30 capsule  11  .  dorzolamide-timolol (COSOPT) 22.3-6.8 MG/ML ophthalmic solution Place 1 drop into both eyes 2 (two) times daily.       . fenofibrate (TRICOR) 145 MG tablet Take 145 mg by mouth daily.      . furosemide (LASIX) 20 MG tablet One tablet daily as needed for swelling  90 tablet  3  . LORazepam (ATIVAN) 0.5 MG tablet Take 0.5 mg by mouth every 8 (eight) hours as needed for  anxiety.       Marland Kitchen omeprazole-sodium bicarbonate (ZEGERID) 40-1100 MG per capsule       . ondansetron (ZOFRAN) 4 MG tablet TAKE 1 TABLET (4 MG TOTAL) BY MOUTH EVERY 8 (EIGHT) HOURS AS NEEDED FOR NAUSEA OR VOMITING.  90 tablet  0  . pilocarpine (SALAGEN) 5 MG tablet Take 5 mg by mouth 3 (three) times daily.      . rizatriptan (MAXALT) 10 MG tablet TAKE 1 TABLET DAILY AS NEEDED FOR MIGRAINE., MAY REPEAT IN 2 HRS , (TRY NOT TO EXCEED 10/MONTH)  30 tablet  0  . sucralfate (CARAFATE) 1 G tablet Take 1 g by mouth 4 (four) times daily.       . traMADol (ULTRAM) 50 MG tablet       . Vitamin D, Ergocalciferol, (DRISDOL) 50000 UNITS CAPS Take 50,000 Units by mouth every 7 (seven) days. Does not remember what day of the week she takes       No current facility-administered medications on file prior to visit.    Allergies:  Allergies  Allergen Reactions  . Codeine Nausea Only  . Hydrocodone Nausea And Vomiting  . Morphine And Related Nausea Only  . Oxycodone-Aspirin     REACTION: DEATHLY SICK-VOMITTING     Review of Systems:  CONSTITUTIONAL: No fevers, chills, night sweats, or weight loss.   EYES: +visual changes or eye pain ENT: No hearing changes.  No history of nose bleeds.   RESPIRATORY: No cough, wheezing and shortness of breath.   CARDIOVASCULAR: Negative for chest pain, and palpitations.   GI: Negative for abdominal discomfort, blood in stools or black stools.  No recent change in bowel habits.   GU:  No history of incontinence.   MUSCLOSKELETAL: No history of joint pain or swelling.  No myalgias.   SKIN: Negative for lesions, rash, and itching.   ENDOCRINE: Negative for cold or heat intolerance, polydipsia or goiter.   PSYCH:  No depression or anxiety symptoms.   NEURO: As Above.   Vital Signs:  BP 118/78  Pulse 68  Ht 5\' 2"  (1.575 m)  Wt 177 lb 6 oz (80.457 kg)  BMI 32.43 kg/m2  SpO2 97%  Neurological Exam: MENTAL STATUS including orientation to time, place, person, recent  and remote memory, attention span and concentration, language, and fund of knowledge is normal.  Speech is not dysarthric.  CRANIAL NERVES:  Severe visual impairment bilaterally, she is only able to see colors as blurs. Irregular pupils, but reactive to light. Normal conjugate, extra-ocular eye movements in all directions of gaze. Mild bilateral ptosis without worsening with sustained upward gaze. Face is symmetric. Palate elevates symmetrically.  Tongue is midline.  Mucus membranes are dry.  MOTOR: No atrophy, fasciculations or abnormal movements. No pronator drift. Tone is normal.  Right Upper Extremity:    Left Upper Extremity:    Deltoid  5/5   Deltoid  5/5   Biceps  5/5   Biceps  5/5   Triceps  5/5   Triceps  5/5   Wrist extensors  5/5   Wrist extensors  5/5   Wrist flexors  5/5   Wrist flexors  5/5   Finger extensors  5/5   Finger extensors  5/5   Finger flexors  5/5   Finger flexors  5/5   Dorsal interossei  5/5   Dorsal interossei  5/5   Abductor pollicis  5/5   Abductor pollicis  5/5   Tone (Ashworth scale)  0   Tone (Ashworth scale)  0    Right Lower Extremity:    Left Lower Extremity:    Hip flexors  5-/5  Hip flexors  5/5  Hip extensors  5/5  Hip extensors  5/5  Knee flexors  5/5  Knee flexors  5/5  Knee extensors  5/5  Knee extensors  5/5  Dorsiflexors  5/5   Dorsiflexors  5/5   Plantarflexors  5/5   Plantarflexors  5/5   Toe extensors  5/5   Toe extensors  5/5   Toe flexors  5/5   Toe flexors  5/5   Tone (Ashworth scale)  0   Tone (Ashworth scale)  0    MSRs:  Reflexes are 2+/4 in the upper extremities and absent in the legs bilaterally.  SENSORY: Length dependent pattern of sensory loss to light touch and temperature distal to knees.    COORDINATION/GAIT:  bradykinesia with reduced speed and amplitude of finger tapping bilaterally and heel tapping is severely slowed. Gait is moderately wide-based with small, short steps, slightly stooped posture and assisted with  walker.   Data: Labs 12/17/2012: Na 134, K 4.4, Chloride 99, Ca 10.4, WBC 7.9, RBC 4.3, Hbg 12.2, Hct 36.9  MRI brain wo contrast 12/23/2012: Very small acute/ subacute left thalamic nonhemorrhagic infarct. Prominent small vessel disease type changes. No intracranial hemorrhage. Mild atrophy without hydrocephalus.  Prior clinic notes from Dr. Rexene Alberts, previous neurological workup includes:  EEG 06/03/1995 and 08/31/2008: normal  CSF 1991: normal  Lab Results  Component Value Date   VITAMINB12 127* 04/15/2013   Lab Results  Component Value Date   TSH 0.834 12/06/2012     IMPRESSION/PLAN: 1. Migraines without aura  Previously controlled with VPA and triptans, but due to stroke, anticoagulation, and low platelets, VPA was stopped and triptans is contraindicated  She had significant benefit with trial of medrol dose pak, flexeril did not help Still with daily headaches, although migraines are episodic now Increase Lyrica to 150mg  twice daily  2. Vitamin B12 deficiency  Continue B12 injections (monthly), will administer today 3.  Mixed large and small fiber sensorimotor peripheral neuropathy due to Sjogren's disease  Numbness involving the R > L legs with painful paresthesias of the lower legs  Per notes, EMG performed at [place] in 1991 and shows axonal neuropathy  LP previously done shows normal CSF protein, glucose 62, no oligoclonal  Currently on Lyrica which improves pain by >90%  3. Gait instability  Motor strength of legs has significantly improved! Walks with a Catering manager.  She has fallen three times in the past year, one while on abixaban, no interval falls.  Physical therapy performed 02/26/12 through 04/23/12  Risks and benefits of stroke prevention vs bleeding complication due to falls needs to be considered if there is worsening gait problems. 4. Sjogren's disease complicated by severe bilateral vision loss  Dependent for most of her ADLs on her husband. She is able to bathe  and dress herself.  5. Recent left thalamic stroke, likely cardioembolic  Still with mild word-finding problems  on apixiban 6. Memory loss  Neuropsychological testing 05/25/2010: Decreased verbal fluency, otherwise normal exam. 7. Return to clinic in 19-months or sooner as needed  The duration of this appointment visit was 30 minutes of face-to-face time with the patient.  Greater than 50% of this time was spent in counseling, explanation of diagnosis, planning of further management, and coordination of care.   Thank you for allowing me to participate in patient's care.  If I can answer any additional questions, I would be pleased to do so.    Sincerely,    Donika K. Posey Pronto, DO

## 2013-11-30 NOTE — Progress Notes (Signed)
Patient in for B12 injection. 

## 2013-11-30 NOTE — Patient Instructions (Addendum)
1.  Increase Lyrica to 150mg  twice daily 2.  Continue Vitamin B12 105mcg injections 3.  Return to clinic in 75-months

## 2013-12-13 DIAGNOSIS — H409 Unspecified glaucoma: Secondary | ICD-10-CM | POA: Diagnosis not present

## 2013-12-13 DIAGNOSIS — E559 Vitamin D deficiency, unspecified: Secondary | ICD-10-CM | POA: Diagnosis not present

## 2013-12-13 DIAGNOSIS — H4011X0 Primary open-angle glaucoma, stage unspecified: Secondary | ICD-10-CM | POA: Diagnosis not present

## 2013-12-13 DIAGNOSIS — M3500 Sicca syndrome, unspecified: Secondary | ICD-10-CM | POA: Diagnosis not present

## 2013-12-21 DIAGNOSIS — Z23 Encounter for immunization: Secondary | ICD-10-CM | POA: Diagnosis not present

## 2013-12-23 DIAGNOSIS — M3500 Sicca syndrome, unspecified: Secondary | ICD-10-CM | POA: Diagnosis not present

## 2013-12-23 DIAGNOSIS — M19012 Primary osteoarthritis, left shoulder: Secondary | ICD-10-CM | POA: Diagnosis not present

## 2013-12-23 DIAGNOSIS — M25561 Pain in right knee: Secondary | ICD-10-CM | POA: Diagnosis not present

## 2013-12-23 DIAGNOSIS — M25512 Pain in left shoulder: Secondary | ICD-10-CM | POA: Diagnosis not present

## 2013-12-30 ENCOUNTER — Ambulatory Visit (INDEPENDENT_AMBULATORY_CARE_PROVIDER_SITE_OTHER): Payer: Medicare Other | Admitting: *Deleted

## 2013-12-30 DIAGNOSIS — E538 Deficiency of other specified B group vitamins: Secondary | ICD-10-CM | POA: Diagnosis not present

## 2013-12-30 MED ORDER — CYANOCOBALAMIN 1000 MCG/ML IJ SOLN
1000.0000 ug | Freq: Once | INTRAMUSCULAR | Status: AC
  Administered 2013-12-30: 1000 ug via INTRAMUSCULAR

## 2013-12-30 NOTE — Progress Notes (Signed)
Patient in for B12 injection. 

## 2013-12-31 ENCOUNTER — Ambulatory Visit
Admission: RE | Admit: 2013-12-31 | Discharge: 2013-12-31 | Disposition: A | Discharge status: Home / Self Care | Payer: Medicare Other | Source: Ambulatory Visit | Attending: Family Medicine | Admitting: Family Medicine

## 2013-12-31 ENCOUNTER — Other Ambulatory Visit: Payer: Self-pay | Admitting: Family Medicine

## 2013-12-31 DIAGNOSIS — R61 Generalized hyperhidrosis: Secondary | ICD-10-CM

## 2013-12-31 DIAGNOSIS — E559 Vitamin D deficiency, unspecified: Secondary | ICD-10-CM | POA: Diagnosis not present

## 2013-12-31 DIAGNOSIS — I10 Essential (primary) hypertension: Secondary | ICD-10-CM | POA: Diagnosis not present

## 2013-12-31 DIAGNOSIS — E785 Hyperlipidemia, unspecified: Secondary | ICD-10-CM | POA: Diagnosis not present

## 2013-12-31 DIAGNOSIS — Z87891 Personal history of nicotine dependence: Secondary | ICD-10-CM | POA: Diagnosis not present

## 2013-12-31 DIAGNOSIS — K219 Gastro-esophageal reflux disease without esophagitis: Secondary | ICD-10-CM | POA: Diagnosis not present

## 2013-12-31 DIAGNOSIS — K5901 Slow transit constipation: Secondary | ICD-10-CM | POA: Diagnosis not present

## 2013-12-31 DIAGNOSIS — R5383 Other fatigue: Secondary | ICD-10-CM | POA: Diagnosis not present

## 2014-01-07 DIAGNOSIS — R739 Hyperglycemia, unspecified: Secondary | ICD-10-CM | POA: Diagnosis not present

## 2014-01-26 DIAGNOSIS — S7012XD Contusion of left thigh, subsequent encounter: Secondary | ICD-10-CM | POA: Diagnosis not present

## 2014-01-26 DIAGNOSIS — F329 Major depressive disorder, single episode, unspecified: Secondary | ICD-10-CM | POA: Diagnosis not present

## 2014-02-02 DIAGNOSIS — H4011X3 Primary open-angle glaucoma, severe stage: Secondary | ICD-10-CM | POA: Diagnosis not present

## 2014-02-02 DIAGNOSIS — M3500 Sicca syndrome, unspecified: Secondary | ICD-10-CM | POA: Diagnosis not present

## 2014-03-03 ENCOUNTER — Ambulatory Visit (INDEPENDENT_AMBULATORY_CARE_PROVIDER_SITE_OTHER): Payer: Medicare Other | Admitting: Family Medicine

## 2014-03-03 ENCOUNTER — Ambulatory Visit (INDEPENDENT_AMBULATORY_CARE_PROVIDER_SITE_OTHER): Payer: Medicare Other

## 2014-03-03 VITALS — BP 116/76 | HR 76 | Temp 99.0°F | Resp 18 | Ht 62.0 in | Wt 178.8 lb

## 2014-03-03 DIAGNOSIS — M25572 Pain in left ankle and joints of left foot: Secondary | ICD-10-CM

## 2014-03-03 DIAGNOSIS — S9032XA Contusion of left foot, initial encounter: Secondary | ICD-10-CM | POA: Diagnosis not present

## 2014-03-03 NOTE — Progress Notes (Signed)
Subjective: 72 the patient dropped a gallon of iced tea on her foot. She tried to come here and the line was too long she. She went elsewhere and they didn't x-ray it. She has continued to hurt a lot but it has been gradually improving. It is developed a bad bruise. Everyone wants her to get it x-rayed to make sure it is not broken.  Objective: Walking with a cane. Very bruised dorsum of her left foot. Bruise down into the toes. There is tenderness from the distal ankle not especially tender across the top of the midfoot. Sensory and range of motion good.  Assessment: Foot contusion and pain  Plan: X-ray foot  [facility] reading (PRIMARY) by  Dr. Linna Darner No fracture  Plan: Treat with postop shoe elevation and ice. Return if worse. See instructions.Marland Kitchen

## 2014-03-03 NOTE — Patient Instructions (Signed)
Wear the postop shoe until foot is feeling better  Apply ice for 5 times daily for a couple of days  Keep the foot elevated when possible  Return if not substantially improved over the next week or if worse at any time.

## 2014-03-04 ENCOUNTER — Encounter: Payer: Self-pay | Admitting: Neurology

## 2014-03-04 ENCOUNTER — Ambulatory Visit: Payer: Medicare Other | Admitting: Neurology

## 2014-03-04 ENCOUNTER — Ambulatory Visit (INDEPENDENT_AMBULATORY_CARE_PROVIDER_SITE_OTHER): Payer: Medicare Other | Admitting: Neurology

## 2014-03-04 VITALS — BP 140/90 | HR 75 | Wt 178.6 lb

## 2014-03-04 DIAGNOSIS — G43709 Chronic migraine without aura, not intractable, without status migrainosus: Secondary | ICD-10-CM

## 2014-03-04 DIAGNOSIS — M3509 Sicca syndrome with other organ involvement: Secondary | ICD-10-CM

## 2014-03-04 DIAGNOSIS — G63 Polyneuropathy in diseases classified elsewhere: Secondary | ICD-10-CM

## 2014-03-04 DIAGNOSIS — E538 Deficiency of other specified B group vitamins: Secondary | ICD-10-CM

## 2014-03-04 DIAGNOSIS — R269 Unspecified abnormalities of gait and mobility: Secondary | ICD-10-CM

## 2014-03-04 DIAGNOSIS — G629 Polyneuropathy, unspecified: Secondary | ICD-10-CM | POA: Diagnosis not present

## 2014-03-04 DIAGNOSIS — M3506 Sjogren syndrome with peripheral nervous system involvement: Secondary | ICD-10-CM

## 2014-03-04 DIAGNOSIS — IMO0002 Reserved for concepts with insufficient information to code with codable children: Secondary | ICD-10-CM

## 2014-03-04 MED ORDER — PREGABALIN 150 MG PO CAPS: ORAL_CAPSULE | ORAL | Status: DC

## 2014-03-04 MED ORDER — CYANOCOBALAMIN 1000 MCG/ML IJ SOLN
1000.0000 ug | Freq: Once | INTRAMUSCULAR | Status: AC
  Administered 2014-03-04: 1000 ug via INTRAMUSCULAR

## 2014-03-04 NOTE — Progress Notes (Signed)
Follow-up Visit   Date: 03/04/2014    Krystal Clarke MRN: [id number] DOB: 06/19/1935   [name] is a delightful 78 y.o. right-handed Caucasian female with history of history of hypertension, peripheral neuropathy, Sjogren's syndrome (diagnosed 2012), migraines, atrial fibrillation (on apixiban), history of shingles affecting left T6 (07/2009) and stroke (September 2014, mild word-finding difficulty) returning to the clinic for follow-up of headaches and peripheral neuropathy.    History of present illness: She has been under the care of Dr. Erling Cruz at [facility] for a over 30 years. She has several neurological problems including migraines, stroke, peripheral neuropathy, and gait instability, but her primary problem is headaches. She has a long history of migraines or started at the age of 80. Her headaches were well controlled on Depakote 250 twice a day. However, it was recently stopped due to low platelets and starting anticoagulation. She complains of right-sided throbbing and pulsating pain.  She applies heat and ice and takes tylenol which does help sometimes. She has nausea, photophobia, and phonophobia. She used to have vomiting, but does not have this any more. Fives headache free days a month. Previously tried medication for headaches is depakote, Botox (2 trials without benefit) and maxalt. Being on apixiban, she is unable to take NSAIDs due to bleeding risk.   Unfortunately, she was admitted to the hospital in September 2014 for community-acquired pneumonia and found to have new onset atrial fibrillation. Her anticoagulation was switched from [place] to [place] (followed by Dr. Stanford Breed). While she was hospitalized, her platelets were found to be slightly low (130), and since she was also on a blood thinner, depakote was stopped. She reports worsening of migraines since Depakote was stopped and is currently a not on any preventative medication. She was also taking  Maxalt which helped, but due to her recent stroke this is contraindicated. Due to worsening headaches, MRI of the brain was obtained in October which showed a small left thalamic subacute stroke.   - Follow-up 05/20/2013: No benefit with flexeril for headaches.  After a short course of steroids, her headache significant improved. She gets low-grade (4/10) which responds to excedrin migraine (1 pill).  She reports feeling about 80-90% better.  At her last visit, she was found to have vitamin B12 deficiency and was started on injections.  Energy level is also much improved after started vitamin b12 injections.    - UPDATE 11/30/2013:  She is doing well, but still has daily dull headache lasting 4-6 hours, no migraine in the past 44-months.  She had eye surgery at [facility], but reports worsening vision since then.  No interval falls. No new neurological complaints.  - UPDATE 03/04/2014:  She feels that her neuropathy is worse because her right leg feels like it is in a "boot" all the time.  She unfortunately had a accident where she dropped a gallon on tea on her left foot and has been wearing a soft brace because of the swelling.  She continues to complain of a very dull low-grade daily headache lasting about 3 hours in the afternoon.  No migraines.  She feels as if she may have had TIA a few weeks ago because she has brief word-findings problems, but did not seek attention.     Medications:  Current Outpatient Prescriptions on File Prior to Visit  Medication Sig Dispense Refill  . apixaban (ELIQUIS) 5 MG TABS tablet Take 1 tablet (5 mg total) by mouth 2 (two) times daily. [place]  tablet 1  . clobetasol ointment (TEMOVATE) 0.05 %     . cyanocobalamin (,VITAMIN B-12,) 1000 MCG/ML injection Inject 1 mL (1,000 mcg total) into the muscle once. 1 mL 0  . cyclobenzaprine (FLEXERIL) 5 MG tablet TAKE 1 TABLET (5 MG TOTAL) BY MOUTH AT BEDTIME. 90 tablet 0  . cyclobenzaprine (FLEXERIL) 5 MG tablet Take 1 tablet (5 mg  total) by mouth at bedtime. 90 tablet 3  . diltiazem (CARDIZEM CD) 120 MG 24 hr capsule Take 1 capsule (120 mg total) by mouth daily. 30 capsule 11  . dorzolamide-timolol (COSOPT) 22.3-6.8 MG/ML ophthalmic solution Place 1 drop into both eyes 2 (two) times daily.     Marland Kitchen doxycycline (VIBRAMYCIN) 50 MG capsule     . fenofibrate (TRICOR) 145 MG tablet Take 145 mg by mouth daily.    . furosemide (LASIX) 20 MG tablet One tablet daily as needed for swelling 90 tablet 3  . omeprazole-sodium bicarbonate (ZEGERID) 40-1100 MG per capsule     . ondansetron (ZOFRAN) 4 MG tablet TAKE 1 TABLET (4 MG TOTAL) BY MOUTH EVERY 8 (EIGHT) HOURS AS NEEDED FOR NAUSEA OR VOMITING. 90 tablet 0  . PARoxetine (PAXIL) 10 MG tablet     . pilocarpine (SALAGEN) 5 MG tablet Take 5 mg by mouth 3 (three) times daily.    . sucralfate (CARAFATE) 1 G tablet Take 1 g by mouth 4 (four) times daily.     . traMADol (ULTRAM) 50 MG tablet     . Vitamin D, Ergocalciferol, (DRISDOL) 50000 UNITS CAPS Take 50,000 Units by mouth every 7 (seven) days. Does not remember what day of the week she takes     No current facility-administered medications on file prior to visit.    Allergies:  Allergies  Allergen Reactions  . Codeine Nausea Only  . Hydrocodone Nausea And Vomiting  . Morphine And Related Nausea Only  . Oxycodone-Aspirin     REACTION: DEATHLY SICK-VOMITTING     Review of Systems:  CONSTITUTIONAL: No fevers, chills, night sweats, or weight loss.   EYES: +visual changes or eye pain ENT: No hearing changes.  No history of nose bleeds.   RESPIRATORY: No cough, wheezing and shortness of breath.   CARDIOVASCULAR: Negative for chest pain, and palpitations.   GI: Negative for abdominal discomfort, blood in stools or black stools.  No recent change in bowel habits.   GU:  No history of incontinence.   MUSCLOSKELETAL: No history of joint pain or swelling.  No myalgias.   SKIN: Negative for lesions, rash, and itching.   ENDOCRINE:  Negative for cold or heat intolerance, polydipsia or goiter.   PSYCH:  No depression or anxiety symptoms.   NEURO: As Above.   Vital Signs:  BP 140/90 mmHg  Pulse 75  Wt 178 lb 9 oz (80.995 kg)  SpO2 98%  Neurological Exam: MENTAL STATUS including orientation to time, place, person, recent and remote memory, attention span and concentration, language, and fund of knowledge is normal.  Speech is not dysarthric.  CRANIAL NERVES:  Severe visual impairment bilaterally, she is only able to see colors. Irregular pupils, but reactive to light.  Mild bilateral ptosis. Face is symmetric. Palate elevates symmetrically.  Tongue is midline.    MOTOR: Motor strength is 5/5 throughout, including distally.  Tone is normal.   MSRs:  Reflexes are 2+/4 in the upper extremities and absent in the legs bilaterally.  SENSORY: Length dependent pattern of sensory loss to light touch and temperature distal to  knees.    COORDINATION/GAIT: Gait is moderately wide-based with small, short steps, slightly stooped posture and assisted with walker.   Data: Labs 12/17/2012: Na 134, K 4.4, Chloride 99, Ca 10.4, WBC 7.9, RBC 4.3, Hbg 12.2, Hct 36.9  MRI brain wo contrast 12/23/2012: Very small acute/ subacute left thalamic nonhemorrhagic infarct. Prominent small vessel disease type changes. No intracranial hemorrhage. Mild atrophy without hydrocephalus.  Prior clinic notes from Dr. Rexene Alberts, previous neurological workup includes:  EEG 06/03/1995 and 08/31/2008: normal  CSF 1991: normal  Lab Results  Component Value Date   VITAMINB12 127* 04/15/2013   Lab Results  Component Value Date   TSH 0.834 12/06/2012     IMPRESSION/PLAN: 1. Migraines without aura, chronic daily headaches Previously controlled with VPA and triptans, but due to stroke, anticoagulation, and low platelets, VPA was stopped and triptans is contraindicated  She had significant benefit with trial of medrol dose pak, flexeril did not help Still with  daily headaches, although migraines are episodic now Increase Lyrica to 150mg  in the morning and 300mg  qhs.  Side effects discussed especially as we increase the medication  2. Vitamin B12 deficiency  Continue B12 injections (monthly), will administer today 3.  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 Gait instability  Walks with a 4-wheeled rollator.  She has fallen three times in the past year, one while on abixaban, no interval falls.  Physical therapy performed 02/26/12 through 04/23/12  Risks and benefits of stroke prevention vs bleeding complication due to falls needs to be considered if there is worsening gait problems. 4. Sjogren's disease complicated by severe bilateral vision loss  Dependent for most of her ADLs on her husband. She is able to bathe and dress herself.  5. Left thalamic stroke, likely cardioembolic, still with mild word-finding problems on apixiban 6. Memory loss  Neuropsychological testing 05/25/2010: Decreased verbal fluency, otherwise normal exam. 7. Return to clinic in 64-months or sooner as needed  The duration of this appointment visit was 25 minutes of face-to-face time with the patient.  Greater than 50% of this time was spent in counseling, explanation of diagnosis, planning of further management, and coordination of care.   Thank you for allowing me to participate in patient's care.  If I can answer any additional questions, I would be pleased to do so.    Sincerely,    Rayel Santizo K. Posey Pronto, DO

## 2014-03-04 NOTE — Progress Notes (Signed)
Patient in for B12 injection. 

## 2014-03-04 NOTE — Patient Instructions (Signed)
1.  Increase Lyrica to 150mg  in the morning and 300mg  (2 tab) in the evening 2.  Vitamin B12 injection today 3.  Return to clinic in 25-month

## 2014-03-07 DIAGNOSIS — H4011X3 Primary open-angle glaucoma, severe stage: Secondary | ICD-10-CM | POA: Diagnosis not present

## 2014-03-07 DIAGNOSIS — Z9889 Other specified postprocedural states: Secondary | ICD-10-CM | POA: Diagnosis not present

## 2014-03-16 ENCOUNTER — Other Ambulatory Visit: Payer: Self-pay | Admitting: *Deleted

## 2014-03-16 MED ORDER — APIXABAN 5 MG PO TABS: 5.0000 mg | ORAL_TABLET | Freq: Two times a day (BID) | ORAL | Status: DC

## 2014-03-22 ENCOUNTER — Ambulatory Visit
Admission: RE | Admit: 2014-03-22 | Discharge: 2014-03-22 | Disposition: A | Discharge status: Home / Self Care | Payer: Medicare Other | Source: Ambulatory Visit | Attending: Family Medicine | Admitting: Family Medicine

## 2014-03-22 ENCOUNTER — Other Ambulatory Visit: Payer: Self-pay | Admitting: Family Medicine

## 2014-03-22 DIAGNOSIS — S9032XD Contusion of left foot, subsequent encounter: Secondary | ICD-10-CM

## 2014-03-22 DIAGNOSIS — S9001XA Contusion of right ankle, initial encounter: Secondary | ICD-10-CM

## 2014-03-22 DIAGNOSIS — S8001XA Contusion of right knee, initial encounter: Secondary | ICD-10-CM

## 2014-03-22 DIAGNOSIS — Y92099 Unspecified place in other non-institutional residence as the place of occurrence of the external cause: Secondary | ICD-10-CM | POA: Diagnosis not present

## 2014-03-22 DIAGNOSIS — S9032XA Contusion of left foot, initial encounter: Secondary | ICD-10-CM | POA: Diagnosis not present

## 2014-03-22 DIAGNOSIS — W19XXXA Unspecified fall, initial encounter: Secondary | ICD-10-CM | POA: Diagnosis not present

## 2014-03-23 DIAGNOSIS — H4011X3 Primary open-angle glaucoma, severe stage: Secondary | ICD-10-CM | POA: Diagnosis not present

## 2014-03-23 DIAGNOSIS — M3500 Sicca syndrome, unspecified: Secondary | ICD-10-CM | POA: Diagnosis not present

## 2014-03-24 DIAGNOSIS — M7989 Other specified soft tissue disorders: Secondary | ICD-10-CM | POA: Diagnosis not present

## 2014-03-24 DIAGNOSIS — S8011XA Contusion of right lower leg, initial encounter: Secondary | ICD-10-CM | POA: Diagnosis not present

## 2014-03-24 DIAGNOSIS — M1711 Unilateral primary osteoarthritis, right knee: Secondary | ICD-10-CM | POA: Diagnosis not present

## 2014-03-28 ENCOUNTER — Encounter: Payer: Self-pay | Admitting: *Deleted

## 2014-03-28 NOTE — Telephone Encounter (Signed)
This encounter was created in error - please disregard.

## 2014-04-04 ENCOUNTER — Ambulatory Visit: Payer: Medicare Other

## 2014-04-07 ENCOUNTER — Other Ambulatory Visit: Payer: Self-pay | Admitting: *Deleted

## 2014-04-07 ENCOUNTER — Ambulatory Visit (INDEPENDENT_AMBULATORY_CARE_PROVIDER_SITE_OTHER): Payer: Medicare Other | Admitting: *Deleted

## 2014-04-07 ENCOUNTER — Encounter: Payer: Self-pay | Admitting: *Deleted

## 2014-04-07 DIAGNOSIS — E538 Deficiency of other specified B group vitamins: Secondary | ICD-10-CM | POA: Diagnosis not present

## 2014-04-07 MED ORDER — CYANOCOBALAMIN 1000 MCG/ML IJ SOLN
1000.0000 ug | Freq: Once | INTRAMUSCULAR | Status: AC
  Administered 2014-04-07: 1000 ug via INTRAMUSCULAR

## 2014-04-07 NOTE — Progress Notes (Signed)
Patient in for B12 injection. 

## 2014-04-14 DIAGNOSIS — M170 Bilateral primary osteoarthritis of knee: Secondary | ICD-10-CM | POA: Diagnosis not present

## 2014-04-14 DIAGNOSIS — M3500 Sicca syndrome, unspecified: Secondary | ICD-10-CM | POA: Diagnosis not present

## 2014-04-19 ENCOUNTER — Ambulatory Visit: Payer: Medicare Other | Attending: Family Medicine | Admitting: Physical Therapy

## 2014-04-19 ENCOUNTER — Encounter: Payer: Self-pay | Admitting: Physical Therapy

## 2014-04-19 DIAGNOSIS — R531 Weakness: Secondary | ICD-10-CM

## 2014-04-19 DIAGNOSIS — M2550 Pain in unspecified joint: Secondary | ICD-10-CM | POA: Insufficient documentation

## 2014-04-19 DIAGNOSIS — R29818 Other symptoms and signs involving the nervous system: Secondary | ICD-10-CM | POA: Insufficient documentation

## 2014-04-19 DIAGNOSIS — R269 Unspecified abnormalities of gait and mobility: Secondary | ICD-10-CM | POA: Insufficient documentation

## 2014-04-19 DIAGNOSIS — R2689 Other abnormalities of gait and mobility: Secondary | ICD-10-CM

## 2014-04-19 NOTE — Progress Notes (Signed)
HPI: FU atrial fibrillation. She has a hx of Sjogren's syndrome, seizure disorder, HTN, HL, peripheral neuropathy. She was admitted 9/14 after presenting with overall functional decline, cough and generalized weakness and malaise. She was found to be in atrial fibrillation with RVR. CHADS2-VASc=4. She was placed on Xarelto for anticoagulation. Echo (12/07/12): Moderate focal basal hypertrophy of the septum, vigorous LV function, EF 65-70%, indeterminate diastolic function, normal wall motion, trivial MR, moderate LAE, trivial TR. She converted to sinus rhythm. MRI in October of 2014 showed a very small acute/subacute left thalamic infarct. Since last seen, she denies dyspnea, chest pain, palpitations or syncope. Minimal pedal edema. She has fallen several times.  Current Outpatient Prescriptions  Medication Sig Dispense Refill  . apixaban (ELIQUIS) 5 MG TABS tablet Take 1 tablet (5 mg total) by mouth 2 (two) times daily. 60 tablet 1  . clobetasol ointment (TEMOVATE) 0.05 %     . cyanocobalamin (,VITAMIN B-12,) 1000 MCG/ML injection Inject 1 mL (1,000 mcg total) into the muscle once. 1 mL 0  . cyclobenzaprine (FLEXERIL) 5 MG tablet TAKE 1 TABLET (5 MG TOTAL) BY MOUTH AT BEDTIME. 90 tablet 0  . cyclobenzaprine (FLEXERIL) 5 MG tablet Take 1 tablet (5 mg total) by mouth at bedtime. 90 tablet 3  . diltiazem (CARDIZEM CD) 120 MG 24 hr capsule Take 1 capsule (120 mg total) by mouth daily. 30 capsule 11  . dorzolamide-timolol (COSOPT) 22.3-6.8 MG/ML ophthalmic solution Place 1 drop into both eyes 2 (two) times daily.     . fenofibrate (TRICOR) 145 MG tablet Take 145 mg by mouth daily.    Marland Kitchen FLUZONE HIGH-DOSE 0.5 ML SUSY   0  . furosemide (LASIX) 20 MG tablet One tablet daily as needed for swelling 90 tablet 3  . LORazepam (ATIVAN) 1 MG tablet Take 1 mg by mouth at bedtime as needed.   1  . omeprazole-sodium bicarbonate (ZEGERID) 40-1100 MG per capsule     . ondansetron (ZOFRAN) 4 MG tablet TAKE 1  TABLET (4 MG TOTAL) BY MOUTH EVERY 8 (EIGHT) HOURS AS NEEDED FOR NAUSEA OR VOMITING. 90 tablet 0  . PARoxetine (PAXIL) 10 MG tablet     . pilocarpine (SALAGEN) 5 MG tablet Take 5 mg by mouth 3 (three) times daily.    . pregabalin (LYRICA) 150 MG capsule Take one tablet at bedtime and two tablet at bedtime. 240 capsule 3  . sucralfate (CARAFATE) 1 G tablet Take 1 g by mouth 4 (four) times daily.     . traMADol (ULTRAM) 50 MG tablet     . Vitamin D, Ergocalciferol, (DRISDOL) 50000 UNITS CAPS Take 50,000 Units by mouth every 7 (seven) days. Does not remember what day of the week she takes     No current facility-administered medications for this visit.     Past Medical History  Diagnosis Date  . Hypertension   . Peripheral neuropathy   . Sjogren's syndrome     bilateral vision  . Gait disorder 06/05/2012  . Migraines   . Paroxysmal atrial fibrillation 12/06/2012    Echo (12/07/12): Moderate focal basal hypertrophy of the septum, vigorous LV function, EF 65-70%, indeterminate diastolic function, normal wall motion, trivial MR, moderate LAE, trivial TR;  Xarelto started 11/2012  . Osteoarthritis   . Stroke   . Allergy     Past Surgical History  Procedure Laterality Date  . Shoulder open rotator cuff repair    . Lumbar laminectomy    . Abdominal hysterectomy    .  Eye surgery      History   Social History  . Marital Status: Married    Spouse Name: jesse    Number of Children: 2  . Years of Education: college   Occupational History  . retired    Social History Main Topics  . Smoking status: Former Research scientist (life sciences)  . Smokeless tobacco: Never Used  . Alcohol Use: No  . Drug Use: No  . Sexual Activity: Not on file   Other Topics Concern  . Not on file   Social History Narrative   She lives with husband of 36 years.  They have a one story home.    ROS: no fevers or chills, productive cough, hemoptysis, dysphasia, odynophagia, melena, hematochezia, dysuria, hematuria, rash, seizure  activity, orthopnea, PND, claudication. Remaining systems are negative.  Physical Exam: Well-developed obese in no acute distress.  Skin is warm and dry.  HEENT is normal.  Neck is supple.  Chest is clear to auscultation with normal expansion.  Cardiovascular exam is regular rate and rhythm.  Abdominal exam nontender or distended. No masses palpated. Extremities show trace edema. neuro grossly intact  ECG sinus rhythm at a rate of 77. Nonspecific ST changes.

## 2014-04-20 NOTE — Therapy (Signed)
[facility] [address] [place], [place], [address] Phone: [phone]   Fax:  [phone]  Physical Therapy Evaluation  Patient Details  Name: Krystal Clarke MRN: [id number] Date of Birth: [birth year] Referring Provider:  Vidal Schwalbe, MD  Encounter Date: 04/19/2014      PT End of Session - 04/20/14 1730    Visit Number 1   Number of Visits 9   PT Start Time 9983   PT Stop Time 1400   PT Time Calculation (min) 45 min   Equipment Utilized During Treatment Gait belt   Activity Tolerance Patient tolerated treatment well   Behavior During Therapy [facility] for tasks assessed/performed      Past Medical History  Diagnosis Date  . Hypertension   . Peripheral neuropathy   . Sjogren's syndrome     bilateral vision  . Gait disorder 06/05/2012  . Migraines   . Paroxysmal atrial fibrillation 12/06/2012    Echo (12/07/12): Moderate focal basal hypertrophy of the septum, vigorous LV function, EF 65-70%, indeterminate diastolic function, normal wall motion, trivial MR, moderate LAE, trivial TR;  Xarelto started 11/2012  . Osteoarthritis   . Stroke   . Allergy     Past Surgical History  Procedure Laterality Date  . Shoulder open rotator cuff repair    . Lumbar laminectomy    . Abdominal hysterectomy    . Eye surgery      There were no vitals taken for this visit.  Visit Diagnosis:  Abnormality of gait - Plan: PT plan of care cert/re-cert  Balance problems - Plan: PT plan of care cert/re-cert  Weakness generalized - Plan: PT plan of care cert/re-cert  Multiple joint pain - Plan: PT plan of care cert/re-cert      Subjective Assessment - 04/19/14 1328    Symptoms This 79yo female has increased number of falls. She has history of CVA and osteoarthritis. She presents for PT evaluation.    Patient Stated Goals She wants to reduce falls and walk better.   Currently in Pain? Yes   Pain Score 7   in last week worst  9/10, best 5/10   Pain Location Knee   Pain Orientation Right;Left   Pain Descriptors / Indicators Sharp;Aching   Pain Type Chronic pain   Pain Onset More than a month ago   Pain Frequency Constant   Aggravating Factors  walking, standing   Pain Relieving Factors rest, medication   Multiple Pain Sites Yes   Pain Score 0  in last week worst 8/10   Pain Type Chronic pain   Pain Location Back   Pain Descriptors / Indicators Aching          [facility] PT Assessment - 04/19/14 1315    Assessment   Medical Diagnosis Gait abnormality   Precautions   Precautions Fall   Restrictions   Weight Bearing Restrictions No   Balance Screen   Has the patient fallen in the past 6 months Yes   How many times? 4  driveway with wrong shoe, bending over/fell back, left leg g   Has the patient had a decrease in activity level because of a fear of falling?  Yes   Is the patient reluctant to leave their home because of a fear of falling?  Yes   [place] Private residence   Living Arrangements Spouse/significant other   Type of [place] Access Level entry  2" threshold  Home Layout One level   [place] - single point;Walker - 4 wheels;Shower seat;Grab bars - tub/shower   Prior Function   Level of Independence Independent with basic ADLs;Independent with homemaking with ambulation;Independent with transfers   Vocation Retired   AROM   Overall AROM  Deficits   Overall AROM Comments Decreased trunk, hip, knees & ankle ROM   Strength   Overall Strength Deficits   Overall Strength Comments Tested grossly in sitting & standing: decreased trunk flex /ext, hip ext, flex & abd, knee flex & ext, ankle DF   Ambulation/Gait   Ambulation/Gait Yes   Ambulation/Gait Assistance 4: Min guard;4: Min assist  Min gaurd with rollator and MinA hand hold without device   Ambulation/Gait Assistance Details Patient reports "wall walks" in home with no device & uses  rollator "most" of time going out   Ambulation Distance (Feet) 100 Feet   Assistive device Rollator   Gait Pattern Step-to pattern;Decreased step length - right;Decreased step length - left;Decreased stride length;Decreased hip/knee flexion - right;Decreased hip/knee flexion - left;Right foot flat;Left foot flat;Right flexed knee in stance;Left flexed knee in stance;Trunk flexed;Wide base of support;Poor foot clearance - left;Poor foot clearance - right   Ambulation Surface Level;Indoor   Gait velocity 1.14 ft/sec   Berg Balance Test   Sit to Stand Needs minimal aid to stand or to stabilize   Standing Unsupported Needs several tries to stand 30 seconds unsupported   Sitting with Back Unsupported but Feet Supported on Floor or Stool Able to sit safely and securely 2 minutes   Stand to Sit Sits independently, has uncontrolled descent   Transfers Able to transfer safely, definite need of hands   Standing Unsupported with Eyes Closed Needs help to keep from falling   Standing Ubsupported with Feet Together Needs help to attain position and unable to hold for 15 seconds   From Standing, Reach Forward with Outstretched Arm Loses balance while trying/requires external support   From Standing Position, Pick up Object from Floor Unable to try/needs assist to keep balance   From Standing Position, Turn to Look Behind Over each Shoulder Needs assist to keep from losing balance and falling   Turn 360 Degrees Needs assistance while turning   Standing Unsupported, Alternately Place Feet on Step/Stool Needs assistance to keep from falling or unable to try   Standing Unsupported, One Foot in [employer] balance while stepping or standing   Standing on One Leg Unable to try or needs assist to prevent fall   Total Score 10   Timed Up and Go Test   Normal TUG (seconds) 25.81  25.81 sec with rollator, 27.77sec without device with minA                               PT Long Term Goals -  04/20/14 1741    PT LONG TERM GOAL #1   Title verbalizes understanding of Fall Prevention Strategies (Target Date: 05/20/14)   Time 4   Period Weeks   Status New   PT LONG TERM GOAL #2   Title demonstrates understanding of HEP (Target Date: 05/20/14)   Time 4   Period Weeks   Status New               Plan - 04/19/14 1400    Clinical Impression Statement This 79yo has multi-factoral issues placing her at high risk for falls including multiple diagonses, fall history,  legally blind, Berg Balance 10/56, weakness and Timed Up-Go 25.81sec. Patient would benefit from education & general HEP.   Pt will benefit from skilled therapeutic intervention in order to improve on the following deficits Abnormal gait;Decreased activity tolerance;Decreased balance;Decreased endurance;Decreased knowledge of use of DME;Decreased mobility;Decreased range of motion;Decreased strength;Impaired flexibility;Pain;Impaired vision/preception   Rehab Potential Good   Clinical Impairments Affecting Rehab Potential legally blind, multiple joint arthritis with pain limiting mobility,    PT Frequency 2x / week   PT Duration 4 weeks   PT Treatment/Interventions ADLs/Self Care Home Management;DME Instruction;Gait training;Functional mobility training;Therapeutic activities;Therapeutic exercise;Balance training;Neuromuscular re-education;Patient/family education   PT Next Visit Plan Fall Prevention strategies especially use of RW   PT Home Exercise Plan [place] exercises   Consulted and Agree with Plan of Care Patient;Family member/caregiver   Family Member Consulted husband          G-Codes - 05-15-14 1743    Functional Assessment Tool Used Unknowledgeable in appropriate HEP & Fall Prevention Strategies   Functional Limitation Self care   Self Care Current Status [phone]) At least 80 percent but less than 100 percent impaired, limited or restricted   Self Care Goal Status ([id number]) At least 60 percent but less than 80  percent impaired, limited or restricted       Problem List Patient Active Problem List   Diagnosis Date Noted  . B12 deficiency 05/20/2013  . Dehydration with hyponatremia 12/07/2012  . Seizure disorder 12/07/2012  . Atrial fibrillation with RVR 12/06/2012  . Acute respiratory failure with hypoxia 12/06/2012  . Peripheral neuropathy   . Sjogren's syndrome   . Unspecified hereditary and idiopathic peripheral neuropathy 05/09/2012  . Localization-related (focal) (partial) epilepsy and epileptic syndromes with simple partial seizures, without mention of intractable epilepsy 05/09/2012  . ANEMIA, B12 DEFICIENCY 07/19/2009  . SHINGLES, HX OF 07/17/2009  . ANXIETY STATE, UNSPECIFIED 01/27/2009  . ABDOMINAL PAIN OTHER SPECIFIED SITE 01/27/2009  . DIVERTICULITIS, HX OF 01/27/2009  . SLEEP APNEA 12/19/2008  . MIXED HYPERLIPIDEMIA 07/18/2008  . HYPERTRIGLYCERIDEMIA 08/28/2006  . MIGRAINE HEADACHE 08/28/2006  . HYPERTENSION 08/28/2006  . GERD 08/28/2006  . PSORIASIS 08/28/2006  . OSTEOARTHRITIS 08/28/2006  . OSTEOPOROSIS 08/28/2006    Taresa Montville PT, DPT 15-May-2014, 5:47 PM  [facility] [address] [place], [place], [address] Phone: [phone]   Fax:  [phone]

## 2014-04-21 ENCOUNTER — Ambulatory Visit (INDEPENDENT_AMBULATORY_CARE_PROVIDER_SITE_OTHER): Payer: Medicare Other | Admitting: Cardiology

## 2014-04-21 ENCOUNTER — Encounter: Payer: Self-pay | Admitting: Cardiology

## 2014-04-21 VITALS — BP 140/68 | HR 77 | Ht 61.0 in | Wt 185.5 lb

## 2014-04-21 DIAGNOSIS — I10 Essential (primary) hypertension: Secondary | ICD-10-CM

## 2014-04-21 DIAGNOSIS — I4891 Unspecified atrial fibrillation: Secondary | ICD-10-CM | POA: Diagnosis not present

## 2014-04-21 MED ORDER — APIXABAN 5 MG PO TABS: 5.0000 mg | ORAL_TABLET | Freq: Two times a day (BID) | ORAL | Status: DC

## 2014-04-21 NOTE — Assessment & Plan Note (Signed)
Blood pressure controlled.continue present medications. 

## 2014-04-21 NOTE — Patient Instructions (Signed)
Your physician wants you to follow-up in: 6 MONTHS WITH DR CRENSHAW You will receive a reminder letter in the mail two months in advance. If you don't receive a letter, please call our office to schedule the follow-up appointment.   Your physician recommends that you HAVE LAB WORK TODAY 

## 2014-04-21 NOTE — Assessment & Plan Note (Signed)
Patient remains in sinus rhythm. Continue Cardizem. She has fallen several times. We again discussed the risks and benefits of anticoagulation. She would like to continue anticoagulation for now. We may need to discontinue this in the future if she falls more frequently. Continue apixaban; check hemoglobin and renal function.

## 2014-04-22 ENCOUNTER — Telehealth: Payer: Self-pay | Admitting: Cardiology

## 2014-04-22 LAB — CBC
HCT: 35.1 % — ABNORMAL LOW (ref 36.0–46.0)
Hemoglobin: 10.7 g/dL — ABNORMAL LOW (ref 12.0–15.0)
MCH: 22.3 pg — AB (ref 26.0–34.0)
MCHC: 30.5 g/dL (ref 30.0–36.0)
MCV: 73.3 fL — ABNORMAL LOW (ref 78.0–100.0)
MPV: 10.2 fL (ref 8.6–12.4)
Platelets: 302 10*3/uL (ref 150–400)
RBC: 4.79 MIL/uL (ref 3.87–5.11)
RDW: 17.7 % — ABNORMAL HIGH (ref 11.5–15.5)
WBC: 10.5 10*3/uL (ref 4.0–10.5)

## 2014-04-22 LAB — BASIC METABOLIC PANEL WITH GFR
BUN: 16 mg/dL (ref 6–23)
CALCIUM: 10.2 mg/dL (ref 8.4–10.5)
CHLORIDE: 98 meq/L (ref 96–112)
CO2: 25 meq/L (ref 19–32)
CREATININE: 0.8 mg/dL (ref 0.50–1.10)
GFR, Est African American: 83 mL/min
GFR, Est Non African American: 72 mL/min
Glucose, Bld: 117 mg/dL — ABNORMAL HIGH (ref 70–99)
POTASSIUM: 4.2 meq/L (ref 3.5–5.3)
SODIUM: 133 meq/L — AB (ref 135–145)

## 2014-04-22 MED ORDER — APIXABAN 5 MG PO TABS: 5.0000 mg | ORAL_TABLET | Freq: Two times a day (BID) | ORAL | Status: DC

## 2014-04-22 NOTE — Telephone Encounter (Signed)
Spoke with patient's husband and sent Rx to patients local pharmacy

## 2014-04-22 NOTE — Telephone Encounter (Signed)
Rx was sent into [facility] for correct quantity. LMTCB to see if pt. Wanted it sent to local pharmacy instead of mailorder.

## 2014-04-22 NOTE — Telephone Encounter (Signed)
Pt's husband called in stating that the wrong quantity was called into the pharmacy. She needs 180 pills for her Eliquis and she received 60 pills. The pharmacy agreed to let the pt bring back the 60 pills once the right quantity was called in. [facility] [address]  Thanks

## 2014-04-25 DIAGNOSIS — Z9889 Other specified postprocedural states: Secondary | ICD-10-CM | POA: Diagnosis not present

## 2014-04-25 DIAGNOSIS — M75102 Unspecified rotator cuff tear or rupture of left shoulder, not specified as traumatic: Secondary | ICD-10-CM | POA: Diagnosis not present

## 2014-04-25 DIAGNOSIS — M1711 Unilateral primary osteoarthritis, right knee: Secondary | ICD-10-CM | POA: Diagnosis not present

## 2014-04-26 ENCOUNTER — Encounter: Payer: Self-pay | Admitting: Physical Therapy

## 2014-04-26 ENCOUNTER — Ambulatory Visit: Payer: Medicare Other | Admitting: Physical Therapy

## 2014-04-26 ENCOUNTER — Encounter: Payer: Self-pay | Admitting: *Deleted

## 2014-04-26 ENCOUNTER — Other Ambulatory Visit: Payer: Self-pay | Admitting: *Deleted

## 2014-04-26 DIAGNOSIS — D649 Anemia, unspecified: Secondary | ICD-10-CM

## 2014-04-26 DIAGNOSIS — R531 Weakness: Secondary | ICD-10-CM

## 2014-04-26 DIAGNOSIS — R2689 Other abnormalities of gait and mobility: Secondary | ICD-10-CM

## 2014-04-26 DIAGNOSIS — R269 Unspecified abnormalities of gait and mobility: Secondary | ICD-10-CM

## 2014-04-26 DIAGNOSIS — M2550 Pain in unspecified joint: Secondary | ICD-10-CM

## 2014-04-26 DIAGNOSIS — R29818 Other symptoms and signs involving the nervous system: Secondary | ICD-10-CM | POA: Diagnosis not present

## 2014-04-26 NOTE — Therapy (Signed)
[facility] [address] [place], [place], [address] Phone: [phone]   Fax:  [phone]  Physical Therapy Treatment  Patient Details  Name: Krystal Clarke MRN: [id number] Date of Birth: May 22, 1935 Referring Provider:  Vidal Schwalbe, MD  Encounter Date: 04/26/2014      PT End of Session - 04/26/14 1411    Visit Number 2   Number of Visits 9   PT Start Time 1402   PT Stop Time 1443   PT Time Calculation (min) 41 min   Equipment Utilized During Treatment Gait belt   Activity Tolerance Patient tolerated treatment well   Behavior During Therapy [facility] for tasks assessed/performed      Past Medical History  Diagnosis Date  . Hypertension   . Peripheral neuropathy   . Sjogren's syndrome     bilateral vision  . Gait disorder 06/05/2012  . Migraines   . Paroxysmal atrial fibrillation 12/06/2012    Echo (12/07/12): Moderate focal basal hypertrophy of the septum, vigorous LV function, EF 65-70%, indeterminate diastolic function, normal wall motion, trivial MR, moderate LAE, trivial TR;  Xarelto started 11/2012  . Osteoarthritis   . Stroke   . Allergy     Past Surgical History  Procedure Laterality Date  . Shoulder open rotator cuff repair    . Lumbar laminectomy    . Abdominal hysterectomy    . Eye surgery      There were no vitals taken for this visit.  Visit Diagnosis:  Abnormality of gait  Balance problems  Weakness generalized  Multiple joint pain      Subjective Assessment - 04/26/14 1410    Symptoms No new complaints. No new falls since eval. No pain currently.   Currently in Pain? No/denies           Balance Exercises - 04/26/14 1429    [place] PROGRAM   Head Movements Sitting;5 reps   Neck Movements Sitting;5 reps  moved head up/down vs chin tuck   Back Extension Sitting;5 reps   Trunk Movements Sitting;5 reps  upper trunk rotation with reaching behind her   Ankle Movements  Sitting;Other reps (comment)  20 reps           PT Education - 04/26/14 1547    Education provided Yes   Education Details Fall prevention; Initiated OTAGO program   Person(s) Educated Patient   Methods Explanation;Demonstration;Handout;Verbal cues   Comprehension Verbalized understanding;Returned demonstration;Need further instruction           PT Long Term Goals - 04/20/14 1741    PT LONG TERM GOAL #1   Title verbalizes understanding of Fall Prevention Strategies (Target Date: 05/20/14)   Time 4   Period Weeks   Status New   PT LONG TERM GOAL #2   Title demonstrates understanding of HEP (Target Date: 05/20/14)   Time 4   Period Weeks   Status New           Plan - 04/26/14 1411    Clinical Impression Statement Educated pt on fall safety strategies and use of walker for safety/decreased fall risk with mobility. Pt reports her daughter and spouse are working on plans to rearrange her house so she can use the walker in it vs the two canes she currnetly uses in house. Initiated [place] program today for home exercise program without issues.                     Pt will  benefit from skilled therapeutic intervention in order to improve on the following deficits Abnormal gait;Decreased activity tolerance;Decreased balance;Decreased endurance;Decreased knowledge of use of DME;Decreased mobility;Decreased range of motion;Decreased strength;Impaired flexibility;Pain;Impaired vision/preception   Rehab Potential Good   Clinical Impairments Affecting Rehab Potential legally blind, multiple joint arthritis with pain limiting mobility,    PT Frequency 2x / week   PT Duration 4 weeks   PT Treatment/Interventions ADLs/Self Care Home Management;DME Instruction;Gait training;Functional mobility training;Therapeutic activities;Therapeutic exercise;Balance training;Neuromuscular re-education;Patient/family education   PT Next Visit Plan Complete OTAGO program   PT Home Exercise Plan [place]  exercises   Consulted and Agree with Plan of Care Patient;Family member/caregiver   Family Member Consulted husband       Problem List Patient Active Problem List   Diagnosis Date Noted  . B12 deficiency 05/20/2013  . Dehydration with hyponatremia 12/07/2012  . Seizure disorder 12/07/2012  . Atrial fibrillation with RVR 12/06/2012  . Acute respiratory failure with hypoxia 12/06/2012  . Peripheral neuropathy   . Sjogren's syndrome   . Unspecified hereditary and idiopathic peripheral neuropathy 05/09/2012  . Localization-related (focal) (partial) epilepsy and epileptic syndromes with simple partial seizures, without mention of intractable epilepsy 05/09/2012  . ANEMIA, B12 DEFICIENCY 07/19/2009  . SHINGLES, HX OF 07/17/2009  . ANXIETY STATE, UNSPECIFIED 01/27/2009  . ABDOMINAL PAIN OTHER SPECIFIED SITE 01/27/2009  . DIVERTICULITIS, HX OF 01/27/2009  . SLEEP APNEA 12/19/2008  . MIXED HYPERLIPIDEMIA 07/18/2008  . HYPERTRIGLYCERIDEMIA 08/28/2006  . MIGRAINE HEADACHE 08/28/2006  . Essential hypertension 08/28/2006  . GERD 08/28/2006  . PSORIASIS 08/28/2006  . OSTEOARTHRITIS 08/28/2006  . OSTEOPOROSIS 08/28/2006    Willow Ora 04/26/2014, 3:51 PM  Willow Ora, PTA, [facility] [address], [place], [place] [address] [phone] 04/26/2014, 3:51 PM

## 2014-04-26 NOTE — Patient Instructions (Signed)

## 2014-04-26 NOTE — Telephone Encounter (Signed)
This encounter was created in error - please disregard.

## 2014-04-28 ENCOUNTER — Ambulatory Visit: Payer: Medicare Other | Admitting: Physical Therapy

## 2014-04-28 ENCOUNTER — Encounter: Payer: Self-pay | Admitting: Physical Therapy

## 2014-04-28 DIAGNOSIS — R29818 Other symptoms and signs involving the nervous system: Secondary | ICD-10-CM | POA: Diagnosis not present

## 2014-04-28 DIAGNOSIS — M2550 Pain in unspecified joint: Secondary | ICD-10-CM

## 2014-04-28 DIAGNOSIS — R2689 Other abnormalities of gait and mobility: Secondary | ICD-10-CM

## 2014-04-28 DIAGNOSIS — R269 Unspecified abnormalities of gait and mobility: Secondary | ICD-10-CM | POA: Diagnosis not present

## 2014-04-28 DIAGNOSIS — R531 Weakness: Secondary | ICD-10-CM

## 2014-04-29 NOTE — Therapy (Signed)
[facility] [address] [place], [place], [address] Phone: [phone]   Fax:  [phone]  Physical Therapy Treatment  Patient Details  Name: Krystal Clarke MRN: [id number] Date of Birth: 1935/03/29 Referring Provider:  Vidal Schwalbe, MD  Encounter Date: 04/28/2014      PT End of Session - 04/28/14 1021    Visit Number 3   Number of Visits 9   PT Start Time 1018   PT Stop Time 1058   PT Time Calculation (min) 40 min   Equipment Utilized During Treatment Gait belt   Activity Tolerance Patient tolerated treatment well   Behavior During Therapy [facility] for tasks assessed/performed      Past Medical History  Diagnosis Date  . Hypertension   . Peripheral neuropathy   . Sjogren's syndrome     bilateral vision  . Gait disorder 06/05/2012  . Migraines   . Paroxysmal atrial fibrillation 12/06/2012    Echo (12/07/12): Moderate focal basal hypertrophy of the septum, vigorous LV function, EF 65-70%, indeterminate diastolic function, normal wall motion, trivial MR, moderate LAE, trivial TR;  Xarelto started 11/2012  . Osteoarthritis   . Stroke   . Allergy     Past Surgical History  Procedure Laterality Date  . Shoulder open rotator cuff repair    . Lumbar laminectomy    . Abdominal hysterectomy    . Eye surgery      There were no vitals taken for this visit.  Visit Diagnosis:  Abnormality of gait  Balance problems  Weakness generalized  Multiple joint pain        Balance Exercises - 04/28/14 1022    OTAGO PROGRAM   Head Movements Sitting;5 reps   Neck Movements Sitting;5 reps  moving head up/down   Back Extension Sitting;5 reps   Trunk Movements Sitting;5 reps  upper trunk rotation with reaching behind her   Ankle Movements Sitting;Other reps (comment)  20   Knee Extensor 10 reps   Knee Flexor 10 reps   Hip ABductor 10 reps   Ankle Plantorflexors 20 reps, support   Ankle Dorsiflexors 20 reps,  support   Knee Bends 10 reps, support   Backwards Walking Support   Walking and Turning Around Assistive device  counter top support   Sideways Walking Assistive device  counter top support          PT Education - 04/29/14 1255    Education provided Yes   Education Details Added new exercises to [employer]) Educated Patient   Methods Explanation;Demonstration;Handout;Verbal cues   Comprehension Verbalized understanding;Need further instruction;Verbal cues required           PT Long Term Goals - 04/20/14 1741    PT LONG TERM GOAL #1   Title verbalizes understanding of Fall Prevention Strategies (Target Date: 05/20/14)   Time 4   Period Weeks   Status New   PT LONG TERM GOAL #2   Title demonstrates understanding of HEP (Target Date: 05/20/14)   Time 4   Period Weeks   Status New           Plan - 04/28/14 1021    Clinical Impression Statement Continued with OTAGO for home without issues. Added new exercises in pt's booklet for home.   Pt will benefit from skilled therapeutic intervention in order to improve on the following deficits Abnormal gait;Decreased activity tolerance;Decreased balance;Decreased endurance;Decreased knowledge of use of DME;Decreased mobility;Decreased range of motion;Decreased strength;Impaired flexibility;Pain;Impaired vision/preception  Rehab Potential Good   Clinical Impairments Affecting Rehab Potential legally blind, multiple joint arthritis with pain limiting mobility,    PT Frequency 2x / week   PT Duration 4 weeks   PT Treatment/Interventions ADLs/Self Care Home Management;DME Instruction;Gait training;Functional mobility training;Therapeutic activities;Therapeutic exercise;Balance training;Neuromuscular re-education;Patient/family education   PT Next Visit Plan Complete OTAGO program   PT Home Exercise Plan [place] exercises   Consulted and Agree with Plan of Care Patient;Family member/caregiver   Family Member Consulted  husband      Problem List Patient Active Problem List   Diagnosis Date Noted  . B12 deficiency 05/20/2013  . Dehydration with hyponatremia 12/07/2012  . Seizure disorder 12/07/2012  . Atrial fibrillation with RVR 12/06/2012  . Acute respiratory failure with hypoxia 12/06/2012  . Peripheral neuropathy   . Sjogren's syndrome   . Unspecified hereditary and idiopathic peripheral neuropathy 05/09/2012  . Localization-related (focal) (partial) epilepsy and epileptic syndromes with simple partial seizures, without mention of intractable epilepsy 05/09/2012  . ANEMIA, B12 DEFICIENCY 07/19/2009  . SHINGLES, HX OF 07/17/2009  . ANXIETY STATE, UNSPECIFIED 01/27/2009  . ABDOMINAL PAIN OTHER SPECIFIED SITE 01/27/2009  . DIVERTICULITIS, HX OF 01/27/2009  . SLEEP APNEA 12/19/2008  . MIXED HYPERLIPIDEMIA 07/18/2008  . HYPERTRIGLYCERIDEMIA 08/28/2006  . MIGRAINE HEADACHE 08/28/2006  . Essential hypertension 08/28/2006  . GERD 08/28/2006  . PSORIASIS 08/28/2006  . OSTEOARTHRITIS 08/28/2006  . OSTEOPOROSIS 08/28/2006    Willow Ora 04/29/2014, 12:57 PM  Willow Ora, PTA, [facility] [address], [place], [place] [address] [phone] 04/29/2014, 12:57 PM

## 2014-05-03 ENCOUNTER — Ambulatory Visit: Payer: Medicare Other | Admitting: Physical Therapy

## 2014-05-03 ENCOUNTER — Encounter: Payer: Self-pay | Admitting: Physical Therapy

## 2014-05-03 DIAGNOSIS — R531 Weakness: Secondary | ICD-10-CM

## 2014-05-03 DIAGNOSIS — R2689 Other abnormalities of gait and mobility: Secondary | ICD-10-CM

## 2014-05-03 DIAGNOSIS — R29818 Other symptoms and signs involving the nervous system: Secondary | ICD-10-CM | POA: Diagnosis not present

## 2014-05-03 DIAGNOSIS — R269 Unspecified abnormalities of gait and mobility: Secondary | ICD-10-CM

## 2014-05-03 DIAGNOSIS — M2550 Pain in unspecified joint: Secondary | ICD-10-CM

## 2014-05-04 NOTE — Therapy (Signed)
[facility] [address] [place], [place], [address] Phone: [phone]   Fax:  [phone]  Physical Therapy Treatment  Patient Details  Name: Krystal Clarke MRN: [id number] Date of Birth: [birth year] Referring Provider:  Vidal Schwalbe, MD  Encounter Date: 05/03/2014      PT End of Session - 05/03/14 1400    Visit Number 4   Number of Visits 9   PT Start Time 1400   PT Stop Time 1445   PT Time Calculation (min) 45 min   Equipment Utilized During Treatment Gait belt   Activity Tolerance Patient tolerated treatment well   Behavior During Therapy [facility] for tasks assessed/performed      Past Medical History  Diagnosis Date  . Hypertension   . Peripheral neuropathy   . Sjogren's syndrome     bilateral vision  . Gait disorder 06/05/2012  . Migraines   . Paroxysmal atrial fibrillation 12/06/2012    Echo (12/07/12): Moderate focal basal hypertrophy of the septum, vigorous LV function, EF 65-70%, indeterminate diastolic function, normal wall motion, trivial MR, moderate LAE, trivial TR;  Xarelto started 11/2012  . Osteoarthritis   . Stroke   . Allergy     Past Surgical History  Procedure Laterality Date  . Shoulder open rotator cuff repair    . Lumbar laminectomy    . Abdominal hysterectomy    . Eye surgery      There were no vitals taken for this visit.  Visit Diagnosis:  Abnormality of gait  Balance problems  Weakness generalized  Multiple joint pain      Subjective Assessment - 05/03/14 1409    Symptoms (p) She reports doing her exercises. The one that turning head causes dizziness.   Currently in Pain? (p) No/denies     Therapeutic Exercise: patient performed OTAGO exercise program with demonstration /visual cues, verbal cues and tactile cues on movements.                       PT Education - 05/03/14 1400    Education provided Yes   Education Details vestibular system &  dizziness, OTAGO exercises   Person(s) Educated Patient   Methods Explanation;Demonstration   Comprehension Verbalized understanding;Returned demonstration;Need further instruction             PT Long Term Goals - 04/20/14 1741    PT LONG TERM GOAL #1   Title verbalizes understanding of Fall Prevention Strategies (Target Date: 05/20/14)   Time 4   Period Weeks   Status New   PT LONG TERM GOAL #2   Title demonstrates understanding of HEP (Target Date: 05/20/14)   Time 4   Period Weeks   Status New               Plan - 05/03/14 1400    Clinical Impression Statement Patient appears to understand OTAGO exercises and she reports they are helping. She gets dizzy with head movements especially when she closes her eyes, from vestibular issues. She is safe to perform head movements in sitting to increase threshold of movements that cause dizziness.   Pt will benefit from skilled therapeutic intervention in order to improve on the following deficits Abnormal gait;Decreased activity tolerance;Decreased balance;Decreased endurance;Decreased knowledge of use of DME;Decreased mobility;Decreased range of motion;Decreased strength;Impaired flexibility;Pain;Impaired vision/preception   Rehab Potential Good   Clinical Impairments Affecting Rehab Potential legally blind, multiple joint arthritis with pain limiting mobility,    PT Frequency  2x / week   PT Duration 4 weeks   PT Treatment/Interventions ADLs/Self Care Home Management;DME Instruction;Gait training;Functional mobility training;Therapeutic activities;Therapeutic exercise;Balance training;Neuromuscular re-education;Patient/family education   PT Next Visit Plan Review OTAGO program, Fall prevention strategy education, gait with rollator (educate on brakes)   PT Home Exercise Plan [place] exercises   Consulted and Agree with Plan of Care Patient        Problem List Patient Active Problem List   Diagnosis Date Noted  . B12 deficiency  05/20/2013  . Dehydration with hyponatremia 12/07/2012  . Seizure disorder 12/07/2012  . Atrial fibrillation with RVR 12/06/2012  . Acute respiratory failure with hypoxia 12/06/2012  . Peripheral neuropathy   . Sjogren's syndrome   . Unspecified hereditary and idiopathic peripheral neuropathy 05/09/2012  . Localization-related (focal) (partial) epilepsy and epileptic syndromes with simple partial seizures, without mention of intractable epilepsy 05/09/2012  . ANEMIA, B12 DEFICIENCY 07/19/2009  . SHINGLES, HX OF 07/17/2009  . ANXIETY STATE, UNSPECIFIED 01/27/2009  . ABDOMINAL PAIN OTHER SPECIFIED SITE 01/27/2009  . DIVERTICULITIS, HX OF 01/27/2009  . SLEEP APNEA 12/19/2008  . MIXED HYPERLIPIDEMIA 07/18/2008  . HYPERTRIGLYCERIDEMIA 08/28/2006  . MIGRAINE HEADACHE 08/28/2006  . Essential hypertension 08/28/2006  . GERD 08/28/2006  . PSORIASIS 08/28/2006  . OSTEOARTHRITIS 08/28/2006  . OSTEOPOROSIS 08/28/2006    Talani Brazee PT, DPT 05/04/2014, 1:51 PM  [facility] [address] [place], [place], [address] Phone: [phone]   Fax:  [phone]

## 2014-05-05 ENCOUNTER — Encounter: Payer: Self-pay | Admitting: Physical Therapy

## 2014-05-05 ENCOUNTER — Ambulatory Visit: Payer: Medicare Other | Admitting: Physical Therapy

## 2014-05-05 DIAGNOSIS — R269 Unspecified abnormalities of gait and mobility: Secondary | ICD-10-CM | POA: Diagnosis not present

## 2014-05-05 DIAGNOSIS — M2550 Pain in unspecified joint: Secondary | ICD-10-CM | POA: Diagnosis not present

## 2014-05-05 DIAGNOSIS — R531 Weakness: Secondary | ICD-10-CM

## 2014-05-05 DIAGNOSIS — R2689 Other abnormalities of gait and mobility: Secondary | ICD-10-CM

## 2014-05-05 DIAGNOSIS — R29818 Other symptoms and signs involving the nervous system: Secondary | ICD-10-CM | POA: Diagnosis not present

## 2014-05-05 NOTE — Patient Instructions (Signed)

## 2014-05-05 NOTE — Therapy (Signed)
[facility] [address] [place], [place], [address] Phone: [phone]   Fax:  [phone]  Physical Therapy Treatment  Patient Details  Name: Krystal Clarke MRN: [id number] Date of Birth: 06/02/1935 Referring Provider:  Vidal Schwalbe, MD  Encounter Date: 05/05/2014      PT End of Session - 05/05/14 0935    Visit Number 5   Number of Visits 9   PT Start Time 0932   PT Stop Time 1013   PT Time Calculation (min) 41 min   Equipment Utilized During Treatment Gait belt   Activity Tolerance Patient tolerated treatment well   Behavior During Therapy [facility] for tasks assessed/performed      Past Medical History  Diagnosis Date  . Hypertension   . Peripheral neuropathy   . Sjogren's syndrome     bilateral vision  . Gait disorder 06/05/2012  . Migraines   . Paroxysmal atrial fibrillation 12/06/2012    Echo (12/07/12): Moderate focal basal hypertrophy of the septum, vigorous LV function, EF 65-70%, indeterminate diastolic function, normal wall motion, trivial MR, moderate LAE, trivial TR;  Xarelto started 11/2012  . Osteoarthritis   . Stroke   . Allergy     Past Surgical History  Procedure Laterality Date  . Shoulder open rotator cuff repair    . Lumbar laminectomy    . Abdominal hysterectomy    . Eye surgery      There were no vitals taken for this visit.  Visit Diagnosis:  Balance problems  Weakness generalized      Subjective Assessment - 05/05/14 0935    Symptoms No new complaints. No new falls or pain to report. Was able to do some of her exercises.   Pain Score 0-No pain     Self Care: Re-educated on fall prevention strategies and proper shoe wear for safety with mobility. Issued different information/handout that the one provided 2 visits ago. Pt did report she has since put a rubber mat in her tub after education was provided 2 sessions ago. Does still have some of her throw rugs down however. Has  plans to tape them. Pt currently wearing dress shoes with heel that are too big (visably falling of her feet). Pt educated on proper shoe wear for increased safety with mobility. Pt reluctant to give up heels, did say she understood the rationale, however likes her "low to the ground" heels.       Balance Exercises - 05/05/14 0936    OTAGO PROGRAM   Head Movements Sitting;5 reps   Neck Movements Sitting;5 reps   Back Extension Sitting;5 reps   Trunk Movements Sitting;5 reps  turns and reaches behind her   Ankle Movements Sitting;Other reps (comment)  20 times each foot   Knee Extensor 10 reps   Knee Flexor 10 reps   Hip ABductor 10 reps   Ankle Plantorflexors 20 reps, support   Ankle Dorsiflexors 20 reps, support   Knee Bends 10 reps, support   Backwards Walking Support   Walking and Turning Around Assistive device  counter top   Sideways Walking Assistive device   Tandem Stance 10 seconds, support   One Leg Stand 10 seconds, support   Sit to Stand 10 reps, bilateral support           PT Education - 05/06/14 1333    Education provided Yes   Education Details fall prevention/safety   Person(s) Educated Patient   Methods Explanation;Demonstration;Handout   Comprehension Verbalized understanding;Need  further instruction           PT Long Term Goals - 04/20/14 1741    PT LONG TERM GOAL #1   Title verbalizes understanding of Fall Prevention Strategies (Target Date: 05/20/14)   Time 4   Period Weeks   Status New   PT LONG TERM GOAL #2   Title demonstrates understanding of HEP (Target Date: 05/20/14)   Time 4   Period Weeks   Status New           Plan - 05/05/14 0935    Clinical Impression Statement Pt able to demo current OTAGO exercises with minimal correction cues. Added a few new ones today to complete her OTAGO program. No issued reported. Re-educated pt on fall prevention strategies (from 2 sessions ago). Educated pt on correct shoe wear for decreased fall  risk as well (pt wearing shoes that are too big for her and have a 1/2 inch heel on them today). Pt reluctantly agreed to look into different shoe wear for safety with mobility.                                                Pt will benefit from skilled therapeutic intervention in order to improve on the following deficits Abnormal gait;Decreased activity tolerance;Decreased balance;Decreased endurance;Decreased knowledge of use of DME;Decreased mobility;Decreased range of motion;Decreased strength;Impaired flexibility;Pain;Impaired vision/preception   Rehab Potential Good   Clinical Impairments Affecting Rehab Potential legally blind, multiple joint arthritis with pain limiting mobility,    PT Frequency 2x / week   PT Duration 4 weeks   PT Treatment/Interventions ADLs/Self Care Home Management;DME Instruction;Gait training;Functional mobility training;Therapeutic activities;Therapeutic exercise;Balance training;Neuromuscular re-education;Patient/family education   PT Next Visit Plan Gait safety with rollator, balance activities, vestibular activities   PT Home Exercise Plan [place] exercises   Consulted and Agree with Plan of Care Patient     Problem List Patient Active Problem List   Diagnosis Date Noted  . B12 deficiency 05/20/2013  . Dehydration with hyponatremia 12/07/2012  . Seizure disorder 12/07/2012  . Atrial fibrillation with RVR 12/06/2012  . Acute respiratory failure with hypoxia 12/06/2012  . Peripheral neuropathy   . Sjogren's syndrome   . Unspecified hereditary and idiopathic peripheral neuropathy 05/09/2012  . Localization-related (focal) (partial) epilepsy and epileptic syndromes with simple partial seizures, without mention of intractable epilepsy 05/09/2012  . ANEMIA, B12 DEFICIENCY 07/19/2009  . SHINGLES, HX OF 07/17/2009  . ANXIETY STATE, UNSPECIFIED 01/27/2009  . ABDOMINAL PAIN OTHER SPECIFIED SITE 01/27/2009  . DIVERTICULITIS, HX OF 01/27/2009  . SLEEP APNEA  12/19/2008  . MIXED HYPERLIPIDEMIA 07/18/2008  . HYPERTRIGLYCERIDEMIA 08/28/2006  . MIGRAINE HEADACHE 08/28/2006  . Essential hypertension 08/28/2006  . GERD 08/28/2006  . PSORIASIS 08/28/2006  . OSTEOARTHRITIS 08/28/2006  . OSTEOPOROSIS 08/28/2006    Willow Ora 05/06/2014, 1:37 PM  Willow Ora, PTA, [facility] [address], [place], [place] [address] [phone] 05/06/2014, 1:38 PM

## 2014-05-09 ENCOUNTER — Ambulatory Visit (INDEPENDENT_AMBULATORY_CARE_PROVIDER_SITE_OTHER): Payer: Medicare Other | Admitting: *Deleted

## 2014-05-09 DIAGNOSIS — E538 Deficiency of other specified B group vitamins: Secondary | ICD-10-CM

## 2014-05-09 MED ORDER — CYANOCOBALAMIN 1000 MCG/ML IJ SOLN
1000.0000 ug | Freq: Once | INTRAMUSCULAR | Status: AC
  Administered 2014-05-09: 1000 ug via INTRAMUSCULAR

## 2014-05-09 NOTE — Progress Notes (Signed)
Patient in for B12 injection. 

## 2014-05-23 DIAGNOSIS — R296 Repeated falls: Secondary | ICD-10-CM | POA: Diagnosis not present

## 2014-05-23 DIAGNOSIS — G4730 Sleep apnea, unspecified: Secondary | ICD-10-CM | POA: Diagnosis not present

## 2014-05-27 ENCOUNTER — Other Ambulatory Visit: Payer: Self-pay

## 2014-05-27 DIAGNOSIS — I4891 Unspecified atrial fibrillation: Secondary | ICD-10-CM

## 2014-05-27 DIAGNOSIS — I10 Essential (primary) hypertension: Secondary | ICD-10-CM

## 2014-05-27 MED ORDER — DILTIAZEM HCL ER COATED BEADS 120 MG PO CP24: 120.0000 mg | ORAL_CAPSULE | Freq: Every day | ORAL | Status: DC

## 2014-06-03 ENCOUNTER — Ambulatory Visit: Payer: Medicare Other | Admitting: Neurology

## 2014-06-05 DIAGNOSIS — R110 Nausea: Secondary | ICD-10-CM | POA: Diagnosis not present

## 2014-06-16 DIAGNOSIS — Z9889 Other specified postprocedural states: Secondary | ICD-10-CM | POA: Diagnosis not present

## 2014-06-16 DIAGNOSIS — Z961 Presence of intraocular lens: Secondary | ICD-10-CM | POA: Diagnosis not present

## 2014-06-16 DIAGNOSIS — M3500 Sicca syndrome, unspecified: Secondary | ICD-10-CM | POA: Diagnosis not present

## 2014-06-17 ENCOUNTER — Encounter: Payer: Self-pay | Admitting: Neurology

## 2014-06-17 ENCOUNTER — Ambulatory Visit (INDEPENDENT_AMBULATORY_CARE_PROVIDER_SITE_OTHER): Payer: Medicare Other | Admitting: Neurology

## 2014-06-17 VITALS — BP 114/70 | HR 66 | Ht 61.0 in | Wt 186.4 lb

## 2014-06-17 DIAGNOSIS — R269 Unspecified abnormalities of gait and mobility: Secondary | ICD-10-CM

## 2014-06-17 DIAGNOSIS — M3509 Sicca syndrome with other organ involvement: Secondary | ICD-10-CM | POA: Diagnosis not present

## 2014-06-17 DIAGNOSIS — G63 Polyneuropathy in diseases classified elsewhere: Secondary | ICD-10-CM

## 2014-06-17 DIAGNOSIS — E538 Deficiency of other specified B group vitamins: Secondary | ICD-10-CM | POA: Diagnosis not present

## 2014-06-17 DIAGNOSIS — M3506 Sjogren syndrome with peripheral nervous system involvement: Secondary | ICD-10-CM

## 2014-06-17 MED ORDER — CYANOCOBALAMIN 1000 MCG/ML IJ SOLN
1000.0000 ug | Freq: Once | INTRAMUSCULAR | Status: AC
  Administered 2014-06-17: 1000 ug via INTRAMUSCULAR

## 2014-06-17 NOTE — Progress Notes (Signed)
Patient in for B12 injection. 

## 2014-06-17 NOTE — Progress Notes (Signed)
Follow-up Visit   Date: 06/17/2014    Krystal Clarke MRN: [id number] DOB: 09-11-1935   [name] is a delightful 79 y.o. right-handed Caucasian female with history of history of hypertension, peripheral neuropathy, Sjogren's syndrome (diagnosed 2012), migraines, atrial fibrillation (on apixiban), history of shingles affecting left T6 (07/2009) and stroke (September 2014, mild word-finding difficulty) returning to the clinic for follow-up of headaches, falls, and peripheral neuropathy.    History of present illness: She has been under the care of Dr. Erling Cruz at [facility] for a over 30 years. She has several neurological problems including migraines, stroke, peripheral neuropathy, and gait instability, but her primary problem is headaches. She has a long history of migraines or started at the age of 29. Her headaches were well controlled on Depakote 250 twice a day. However, it was recently stopped due to low platelets and starting anticoagulation. She complains of right-sided throbbing and pulsating pain.  She applies heat and ice and takes tylenol which does help sometimes. She has nausea, photophobia, and phonophobia. She used to have vomiting, but does not have this any more. Fives headache free days a month. Previously tried medication for headaches is depakote, Botox (2 trials without benefit) and maxalt. Being on apixiban, she is unable to take NSAIDs due to bleeding risk.   Unfortunately, she was admitted to the hospital in September 2014 for community-acquired pneumonia and found to have new onset atrial fibrillation. Her anticoagulation was switched from [place] to [place] (followed by Dr. Stanford Breed). While she was hospitalized, her platelets were found to be slightly low (130), and since she was also on a blood thinner, depakote was stopped. She reports worsening of migraines since Depakote was stopped and is currently a not on any preventative medication. She was also  taking Maxalt which helped, but due to her recent stroke this is contraindicated. Due to worsening headaches, MRI of the brain was obtained in October which showed a small left thalamic subacute stroke.   - Follow-up 05/20/2013: No benefit with flexeril for headaches.  After a short course of steroids, her headache significant improved. She gets low-grade (4/10) which responds to excedrin migraine (1 pill).  She reports feeling about 80-90% better.  At her last visit, she was found to have vitamin B12 deficiency and was started on injections.  Energy level is also much improved after started vitamin b12 injections.    - Follow-up 11/30/2013:  She is doing well, but still has daily dull headache lasting 4-6 hours, no migraine in the past 21-months.  She had eye surgery at [facility], but reports worsening vision since then.   - Follow-up 03/04/2014:  She feels that her neuropathy is worse because her right leg feels like it is in a "boot" all the time.  She unfortunately had a accident where she dropped a gallon on tea on her left foot and has been wearing a soft brace because of the swelling.  She continues to complain of a very dull low-grade daily headache lasting about 3 hours in the afternoon.  No migraines.  She feels as if she may have had TIA a few weeks ago because she has brief word-findings problems, but did not seek attention.    - UPDATE 06/17/2014:  She reports having several falls since the beginning of the year when she was not using a walker. She had an interval fall in bedroom when trying to sit on the chair and missed it. She was unable to  stand so called fire department helped her back to her chair, but she did not need to go to the hospital.  Falls seem to mostly occur at home when using her cane, no falls when using her walker.  Her family has now moved her furniture so that she is better able use her walker in the home and she has noticed that walker is more stable and faster with it.  She  no longer has any migraines since increasing Lyrica.  She continues to get low-grade headaches about 2-3 times per week.    Medications:  Current Outpatient Prescriptions on File Prior to Visit  Medication Sig Dispense Refill  . apixaban (ELIQUIS) 5 MG TABS tablet Take 1 tablet (5 mg total) by mouth 2 (two) times daily. 180 tablet 4  . clobetasol ointment (TEMOVATE) 0.05 %     . cyanocobalamin (,VITAMIN B-12,) 1000 MCG/ML injection Inject 1 mL (1,000 mcg total) into the muscle once. 1 mL 0  . cyclobenzaprine (FLEXERIL) 5 MG tablet TAKE 1 TABLET (5 MG TOTAL) BY MOUTH AT BEDTIME. 90 tablet 0  . diltiazem (CARDIZEM CD) 120 MG 24 hr capsule Take 1 capsule (120 mg total) by mouth daily. 30 capsule 2  . dorzolamide-timolol (COSOPT) 22.3-6.8 MG/ML ophthalmic solution Place 1 drop into both eyes 2 (two) times daily.     . fenofibrate (TRICOR) 145 MG tablet Take 145 mg by mouth daily.    Marland Kitchen FLUZONE HIGH-DOSE 0.5 ML SUSY   0  . furosemide (LASIX) 20 MG tablet One tablet daily as needed for swelling 90 tablet 3  . LORazepam (ATIVAN) 1 MG tablet Take 1 mg by mouth at bedtime as needed.   1  . omeprazole-sodium bicarbonate (ZEGERID) 40-1100 MG per capsule     . ondansetron (ZOFRAN) 4 MG tablet TAKE 1 TABLET (4 MG TOTAL) BY MOUTH EVERY 8 (EIGHT) HOURS AS NEEDED FOR NAUSEA OR VOMITING. 90 tablet 0  . PARoxetine (PAXIL) 10 MG tablet     . pilocarpine (SALAGEN) 5 MG tablet Take 5 mg by mouth 3 (three) times daily.    . pregabalin (LYRICA) 150 MG capsule Take one tablet at bedtime and two tablet at bedtime. (Patient taking differently: Take one tablet in the morning and two tablets at bedtime.) 240 capsule 3  . sucralfate (CARAFATE) 1 G tablet Take 1 g by mouth 4 (four) times daily.     . traMADol (ULTRAM) 50 MG tablet     . Vitamin D, Ergocalciferol, (DRISDOL) 50000 UNITS CAPS Take 50,000 Units by mouth every 7 (seven) days. Does not remember what day of the week she takes     No current facility-administered  medications on file prior to visit.    Allergies:  Allergies  Allergen Reactions  . Codeine Nausea Only  . Hydrocodone Nausea And Vomiting  . Morphine And Related Nausea Only  . Oxycodone-Aspirin     REACTION: DEATHLY SICK-VOMITTING     Review of Systems:  CONSTITUTIONAL: No fevers, chills, night sweats, or weight loss.   EYES: +visual changes or eye pain ENT: No hearing changes.  No history of nose bleeds.   RESPIRATORY: No cough, wheezing and shortness of breath.   CARDIOVASCULAR: Negative for chest pain, and palpitations.   GI: Negative for abdominal discomfort, blood in stools or black stools.  No recent change in bowel habits.   GU:  No history of incontinence.   MUSCLOSKELETAL: No history of joint pain or swelling.  No myalgias.   SKIN: +for  lesions, rash, and itching.   ENDOCRINE: Negative for cold or heat intolerance, polydipsia or goiter.   PSYCH:  No depression or anxiety symptoms.   NEURO: As Above.   Vital Signs:  BP 114/70 mmHg  Pulse 66  Ht 5\' 1"  (1.549 m)  Wt 186 lb 6 oz (84.539 kg)  BMI 35.23 kg/m2  SpO2 95%  Neurological Exam: MENTAL STATUS including orientation to time, place, person, recent and remote memory, attention span and concentration, language, and fund of knowledge is normal.  Speech is not dysarthric.  CRANIAL NERVES:  Severe visual impairment bilaterally, she is only able to see colors. Irregular pupils, but reactive to light.  Mild bilateral ptosis. Face is symmetric. Palate elevates symmetrically.  Tongue is midline.  Dry oral mucosa  MOTOR: Motor strength is 5/5 throughout, including distally.  Tone is normal.   COORDINATION/GAIT: Gait is moderately wide-based with small, short steps, slightly stooped posture and assisted with walker.   Data: Labs 12/17/2012: Na 134, K 4.4, Chloride 99, Ca 10.4, WBC 7.9, RBC 4.3, Hbg 12.2, Hct 36.9  MRI brain wo contrast 12/23/2012: Very small acute/ subacute left thalamic nonhemorrhagic infarct.  Prominent small vessel disease type changes. No intracranial hemorrhage. Mild atrophy without hydrocephalus.  Prior clinic notes from Dr. Rexene Alberts, previous neurological workup includes:  EEG 06/03/1995 and 08/31/2008: normal  CSF 1991: normal  Lab Results  Component Value Date   VITAMINB12 127* 04/15/2013   Lab Results  Component Value Date   TSH 0.834 12/06/2012     IMPRESSION/PLAN: 1. Migraines without aura, chronic daily headaches Previously controlled with VPA and triptans, but due to stroke, anticoagulation, and low platelets, VPA was stopped and triptans is contraindicated  She had significant benefit with trial of medrol dose pak, flexeril did not help Clinically doing much better since increasing Lyrica, which will be continued at 150mg  in the morning and 300mg  qhs.   2. Vitamin B12 deficiency  Continue B12 injections (monthly), will administer today  Recheck level at next visit  3.  Mixed large and small fiber sensorimotor peripheral neuropathy due to Sjogren's disease  Numbness involving the R > L legs with painful paresthesias of the lower legs  Per notes, EMG performed at [facility] in 1991 and shows axonal neuropathy.  Paresthesias much improved with Lyrica   3. Gait instability  Walks with a 4-wheeled rollator.  She has fallen three times in the past year, one while on abixaban, no interval falls.  Physical therapy performed 02/26/12 through 04/23/12  Risks and benefits of stroke prevention vs bleeding complication due to falls.  Patient expresses understanding and reports that she does not want another stroke, her mother died with a stroke and this is fearful to her.  She is aware that she is high fall risk and the potential for devastating intracranial bleed. I will keep her cardiologist updated of this. Strongly encouraged her to use a walker at all times and wheelchair for long distances  4. Sjogren's disease complicated by severe bilateral vision loss  Dependent for most  of her ADLs on her husband. She is able to bathe and dress herself.   5. Left thalamic stroke, likely cardioembolic, still with mild word-finding problems on apixiban  6. Age-related memory loss  Neuropsychological testing 05/25/2010: Decreased verbal fluency, otherwise normal exam.  7. Return to clinic in 1-months or sooner as needed  The duration of this appointment visit was 35 minutes of face-to-face time with the patient.  Greater than 50% of this time  was spent in counseling, explanation of diagnosis, planning of further management, and coordination of care.   Thank you for allowing me to participate in patient's care.  If I can answer any additional questions, I would be pleased to do so.    Sincerely,    Donika K. Posey Pronto, DO

## 2014-06-29 DIAGNOSIS — M3500 Sicca syndrome, unspecified: Secondary | ICD-10-CM | POA: Diagnosis not present

## 2014-06-29 DIAGNOSIS — H4011X3 Primary open-angle glaucoma, severe stage: Secondary | ICD-10-CM | POA: Diagnosis not present

## 2014-07-14 ENCOUNTER — Other Ambulatory Visit: Payer: Self-pay | Admitting: Neurology

## 2014-07-15 ENCOUNTER — Other Ambulatory Visit: Payer: Self-pay | Admitting: *Deleted

## 2014-07-15 MED ORDER — CYCLOBENZAPRINE HCL 5 MG PO TABS: ORAL_TABLET | ORAL | Status: DC

## 2014-07-15 NOTE — Telephone Encounter (Signed)
Rx sent 

## 2014-07-18 ENCOUNTER — Ambulatory Visit (INDEPENDENT_AMBULATORY_CARE_PROVIDER_SITE_OTHER): Payer: Medicare Other | Admitting: *Deleted

## 2014-07-18 DIAGNOSIS — E538 Deficiency of other specified B group vitamins: Secondary | ICD-10-CM

## 2014-07-18 MED ORDER — CYANOCOBALAMIN 1000 MCG/ML IJ SOLN
1000.0000 ug | Freq: Once | INTRAMUSCULAR | Status: AC
  Administered 2014-07-18: 1000 ug via INTRAMUSCULAR

## 2014-07-18 NOTE — Progress Notes (Signed)
Patient in for B12 injection. 

## 2014-08-02 DIAGNOSIS — M75102 Unspecified rotator cuff tear or rupture of left shoulder, not specified as traumatic: Secondary | ICD-10-CM | POA: Diagnosis not present

## 2014-08-02 DIAGNOSIS — M1711 Unilateral primary osteoarthritis, right knee: Secondary | ICD-10-CM | POA: Diagnosis not present

## 2014-08-04 ENCOUNTER — Other Ambulatory Visit: Payer: Self-pay

## 2014-08-04 DIAGNOSIS — I4891 Unspecified atrial fibrillation: Secondary | ICD-10-CM

## 2014-08-04 DIAGNOSIS — I10 Essential (primary) hypertension: Secondary | ICD-10-CM

## 2014-08-04 MED ORDER — DILTIAZEM HCL ER COATED BEADS 120 MG PO CP24: 120.0000 mg | ORAL_CAPSULE | Freq: Every day | ORAL | Status: DC

## 2014-08-18 ENCOUNTER — Ambulatory Visit (INDEPENDENT_AMBULATORY_CARE_PROVIDER_SITE_OTHER): Payer: Medicare Other | Admitting: *Deleted

## 2014-08-18 DIAGNOSIS — E538 Deficiency of other specified B group vitamins: Secondary | ICD-10-CM

## 2014-08-18 MED ORDER — CYANOCOBALAMIN 1000 MCG/ML IJ SOLN
1000.0000 ug | Freq: Once | INTRAMUSCULAR | Status: AC
  Administered 2014-08-18: 1000 ug via INTRAMUSCULAR

## 2014-08-18 NOTE — Progress Notes (Signed)
Patient in for B12 injection. 

## 2014-08-24 DIAGNOSIS — K219 Gastro-esophageal reflux disease without esophagitis: Secondary | ICD-10-CM | POA: Diagnosis not present

## 2014-08-24 DIAGNOSIS — R296 Repeated falls: Secondary | ICD-10-CM | POA: Diagnosis not present

## 2014-08-24 DIAGNOSIS — H548 Legal blindness, as defined in USA: Secondary | ICD-10-CM | POA: Diagnosis not present

## 2014-09-07 DIAGNOSIS — H4011X3 Primary open-angle glaucoma, severe stage: Secondary | ICD-10-CM | POA: Diagnosis not present

## 2014-09-07 DIAGNOSIS — Z9889 Other specified postprocedural states: Secondary | ICD-10-CM | POA: Diagnosis not present

## 2014-09-15 DIAGNOSIS — B370 Candidal stomatitis: Secondary | ICD-10-CM | POA: Diagnosis not present

## 2014-09-22 ENCOUNTER — Ambulatory Visit (INDEPENDENT_AMBULATORY_CARE_PROVIDER_SITE_OTHER): Payer: Medicare Other | Admitting: Family Medicine

## 2014-09-22 DIAGNOSIS — E538 Deficiency of other specified B group vitamins: Secondary | ICD-10-CM | POA: Diagnosis not present

## 2014-09-22 MED ORDER — CYANOCOBALAMIN 1000 MCG/ML IJ SOLN
1000.0000 ug | Freq: Once | INTRAMUSCULAR | Status: AC
  Administered 2014-09-22: 1000 ug via INTRAMUSCULAR

## 2014-10-03 ENCOUNTER — Ambulatory Visit (INDEPENDENT_AMBULATORY_CARE_PROVIDER_SITE_OTHER): Payer: Medicare Other

## 2014-10-03 ENCOUNTER — Ambulatory Visit (INDEPENDENT_AMBULATORY_CARE_PROVIDER_SITE_OTHER): Payer: Medicare Other | Admitting: Family Medicine

## 2014-10-03 VITALS — BP 124/72 | HR 73 | Temp 98.4°F | Resp 18 | Ht 62.5 in | Wt 193.2 lb

## 2014-10-03 DIAGNOSIS — M79645 Pain in left finger(s): Secondary | ICD-10-CM

## 2014-10-03 DIAGNOSIS — R296 Repeated falls: Secondary | ICD-10-CM

## 2014-10-03 DIAGNOSIS — T148 Other injury of unspecified body region: Secondary | ICD-10-CM

## 2014-10-03 DIAGNOSIS — D509 Iron deficiency anemia, unspecified: Secondary | ICD-10-CM

## 2014-10-03 LAB — POCT CBC
Granulocyte percent: 71.5 %G (ref 37–80)
HCT, POC: 33.9 % — AB (ref 37.7–47.9)
HEMOGLOBIN: 10.5 g/dL — AB (ref 12.2–16.2)
LYMPH, POC: 3 (ref 0.6–3.4)
MCH: 21.2 pg — AB (ref 27–31.2)
MCHC: 30.9 g/dL — AB (ref 31.8–35.4)
MCV: 68.5 fL — AB (ref 80–97)
MID (cbc): 0.8 (ref 0–0.9)
MPV: 6.8 fL (ref 0–99.8)
POC Granulocyte: 9.6 — AB (ref 2–6.9)
POC LYMPH PERCENT: 22.5 %L (ref 10–50)
POC MID %: 6 %M (ref 0–12)
Platelet Count, POC: 315 10*3/uL (ref 142–424)
RBC: 4.95 M/uL (ref 4.04–5.48)
RDW, POC: 19.2 %
WBC: 13.4 10*3/uL — AB (ref 4.6–10.2)

## 2014-10-03 NOTE — Patient Instructions (Signed)
The finger should clear up in the next couple of weeks. Return if you have any concern that the discoloration is extending on up the hand or into other fingers.  You have anemia which is probably caused by iron deficiency. I will let you know for certain after I get the results of the iron level. I would advise that you take iron pills 1 pill twice daily for about 1 month, then 1 pill once daily for a couple of months to rebuild your bone marrow iron stores. If you cannot take it twice a day, at least take it once a day.  Let your primary care physician, Dr. Dema Severin, know that your blood count was low and she may decide to do some other tests on you to be certain you're not losing any blood.

## 2014-10-03 NOTE — Progress Notes (Signed)
  Subjective:  Patient ID: Krystal Clarke, female    DOB: October 04, 1935  Age: 79 y.o. MRN: [id number]  79 y.o. spontaneous ecchymosis developed on her right fourth finger on Friday. It was swollen a lot. Some pain.. She has a history of a lot of falls, but it is been about 2 weeks that she had fallen. She has fallen 9 times in this year. She apparently just is very unsteady from her peripheral neuropathy.   Objective:   Pleasant alert lady in no major distress. Right fourth finger is ecchymotic all the way from the hand down. The capillary refill in the nail bed is good. Nontender. She is on anticoagulation therapy.  [facility] reading (PRIMARY) by  Dr. Linna Darner Normal finger  Results for orders placed or performed in visit on 10/03/14  POCT CBC  Result Value Ref Range   WBC 13.4 (A) 4.6 - 10.2 K/uL   Lymph, poc 3.0 0.6 - 3.4   POC LYMPH PERCENT 22.5 10 - 50 %L   MID (cbc) 0.8 0 - 0.9   POC MID % 6.0 0 - 12 %M   POC Granulocyte 9.6 (A) 2 - 6.9   Granulocyte percent 71.5 37 - 80 %G   RBC 4.95 4.04 - 5.48 M/uL   Hemoglobin 10.5 (A) 12.2 - 16.2 g/dL   HCT, POC 33.9 (A) 37.7 - 47.9 %   MCV 68.5 (A) 80 - 97 fL   MCH, POC 21.2 (A) 27 - 31.2 pg   MCHC 30.9 (A) 31.8 - 35.4 g/dL   RDW, POC 19.2 %   Platelet Count, POC 315 142 - 424 K/uL   MPV 6.8 0 - 99.8 fL   .    Assessment & Plan:   Assessment: Spontaneous ecchymosis of finger.  Finger pain and swelling.  History of falls.  Plan: CBC and x-ray fourth finger  Discussed with the patient that she is anemic, and she needs to discuss this with her primary care. She is on blood thinner, and there is the concern that she could be losing a little blood somewhere. It is not gone down much over the last 5 months, but may need attention. Patient Instructions  The finger should clear up in the next couple of weeks. Return if you have any concern that the discoloration is extending on up the hand or into other fingers.  You have anemia which is  probably caused by iron deficiency. I will let you know for certain after I get the results of the iron level. I would advise that you take iron pills 1 pill twice daily for about 1 month, then 1 pill once daily for a couple of months to rebuild your bone marrow iron stores. If you cannot take it twice a day, at least take it once a day.  Let your primary care physician, Dr. Dema Severin, know that your blood count was low and she may decide to do some other tests on you to be certain you're not losing any blood.     Racheal Mathurin, MD 10/03/2014

## 2014-10-04 LAB — IRON AND TIBC
%SAT: 4 % — ABNORMAL LOW (ref 20–55)
Iron: 17 ug/dL — ABNORMAL LOW (ref 42–145)
TIBC: 475 ug/dL — AB (ref 250–470)
UIBC: 458 ug/dL — ABNORMAL HIGH (ref 125–400)

## 2014-10-05 ENCOUNTER — Encounter: Payer: Self-pay | Admitting: Family Medicine

## 2014-10-07 ENCOUNTER — Ambulatory Visit: Payer: Medicare Other | Admitting: Neurology

## 2014-10-10 DIAGNOSIS — M75102 Unspecified rotator cuff tear or rupture of left shoulder, not specified as traumatic: Secondary | ICD-10-CM | POA: Diagnosis not present

## 2014-10-10 DIAGNOSIS — M1711 Unilateral primary osteoarthritis, right knee: Secondary | ICD-10-CM | POA: Diagnosis not present

## 2014-10-10 DIAGNOSIS — M3500 Sicca syndrome, unspecified: Secondary | ICD-10-CM | POA: Diagnosis not present

## 2014-10-10 DIAGNOSIS — H4011X3 Primary open-angle glaucoma, severe stage: Secondary | ICD-10-CM | POA: Diagnosis not present

## 2014-10-17 DIAGNOSIS — Z9889 Other specified postprocedural states: Secondary | ICD-10-CM | POA: Diagnosis not present

## 2014-10-17 DIAGNOSIS — H4011X3 Primary open-angle glaucoma, severe stage: Secondary | ICD-10-CM | POA: Diagnosis not present

## 2014-10-20 ENCOUNTER — Emergency Department (HOSPITAL_COMMUNITY): Payer: Medicare Other

## 2014-10-20 ENCOUNTER — Encounter (HOSPITAL_COMMUNITY): Payer: Self-pay | Admitting: *Deleted

## 2014-10-20 ENCOUNTER — Telehealth: Payer: Self-pay | Admitting: Cardiology

## 2014-10-20 ENCOUNTER — Emergency Department (HOSPITAL_COMMUNITY)
Admission: EM | Admit: 2014-10-20 | Discharge: 2014-10-20 | Disposition: A | Discharge status: Home / Self Care | Payer: Medicare Other | Attending: Emergency Medicine | Admitting: Emergency Medicine

## 2014-10-20 DIAGNOSIS — R42 Dizziness and giddiness: Secondary | ICD-10-CM

## 2014-10-20 DIAGNOSIS — R0789 Other chest pain: Secondary | ICD-10-CM | POA: Diagnosis not present

## 2014-10-20 DIAGNOSIS — R61 Generalized hyperhidrosis: Secondary | ICD-10-CM | POA: Insufficient documentation

## 2014-10-20 DIAGNOSIS — Z79899 Other long term (current) drug therapy: Secondary | ICD-10-CM | POA: Diagnosis not present

## 2014-10-20 DIAGNOSIS — I480 Paroxysmal atrial fibrillation: Secondary | ICD-10-CM | POA: Insufficient documentation

## 2014-10-20 DIAGNOSIS — M1990 Unspecified osteoarthritis, unspecified site: Secondary | ICD-10-CM | POA: Insufficient documentation

## 2014-10-20 DIAGNOSIS — M3500 Sicca syndrome, unspecified: Secondary | ICD-10-CM

## 2014-10-20 DIAGNOSIS — I10 Essential (primary) hypertension: Secondary | ICD-10-CM | POA: Diagnosis not present

## 2014-10-20 DIAGNOSIS — R079 Chest pain, unspecified: Secondary | ICD-10-CM | POA: Insufficient documentation

## 2014-10-20 DIAGNOSIS — Z7901 Long term (current) use of anticoagulants: Secondary | ICD-10-CM | POA: Diagnosis not present

## 2014-10-20 DIAGNOSIS — Z8673 Personal history of transient ischemic attack (TIA), and cerebral infarction without residual deficits: Secondary | ICD-10-CM | POA: Diagnosis not present

## 2014-10-20 DIAGNOSIS — I499 Cardiac arrhythmia, unspecified: Secondary | ICD-10-CM | POA: Insufficient documentation

## 2014-10-20 DIAGNOSIS — Z87891 Personal history of nicotine dependence: Secondary | ICD-10-CM | POA: Diagnosis not present

## 2014-10-20 DIAGNOSIS — R109 Unspecified abdominal pain: Secondary | ICD-10-CM | POA: Diagnosis not present

## 2014-10-20 DIAGNOSIS — G43909 Migraine, unspecified, not intractable, without status migrainosus: Secondary | ICD-10-CM | POA: Diagnosis not present

## 2014-10-20 DIAGNOSIS — D72829 Elevated white blood cell count, unspecified: Secondary | ICD-10-CM | POA: Diagnosis not present

## 2014-10-20 DIAGNOSIS — G629 Polyneuropathy, unspecified: Secondary | ICD-10-CM | POA: Insufficient documentation

## 2014-10-20 DIAGNOSIS — IMO0001 Reserved for inherently not codable concepts without codable children: Secondary | ICD-10-CM

## 2014-10-20 LAB — CBC
HCT: 44.4 % (ref 36.0–46.0)
Hemoglobin: 13.2 g/dL (ref 12.0–15.0)
MCH: 22.7 pg — ABNORMAL LOW (ref 26.0–34.0)
MCHC: 29.7 g/dL — ABNORMAL LOW (ref 30.0–36.0)
MCV: 76.4 fL — ABNORMAL LOW (ref 78.0–100.0)
Platelets: 289 10*3/uL (ref 150–400)
RBC: 5.81 MIL/uL — ABNORMAL HIGH (ref 3.87–5.11)
RDW: 22.6 % — AB (ref 11.5–15.5)
WBC: 18.1 10*3/uL — AB (ref 4.0–10.5)

## 2014-10-20 LAB — URINE MICROSCOPIC-ADD ON

## 2014-10-20 LAB — BASIC METABOLIC PANEL
ANION GAP: 10 (ref 5–15)
BUN: 18 mg/dL (ref 6–20)
CALCIUM: 10.5 mg/dL — AB (ref 8.9–10.3)
CO2: 29 mmol/L (ref 22–32)
CREATININE: 0.89 mg/dL (ref 0.44–1.00)
Chloride: 100 mmol/L — ABNORMAL LOW (ref 101–111)
GLUCOSE: 140 mg/dL — AB (ref 65–99)
POTASSIUM: 4.3 mmol/L (ref 3.5–5.1)
SODIUM: 139 mmol/L (ref 135–145)

## 2014-10-20 LAB — I-STAT TROPONIN, ED: Troponin i, poc: 0.01 ng/mL (ref 0.00–0.08)

## 2014-10-20 LAB — URINALYSIS, ROUTINE W REFLEX MICROSCOPIC
GLUCOSE, UA: NEGATIVE mg/dL
HGB URINE DIPSTICK: NEGATIVE
KETONES UR: 15 mg/dL — AB
Nitrite: NEGATIVE
Protein, ur: 100 mg/dL — AB
Specific Gravity, Urine: 1.026 (ref 1.005–1.030)
Urobilinogen, UA: 1 mg/dL (ref 0.0–1.0)
pH: 5.5 (ref 5.0–8.0)

## 2014-10-20 LAB — TROPONIN I

## 2014-10-20 MED ORDER — SODIUM CHLORIDE 0.9 % IV BOLUS (SEPSIS)
500.0000 mL | Freq: Once | INTRAVENOUS | Status: AC
  Administered 2014-10-20: 500 mL via INTRAVENOUS

## 2014-10-20 MED ORDER — ASPIRIN 81 MG PO CHEW
324.0000 mg | CHEWABLE_TABLET | Freq: Once | ORAL | Status: AC
  Administered 2014-10-20: 324 mg via ORAL
  Filled 2014-10-20: qty 4

## 2014-10-20 NOTE — ED Notes (Signed)
MD at bedside.[employer]

## 2014-10-20 NOTE — Consult Note (Signed)
Admit date: 10/20/2014 Referring Physician  Dr. Regenia Skeeter Primary Physician Vidal Schwalbe, MD Primary Cardiologist  Dr. Stanford Breed Reason for Consultation  Chest and jaw pain last two days, atrial fibrillation paroxysmal  HPI: 79 year old female with paroxysmal atrial fibrillation hypertension, history of disequilibrium, Sjogren's syndrome who called to our cardiology clinic stating that she had been having chest and jaw pain over the past 2 days. She was sent to the emergency department for further evaluation. She has been reporting shortness of breath, nausea, dizziness which has been quite long-standing, she has fallen twice. In review of Dr. Shanon Brow Hopper's note from 10/03/14, disequilibrium has been quite an issue for her.  Thankfully, point-of-care troponin is 0.01, normal, electrolytes are normal. Her white blood cell count is 18.1 and elevated from previously checked 13. Hemoglobin 13.2. Chest x-ray is unremarkable. EKG shows atrial fibrillation with heart rate of 101 bpm with nonspecific ST-T wave changes. Prior EKG on 04/21/14 showed sinus rhythm.  Echocardiogram 12/07/12: Moderate basal septal hypertrophy - sigmoid shaped septum, LVEF 65-70%. Indeterminate diastolic function in the setting of atrial fibrillation, suspect increased filling pressures with moderate left atrial enlargement. MAC with trivial mitral regurgitation. Unable to assess PASP, CVP estimated at 8 mm mercury.   PMH:   Past Medical History  Diagnosis Date  . Hypertension   . Peripheral neuropathy   . Sjogren's syndrome     bilateral vision  . Gait disorder 06/05/2012  . Migraines   . Paroxysmal atrial fibrillation 12/06/2012    Echo (12/07/12): Moderate focal basal hypertrophy of the septum, vigorous LV function, EF 65-70%, indeterminate diastolic function, normal wall motion, trivial MR, moderate LAE, trivial TR;  Xarelto started 11/2012  . Osteoarthritis   . Stroke   . Allergy     PSH:   Past  Surgical History  Procedure Laterality Date  . Shoulder open rotator cuff repair    . Lumbar laminectomy    . Abdominal hysterectomy    . Eye surgery     Allergies:  Codeine; Hydrocodone; Morphine and related; and Oxycodone-aspirin Prior to Admit Meds:   Prior to Admission medications   Medication Sig Start Date End Date Taking? Authorizing Provider  ondansetron (ZOFRAN) 4 MG tablet TAKE 1 TABLET (4 MG TOTAL) BY MOUTH EVERY 8 (EIGHT) HOURS AS NEEDED FOR NAUSEA OR VOMITING. 10/11/13  Yes Donika K Patel, DO  predniSONE (STERAPRED UNI-PAK 21 TAB) 5 MG (21) TBPK tablet Take 5 mg by mouth See admin instructions. see package 10/14/14  Yes Historical Provider, MD  apixaban (ELIQUIS) 5 MG TABS tablet Take 1 tablet (5 mg total) by mouth 2 (two) times daily. 04/22/14   Lelon Perla, MD  clobetasol ointment (TEMOVATE) 0.05 %  09/25/13   Historical Provider, MD  cyanocobalamin (,VITAMIN B-12,) 1000 MCG/ML injection Inject 1 mL (1,000 mcg total) into the muscle once. 05/18/13   Donika Keith Rake, DO  cyclobenzaprine (FLEXERIL) 5 MG tablet TAKE 1 TABLET BY MOUTH AT BEDTIME 07/15/14   Donika K Patel, DO  cyclobenzaprine (FLEXERIL) 5 MG tablet TAKE 1 TABLET (5 MG TOTAL) BY MOUTH AT BEDTIME. 07/15/14   Donika K Patel, DO  diltiazem (CARDIZEM CD) 120 MG 24 hr capsule Take 1 capsule (120 mg total) by mouth daily. 08/04/14   Lelon Perla, MD  dorzolamide-timolol (COSOPT) 22.3-6.8 MG/ML ophthalmic solution Place 1 drop into both eyes 2 (two) times daily.     Historical Provider, MD  fenofibrate (TRICOR) 145 MG tablet Take 145 mg by mouth daily.  Historical Provider, MD  FLUZONE HIGH-DOSE 0.5 ML SUSY  12/21/13   Historical Provider, MD  furosemide (LASIX) 20 MG tablet One tablet daily as needed for swelling 09/01/13   Lelon Perla, MD  LORazepam (ATIVAN) 1 MG tablet Take 1 mg by mouth at bedtime as needed.  01/25/14   Historical Provider, MD  omeprazole-sodium bicarbonate (ZEGERID) 40-1100 MG per capsule  08/16/13    Historical Provider, MD  PARoxetine (PAXIL) 10 MG tablet  11/21/13   Historical Provider, MD  pilocarpine (SALAGEN) 5 MG tablet Take 5 mg by mouth 3 (three) times daily.    Historical Provider, MD  pregabalin (LYRICA) 150 MG capsule Take one tablet at bedtime and two tablet at bedtime. Patient taking differently: Take one tablet in the morning and two tablets at bedtime. 03/04/14   Donika Keith Rake, DO  sucralfate (CARAFATE) 1 G tablet Take 1 g by mouth 4 (four) times daily.  05/27/12   Historical Provider, MD  traMADol Veatrice Bourbon) 50 MG tablet  08/19/13   Historical Provider, MD  Vitamin D, Ergocalciferol, (DRISDOL) 50000 UNITS CAPS Take 50,000 Units by mouth every 7 (seven) days. Does not remember what day of the week she takes 05/14/12   Historical Provider, MD   Fam HX:    Family History  Problem Relation Age of Onset  . Stomach cancer Father     Deceased, 68  . Stroke Mother     Deceased, 62  . Sjogren's syndrome Father   . Sjogren's syndrome Brother   . Sjogren's syndrome Brother   . Scleroderma Sister   . Lymphoma Daughter     Deceased, 4  . Migraines Mother   . Migraines Daughter    Social HX:    History   Social History  . Marital Status: Married    Spouse Name: Denyse Amass  . Number of Children: 2  . Years of Education: college   Occupational History  . retired    Social History Main Topics  . Smoking status: Former Research scientist (life sciences)  . Smokeless tobacco: Never Used  . Alcohol Use: No  . Drug Use: No  . Sexual Activity: Not on file   Other Topics Concern  . Not on file   Social History Narrative   She lives with husband of 37 years.  They have a one story home.     ROS:  All 11 ROS were addressed and are negative except what is stated in the HPI   Physical Exam: Blood pressure 112/61, pulse 101, temperature 98.5 F (36.9 C), temperature source Oral, resp. rate 20, height 5\' 1"  (1.549 m), weight 186 lb (84.369 kg), SpO2 93 %.   General: Well developed, well nourished, elderly  in no acute distress Head: Eyes PERRLA, No xanthomas. Dry mouth  Normal cephalic and atramatic  Lungs:   Clear bilaterally to auscultation and percussion. Normal respiratory effort. No wheezes, no rales. Heart:   Irregularly irregular, normal rhythm Pulses are 2+ & equal. No murmur, rubs, gallops.  No carotid bruit. No JVD.  No abdominal bruits.  Abdomen: Bowel sounds are positive, abdomen soft and non-tender without masses. No hepatosplenomegaly. Msk:  Back normal. Normal strength and tone for age. Extremities:  No clubbing, cyanosis or edema.  DP +1 Neuro: Alert and oriented X 3, non-focal, MAE x 4 GU: Deferred Rectal: Deferred Psych:  Good affect, responds appropriately      Labs: Lab Results  Component Value Date   WBC 18.1* 10/20/2014   HGB 13.2 10/20/2014  HCT 44.4 10/20/2014   MCV 76.4* 10/20/2014   PLT 289 10/20/2014     Recent Labs Lab 10/20/14 1147  NA 139  K 4.3  CL 100*  CO2 29  BUN 18  CREATININE 0.89  CALCIUM 10.5*  GLUCOSE 140*   No results for input(s): CKTOTAL, CKMB, TROPONINI in the last 72 hours. Lab Results  Component Value Date   CHOL 129 07/17/2009   HDL 47.20 07/17/2009   LDLCALC 56 07/17/2009   TRIG 128.0 07/17/2009   No results found for: DDIMER   Radiology:  Dg Chest 2 View  10/20/2014   CLINICAL DATA:  Atrial fibrillation and chest pain  EXAM: CHEST  2 VIEW  COMPARISON:  12/31/2013  FINDINGS: No cardiomegaly. Stable aortic tortuosity. There is no edema, consolidation, effusion, or pneumothorax. No acute osseous findings.  IMPRESSION: No active cardiopulmonary disease.   Electronically Signed   By: Monte Fantasia M.D.   On: 10/20/2014 12:06   Personally viewed.  EKG:  Several EKGs reviewed as described above in history of present illness. Personally viewed.   ASSESSMENT/PLAN:    79 year old female with paroxysmal atrial fibrillation here with atrial fibrillation, chest pain/jaw pain for the past 2 days, shortness of breath,  leukocytosis, normal chest x-ray, prior EF normal. Currently chest pain-free.  1. Atrial fibrillation-paroxysmal  - long-standing Eliquis use.  - She states that usually once to twice a month she will feel her atrial fibrillation.  - Actually, currently she is in atrial fibrillation but is not aware at the present moment.  - Perhaps this is in some sense control leading to her constellation of symptoms.  - One could consider antiarhythmic therapy in the future.  2. Chest pain/jaw pain  - Thankfully, point-of-care troponin is normal especially in light of her 2 day history of chest pain. Checking a number serum troponin. If abnormal, admit.  - Would not be unreasonable to proceed with nuclear stress test as outpatient. EKG does not show any ischemic changes. She is currently chest pain free. When offered observation in the hospital, she respectfully asked if she could go home. I think that this is reasonable.  - I will have her follow-up in the clinic in the next few days, assess her status and consider nuclear stress test ordering at that time.  - There was not an exertional component to her chest discomfort.  3. Chronic anticoagulation  - On Eliquis, continue  4. Leukocytosis  - White blood cell count 18,000.  - No objective source of fever  - Checking urinalysis  - With concomitant nausea and some diaphoresis over the past 2 days, she could have a viral gastroenteritis. No diarrhea  - Continue to monitor.  5. Disequilibrium  - Long-standing issue. Falls have been noted. This concerns [place] given the use of anticoagulation. She has had 9 falls over the last several months.  Candee Furbish, MD  10/20/2014  12:37 PM

## 2014-10-20 NOTE — ED Notes (Signed)
Pt states that she has had chest and jaw pain for the last 2 days. Pt was sent here by her cardiologist for evaluation. Pt reports SOB, Nausea, and dizziness. Pt states that she has fell twice .

## 2014-10-20 NOTE — Discharge Instructions (Signed)
Chest Pain (Nonspecific) °It is often hard to give a specific diagnosis for the cause of chest pain. There is always a chance that your pain could be related to something serious, such as a heart attack or a blood clot in the lungs. You need to follow up with your health care provider for further evaluation. °CAUSES  °· Heartburn. °· Pneumonia or bronchitis. °· Anxiety or stress. °· Inflammation around your heart (pericarditis) or lung (pleuritis or pleurisy). °· A blood clot in the lung. °· A collapsed lung (pneumothorax). It can develop suddenly on its own (spontaneous pneumothorax) or from trauma to the chest. °· Shingles infection (herpes zoster virus). °The chest wall is composed of bones, muscles, and cartilage. Any of these can be the source of the pain. °· The bones can be bruised by injury. °· The muscles or cartilage can be strained by coughing or overwork. °· The cartilage can be affected by inflammation and become sore (costochondritis). °DIAGNOSIS  °Lab tests or other studies may be needed to find the cause of your pain. Your health care provider may have you take a test called an ambulatory electrocardiogram (ECG). An ECG records your heartbeat patterns over a 24-hour period. You may also have other tests, such as: °· Transthoracic echocardiogram (TTE). During echocardiography, sound waves are used to evaluate how blood flows through your heart. °· Transesophageal echocardiogram (TEE). °· Cardiac monitoring. This allows your health care provider to monitor your heart rate and rhythm in real time. °· Holter monitor. This is a portable device that records your heartbeat and can help diagnose heart arrhythmias. It allows your health care provider to track your heart activity for several days, if needed. °· Stress tests by exercise or by giving medicine that makes the heart beat faster. °TREATMENT  °· Treatment depends on what may be causing your chest pain. Treatment may include: °¨ Acid blockers for  heartburn. °¨ Anti-inflammatory medicine. °¨ Pain medicine for inflammatory conditions. °¨ Antibiotics if an infection is present. °· You may be advised to change lifestyle habits. This includes stopping smoking and avoiding alcohol, caffeine, and chocolate. °· You may be advised to keep your head raised (elevated) when sleeping. This reduces the chance of acid going backward from your stomach into your esophagus. °Most of the time, nonspecific chest pain will improve within 2-3 days with rest and mild pain medicine.  °HOME CARE INSTRUCTIONS  °· If antibiotics were prescribed, take them as directed. Finish them even if you start to feel better. °· For the next few days, avoid physical activities that bring on chest pain. Continue physical activities as directed. °· Do not use any tobacco products, including cigarettes, chewing tobacco, or electronic cigarettes. °· Avoid drinking alcohol. °· Only take medicine as directed by your health care provider. °· Follow your health care provider's suggestions for further testing if your chest pain does not go away. °· Keep any follow-up appointments you made. If you do not go to an appointment, you could develop lasting (chronic) problems with pain. If there is any problem keeping an appointment, call to reschedule. °SEEK MEDICAL CARE IF:  °· Your chest pain does not go away, even after treatment. °· You have a rash with blisters on your chest. °· You have a fever. °SEEK IMMEDIATE MEDICAL CARE IF:  °· You have increased chest pain or pain that spreads to your arm, neck, jaw, back, or abdomen. °· You have shortness of breath. °· You have an increasing cough, or you cough   up blood.  You have severe back or abdominal pain.  You feel nauseous or vomit.  You have severe weakness.  You faint.  You have chills. This is an emergency. Do not wait to see if the pain will go away. Get medical help at once. Call your local emergency services (911 in [place] Do not drive  yourself to the hospital. MAKE SURE YOU:   Understand these instructions.  Will watch your condition.  Will get help right away if you are not doing well or get worse. Document Released: 12/12/2004 Document Revised: 03/09/2013 Document Reviewed: 10/08/2007 [facility] Patient Information 2015 [place], [place]. This information is not intended to replace advice given to you by your health care provider. Make sure you discuss any questions you have with your health care provider.    Diaphoresis Sweating is controlled by our nervous system. Sweat glands are found in the skin throughout the body. They exist in higher numbers in the skin of the hands, feet, armpits, and the genital region. Sweating occurs normally when the temperature of your body goes up. Diaphoresis means profuse sweating or perspiration due to an underlying medical condition or an external factor (such as medicines). Hyperhidrosis means excessive sweating that is not usually due to an underlying medical condition, on areas such as the palms, soles, or armpits. Other areas of the body may also be affected. Hyperhidrosis usually begins in childhood or early adolescence. It increases in severity through puberty and into adulthood. Sweaty palms are the most common problem and the most bothersome to people who have hyperhidrosis. CAUSES  Sweating is normally seen with exercise or being in hot surroundings. Not sweating in these conditions would be harmful. Stressful situations can also cause sweating. In some people, stimulation of the sweat glands during stress is overactive. Talking to strangers or shaking someone's hand can produce profuse sweating. Causes of sweating include:  Emotional upset.  Low blood pressure.  Low blood sugar.  Heart problems.  Low blood cell counts.  Certain pain relieving medicines.  Exercise.  Alcohol.  Infection.  Caffeine.  Spicy foods.  Hot flashes.  Overactive thyroid.  Illegal drug  use, such as cocaine and amphetamine.  Use of medicines that stimulate parts of the nervous system.  A tumor (pheochromocytoma).  Withdrawal from some medicines or alcohol. DIAGNOSIS  Your caregiver needs to be consulted to make sure excessive sweating is not caused by another condition. Further testing may need to be done. TREATMENT   When hyperhidrosis is caused by another condition, that condition should be treated.  If menopause is the cause, you may wish to talk to your caregiver about estrogen replacement.  If the hyperhidrosis is a natural happening of the way your body works, certain antiperspirants may help.  If medicines do not work, injections of botulinum toxin type A are sometimes used for underarm sweating.  Your caregiver can usually help you with this problem. Document Released: 09/24/2004 Document Revised: 05/27/2011 Document Reviewed: 04/11/2008 [facility] Patient Information 2015 [place], [place]. This information is not intended to replace advice given to you by your health care provider. Make sure you discuss any questions you have with your health care provider.  Adenosine Stress Electrocardiography An adenosine stress electrocardiography is a test used to detect heart disease (coronary artery disease). Adenosine is a medicine that makes the heart arteries react as if you are exercising. Adenosine is given with a radioactive tracer. The "tracer" is a safe radioactive substance that travels in the bloodstream to the heart  arteries. Special imaging cameras detect the tracer and help find blocked arteries in the heart. This test may be done with or without treadmill exercise.  LET [facility] CARE PROVIDER KNOW ABOUT:  Allergies, including latex allergies.  All prescription medicines you taking as well as all non-prescription and over-the-counter medicines, including herbs and vitamins.  Use of steroids (by mouth or creams).  Previous problems with anesthetics or  novocaine.  History of blood clots or bleeding problems.  Previous surgery.  Other health problems such as kidney or lung conditions.  Possibility of pregnancy, if this applies. RISKS AND COMPLICATIONS You may develop chest discomfort, shortness of breath, sweating, or light-headedness during the test. On rare occasions, you could experience a heart attack or your heart may go into a very fast or irregular rhythm. This could cause you to collapse. To ensure your safety, your health care provider will supervise the test. Your blood pressure and electrocardiogram are constantly watched. The test team watches for and is able to treat any problems. BEFORE THE PROCEDURE  Do not eat or drink caffeine for 12 to 24 hours before the test. This includes all caffeinated beverages and food, such as pop, coffee (roasted, instant, decaffeinated roasted, decaffeinated instant), hot chocolate, tea, and all chocolate.  Do not smoke on the day of your test. Smoking on the day of your test may change your test results.  Do not eat anything 3 hours before the test or as recommended by your health care provider. Eating may cause an unclear image and may also cause nausea. If you are diabetic, talk to your health care provider regarding your insulin coverage.  Bring a list of all the medicines you are taking. Take your medicine as usual before the test except as told by the testing center.  Wear comfortable clothing, such as a short-sleeve shirt and sweatpants. Do not wear an underwire bra or jewelry. A hospital gown can be provided.  Shower before your appointment to reduce the spread of bacteria.  You may want to bring a book to read because there are some waiting periods during the test.  Your health care provider will go over the adenosine stress test with you, such as procedure protocol, what to expect, how long it will take, and results. PROCEDURE   An IV will be started in a vein in your hand or  arm.  Electrode patches will be placed on your chest. The electrodes are connected to a monitor so your heart rhythm and heart rate can be watched. Your blood pressure will also be monitored during the test.  Two sets of images are usually taken of your heart. The images compare your heart at rest and when it is "stressed." This first image is a "resting" picture of your heart. The "resting" image is usually done before adenosine is given.  Adenosine is given in the IV over a period of 4 to 6 minutes.  After the adenosine is given, you will be monitored for a few minutes afterward to ensure your heart rate, heart rhythm, and blood pressure are normal. AFTER THE PROCEDURE  When your test is completed, you may be asked to schedule an office visit with your health care provider to discuss the test results, or your health care provider may choose to call you with the results.  Document Released: 05/12/2006 Document Revised: 07/19/2013 Document Reviewed: 06/19/2011 [facility] Patient Information 2015 [place], [place]. This information is not intended to replace advice given to you by your health  care provider. Make sure you discuss any questions you have with your health care provider.

## 2014-10-20 NOTE — ED Notes (Signed)
Pt transported to xray 

## 2014-10-20 NOTE — Telephone Encounter (Signed)
Pt c/o of Chest Pain: STAT if CP now or developed within 24 hours  1. Are you having CP right now?  Yes   2. Are you experiencing any other symptoms (ex. SOB, nausea, vomiting, sweating)? Yes and pain in her jaw   3. How long have you been experiencing CP? For a couple days   4. Is your CP continuous or coming and going? Continuous   5. Have you taken Nitroglycerin? No ?

## 2014-10-20 NOTE — ED Notes (Signed)
MD at bedside. [employer]

## 2014-10-20 NOTE — ED Provider Notes (Signed)
CSN: [id number]     Arrival date & time 10/20/14  1116 History   First MD Initiated Contact with Patient 10/20/14 1128     Chief Complaint  Patient presents with  . Chest Pain     (Consider location/radiation/quality/duration/timing/severity/associated sxs/prior Treatment) HPI  79 year old female presents with chest pain and jaw pain this morning. The patient states that around 8 AM she developed chest pain that felt like a "pain" that she is unable to describe in any other way. Denies any dyspnea but felt nauseated. Patient states she had chest pain yesterday but felt more like gas and this pain feels different. After she was done eating she felt some jaw pain bilaterally that has improved but is not gone. The chest pain went away on its own after about 3 or 4 hours. The patient has never had pain like this before. Has a history of afib on eliquis. No known CAD history. She notes that she has also been having sweating 24 hours per day for the past 3 days with no other symptoms prior to chest pain starting yesterday.   Past Medical History  Diagnosis Date  . Hypertension   . Peripheral neuropathy   . Sjogren's syndrome     bilateral vision  . Gait disorder 06/05/2012  . Migraines   . Paroxysmal atrial fibrillation 12/06/2012    Echo (12/07/12): Moderate focal basal hypertrophy of the septum, vigorous LV function, EF 65-70%, indeterminate diastolic function, normal wall motion, trivial MR, moderate LAE, trivial TR;  Xarelto started 11/2012  . Osteoarthritis   . Stroke   . Allergy    Past Surgical History  Procedure Laterality Date  . Shoulder open rotator cuff repair    . Lumbar laminectomy    . Abdominal hysterectomy    . Eye surgery     Family History  Problem Relation Age of Onset  . Stomach cancer Father     Deceased, 83  . Stroke Mother     Deceased, 86  . Sjogren's syndrome Father   . Sjogren's syndrome Brother   . Sjogren's syndrome Brother   . Scleroderma Sister   .  Lymphoma Daughter     Deceased, 72  . Migraines Mother   . Migraines Daughter    History  Substance Use Topics  . Smoking status: Former Research scientist (life sciences)  . Smokeless tobacco: Never Used  . Alcohol Use: No   OB History    No data available     Review of Systems  Respiratory: Negative for shortness of breath.   Cardiovascular: Positive for chest pain.  Gastrointestinal: Positive for abdominal pain (yesterday, none now). Negative for vomiting.  All other systems reviewed and are negative.     Allergies  Codeine; Hydrocodone; Morphine and related; and Oxycodone-aspirin  Home Medications   Prior to Admission medications   Medication Sig Start Date End Date Taking? Authorizing Provider  apixaban (ELIQUIS) 5 MG TABS tablet Take 1 tablet (5 mg total) by mouth 2 (two) times daily. 04/22/14   Lelon Perla, MD  clobetasol ointment (TEMOVATE) 0.05 %  09/25/13   Historical Provider, MD  cyanocobalamin (,VITAMIN B-12,) 1000 MCG/ML injection Inject 1 mL (1,000 mcg total) into the muscle once. 05/18/13   Donika Keith Rake, DO  cyclobenzaprine (FLEXERIL) 5 MG tablet TAKE 1 TABLET BY MOUTH AT BEDTIME 07/15/14   Donika K Patel, DO  cyclobenzaprine (FLEXERIL) 5 MG tablet TAKE 1 TABLET (5 MG TOTAL) BY MOUTH AT BEDTIME. 07/15/14   Alda Berthold, DO  diltiazem (CARDIZEM CD) 120 MG 24 hr capsule Take 1 capsule (120 mg total) by mouth daily. 08/04/14   Lelon Perla, MD  dorzolamide-timolol (COSOPT) 22.3-6.8 MG/ML ophthalmic solution Place 1 drop into both eyes 2 (two) times daily.     Historical Provider, MD  fenofibrate (TRICOR) 145 MG tablet Take 145 mg by mouth daily.    Historical Provider, MD  FLUZONE HIGH-DOSE 0.5 ML SUSY  12/21/13   Historical Provider, MD  furosemide (LASIX) 20 MG tablet One tablet daily as needed for swelling 09/01/13   Lelon Perla, MD  LORazepam (ATIVAN) 1 MG tablet Take 1 mg by mouth at bedtime as needed.  01/25/14   Historical Provider, MD  omeprazole-sodium bicarbonate  (ZEGERID) 40-1100 MG per capsule  08/16/13   Historical Provider, MD  ondansetron (ZOFRAN) 4 MG tablet TAKE 1 TABLET (4 MG TOTAL) BY MOUTH EVERY 8 (EIGHT) HOURS AS NEEDED FOR NAUSEA OR VOMITING. Patient not taking: Reported on 10/03/2014 10/11/13   Alda Berthold, DO  PARoxetine (PAXIL) 10 MG tablet  11/21/13   Historical Provider, MD  pilocarpine (SALAGEN) 5 MG tablet Take 5 mg by mouth 3 (three) times daily.    Historical Provider, MD  pregabalin (LYRICA) 150 MG capsule Take one tablet at bedtime and two tablet at bedtime. Patient taking differently: Take one tablet in the morning and two tablets at bedtime. 03/04/14   Donika Keith Rake, DO  sucralfate (CARAFATE) 1 G tablet Take 1 g by mouth 4 (four) times daily.  05/27/12   Historical Provider, MD  traMADol Veatrice Bourbon) 50 MG tablet  08/19/13   Historical Provider, MD  Vitamin D, Ergocalciferol, (DRISDOL) 50000 UNITS CAPS Take 50,000 Units by mouth every 7 (seven) days. Does not remember what day of the week she takes 05/14/12   Historical Provider, MD   BP 106/63 mmHg  Pulse 84  Temp(Src) 98.5 F (36.9 C) (Oral)  Resp 18  Ht 5\' 1"  (1.549 m)  Wt 186 lb (84.369 kg)  BMI 35.16 kg/m2  SpO2 94% Physical Exam  Constitutional: She is oriented to person, place, and time. She appears well-developed and well-nourished.  HENT:  Head: Normocephalic and atraumatic.  Right Ear: External ear normal.  Left Ear: External ear normal.  Nose: Nose normal.  Eyes: Right eye exhibits no discharge. Left eye exhibits no discharge.  Cardiovascular: Normal rate and normal heart sounds.  An irregularly irregular rhythm present.  HR between 90-110  Pulmonary/Chest: Effort normal and breath sounds normal. She exhibits tenderness (just left of sternum).  Abdominal: Soft. There is no tenderness.  Neurological: She is alert and oriented to person, place, and time.  Skin: Skin is warm and dry.  Nursing note and vitals reviewed.   ED Course  Procedures (including critical  care time) Labs Review Labs Reviewed  BASIC METABOLIC PANEL - Abnormal; Notable for the following:    Chloride 100 (*)    Glucose, Bld 140 (*)    Calcium 10.5 (*)    All other components within normal limits  CBC - Abnormal; Notable for the following:    WBC 18.1 (*)    RBC 5.81 (*)    MCV 76.4 (*)    MCH 22.7 (*)    MCHC 29.7 (*)    RDW 22.6 (*)    All other components within normal limits  URINALYSIS, ROUTINE W REFLEX MICROSCOPIC (NOT AT [facility]) - Abnormal; Notable for the following:    Color, Urine AMBER (*)    APPearance HAZY (*)  Bilirubin Urine SMALL (*)    Ketones, ur 15 (*)    Protein, ur 100 (*)    Leukocytes, UA SMALL (*)    All other components within normal limits  URINE MICROSCOPIC-ADD ON - Abnormal; Notable for the following:    Squamous Epithelial / LPF MANY (*)    Bacteria, UA FEW (*)    Casts HYALINE CASTS (*)    All other components within normal limits  TROPONIN I  Randolm Idol, ED    Imaging Review Dg Chest 2 View  10/20/2014   CLINICAL DATA:  Atrial fibrillation and chest pain  EXAM: CHEST  2 VIEW  COMPARISON:  12/31/2013  FINDINGS: No cardiomegaly. Stable aortic tortuosity. There is no edema, consolidation, effusion, or pneumothorax. No acute osseous findings.  IMPRESSION: No active cardiopulmonary disease.   Electronically Signed   By: Monte Fantasia M.D.   On: 10/20/2014 12:06     EKG Interpretation   Date/Time:  Thursday October 20 2014 11:24:17 EDT Ventricular Rate:  101 PR Interval:    QRS Duration: 74 QT Interval:  286 QTC Calculation: 370 R Axis:   37 Text Interpretation:  Atrial fibrillation with rapid ventricular response  Cannot rule out Anterior infarct , age undetermined Abnormal ECG  nonspecific T waves similar to 2014 Confirmed by Greidys Deland  MD, Berneda Piccininni  ([id number]) on 10/20/2014 11:32:19 AM      MDM   Final diagnoses:  Chest pain, unspecified chest pain type  Sweating    Patient's chest pain is atypical and currently  resolved. Cardiology, Dr.Skains has evaluated the patient and feels that this is not cardiac in etiology. They have recommended a lab troponin and abnormal discharge home with close outpatient cardiac follow-up. Patient agreeable with this plan, does not one kidney type of admission. Her sweating is of unclear etiology, no fevers and no swelling now. She has an elevated white blood cell count but no obvious source. No symptoms of a UTI and no obvious UTI on urinalysis. No pneumonia. Her blood pressure briefly dropped to 93 systolic but she was asymptomatic. Small bolus of fluids brought her pressure back up. We'll recommend fluids at home and follow-up with cardiology as instructed.     Sherwood Gambler, MD 10/20/14 1556

## 2014-10-20 NOTE — Telephone Encounter (Signed)
Spoke with patient's husband Denyse Amass. He reports his wife (patient of Dr. Stanford Breed) has been having chest pain for the past 2 days. She started experiencing jaw pain associated with this today. She has been sweating profusely. She has not been able to take anything or do anything to provide relief of her symptoms. She is experiencing some nausea. She reports she gets lightheaded and dizzy during the night and has almost fallen out of the bed. Advised patient to have her husband take her to [facility] ED for eval. Will notify nursing staff that patient will be coming for eval.

## 2014-10-24 ENCOUNTER — Ambulatory Visit: Payer: Medicare Other

## 2014-11-02 NOTE — Progress Notes (Signed)
HPI: FU atrial fibrillation. She has a hx of Sjogren's syndrome, seizure disorder, HTN, HL, peripheral neuropathy. She was admitted 9/14 after presenting with overall functional decline, cough and generalized weakness and malaise. She was found to be in atrial fibrillation with RVR. CHADS2-VASc=4. She was placed on Xarelto for anticoagulation. Echo (12/07/12): Moderate focal basal hypertrophy of the septum, vigorous LV function, EF 65-70%, indeterminate diastolic function, normal wall motion, trivial MR, moderate LAE, trivial TR. She converted to sinus rhythm. MRI in October of 2014 showed a very small acute/subacute left thalamic infarct. Patient seen in the emergency room on August 4 with complaints of chest pain. Troponin normal. Patient noted to be in atrial fibrillation and white blood cell count also elevated. Since discharge the patient has some dyspnea on exertion but no orthopnea, PND, pedal edema or exertional chest pain. She does have diaphoresis.    Current Outpatient Prescriptions  Medication Sig Dispense Refill  . apixaban (ELIQUIS) 5 MG TABS tablet Take 1 tablet (5 mg total) by mouth 2 (two) times daily. 180 tablet 4  . clobetasol ointment (TEMOVATE) 0.05 %     . cyanocobalamin (,VITAMIN B-12,) 1000 MCG/ML injection Inject 1 mL (1,000 mcg total) into the muscle once. 1 mL 0  . cyclobenzaprine (FLEXERIL) 5 MG tablet TAKE 1 TABLET BY MOUTH AT BEDTIME 90 tablet 3  . cyclobenzaprine (FLEXERIL) 5 MG tablet TAKE 1 TABLET (5 MG TOTAL) BY MOUTH AT BEDTIME. 90 tablet 0  . diltiazem (CARDIZEM CD) 120 MG 24 hr capsule Take 1 capsule (120 mg total) by mouth daily. 90 capsule 1  . dorzolamide-timolol (COSOPT) 22.3-6.8 MG/ML ophthalmic solution Place 1 drop into both eyes 2 (two) times daily.     Marland Kitchen doxycycline (VIBRAMYCIN) 50 MG capsule Take 1 capsule by mouth daily.  11  . fenofibrate (TRICOR) 145 MG tablet Take 145 mg by mouth daily.    Marland Kitchen FLUZONE HIGH-DOSE 0.5 ML SUSY   0  . furosemide  (LASIX) 20 MG tablet One tablet daily as needed for swelling 90 tablet 3  . LORazepam (ATIVAN) 1 MG tablet Take 1 mg by mouth at bedtime as needed.   1  . omeprazole (PRILOSEC) 40 MG capsule Take 40 mg by mouth daily.  0  . omeprazole-sodium bicarbonate (ZEGERID) 40-1100 MG per capsule     . ondansetron (ZOFRAN) 4 MG tablet TAKE 1 TABLET (4 MG TOTAL) BY MOUTH EVERY 8 (EIGHT) HOURS AS NEEDED FOR NAUSEA OR VOMITING. 90 tablet 0  . ondansetron (ZOFRAN-ODT) 4 MG disintegrating tablet Take 1 tablet by mouth as needed.  1  . PARoxetine (PAXIL) 10 MG tablet     . pilocarpine (SALAGEN) 5 MG tablet Take 5 mg by mouth 3 (three) times daily.    . predniSONE (STERAPRED UNI-PAK 21 TAB) 5 MG (21) TBPK tablet Take 5 mg by mouth See admin instructions. see package  0  . pregabalin (LYRICA) 150 MG capsule Take one tablet at bedtime and two tablet at bedtime. (Patient taking differently: Take one tablet in the morning and two tablets at bedtime.) 240 capsule 3  . sucralfate (CARAFATE) 1 G tablet Take 1 g by mouth 4 (four) times daily.     . traMADol (ULTRAM) 50 MG tablet     . Vitamin D, Ergocalciferol, (DRISDOL) 50000 UNITS CAPS Take 50,000 Units by mouth every 7 (seven) days. Does not remember what day of the week she takes     No current facility-administered medications for this visit.  Past Medical History  Diagnosis Date  . Hypertension   . Peripheral neuropathy   . Sjogren's syndrome     bilateral vision  . Gait disorder 06/05/2012  . Migraines   . Paroxysmal atrial fibrillation 12/06/2012    Echo (12/07/12): Moderate focal basal hypertrophy of the septum, vigorous LV function, EF 65-70%, indeterminate diastolic function, normal wall motion, trivial MR, moderate LAE, trivial TR;  Xarelto started 11/2012  . Osteoarthritis   . Stroke   . Allergy     Past Surgical History  Procedure Laterality Date  . Shoulder open rotator cuff repair    . Lumbar laminectomy    . Abdominal hysterectomy    .  Eye surgery      Social History   Social History  . Marital Status: Married    Spouse Name: Denyse Amass  . Number of Children: 2  . Years of Education: college   Occupational History  . retired    Social History Main Topics  . Smoking status: Former Research scientist (life sciences)  . Smokeless tobacco: Never Used  . Alcohol Use: No  . Drug Use: No  . Sexual Activity: Not on file   Other Topics Concern  . Not on file   Social History Narrative   She lives with husband of 110 years.  They have a one story home.    ROS: no fevers or chills, productive cough, hemoptysis, dysphasia, odynophagia, melena, hematochezia, dysuria, hematuria, rash, seizure activity, orthopnea, PND, pedal edema, claudication. Remaining systems are negative.  Physical Exam: Well-developed obese in no acute distress.  Skin is warm and dry.  HEENT is normal.  Neck is supple.  Chest is clear to auscultation with normal expansion.  Cardiovascular exam is tachycardic and irregular Abdominal exam nontender or distended. No masses palpated. Extremities show no edema. neuro grossly intact  Electrocardiogram shows atrial fibrillation at a rate of 105. Nonspecific ST changes.

## 2014-11-04 ENCOUNTER — Encounter: Payer: Self-pay | Admitting: Cardiology

## 2014-11-04 ENCOUNTER — Encounter: Payer: Self-pay | Admitting: *Deleted

## 2014-11-04 ENCOUNTER — Ambulatory Visit (INDEPENDENT_AMBULATORY_CARE_PROVIDER_SITE_OTHER): Payer: Medicare Other | Admitting: Cardiology

## 2014-11-04 VITALS — BP 112/70 | HR 70 | Ht 61.0 in | Wt 194.5 lb

## 2014-11-04 DIAGNOSIS — D72829 Elevated white blood cell count, unspecified: Secondary | ICD-10-CM | POA: Insufficient documentation

## 2014-11-04 DIAGNOSIS — I4891 Unspecified atrial fibrillation: Secondary | ICD-10-CM

## 2014-11-04 DIAGNOSIS — R079 Chest pain, unspecified: Secondary | ICD-10-CM | POA: Diagnosis not present

## 2014-11-04 DIAGNOSIS — I10 Essential (primary) hypertension: Secondary | ICD-10-CM | POA: Diagnosis not present

## 2014-11-04 DIAGNOSIS — E781 Pure hyperglyceridemia: Secondary | ICD-10-CM

## 2014-11-04 DIAGNOSIS — R072 Precordial pain: Secondary | ICD-10-CM

## 2014-11-04 LAB — CBC
HEMATOCRIT: 39.3 % (ref 36.0–46.0)
Hemoglobin: 12.2 g/dL (ref 12.0–15.0)
MCH: 23.6 pg — AB (ref 26.0–34.0)
MCHC: 31 g/dL (ref 30.0–36.0)
MCV: 76.2 fL — ABNORMAL LOW (ref 78.0–100.0)
MPV: 10.1 fL (ref 8.6–12.4)
Platelets: 256 10*3/uL (ref 150–400)
RBC: 5.16 MIL/uL — ABNORMAL HIGH (ref 3.87–5.11)
RDW: 24.6 % — ABNORMAL HIGH (ref 11.5–15.5)
WBC: 10.9 10*3/uL — AB (ref 4.0–10.5)

## 2014-11-04 MED ORDER — AMIODARONE HCL 200 MG PO TABS: 200.0000 mg | ORAL_TABLET | Freq: Two times a day (BID) | ORAL | Status: DC

## 2014-11-04 MED ORDER — AMIODARONE HCL 200 MG PO TABS: 200.0000 mg | ORAL_TABLET | Freq: Every day | ORAL | Status: DC

## 2014-11-04 NOTE — Assessment & Plan Note (Signed)
Patient has developed recurrent atrial fibrillation.this may be contributing to her dyspnea on exertion. Repeat echocardiogram. Her rate is mildly elevated and her blood pressure is borderline. Continue present dose of Cardizem. Add amiodarone 200 mg by mouth twice a day for 30 days and then decrease to 200 mg daily. Continue apixaban. Note she has fallen in the past. I'm concerned we may need to discontinue anticoagulation in the future. However she may need cardioversion if atrial fibrillation persists following addition of amiodarone. I will continue for now. I will see her back in 8 weeks and if atrial fibrillation persists we will consider cardioversion at that point.

## 2014-11-04 NOTE — Assessment & Plan Note (Signed)
Repeat CBC. No localizing signs or symptoms of infection.

## 2014-11-04 NOTE — Assessment & Plan Note (Signed)
Discontinue TriCor.

## 2014-11-04 NOTE — Assessment & Plan Note (Signed)
Blood pressure controlled. Continue present medications. 

## 2014-11-04 NOTE — Assessment & Plan Note (Signed)
Plan nuclear study for risk stratification.

## 2014-11-04 NOTE — Patient Instructions (Signed)
Your physician recommends that you schedule a follow-up appointment in: [place] has requested that you have a lexiscan myoview. For further information please visit [email or web address]. Please follow instruction sheet, as given.   Your physician has requested that you have an echocardiogram. Echocardiography is a painless test that uses sound waves to create images of your heart. It provides your doctor with information about the size and shape of your heart and how well your heart's chambers and valves are working. This procedure takes approximately one hour. There are no restrictions for this procedure.   Your physician recommends that you HAVE LAB WORK TODAY  START AMIODARONE 200 MG ONE TABLET TWICE DAILY X ONE MONTH  THEN DECREASE AMIODARONE TO 200 MG ONCE DAILY  STOP FENOFIBRATE

## 2014-11-08 DIAGNOSIS — M545 Low back pain: Secondary | ICD-10-CM | POA: Diagnosis not present

## 2014-11-08 DIAGNOSIS — M5136 Other intervertebral disc degeneration, lumbar region: Secondary | ICD-10-CM | POA: Diagnosis not present

## 2014-11-08 DIAGNOSIS — M4316 Spondylolisthesis, lumbar region: Secondary | ICD-10-CM | POA: Diagnosis not present

## 2014-11-14 ENCOUNTER — Other Ambulatory Visit: Payer: Self-pay | Admitting: Neurology

## 2014-11-14 ENCOUNTER — Telehealth (HOSPITAL_COMMUNITY): Payer: Self-pay

## 2014-11-14 NOTE — Telephone Encounter (Signed)
Patient given detailed instructions per Myocardial Perfusion Study Information Sheet for test on 11/16/2014 at 8:30. Patient Notified to arrive 15 minutes early, and that it is imperative to arrive on time for appointment to keep from having the test rescheduled. Patient verbalized understanding. EHK

## 2014-11-14 NOTE — Telephone Encounter (Signed)
Left message on voicemail in reference to upcoming appointment scheduled for 11-16-2014. Phone number given for a call back so details instructions can be given. Krystal Clarke A   

## 2014-11-15 ENCOUNTER — Other Ambulatory Visit: Payer: Self-pay | Admitting: *Deleted

## 2014-11-15 MED ORDER — PREGABALIN 150 MG PO CAPS: ORAL_CAPSULE | ORAL | Status: DC

## 2014-11-16 ENCOUNTER — Ambulatory Visit (HOSPITAL_BASED_OUTPATIENT_CLINIC_OR_DEPARTMENT_OTHER): Payer: Medicare Other

## 2014-11-16 ENCOUNTER — Ambulatory Visit (HOSPITAL_COMMUNITY): Payer: Medicare Other | Attending: Cardiovascular Disease

## 2014-11-16 ENCOUNTER — Other Ambulatory Visit: Payer: Self-pay

## 2014-11-16 DIAGNOSIS — I4891 Unspecified atrial fibrillation: Secondary | ICD-10-CM | POA: Diagnosis not present

## 2014-11-16 DIAGNOSIS — I10 Essential (primary) hypertension: Secondary | ICD-10-CM | POA: Diagnosis not present

## 2014-11-16 DIAGNOSIS — R002 Palpitations: Secondary | ICD-10-CM | POA: Insufficient documentation

## 2014-11-16 DIAGNOSIS — R079 Chest pain, unspecified: Secondary | ICD-10-CM | POA: Insufficient documentation

## 2014-11-16 DIAGNOSIS — R0609 Other forms of dyspnea: Secondary | ICD-10-CM | POA: Diagnosis not present

## 2014-11-16 LAB — MYOCARDIAL PERFUSION IMAGING
LHR: 0.34
LVDIAVOL: 55 mL
LVSYSVOL: 19 mL
Peak HR: 114 {beats}/min
Rest HR: 92 {beats}/min
SDS: 2
SRS: 6
SSS: 8
TID: 0.91

## 2014-11-16 MED ORDER — TECHNETIUM TC 99M SESTAMIBI GENERIC - CARDIOLITE
32.9000 | Freq: Once | INTRAVENOUS | Status: AC | PRN
  Administered 2014-11-16: 32.9 via INTRAVENOUS

## 2014-11-16 MED ORDER — TECHNETIUM TC 99M SESTAMIBI GENERIC - CARDIOLITE
10.9000 | Freq: Once | INTRAVENOUS | Status: AC | PRN
  Administered 2014-11-16: 10.9 via INTRAVENOUS

## 2014-11-16 MED ORDER — REGADENOSON 0.4 MG/5ML IV SOLN
0.4000 mg | Freq: Once | INTRAVENOUS | Status: AC
  Administered 2014-11-16: 0.4 mg via INTRAVENOUS

## 2014-11-24 DIAGNOSIS — I4891 Unspecified atrial fibrillation: Secondary | ICD-10-CM | POA: Diagnosis not present

## 2014-11-24 DIAGNOSIS — E538 Deficiency of other specified B group vitamins: Secondary | ICD-10-CM | POA: Diagnosis not present

## 2014-11-24 DIAGNOSIS — Z23 Encounter for immunization: Secondary | ICD-10-CM | POA: Diagnosis not present

## 2014-11-24 DIAGNOSIS — R7302 Impaired glucose tolerance (oral): Secondary | ICD-10-CM | POA: Diagnosis not present

## 2014-11-24 DIAGNOSIS — E559 Vitamin D deficiency, unspecified: Secondary | ICD-10-CM | POA: Diagnosis not present

## 2014-11-24 DIAGNOSIS — B370 Candidal stomatitis: Secondary | ICD-10-CM | POA: Diagnosis not present

## 2014-11-24 DIAGNOSIS — I10 Essential (primary) hypertension: Secondary | ICD-10-CM | POA: Diagnosis not present

## 2014-12-05 DIAGNOSIS — M75102 Unspecified rotator cuff tear or rupture of left shoulder, not specified as traumatic: Secondary | ICD-10-CM | POA: Diagnosis not present

## 2014-12-05 DIAGNOSIS — M1711 Unilateral primary osteoarthritis, right knee: Secondary | ICD-10-CM | POA: Diagnosis not present

## 2014-12-13 ENCOUNTER — Other Ambulatory Visit: Payer: Self-pay | Admitting: Cardiology

## 2014-12-13 ENCOUNTER — Other Ambulatory Visit: Payer: Self-pay | Admitting: Neurology

## 2014-12-13 NOTE — Telephone Encounter (Signed)
Rx sent 

## 2014-12-30 ENCOUNTER — Encounter: Payer: Self-pay | Admitting: *Deleted

## 2015-01-01 NOTE — Progress Notes (Signed)
HPI: FU atrial fibrillation. She has a hx of Sjogren's syndrome, seizure disorder, HTN, HL, peripheral neuropathy. She was admitted 9/14 after presenting with overall functional decline, cough and generalized weakness and malaise. She was found to be in atrial fibrillation with RVR. CHADS2-VASc=4. She was placed on Xarelto for anticoagulation. Echo (12/07/12): Moderate focal basal hypertrophy of the septum, vigorous LV function, EF 65-70%, indeterminate diastolic function, normal wall motion, trivial MR, moderate LAE, trivial TR. She converted to sinus rhythm. MRI in October of 2014 showed a very small acute/subacute left thalamic infarct. Patient found to be in recurrent atrial fibrillation 8/16. Nuclear study 8/16 showed EF 65 and no ischemia. Echo 8/16 showed normal LV function. Patient placed on amiodarone at last ov. Since last seen,   Current Outpatient Prescriptions  Medication Sig Dispense Refill  . amiodarone (PACERONE) 200 MG tablet Take 1 tablet (200 mg total) by mouth daily. 90 tablet 3  . apixaban (ELIQUIS) 5 MG TABS tablet Take 1 tablet (5 mg total) by mouth 2 (two) times daily. 180 tablet 4  . clobetasol ointment (TEMOVATE) 0.05 %     . cyanocobalamin (,VITAMIN B-12,) 1000 MCG/ML injection Inject 1 mL (1,000 mcg total) into the muscle once. 1 mL 0  . cyclobenzaprine (FLEXERIL) 5 MG tablet TAKE 1 TABLET BY MOUTH AT BEDTIME 90 tablet 3  . diltiazem (CARDIZEM CD) 120 MG 24 hr capsule Take 1 capsule (120 mg total) by mouth daily. 90 capsule 1  . dorzolamide-timolol (COSOPT) 22.3-6.8 MG/ML ophthalmic solution Place 1 drop into both eyes 2 (two) times daily.     Marland Kitchen doxycycline (VIBRAMYCIN) 50 MG capsule Take 1 capsule by mouth daily.  11  . fenofibrate (TRICOR) 145 MG tablet Take 1 tablet by mouth daily.  0  . FLUZONE HIGH-DOSE 0.5 ML SUSY   0  . furosemide (LASIX) 20 MG tablet One tablet daily as needed for swelling 90 tablet 3  . LORazepam (ATIVAN) 1 MG tablet Take 1 mg by mouth at  bedtime as needed.   1  . nystatin (MYCOSTATIN) 100000 UNIT/ML suspension Take 100,000 Units by mouth daily.  3  . omeprazole (PRILOSEC) 40 MG capsule Take 40 mg by mouth daily.  0  . ondansetron (ZOFRAN) 4 MG tablet TAKE 1 TABLET (4 MG TOTAL) BY MOUTH EVERY 8 (EIGHT) HOURS AS NEEDED FOR NAUSEA OR VOMITING. 90 tablet 0  . ondansetron (ZOFRAN-ODT) 4 MG disintegrating tablet Take 1 tablet by mouth as needed.  1  . pilocarpine (SALAGEN) 5 MG tablet Take 5 mg by mouth 3 (three) times daily.    . pregabalin (LYRICA) 150 MG capsule Take one tablet in the morning and two tablets at bedtime. 240 capsule 3  . sucralfate (CARAFATE) 1 G tablet Take 1 g by mouth 4 (four) times daily.     . traMADol (ULTRAM) 50 MG tablet     . Vitamin D, Ergocalciferol, (DRISDOL) 50000 UNITS CAPS Take 50,000 Units by mouth every 7 (seven) days. Does not remember what day of the week she takes     No current facility-administered medications for this visit.     Past Medical History  Diagnosis Date  . Hypertension   . Peripheral neuropathy ([place])   . Sjogren's syndrome ([place])     bilateral vision  . Gait disorder 06/05/2012  . Migraines   . Paroxysmal atrial fibrillation ([place]) 12/06/2012    Echo (12/07/12): Moderate focal basal hypertrophy of the septum, vigorous LV function, EF 65-70%, indeterminate  diastolic function, normal wall motion, trivial MR, moderate LAE, trivial TR;  Xarelto started 11/2012  . Osteoarthritis   . Stroke ([place])   . Allergy   . GERD (gastroesophageal reflux disease)   . OP (osteoporosis)   . Blindness of right eye   . Glucose intolerance (impaired glucose tolerance)   . History of diverticulitis   . Neuropathy ([place])   . Migraine triggered seizures     history of  . Glaucoma   . Siriasis     Past Surgical History  Procedure Laterality Date  . Shoulder open rotator cuff repair    . Lumbar laminectomy    . Total abdominal hysterectomy w/ bilateral salpingoophorectomy    . Eye surgery      . Knee arthroscopy Right   . Cataract extraction    . Bone tumor excision Right     arm  . Tonsillectomy and adenoidectomy    . Foot fracture surgery Left 2007    Social History   Social History  . Marital Status: Married    Spouse Name: Denyse Amass  . Number of Children: 2  . Years of Education: college   Occupational History  . retired    Social History Main Topics  . Smoking status: Former Research scientist (life sciences)  . Smokeless tobacco: Never Used  . Alcohol Use: No  . Drug Use: No  . Sexual Activity: Not on file   Other Topics Concern  . Not on file   Social History Narrative   She lives with husband of 8 years.  They have a one story home.    ROS: no fevers or chills, productive cough, hemoptysis, dysphasia, odynophagia, melena, hematochezia, dysuria, hematuria, rash, seizure activity, orthopnea, PND, pedal edema, claudication. Remaining systems are negative.  Physical Exam: Well-developed well-nourished in no acute distress.  Skin is warm and dry.  HEENT is normal.  Neck is supple.  Chest is clear to auscultation with normal expansion.  Cardiovascular exam is irregular Abdominal exam nontender or distended. No masses palpated. Extremities show no edema. neuro grossly intact  ECG Atrial fibrillation at a rate of 87.Nonspecific ST changes.

## 2015-01-02 ENCOUNTER — Ambulatory Visit (INDEPENDENT_AMBULATORY_CARE_PROVIDER_SITE_OTHER): Payer: Medicare Other | Admitting: Cardiology

## 2015-01-02 ENCOUNTER — Encounter: Payer: Self-pay | Admitting: Cardiology

## 2015-01-02 VITALS — BP 150/80 | HR 87 | Ht 61.0 in | Wt 194.3 lb

## 2015-01-02 DIAGNOSIS — I10 Essential (primary) hypertension: Secondary | ICD-10-CM | POA: Diagnosis not present

## 2015-01-02 DIAGNOSIS — I4891 Unspecified atrial fibrillation: Secondary | ICD-10-CM

## 2015-01-02 DIAGNOSIS — E782 Mixed hyperlipidemia: Secondary | ICD-10-CM | POA: Diagnosis not present

## 2015-01-02 NOTE — Assessment & Plan Note (Signed)
Management per primary care. 

## 2015-01-02 NOTE — Assessment & Plan Note (Signed)
Patient remains in atrial fibrillation today. She is relatively asymptomatic. Continue present medications for rate control. Continue apixaban. Continue amiodarone. We discussed cardioversion today. She is hesitant. She would like to consider this and we will make final decision when she returns in 3 months.

## 2015-01-02 NOTE — Patient Instructions (Signed)
Your physician recommends that you schedule a follow-up appointment in: 3 MONTHS WITH DR CRENSHAW  

## 2015-01-02 NOTE — Assessment & Plan Note (Signed)
Blood pressure mildly elevated. Continue present medications. Follow blood pressure. Increase medications as needed.

## 2015-01-13 IMAGING — CR DG FOOT COMPLETE 3+V*L*
3 series · 3 of 3 positions shown · non-contrast
Comparison: None.

CLINICAL DATA: Fall.  Pain in the left first, second, and third
toes.

LEFT FOOT - COMPLETE 3+ VIEW

[view not recorded (1 of 3)]
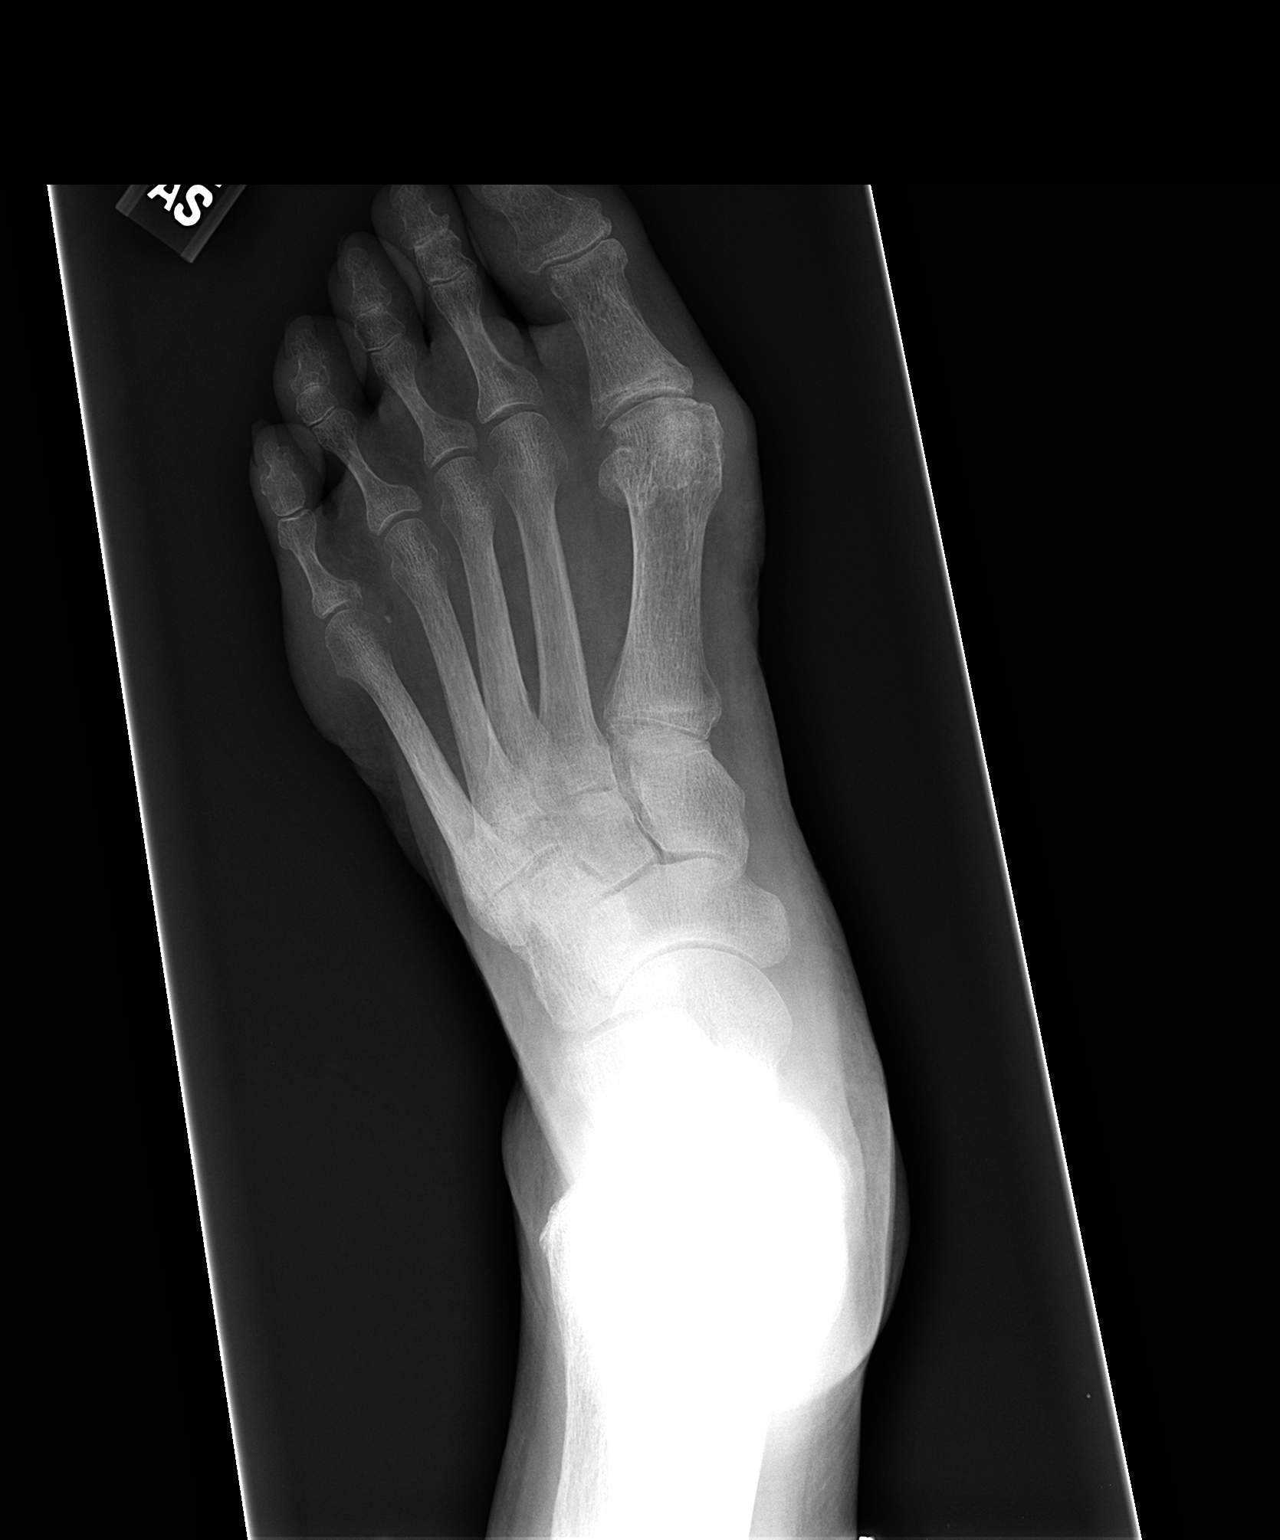

[view not recorded (2 of 3)]
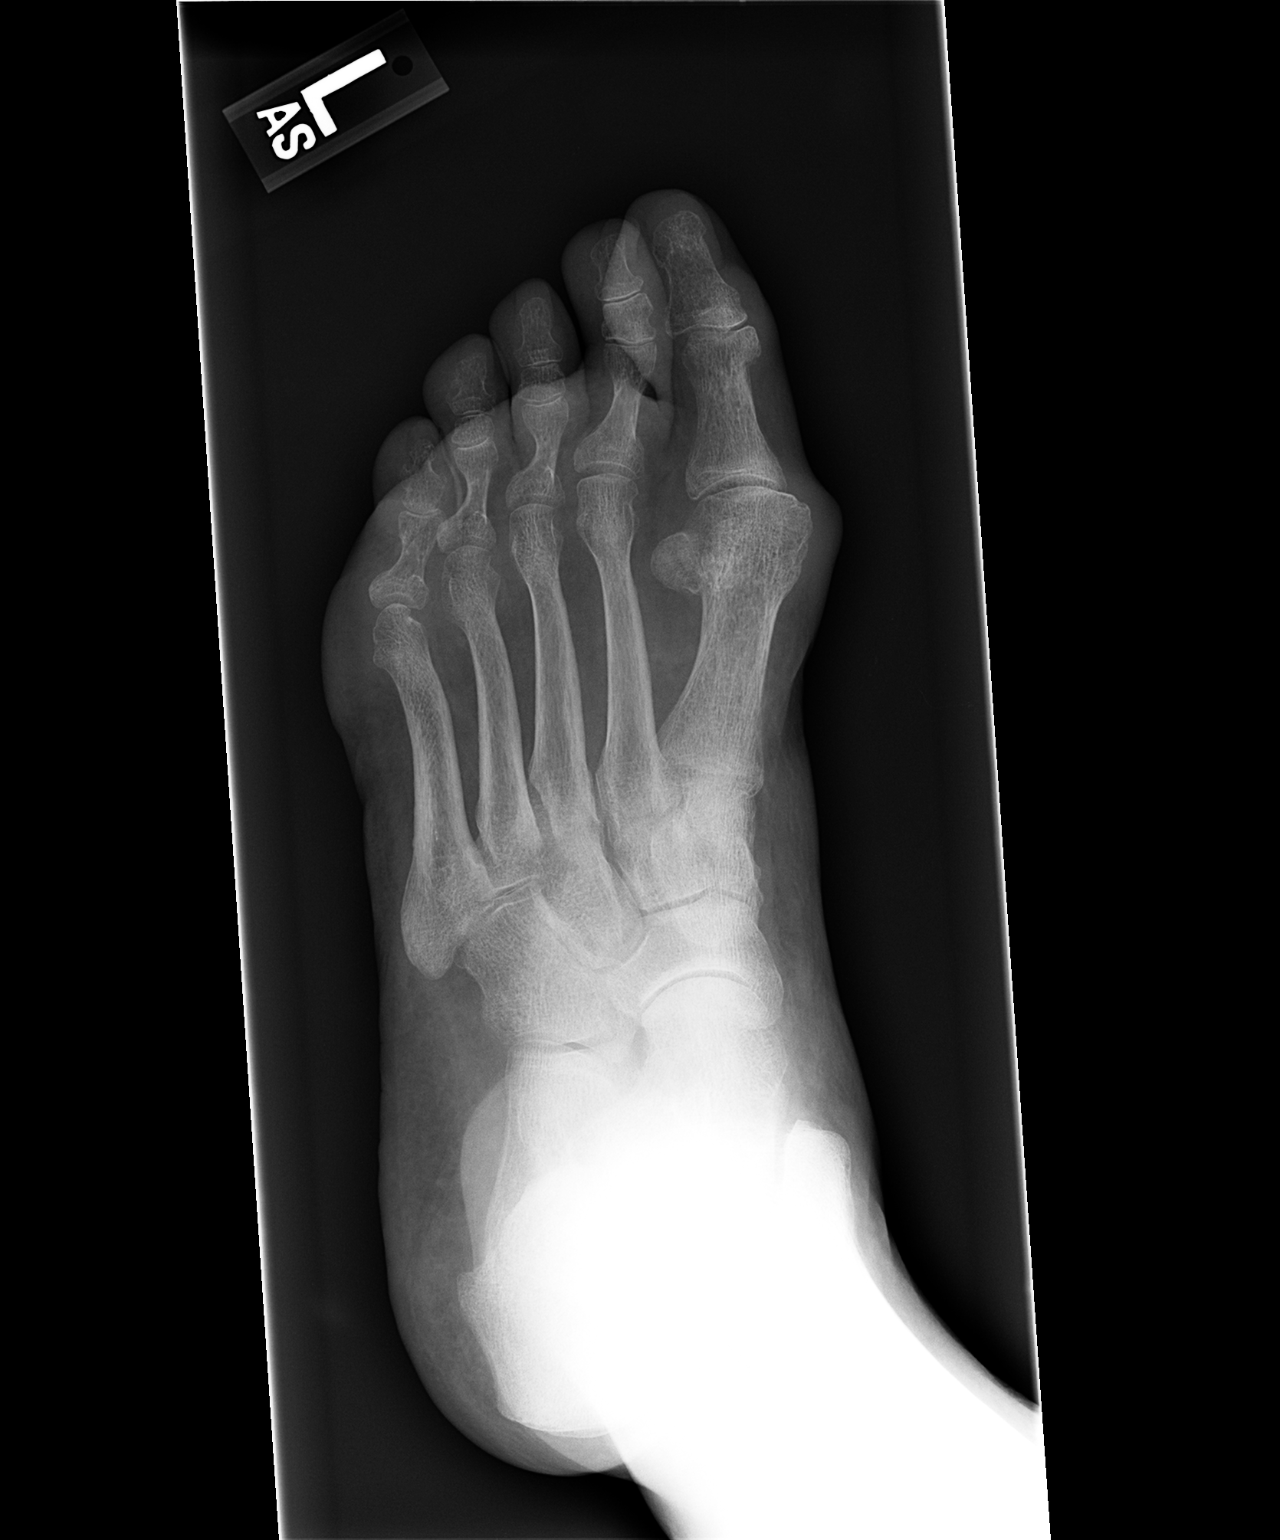

[view not recorded (3 of 3)]
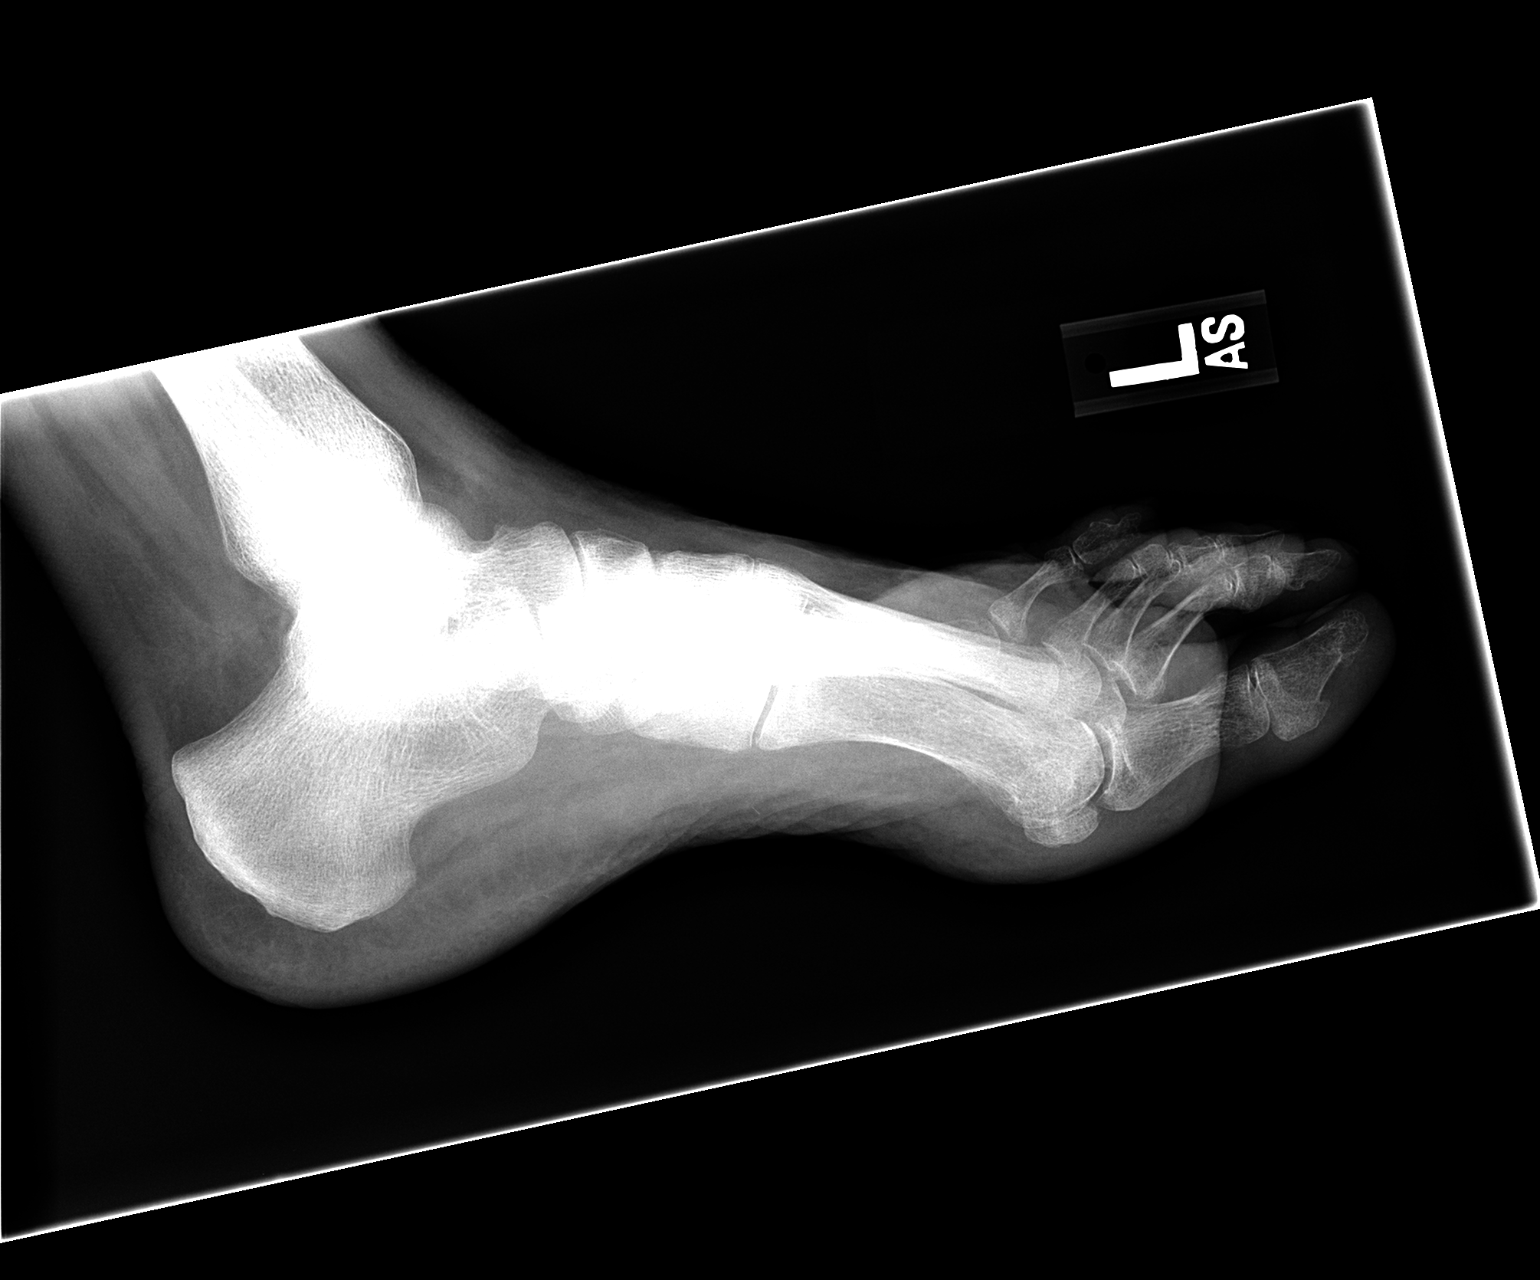

[3 of 3 positions shown; findings below may reference images not displayed]

FINDINGS: Three-view study shows no evidence for fracture.  No
subluxation or dislocation.  There is hallux valgus deformity with
some degenerative changes in the MTP joint of the great toe.
IMPRESSION: No acute bony findings.

## 2015-01-16 ENCOUNTER — Ambulatory Visit (INDEPENDENT_AMBULATORY_CARE_PROVIDER_SITE_OTHER): Payer: Medicare Other | Admitting: *Deleted

## 2015-01-16 ENCOUNTER — Ambulatory Visit (INDEPENDENT_AMBULATORY_CARE_PROVIDER_SITE_OTHER): Payer: Medicare Other | Admitting: Neurology

## 2015-01-16 ENCOUNTER — Encounter: Payer: Self-pay | Admitting: Neurology

## 2015-01-16 VITALS — BP 140/88 | HR 88 | Wt 194.5 lb

## 2015-01-16 DIAGNOSIS — M3509 Sicca syndrome with other organ involvement: Secondary | ICD-10-CM

## 2015-01-16 DIAGNOSIS — E538 Deficiency of other specified B group vitamins: Secondary | ICD-10-CM

## 2015-01-16 DIAGNOSIS — G63 Polyneuropathy in diseases classified elsewhere: Secondary | ICD-10-CM

## 2015-01-16 DIAGNOSIS — M3506 Sjogren syndrome with peripheral nervous system involvement: Secondary | ICD-10-CM

## 2015-01-16 MED ORDER — CYANOCOBALAMIN 1000 MCG/ML IJ SOLN
1000.0000 ug | Freq: Once | INTRAMUSCULAR | Status: AC
  Administered 2015-01-16: 1000 ug via INTRAMUSCULAR

## 2015-01-16 NOTE — Progress Notes (Signed)
Note routed., referral sent and left message with Mr. Bohall to remind patient to contact PCP.

## 2015-01-16 NOTE — Progress Notes (Signed)
Follow-up Visit   Date: 01/16/2015    Krystal Clarke MRN: [id number] DOB: [birth year]   [name] is a delightful 79 y.o. right-handed Caucasian female with hypertension, Sjogren's syndrome (diagnosed 8841) complicated by neuropathy and vision impairment, migraines, atrial fibrillation (on apixiban), history of shingles affecting left T6 (07/2009) and stroke (September 2014, residual word-finding difficulty) returning to the clinic for follow-up of headaches and peripheral neuropathy.    History of present illness: She was under the care of Dr. Erling Cruz at [facility] for a over 30 years for migraines, stroke, peripheral neuropathy, headaches, and gait instability. She has a long history of migraines or started at the age of 40. Her headaches were well controlled on Depakote 250 twice a day. However, it was stopped due to low platelets and anticoagulation. She complains of right-sided throbbing and pulsating pain. She has nausea, photophobia, and phonophobia. She used to have vomiting, but does not have this any more. Previously tried medication for headaches is depakote, Botox (2 trials without benefit) and maxalt. Being on apixiban, she is unable to take NSAIDs due to bleeding risk.   Unfortunately, she was admitted to the hospital in September 2014 for community-acquired pneumonia and found to have new onset atrial fibrillation. Due to worsening headaches, MRI of the brain was obtained in October which showed a small left thalamic subacute stroke.   - Follow-up 05/20/2013: No benefit with flexeril for headaches.  After a short course of steroids, her headache significant improved. She gets low-grade (4/10) which responds to excedrin migraine (1 pill).  She reports feeling about 80-90% better.  At her last visit, she was found to have vitamin B12 deficiency and was started on injections.  Energy level is also much improved after started vitamin b12 injections.    - Follow-up  03/04/2014:  She feels that her neuropathy is worse because her right leg feels like it is in a "boot" all the time.  She unfortunately had a accident where she dropped a gallon on tea on her left foot and has been wearing a soft brace because of the swelling.  She continues to complain of a very dull low-grade daily headache lasting about 3 hours in the afternoon.  No migraines.  She feels as if she may have had TIA a few weeks ago because she has brief word-findings problems, but did not seek attention.    - UPDATE 06/17/2014:  She reports having several falls since the beginning of the year when she was not using a walker. She had an interval fall in bedroom when trying to sit on the chair and missed it. She was unable to stand so called fire department helped her back to her chair, but she did not need to go to the hospital.  Falls seem to mostly occur at home when using her cane, no falls when using her walker. She no longer has any migraines since increasing Lyrica.  She continues to get low-grade headaches about 2-3 times per week.   - UPDATE 01/16/2015:  She feels that her headaches are better, but still low grade and takes exedrin as needed.  She has noticed worsening of numbness of the feet up to the knees and also some of the hands.  No interval falls, but she is very cautious with walking.  She has new complaints of hematuria and has not mentioned this to her PCP.  She is on eliquis for atrial fibrillation.  She feels that her legs are  weaker and she had not been able to her home exercises by herself.  She also missed two vitamin B12 injections due to not getting a ride to the office.    Medications:  Current Outpatient Prescriptions on File Prior to Visit  Medication Sig Dispense Refill  . amiodarone (PACERONE) 200 MG tablet Take 1 tablet (200 mg total) by mouth daily. 90 tablet 3  . apixaban (ELIQUIS) 5 MG TABS tablet Take 1 tablet (5 mg total) by mouth 2 (two) times daily. 180 tablet 4  .  clobetasol ointment (TEMOVATE) 0.05 %     . cyanocobalamin (,VITAMIN B-12,) 1000 MCG/ML injection Inject 1 mL (1,000 mcg total) into the muscle once. 1 mL 0  . cyclobenzaprine (FLEXERIL) 5 MG tablet TAKE 1 TABLET BY MOUTH AT BEDTIME 90 tablet 3  . diltiazem (CARDIZEM CD) 120 MG 24 hr capsule Take 1 capsule (120 mg total) by mouth daily. 90 capsule 1  . dorzolamide-timolol (COSOPT) 22.3-6.8 MG/ML ophthalmic solution Place 1 drop into both eyes 2 (two) times daily.     Marland Kitchen doxycycline (VIBRAMYCIN) 50 MG capsule Take 1 capsule by mouth daily.  11  . fenofibrate (TRICOR) 145 MG tablet Take 1 tablet by mouth daily.  0  . FLUZONE HIGH-DOSE 0.5 ML SUSY   0  . furosemide (LASIX) 20 MG tablet One tablet daily as needed for swelling 90 tablet 3  . LORazepam (ATIVAN) 1 MG tablet Take 1 mg by mouth at bedtime as needed.   1  . nystatin (MYCOSTATIN) 100000 UNIT/ML suspension Take 100,000 Units by mouth daily.  3  . omeprazole (PRILOSEC) 40 MG capsule Take 40 mg by mouth daily.  0  . ondansetron (ZOFRAN) 4 MG tablet TAKE 1 TABLET (4 MG TOTAL) BY MOUTH EVERY 8 (EIGHT) HOURS AS NEEDED FOR NAUSEA OR VOMITING. 90 tablet 0  . ondansetron (ZOFRAN-ODT) 4 MG disintegrating tablet Take 1 tablet by mouth as needed.  1  . pilocarpine (SALAGEN) 5 MG tablet Take 5 mg by mouth 3 (three) times daily.    . pregabalin (LYRICA) 150 MG capsule Take one tablet in the morning and two tablets at bedtime. 240 capsule 3  . sucralfate (CARAFATE) 1 G tablet Take 1 g by mouth 4 (four) times daily.     . traMADol (ULTRAM) 50 MG tablet     . Vitamin D, Ergocalciferol, (DRISDOL) 50000 UNITS CAPS Take 50,000 Units by mouth every 7 (seven) days. Does not remember what day of the week she takes     No current facility-administered medications on file prior to visit.    Allergies:  Allergies  Allergen Reactions  . Codeine Nausea Only  . Hydrocodone Nausea And Vomiting  . Morphine And Related Nausea Only  . Oxycodone-Aspirin      REACTION: DEATHLY SICK-VOMITTING     Review of Systems:  CONSTITUTIONAL: No fevers, chills, night sweats, or weight loss.   EYES: +visual changes or eye pain ENT: No hearing changes.  No history of nose bleeds.   RESPIRATORY: No cough, wheezing and shortness of breath.   CARDIOVASCULAR: Negative for chest pain, and palpitations.   GI: Negative for abdominal discomfort, blood in stools or black stools.  No recent change in bowel habits.   GU:  No history of incontinence.  +hematuria MUSCLOSKELETAL: No history of joint pain or swelling.  No myalgias.   SKIN: +for lesions, rash, and itching.   ENDOCRINE: Negative for cold or heat intolerance, polydipsia or goiter.   PSYCH:  No  depression or anxiety symptoms.   NEURO: As Above.   Vital Signs:  BP 140/88 mmHg  Pulse 88  Wt 194 lb 8 oz (88.225 kg)  SpO2 96%  Neurological Exam: MENTAL STATUS including orientation to time, place, person, recent and remote memory, attention span and concentration, language, and fund of knowledge is fair.  Intermittent word-finding difficulty.  Speech is not dysarthric.  CRANIAL NERVES:  Severe visual impairment bilaterally, she is only able to see colors. Irregular pupils, but reactive to light.  Mild bilateral ptosis. Face is symmetric. Palate elevates symmetrically.  Tongue is midline.  Dry oral mucosa  MOTOR: Motor strength is 5-/5 throughout, including distally.  Tone is normal.   COORDINATION/GAIT: Gait is moderately wide-based with small, short steps, stooped posture and assisted with walker.   Data: Labs 12/17/2012: Na 134, K 4.4, Chloride 99, Ca 10.4, WBC 7.9, RBC 4.3, Hbg 12.2, Hct 36.9  MRI brain wo contrast 12/23/2012: Very small acute/ subacute left thalamic nonhemorrhagic infarct. Prominent small vessel disease type changes. No intracranial hemorrhage. Mild atrophy without hydrocephalus.  Prior clinic notes from Dr. Rexene Alberts, previous neurological workup includes:  EEG 06/03/1995 and 08/31/2008:  normal  CSF 1991: normal  Lab Results  Component Value Date   VITAMINB12 127* 04/15/2013   Lab Results  Component Value Date   TSH 0.834 12/06/2012     IMPRESSION/PLAN: 1. Migraines without aura, chronic daily headaches Previously used:  VPA (stopped due to low PLT), triptans (stroke), flexeril (no improvement), botox She had significant benefit with trial of medrol dose pak Clinically stable on Lyrica 150mg  in the morning and 300mg  at bedtime Still with episodic tension headaches which she treats as needed  2. Vitamin B12 deficiency  Continue B12 injections (monthly), administered today  Skipped two injections may be contributing to generalized malaise  Check level at next visit  3.  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  Explained that unfortunately, the medication will not help with numbness, only painful paresthesias  3. Gait instability with generalized weakness Walks with a 4-wheeled rollator.  She has fallen three times in 2016, one while on abixaban, no interval falls.  Restart PT for leg strengthening Risks and benefits of stroke prevention vs bleeding complication due to falls.   Strongly encouraged her to use a walker at all times and wheelchair for long distances  4. Sjogren's disease complicated by severe bilateral vision loss  Dependent for most of her ADLs on her husband. She is able to bathe and dress herself.   5. Left thalamic stroke, likely cardioembolic, still with mild word-finding problems on apixiban, followed by Dr. Stanford Breed  6. Age-related memory loss possibly transitioning into vascular dementia Neuropsychological testing 05/25/2010: Decreased verbal fluency, otherwise normal exam.  7.  Hematuria, follow-up with  PCP  Return to clinic in 56-months or sooner as needed  The duration of this appointment visit was 30 minutes of face-to-face time with the patient.  Greater than 50% of this time was spent in counseling, explanation of diagnosis, planning of further management, and coordination of care.   Thank you for allowing me to participate in patient's care.  If I can answer any additional questions, I would be pleased to do so.    Sincerely,    Donika K. Posey Pronto, DO

## 2015-01-16 NOTE — Patient Instructions (Signed)
Return to clinic in February

## 2015-01-18 ENCOUNTER — Other Ambulatory Visit: Payer: Self-pay | Admitting: *Deleted

## 2015-01-18 DIAGNOSIS — M3500 Sicca syndrome, unspecified: Secondary | ICD-10-CM

## 2015-01-18 DIAGNOSIS — R269 Unspecified abnormalities of gait and mobility: Secondary | ICD-10-CM

## 2015-01-18 DIAGNOSIS — N3001 Acute cystitis with hematuria: Secondary | ICD-10-CM | POA: Diagnosis not present

## 2015-01-18 DIAGNOSIS — R319 Hematuria, unspecified: Secondary | ICD-10-CM | POA: Diagnosis not present

## 2015-01-18 DIAGNOSIS — N898 Other specified noninflammatory disorders of vagina: Secondary | ICD-10-CM | POA: Diagnosis not present

## 2015-01-18 DIAGNOSIS — R58 Hemorrhage, not elsewhere classified: Secondary | ICD-10-CM | POA: Diagnosis not present

## 2015-01-18 DIAGNOSIS — M3509 Sicca syndrome with other organ involvement: Principal | ICD-10-CM

## 2015-01-18 DIAGNOSIS — G63 Polyneuropathy in diseases classified elsewhere: Secondary | ICD-10-CM

## 2015-01-18 DIAGNOSIS — M3506 Sjogren syndrome with peripheral nervous system involvement: Secondary | ICD-10-CM

## 2015-01-18 DIAGNOSIS — R296 Repeated falls: Secondary | ICD-10-CM

## 2015-01-19 ENCOUNTER — Telehealth: Payer: Self-pay | Admitting: Neurology

## 2015-01-19 NOTE — Telephone Encounter (Signed)
FYI

## 2015-01-19 NOTE — Telephone Encounter (Signed)
[facility] called to let Dr Posey Pronto know she was not going to start her PT until next week, Monday or Tuesday, she is on vacation/Dawn CB# [phone]

## 2015-01-23 ENCOUNTER — Other Ambulatory Visit: Payer: Self-pay | Admitting: Cardiology

## 2015-01-23 DIAGNOSIS — I10 Essential (primary) hypertension: Secondary | ICD-10-CM | POA: Diagnosis not present

## 2015-01-23 DIAGNOSIS — H401133 Primary open-angle glaucoma, bilateral, severe stage: Secondary | ICD-10-CM | POA: Diagnosis not present

## 2015-01-23 DIAGNOSIS — E538 Deficiency of other specified B group vitamins: Secondary | ICD-10-CM | POA: Diagnosis not present

## 2015-01-23 DIAGNOSIS — M6281 Muscle weakness (generalized): Secondary | ICD-10-CM | POA: Diagnosis not present

## 2015-01-23 DIAGNOSIS — M3500 Sicca syndrome, unspecified: Secondary | ICD-10-CM | POA: Diagnosis not present

## 2015-01-23 DIAGNOSIS — H547 Unspecified visual loss: Secondary | ICD-10-CM | POA: Diagnosis not present

## 2015-01-23 DIAGNOSIS — I4891 Unspecified atrial fibrillation: Secondary | ICD-10-CM | POA: Diagnosis not present

## 2015-01-23 DIAGNOSIS — G629 Polyneuropathy, unspecified: Secondary | ICD-10-CM | POA: Diagnosis not present

## 2015-01-23 DIAGNOSIS — R269 Unspecified abnormalities of gait and mobility: Secondary | ICD-10-CM | POA: Diagnosis not present

## 2015-01-31 DIAGNOSIS — M3500 Sicca syndrome, unspecified: Secondary | ICD-10-CM | POA: Diagnosis not present

## 2015-01-31 DIAGNOSIS — R269 Unspecified abnormalities of gait and mobility: Secondary | ICD-10-CM | POA: Diagnosis not present

## 2015-01-31 DIAGNOSIS — H547 Unspecified visual loss: Secondary | ICD-10-CM | POA: Diagnosis not present

## 2015-01-31 DIAGNOSIS — I10 Essential (primary) hypertension: Secondary | ICD-10-CM | POA: Diagnosis not present

## 2015-01-31 DIAGNOSIS — G629 Polyneuropathy, unspecified: Secondary | ICD-10-CM | POA: Diagnosis not present

## 2015-01-31 DIAGNOSIS — M6281 Muscle weakness (generalized): Secondary | ICD-10-CM | POA: Diagnosis not present

## 2015-02-02 DIAGNOSIS — I10 Essential (primary) hypertension: Secondary | ICD-10-CM | POA: Diagnosis not present

## 2015-02-02 DIAGNOSIS — H547 Unspecified visual loss: Secondary | ICD-10-CM | POA: Diagnosis not present

## 2015-02-02 DIAGNOSIS — R269 Unspecified abnormalities of gait and mobility: Secondary | ICD-10-CM | POA: Diagnosis not present

## 2015-02-02 DIAGNOSIS — M3500 Sicca syndrome, unspecified: Secondary | ICD-10-CM | POA: Diagnosis not present

## 2015-02-02 DIAGNOSIS — G629 Polyneuropathy, unspecified: Secondary | ICD-10-CM | POA: Diagnosis not present

## 2015-02-02 DIAGNOSIS — M6281 Muscle weakness (generalized): Secondary | ICD-10-CM | POA: Diagnosis not present

## 2015-02-06 ENCOUNTER — Telehealth: Payer: Self-pay | Admitting: Neurology

## 2015-02-06 NOTE — Telephone Encounter (Signed)
Pt Home health assistant/ called to inform that pt needed to reschedule her visit with them today/02/06/15 due to illness/ call back@ [phone]

## 2015-02-07 NOTE — Telephone Encounter (Signed)
Noted  

## 2015-02-10 DIAGNOSIS — I10 Essential (primary) hypertension: Secondary | ICD-10-CM | POA: Diagnosis not present

## 2015-02-10 DIAGNOSIS — H547 Unspecified visual loss: Secondary | ICD-10-CM | POA: Diagnosis not present

## 2015-02-10 DIAGNOSIS — M3500 Sicca syndrome, unspecified: Secondary | ICD-10-CM | POA: Diagnosis not present

## 2015-02-10 DIAGNOSIS — R269 Unspecified abnormalities of gait and mobility: Secondary | ICD-10-CM | POA: Diagnosis not present

## 2015-02-10 DIAGNOSIS — M6281 Muscle weakness (generalized): Secondary | ICD-10-CM | POA: Diagnosis not present

## 2015-02-10 DIAGNOSIS — G629 Polyneuropathy, unspecified: Secondary | ICD-10-CM | POA: Diagnosis not present

## 2015-02-13 ENCOUNTER — Ambulatory Visit (INDEPENDENT_AMBULATORY_CARE_PROVIDER_SITE_OTHER): Payer: Medicare Other | Admitting: *Deleted

## 2015-02-13 DIAGNOSIS — E538 Deficiency of other specified B group vitamins: Secondary | ICD-10-CM

## 2015-02-13 DIAGNOSIS — G629 Polyneuropathy, unspecified: Secondary | ICD-10-CM | POA: Diagnosis not present

## 2015-02-13 DIAGNOSIS — M3500 Sicca syndrome, unspecified: Secondary | ICD-10-CM | POA: Diagnosis not present

## 2015-02-13 DIAGNOSIS — M6281 Muscle weakness (generalized): Secondary | ICD-10-CM | POA: Diagnosis not present

## 2015-02-13 DIAGNOSIS — I10 Essential (primary) hypertension: Secondary | ICD-10-CM | POA: Diagnosis not present

## 2015-02-13 DIAGNOSIS — H547 Unspecified visual loss: Secondary | ICD-10-CM | POA: Diagnosis not present

## 2015-02-13 DIAGNOSIS — R269 Unspecified abnormalities of gait and mobility: Secondary | ICD-10-CM | POA: Diagnosis not present

## 2015-02-13 MED ORDER — CYANOCOBALAMIN 1000 MCG/ML IJ SOLN
1000.0000 ug | Freq: Once | INTRAMUSCULAR | Status: AC
  Administered 2015-02-13: 1000 ug via INTRAMUSCULAR

## 2015-02-13 NOTE — Progress Notes (Signed)
Patient in for B12 injection. 

## 2015-02-16 DIAGNOSIS — M3500 Sicca syndrome, unspecified: Secondary | ICD-10-CM | POA: Diagnosis not present

## 2015-02-16 DIAGNOSIS — M6281 Muscle weakness (generalized): Secondary | ICD-10-CM | POA: Diagnosis not present

## 2015-02-16 DIAGNOSIS — G629 Polyneuropathy, unspecified: Secondary | ICD-10-CM | POA: Diagnosis not present

## 2015-02-16 DIAGNOSIS — H547 Unspecified visual loss: Secondary | ICD-10-CM | POA: Diagnosis not present

## 2015-02-16 DIAGNOSIS — I10 Essential (primary) hypertension: Secondary | ICD-10-CM | POA: Diagnosis not present

## 2015-02-16 DIAGNOSIS — R269 Unspecified abnormalities of gait and mobility: Secondary | ICD-10-CM | POA: Diagnosis not present

## 2015-02-22 ENCOUNTER — Telehealth: Payer: Self-pay | Admitting: Neurology

## 2015-02-22 NOTE — Telephone Encounter (Signed)
Claiborne Billings from [place] wants to extend pt therapy x 4 more wks

## 2015-02-23 DIAGNOSIS — M3500 Sicca syndrome, unspecified: Secondary | ICD-10-CM | POA: Diagnosis not present

## 2015-02-23 DIAGNOSIS — I10 Essential (primary) hypertension: Secondary | ICD-10-CM | POA: Diagnosis not present

## 2015-02-23 DIAGNOSIS — M6281 Muscle weakness (generalized): Secondary | ICD-10-CM | POA: Diagnosis not present

## 2015-02-23 DIAGNOSIS — R269 Unspecified abnormalities of gait and mobility: Secondary | ICD-10-CM | POA: Diagnosis not present

## 2015-02-23 DIAGNOSIS — G629 Polyneuropathy, unspecified: Secondary | ICD-10-CM | POA: Diagnosis not present

## 2015-02-23 DIAGNOSIS — H547 Unspecified visual loss: Secondary | ICD-10-CM | POA: Diagnosis not present

## 2015-02-23 NOTE — Telephone Encounter (Signed)
Left message for Kelly to call me back

## 2015-02-23 NOTE — Telephone Encounter (Signed)
VO given to continue therapy.

## 2015-02-23 NOTE — Telephone Encounter (Signed)
Claiborne Billings a Physical Therapist called and wants to continue therapy and would like a call back, said she could take a verbal order over the phone/Dawn CB# [phone]

## 2015-02-24 ENCOUNTER — Encounter: Payer: Self-pay | Admitting: Cardiology

## 2015-02-24 DIAGNOSIS — R067 Sneezing: Secondary | ICD-10-CM | POA: Diagnosis not present

## 2015-02-24 DIAGNOSIS — N898 Other specified noninflammatory disorders of vagina: Secondary | ICD-10-CM | POA: Diagnosis not present

## 2015-02-24 DIAGNOSIS — R296 Repeated falls: Secondary | ICD-10-CM | POA: Diagnosis not present

## 2015-02-27 DIAGNOSIS — R269 Unspecified abnormalities of gait and mobility: Secondary | ICD-10-CM | POA: Diagnosis not present

## 2015-02-27 DIAGNOSIS — I10 Essential (primary) hypertension: Secondary | ICD-10-CM | POA: Diagnosis not present

## 2015-02-27 DIAGNOSIS — H547 Unspecified visual loss: Secondary | ICD-10-CM | POA: Diagnosis not present

## 2015-02-27 DIAGNOSIS — M6281 Muscle weakness (generalized): Secondary | ICD-10-CM | POA: Diagnosis not present

## 2015-02-27 DIAGNOSIS — G629 Polyneuropathy, unspecified: Secondary | ICD-10-CM | POA: Diagnosis not present

## 2015-02-27 DIAGNOSIS — M3500 Sicca syndrome, unspecified: Secondary | ICD-10-CM | POA: Diagnosis not present

## 2015-03-01 ENCOUNTER — Other Ambulatory Visit: Payer: Self-pay | Admitting: Family Medicine

## 2015-03-01 ENCOUNTER — Telehealth: Payer: Self-pay | Admitting: Cardiology

## 2015-03-01 DIAGNOSIS — R1013 Epigastric pain: Secondary | ICD-10-CM | POA: Diagnosis not present

## 2015-03-01 DIAGNOSIS — R112 Nausea with vomiting, unspecified: Secondary | ICD-10-CM

## 2015-03-01 DIAGNOSIS — R110 Nausea: Secondary | ICD-10-CM | POA: Diagnosis not present

## 2015-03-01 NOTE — Telephone Encounter (Signed)
Advice communicated to patient and instructed to alert [place] of changes.

## 2015-03-01 NOTE — Telephone Encounter (Signed)
Pt called in stating that he would like to speak with Hilda Blades about one of the pt's medications. Please f/u with him  Thanks

## 2015-03-01 NOTE — Telephone Encounter (Signed)
Pt has been on amiodarone since 8/19 for A Fib.   She had noted nausea for several weeks, and was recently seen by PCP who suggested amiodarone as possible cause of this. Husband called today to see if we could confirm this as cause and/or prescribe different medication.  She is also on cardizem LA 120mg , other meds as listed.  Caller is aware I will route for recommendation.

## 2015-03-01 NOTE — Telephone Encounter (Signed)
Change amiodarone to 100 mg daily; if nausea continues despite reduced dose, DC. Kirk Ruths

## 2015-03-02 ENCOUNTER — Ambulatory Visit
Admission: RE | Admit: 2015-03-02 | Discharge: 2015-03-02 | Disposition: A | Discharge status: Home / Self Care | Payer: Medicare Other | Source: Ambulatory Visit | Attending: Family Medicine | Admitting: Family Medicine

## 2015-03-02 DIAGNOSIS — R1084 Generalized abdominal pain: Secondary | ICD-10-CM | POA: Diagnosis not present

## 2015-03-02 DIAGNOSIS — R1013 Epigastric pain: Secondary | ICD-10-CM

## 2015-03-02 DIAGNOSIS — R112 Nausea with vomiting, unspecified: Secondary | ICD-10-CM

## 2015-03-02 MED ORDER — IOPAMIDOL (ISOVUE-300) INJECTION 61%
100.0000 mL | Freq: Once | INTRAVENOUS | Status: AC | PRN
  Administered 2015-03-02: 100 mL via INTRAVENOUS

## 2015-03-03 ENCOUNTER — Telehealth: Payer: Self-pay | Admitting: Neurology

## 2015-03-03 NOTE — Telephone Encounter (Signed)
Kelly/from [facility]/ called to inform that pt missed her PT appt due to illness

## 2015-03-06 DIAGNOSIS — H547 Unspecified visual loss: Secondary | ICD-10-CM | POA: Diagnosis not present

## 2015-03-06 DIAGNOSIS — M3500 Sicca syndrome, unspecified: Secondary | ICD-10-CM | POA: Diagnosis not present

## 2015-03-06 DIAGNOSIS — G629 Polyneuropathy, unspecified: Secondary | ICD-10-CM | POA: Diagnosis not present

## 2015-03-06 DIAGNOSIS — M6281 Muscle weakness (generalized): Secondary | ICD-10-CM | POA: Diagnosis not present

## 2015-03-06 DIAGNOSIS — I10 Essential (primary) hypertension: Secondary | ICD-10-CM | POA: Diagnosis not present

## 2015-03-06 DIAGNOSIS — R269 Unspecified abnormalities of gait and mobility: Secondary | ICD-10-CM | POA: Diagnosis not present

## 2015-03-06 NOTE — Telephone Encounter (Signed)
Noted  

## 2015-03-09 DIAGNOSIS — K5792 Diverticulitis of intestine, part unspecified, without perforation or abscess without bleeding: Secondary | ICD-10-CM | POA: Diagnosis not present

## 2015-03-09 DIAGNOSIS — R933 Abnormal findings on diagnostic imaging of other parts of digestive tract: Secondary | ICD-10-CM | POA: Diagnosis not present

## 2015-03-14 ENCOUNTER — Ambulatory Visit (INDEPENDENT_AMBULATORY_CARE_PROVIDER_SITE_OTHER): Payer: Medicare Other | Admitting: *Deleted

## 2015-03-14 DIAGNOSIS — E538 Deficiency of other specified B group vitamins: Secondary | ICD-10-CM

## 2015-03-14 MED ORDER — CYANOCOBALAMIN 1000 MCG/ML IJ SOLN
1000.0000 ug | Freq: Once | INTRAMUSCULAR | Status: AC
  Administered 2015-03-14: 1000 ug via INTRAMUSCULAR

## 2015-03-14 NOTE — Progress Notes (Signed)
Patient in for B12 injection. 

## 2015-04-03 NOTE — Progress Notes (Signed)
HPI: FU atrial fibrillation. She has a hx of Sjogren's syndrome, seizure disorder, HTN, HL, peripheral neuropathy. She was admitted 9/14 after presenting with overall functional decline, cough and generalized weakness and malaise. She was found to be in atrial fibrillation with RVR. CHADS2-VASc=4. She was placed on Xarelto for anticoagulation. Echo (12/07/12): Moderate focal basal hypertrophy of the septum, vigorous LV function, EF 65-70%, indeterminate diastolic function, normal wall motion, trivial MR, moderate LAE, trivial TR. She converted to sinus rhythm. MRI in October of 2014 showed a very small acute/subacute left thalamic infarct. Patient found to be in recurrent atrial fibrillation 8/16. Nuclear study 8/16 showed EF 65 and no ischemia. Echo 8/16 showed normal LV function. Patient placed on amiodarone. We discussed cardioversion the patient was hesitant. Since last seen, She denies dyspnea on exertion, orthopnea, PND, pedal edema, palpitations, syncope or chest pain. No bleeding. Patient complains of nausea.   Current Outpatient Prescriptions  Medication Sig Dispense Refill  . amiodarone (PACERONE) 200 MG tablet Take 1 tablet (200 mg total) by mouth daily. 90 tablet 3  . apixaban (ELIQUIS) 5 MG TABS tablet Take 1 tablet (5 mg total) by mouth 2 (two) times daily. 180 tablet 4  . clobetasol ointment (TEMOVATE) 0.05 %     . cyanocobalamin (,VITAMIN B-12,) 1000 MCG/ML injection Inject 1 mL (1,000 mcg total) into the muscle once. 1 mL 0  . cyclobenzaprine (FLEXERIL) 5 MG tablet TAKE 1 TABLET BY MOUTH AT BEDTIME 90 tablet 3  . diltiazem (CARDIZEM CD) 120 MG 24 hr capsule TAKE 1 CAPSULE (120 MG TOTAL) BY MOUTH DAILY. 90 capsule 3  . dorzolamide-timolol (COSOPT) 22.3-6.8 MG/ML ophthalmic solution Place 1 drop into both eyes 2 (two) times daily.     Marland Kitchen doxycycline (VIBRAMYCIN) 50 MG capsule Take 1 capsule by mouth daily.  11  . fenofibrate (TRICOR) 145 MG tablet Take 1 tablet by mouth daily.  0    . FLUZONE HIGH-DOSE 0.5 ML SUSY   0  . furosemide (LASIX) 20 MG tablet One tablet daily as needed for swelling 90 tablet 3  . LORazepam (ATIVAN) 1 MG tablet Take 1 mg by mouth at bedtime as needed.   1  . nystatin (MYCOSTATIN) 100000 UNIT/ML suspension Take 100,000 Units by mouth daily.  3  . omeprazole (PRILOSEC) 40 MG capsule Take 40 mg by mouth daily.  0  . ondansetron (ZOFRAN) 4 MG tablet TAKE 1 TABLET (4 MG TOTAL) BY MOUTH EVERY 8 (EIGHT) HOURS AS NEEDED FOR NAUSEA OR VOMITING. 90 tablet 0  . ondansetron (ZOFRAN-ODT) 4 MG disintegrating tablet Take 1 tablet by mouth as needed.  1  . pilocarpine (SALAGEN) 5 MG tablet Take 5 mg by mouth 3 (three) times daily.    . pregabalin (LYRICA) 150 MG capsule Take one tablet in the morning and two tablets at bedtime. 240 capsule 3  . sucralfate (CARAFATE) 1 G tablet Take 1 g by mouth 4 (four) times daily.     . traMADol (ULTRAM) 50 MG tablet     . Vitamin D, Ergocalciferol, (DRISDOL) 50000 UNITS CAPS Take 50,000 Units by mouth every 7 (seven) days. Does not remember what day of the week she takes     No current facility-administered medications for this visit.     Past Medical History  Diagnosis Date  . Hypertension   . Peripheral neuropathy ([place])   . Sjogren's syndrome ([place])     bilateral vision  . Gait disorder 06/05/2012  . Migraines   .  Paroxysmal atrial fibrillation ([place]) 12/06/2012    Echo (12/07/12): Moderate focal basal hypertrophy of the septum, vigorous LV function, EF 65-70%, indeterminate diastolic function, normal wall motion, trivial MR, moderate LAE, trivial TR;  Xarelto started 11/2012  . Osteoarthritis   . Stroke ([place])   . Allergy   . GERD (gastroesophageal reflux disease)   . OP (osteoporosis)   . Blindness of right eye   . Glucose intolerance (impaired glucose tolerance)   . History of diverticulitis   . Neuropathy ([place])   . Migraine triggered seizures     history of  . Glaucoma   . Siriasis     Past Surgical  History  Procedure Laterality Date  . Shoulder open rotator cuff repair    . Lumbar laminectomy    . Total abdominal hysterectomy w/ bilateral salpingoophorectomy    . Eye surgery    . Knee arthroscopy Right   . Cataract extraction    . Bone tumor excision Right     arm  . Tonsillectomy and adenoidectomy    . Foot fracture surgery Left 2007    Social History   Social History  . Marital Status: Married    Spouse Name: Denyse Amass  . Number of Children: 2  . Years of Education: college   Occupational History  . retired    Social History Main Topics  . Smoking status: Former Research scientist (life sciences)  . Smokeless tobacco: Never Used  . Alcohol Use: No  . Drug Use: No  . Sexual Activity: Not on file   Other Topics Concern  . Not on file   Social History Narrative   She lives with husband of 65 years.  They have a one story home.    Family History  Problem Relation Age of Onset  . Stomach cancer Father   . Stroke Mother   . Sjogren's syndrome Father   . Sjogren's syndrome Brother   . Sjogren's syndrome Brother   . Scleroderma Sister     raynaud's scleroderma  . Lymphoma Daughter   . Migraines Mother   . Migraines Daughter   . Heart disease      maternal side  . Stroke      maternal side, 1st degree female <60    ROS: Nausea but no fevers or chills, productive cough, hemoptysis, dysphasia, odynophagia, melena, hematochezia, dysuria, hematuria, rash, seizure activity, orthopnea, PND, pedal edema, claudication. Remaining systems are negative.  Physical Exam: Well-developed obese in no acute distress.  Skin is warm and dry.  HEENT is normal.  Neck is supple.  Chest is clear to auscultation with normal expansion.  Cardiovascular exam is irregular Abdominal exam nontender or distended. No masses palpated. Extremities show no edema. neuro grossly intact  ECG Atrial fibrillation at a rate of 98. Nonspecific ST changes.

## 2015-04-04 ENCOUNTER — Encounter: Payer: Self-pay | Admitting: Cardiology

## 2015-04-04 ENCOUNTER — Ambulatory Visit: Payer: Medicare Other | Admitting: Cardiology

## 2015-04-05 ENCOUNTER — Encounter: Payer: Self-pay | Admitting: Cardiology

## 2015-04-05 ENCOUNTER — Ambulatory Visit (INDEPENDENT_AMBULATORY_CARE_PROVIDER_SITE_OTHER): Payer: Medicare Other | Admitting: Cardiology

## 2015-04-05 DIAGNOSIS — I4891 Unspecified atrial fibrillation: Secondary | ICD-10-CM

## 2015-04-05 LAB — CBC
HEMATOCRIT: 41.1 % (ref 36.0–46.0)
Hemoglobin: 12.8 g/dL (ref 12.0–15.0)
MCH: 24.7 pg — AB (ref 26.0–34.0)
MCHC: 31.1 g/dL (ref 30.0–36.0)
MCV: 79.2 fL (ref 78.0–100.0)
MPV: 10.2 fL (ref 8.6–12.4)
Platelets: 281 10*3/uL (ref 150–400)
RBC: 5.19 MIL/uL — AB (ref 3.87–5.11)
RDW: 18 % — AB (ref 11.5–15.5)
WBC: 9.9 10*3/uL (ref 4.0–10.5)

## 2015-04-05 LAB — BASIC METABOLIC PANEL
BUN: 13 mg/dL (ref 7–25)
CALCIUM: 10.1 mg/dL (ref 8.6–10.4)
CHLORIDE: 98 mmol/L (ref 98–110)
CO2: 26 mmol/L (ref 20–31)
CREATININE: 1.05 mg/dL — AB (ref 0.60–0.93)
Glucose, Bld: 97 mg/dL (ref 65–99)
Potassium: 4.2 mmol/L (ref 3.5–5.3)
Sodium: 135 mmol/L (ref 135–146)

## 2015-04-05 MED ORDER — DILTIAZEM HCL ER COATED BEADS 180 MG PO CP24: 180.0000 mg | ORAL_CAPSULE | Freq: Every day | ORAL | Status: DC

## 2015-04-05 NOTE — Assessment & Plan Note (Signed)
Management per primary care. 

## 2015-04-05 NOTE — Patient Instructions (Signed)
Medication Instructions:   STOP AMIODARONE  INCREASE DILTIAZEM TO 180 MG ONCE DAILY  Labwork:  Your physician recommends that you HAVE LAB WORK TODAY  Testing/Procedures:  Your physician has recommended that you wear a 24 HOUR holter monitor. Holter monitors are medical devices that record the heart's electrical activity. Doctors most often use these monitors to diagnose arrhythmias. Arrhythmias are problems with the speed or rhythm of the heartbeat. The monitor is a small, portable device. You can wear one while you do your normal daily activities. This is usually used to diagnose what is causing palpitations/syncope (passing out).  SCHEDULE IN 4 WEEKS AT [place]:  Your physician wants you to follow-up in: [place] will receive a reminder letter in the mail two months in advance. If you don't receive a letter, please call our office to schedule the follow-up appointment.   If you need a refill on your cardiac medications before your next appointment, please call your pharmacy.

## 2015-04-05 NOTE — Assessment & Plan Note (Signed)
Patient remains in atrial fibrillation today.She is not interested in pursuing cardioversion. I think she is asymptomatic and therefore rate control and anticoagulation is reasonable. She is also complaining of nausea. I wonder if this may be related to amiodarone. Discontinue amiodarone. Increase Cardizem to 180 mg daily for rate control. In 4 weeks we will check a 24-hour Holter monitor to make sure that rate is adequately controlled. Continue apixaban. Check hemoglobin and renal function.

## 2015-04-05 NOTE — Assessment & Plan Note (Signed)
Blood pressure controlled. Continue present medications. 

## 2015-04-06 DIAGNOSIS — N9089 Other specified noninflammatory disorders of vulva and perineum: Secondary | ICD-10-CM | POA: Diagnosis not present

## 2015-04-14 ENCOUNTER — Ambulatory Visit (INDEPENDENT_AMBULATORY_CARE_PROVIDER_SITE_OTHER): Payer: Medicare Other | Admitting: *Deleted

## 2015-04-14 ENCOUNTER — Ambulatory Visit: Payer: Medicare Other | Admitting: Cardiology

## 2015-04-14 DIAGNOSIS — E538 Deficiency of other specified B group vitamins: Secondary | ICD-10-CM | POA: Diagnosis not present

## 2015-04-14 MED ORDER — CYANOCOBALAMIN 1000 MCG/ML IJ SOLN
1000.0000 ug | Freq: Once | INTRAMUSCULAR | Status: AC
  Administered 2015-04-14: 1000 ug via INTRAMUSCULAR

## 2015-04-14 NOTE — Progress Notes (Signed)
Patient in for B12 injection. 

## 2015-04-17 DIAGNOSIS — K5732 Diverticulitis of large intestine without perforation or abscess without bleeding: Secondary | ICD-10-CM | POA: Diagnosis not present

## 2015-04-27 ENCOUNTER — Encounter: Payer: Self-pay | Admitting: Physical Therapy

## 2015-04-27 NOTE — Therapy (Signed)
[facility] [address] [place], [place], [address] Phone: [phone]   Fax:  [phone]  Patient Details  Name: Krystal Clarke MRN: [id number] Date of Birth: February 18, 1936 Referring Provider:  Harlan Stains, MD  Encounter Date: 04/27/2015  PHYSICAL THERAPY DISCHARGE SUMMARY  Visits from Start of Care: 5  Current functional level related to goals / functional outcomes: Unknown as patient did not return   Remaining deficits: Unknown   Education / Equipment: HEP & Fall Prevention Strategies  Plan:                                                    Patient goals were not met. Patient is being discharged due to not returning since the last visit.  ?????        Sol Englert PT, DPT 04/27/2015, 1:55 PM  [facility] [address] [place], [place], [address] Phone: [phone]   Fax:  [phone]

## 2015-05-01 DIAGNOSIS — H401133 Primary open-angle glaucoma, bilateral, severe stage: Secondary | ICD-10-CM | POA: Diagnosis not present

## 2015-05-03 ENCOUNTER — Ambulatory Visit (INDEPENDENT_AMBULATORY_CARE_PROVIDER_SITE_OTHER): Payer: Medicare Other

## 2015-05-03 DIAGNOSIS — I4891 Unspecified atrial fibrillation: Secondary | ICD-10-CM

## 2015-05-10 ENCOUNTER — Ambulatory Visit (INDEPENDENT_AMBULATORY_CARE_PROVIDER_SITE_OTHER): Payer: Medicare Other | Admitting: Neurology

## 2015-05-10 ENCOUNTER — Ambulatory Visit: Payer: Medicare Other

## 2015-05-10 ENCOUNTER — Encounter: Payer: Self-pay | Admitting: Neurology

## 2015-05-10 VITALS — BP 118/80 | HR 88 | Wt 190.4 lb

## 2015-05-10 DIAGNOSIS — G43109 Migraine with aura, not intractable, without status migrainosus: Secondary | ICD-10-CM

## 2015-05-10 DIAGNOSIS — R6889 Other general symptoms and signs: Secondary | ICD-10-CM | POA: Diagnosis not present

## 2015-05-10 DIAGNOSIS — R42 Dizziness and giddiness: Secondary | ICD-10-CM | POA: Diagnosis not present

## 2015-05-10 DIAGNOSIS — M3506 Sjogren syndrome with peripheral nervous system involvement: Secondary | ICD-10-CM

## 2015-05-10 DIAGNOSIS — IMO0001 Reserved for inherently not codable concepts without codable children: Secondary | ICD-10-CM

## 2015-05-10 DIAGNOSIS — M79641 Pain in right hand: Secondary | ICD-10-CM | POA: Diagnosis not present

## 2015-05-10 DIAGNOSIS — M79642 Pain in left hand: Secondary | ICD-10-CM

## 2015-05-10 DIAGNOSIS — E538 Deficiency of other specified B group vitamins: Secondary | ICD-10-CM | POA: Diagnosis not present

## 2015-05-10 DIAGNOSIS — G63 Polyneuropathy in diseases classified elsewhere: Secondary | ICD-10-CM

## 2015-05-10 DIAGNOSIS — M3509 Sicca syndrome with other organ involvement: Secondary | ICD-10-CM

## 2015-05-10 MED ORDER — CYANOCOBALAMIN 1000 MCG/ML IJ SOLN
1000.0000 ug | Freq: Once | INTRAMUSCULAR | Status: AC
  Administered 2015-05-10: 1000 ug via INTRAMUSCULAR

## 2015-05-10 NOTE — Progress Notes (Addendum)
Follow-up Visit   Date: 05/10/2015    Krystal Clarke MRN: [id number] DOB: 02/28/1936   [name] is a delightful 80 y.o. right-handed Caucasian female with hypertension, Sjogren's syndrome (diagnosed 0000000) complicated by neuropathy and vision impairment, migraines, atrial fibrillation (on apixiban), history of shingles affecting left T6 (07/2009) and stroke (September 2014, residual word-finding difficulty) returning to the clinic with new complaints of abnormal shakes and paresthesias.    History of present illness: She was under the care of Dr. Erling Cruz at [facility] for a over 30 years for migraines, stroke, peripheral neuropathy, headaches, and gait instability. She has a long history of migraines or started at the age of 89. Her headaches were well controlled on Depakote 250 twice a day. However, it was stopped due to low platelets and anticoagulation. She complains of right-sided throbbing and pulsating pain. She has nausea, photophobia, and phonophobia.  Previously tried medication for headaches is depakote, Botox (2 trials without benefit) and maxalt. Being on apixiban, she is unable to take NSAIDs due to bleeding risk.  She was given a prednisone taper dose with dramatic improvement of headaches and has been relatively well since then.   She was admitted to the hospital in September 2014 for community-acquired pneumonia and found to have new onset atrial fibrillation. Due to worsening headaches, MRI of the brain was obtained in October which showed a small left thalamic subacute stroke.   - UPDATE 06/17/2014:  She reports having several falls since the beginning of the year when she was not using a walker. She had an interval fall in bedroom when trying to sit on the chair and missed it. She was unable to stand so called fire department helped her back to her chair, but she did not need to go to the hospital.  Falls seem to mostly occur at home when using her cane, no  falls when using her walker. She no longer has any migraines since increasing Lyrica.   - UPDATE 01/16/2015:  She feels that her headaches are better, but still low grade pain which responds to exedrin as needed.  She has noticed worsening of numbness of the feet up to the knees and also some of the hands.  No interval falls, but she is very cautious with walking. She feels that her legs are weaker and she had not been able to her home exercises by herself.  She also missed two vitamin B12 injections due to not getting a ride to the office.   - UPDATE 05/10/2015:  She presents today with two new issues (1) Around early January, she started having new complaints of intense sneezing for 5-7 times, followed by 5-minutes of bilateral arms shakes.  There is no loss of consciousness, weakness, numbness/tingling, or confusion. It usually occurs when she is sitting in her car. She is able to communicate during these spells. (2) She also has another sensation of both hands feeling cold and developing painful tingling, only occuring when sitting on the commode to void or defecate, which has been ongoing since earlier this year.  There is no weakness. This sensation is worse when she is straining.  These symptoms do not occur when sitting, sneezing, coughing, or laughing.  No interval falls or hospitalizations.  Neuropathy is well controlled on Lyrica 150mg -300mg .  Headaches continue to be mild and infrequent.   Medications:  Current Outpatient Prescriptions on File Prior to Visit  Medication Sig Dispense Refill  . apixaban (ELIQUIS) 5 MG TABS  tablet Take 1 tablet (5 mg total) by mouth 2 (two) times daily. 180 tablet 4  . clobetasol ointment (TEMOVATE) 0.05 %     . cyanocobalamin (,VITAMIN B-12,) 1000 MCG/ML injection Inject 1 mL (1,000 mcg total) into the muscle once. 1 mL 0  . cyclobenzaprine (FLEXERIL) 5 MG tablet TAKE 1 TABLET BY MOUTH AT BEDTIME 90 tablet 3  . diltiazem (CARDIZEM CD) 180 MG 24 hr capsule  Take 1 capsule (180 mg total) by mouth daily. 90 capsule 3  . dorzolamide-timolol (COSOPT) 22.3-6.8 MG/ML ophthalmic solution Place 1 drop into both eyes 2 (two) times daily.     Marland Kitchen doxycycline (VIBRAMYCIN) 50 MG capsule Take 1 capsule by mouth daily.  11  . fenofibrate (TRICOR) 145 MG tablet Take 1 tablet by mouth daily.  0  . FLUZONE HIGH-DOSE 0.5 ML SUSY   0  . furosemide (LASIX) 20 MG tablet One tablet daily as needed for swelling 90 tablet 3  . LORazepam (ATIVAN) 1 MG tablet Take 1 mg by mouth at bedtime as needed.   1  . nystatin (MYCOSTATIN) 100000 UNIT/ML suspension Take 100,000 Units by mouth daily.  3  . omeprazole (PRILOSEC) 40 MG capsule Take 40 mg by mouth daily.  0  . ondansetron (ZOFRAN) 4 MG tablet TAKE 1 TABLET (4 MG TOTAL) BY MOUTH EVERY 8 (EIGHT) HOURS AS NEEDED FOR NAUSEA OR VOMITING. 90 tablet 0  . ondansetron (ZOFRAN-ODT) 4 MG disintegrating tablet Take 1 tablet by mouth as needed.  1  . pilocarpine (SALAGEN) 5 MG tablet Take 5 mg by mouth 3 (three) times daily.    . pregabalin (LYRICA) 150 MG capsule Take one tablet in the morning and two tablets at bedtime. 240 capsule 3  . sucralfate (CARAFATE) 1 G tablet Take 1 g by mouth 4 (four) times daily.     . traMADol (ULTRAM) 50 MG tablet     . Vitamin D, Ergocalciferol, (DRISDOL) 50000 UNITS CAPS Take 50,000 Units by mouth every 7 (seven) days. Does not remember what day of the week she takes     No current facility-administered medications on file prior to visit.    Allergies:  Allergies  Allergen Reactions  . Codeine Nausea Only  . Hydrocodone Nausea And Vomiting  . Morphine And Related Nausea Only  . Oxycodone-Aspirin     REACTION: DEATHLY SICK-VOMITTING     Review of Systems:  CONSTITUTIONAL: No fevers, chills, night sweats, or weight loss.   EYES: +visual changes or eye pain ENT: No hearing changes.  No history of nose bleeds.   RESPIRATORY: No cough, wheezing and shortness of breath.   CARDIOVASCULAR:  Negative for chest pain, and palpitations.   GI: Negative for abdominal discomfort, blood in stools or black stools.  No recent change in bowel habits.   GU:  No history of incontinence.  +hematuria MUSCLOSKELETAL: No history of joint pain or swelling.  No myalgias.   SKIN: +for lesions, rash, and itching.   ENDOCRINE: Negative for cold or heat intolerance, polydipsia or goiter.   PSYCH:  No depression or anxiety symptoms.   NEURO: As Above.   Vital Signs:  BP 118/80 mmHg  Pulse 88  Wt 190 lb 6 oz (86.354 kg)  SpO2 96%  Neurological Exam: MENTAL STATUS including orientation to time, place, person, recent and remote memory, attention span and concentration, language, and fund of knowledge is fair.   Speech is not dysarthric.  CRANIAL NERVES:  Severe visual impairment bilaterally, she is only  able to see colors. Irregular pupils, but reactive to light.  Mild bilateral ptosis. Face is symmetric. Palate elevates symmetrically.  Tongue is midline.  Dry oral mucosa and tongue.   MOTOR: Motor strength is 5-/5 throughout, and RLE is 4/5 (worse).  Tone is normal.   SENSATION:  Pin prick, temperature, and vibration intact in the upper extremities and absent distal to knees bilaterally.   REFLEXES:  Reflexes are 2+/4 throughout, except absent Achilles reflexes bilaterally.    COORDINATION/GAIT: Gait is moderately wide-based with small, short steps, stooped posture and assisted with walker.   Data: Labs 12/17/2012: Na 134, K 4.4, Chloride 99, Ca 10.4, WBC 7.9, RBC 4.3, Hbg 12.2, Hct 36.9  MRI brain wo contrast 12/23/2012: Very small acute/ subacute left thalamic nonhemorrhagic infarct. Prominent small vessel disease type changes. No intracranial hemorrhage. Mild atrophy without hydrocephalus.  Prior clinic notes from Dr. Rexene Alberts, previous neurological workup includes:  EEG 06/03/1995 and 08/31/2008: normal  CSF 1991: normal  Lab Results  Component Value Date   VITAMINB12 127* 04/15/2013   Lab  Results  Component Value Date   TSH 0.834 12/06/2012     IMPRESSION/PLAN:   1. Spells of sneezing with associated abnormal hand movements - These are not suggeestive seizures based on history.  For completeness, I will order routine EEG.  Given that symptoms only occur in her car, I question the possibility of some type of hypersensitivity/allergy - Check US carotids  2.  Abnormal painful paresthesias of the hands always associated with voiding/defecating Unusual presentation She does not have evidence of Chiari malformation on her old MRI brain  She may certainly have neuropathy of hands, but it would be unusual to worsen in this particular setting.  NCS/EMG of the upper extremities will be performed to look for acute denervation MRI cervical spine can be considered going forward to look for structural lesion such as syrinx If neurological work-up is negative, consider vascular studies of the upper extremities, but I will let her cardiologist decide whether this would be appropriate given her atypical symptoms  3. Migraine without aura, chronic daily headaches Previously used:  VPA (stopped due to low PLT), triptans (stroke), flexeril (no improvement), botox She had significant benefit with trial of medrol dose pak Clinically stable on Lyrica 150mg  in the morning and 300mg  at bedtime Still with episodic tension headaches which she treats as needed  4. Vitamin B12 deficiency, Continue B12 injections (monthly), administered today  5.  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    6. Multifactorial gait instability with generalized weakness Walks with a 4-wheeled rollator.  She has fallen three times in 2016, no interval falls.   7. Sjogren's disease complicated by severe bilateral  vision loss   8. Left thalamic stroke, likely cardioembolic due to afib on apixiban, followed by Dr. Stanford Breed  9. Age-related memory loss possibly transitioning into vascular dementia Neuropsychological testing 05/25/2010: Decreased verbal fluency, otherwise normal exam.   Return to clinic in 70-months or sooner  The duration of this appointment visit was 40 minutes of face-to-face time with the patient.  Greater than 50% of this time was spent in counseling, explanation of diagnosis, planning of further management, and coordination of care.   Thank you for allowing me to participate in patient's care.  If I can answer any additional questions, I would be pleased  to do so.    Sincerely,    Donika K. Posey Pronto, DO

## 2015-05-10 NOTE — Progress Notes (Signed)
Patient in for B12 injection. 

## 2015-05-10 NOTE — Patient Instructions (Signed)
1.  Routine EEG 2.  US carotids 3.  NCS/EMG of the upper extremities   Return to clinic in 6 - 8 weeks

## 2015-05-11 ENCOUNTER — Telehealth: Payer: Self-pay | Admitting: Neurology

## 2015-05-11 ENCOUNTER — Ambulatory Visit (HOSPITAL_COMMUNITY)
Admission: RE | Admit: 2015-05-11 | Discharge: 2015-05-11 | Disposition: A | Discharge status: Home / Self Care | Payer: Medicare Other | Source: Ambulatory Visit | Attending: Neurology | Admitting: Neurology

## 2015-05-11 DIAGNOSIS — R6889 Other general symptoms and signs: Secondary | ICD-10-CM | POA: Insufficient documentation

## 2015-05-11 DIAGNOSIS — I10 Essential (primary) hypertension: Secondary | ICD-10-CM | POA: Diagnosis not present

## 2015-05-11 DIAGNOSIS — R42 Dizziness and giddiness: Secondary | ICD-10-CM

## 2015-05-11 DIAGNOSIS — I6523 Occlusion and stenosis of bilateral carotid arteries: Secondary | ICD-10-CM | POA: Insufficient documentation

## 2015-05-11 DIAGNOSIS — IMO0001 Reserved for inherently not codable concepts without codable children: Secondary | ICD-10-CM

## 2015-05-11 NOTE — Telephone Encounter (Signed)
Per Hinton Dyer patient no longer needs a call back, the appt automated system was calling patient.

## 2015-05-11 NOTE — Telephone Encounter (Signed)
Please call patient back at [phone] they are not sure who or why someone from our office called them

## 2015-05-15 ENCOUNTER — Ambulatory Visit (INDEPENDENT_AMBULATORY_CARE_PROVIDER_SITE_OTHER): Payer: Medicare Other | Admitting: Neurology

## 2015-05-15 DIAGNOSIS — R6889 Other general symptoms and signs: Secondary | ICD-10-CM | POA: Diagnosis not present

## 2015-05-15 DIAGNOSIS — R42 Dizziness and giddiness: Secondary | ICD-10-CM

## 2015-05-15 DIAGNOSIS — IMO0001 Reserved for inherently not codable concepts without codable children: Secondary | ICD-10-CM

## 2015-05-19 DIAGNOSIS — R3989 Other symptoms and signs involving the genitourinary system: Secondary | ICD-10-CM | POA: Diagnosis not present

## 2015-05-19 DIAGNOSIS — K5792 Diverticulitis of intestine, part unspecified, without perforation or abscess without bleeding: Secondary | ICD-10-CM | POA: Diagnosis not present

## 2015-05-19 DIAGNOSIS — R933 Abnormal findings on diagnostic imaging of other parts of digestive tract: Secondary | ICD-10-CM | POA: Diagnosis not present

## 2015-05-19 DIAGNOSIS — R110 Nausea: Secondary | ICD-10-CM | POA: Diagnosis not present

## 2015-05-23 ENCOUNTER — Telehealth: Payer: Self-pay | Admitting: *Deleted

## 2015-05-23 NOTE — Telephone Encounter (Signed)
Pt is needing clearance for colonoscopy and the okay to hold eliquis for the procedure. Will forward for dr Stanford Breed review

## 2015-05-23 NOTE — Telephone Encounter (Signed)
This note will be faxed to the number provided. 

## 2015-05-23 NOTE — Telephone Encounter (Signed)
Hold apixaban 2 days prior to procedure and resume day after Hilliary Jock  

## 2015-05-25 DIAGNOSIS — N321 Vesicointestinal fistula: Secondary | ICD-10-CM | POA: Diagnosis not present

## 2015-05-25 DIAGNOSIS — R296 Repeated falls: Secondary | ICD-10-CM | POA: Diagnosis not present

## 2015-05-25 DIAGNOSIS — G629 Polyneuropathy, unspecified: Secondary | ICD-10-CM | POA: Diagnosis not present

## 2015-05-25 DIAGNOSIS — N390 Urinary tract infection, site not specified: Secondary | ICD-10-CM | POA: Diagnosis not present

## 2015-05-25 NOTE — Procedures (Signed)
ELECTROENCEPHALOGRAM REPORT  Date of Study: 05/15/2015  Patient's Name: Krystal Clarke MRN: [id number] Date of Birth: 26-Jan-1936  Referring Provider: Dr. Narda Amber  Clinical History: This is a 80 year old woman with episodes of shaking after intense sneezing.  Medications: Eliquis, Temovate, Vitanin B-12, Flexeril, Cardizem CD, Cosopt, Tricor, Vibramycin, Lasix, Ativan, Mycostatin, Prilosec, Salagen, Carafate, Ultram and Zofran  Technical Summary: A multichannel digital EEG recording measured by the international 10-20 system with electrodes applied with paste and impedances below 5000 ohms performed in our laboratory with EKG monitoring in an awake and asleep patient.  Hyperventilation was not performed. Photic stimulation was performed.  The digital EEG was referentially recorded, reformatted, and digitally filtered in a variety of bipolar and referential montages for optimal display.    Description: The patient is awake and asleep during the recording.  During maximal wakefulness, there is a symmetric, medium voltage 8 Hz posterior dominant rhythm that attenuates with eye opening.  The record is symmetric.  During drowsiness and stage I sleep, there is an increase in theta and delta slowing of the background, with shifting asymmetry over the bilateral temporal regions. Occasional vertex waves were seen. Photic stimulation did not elicit any abnormalities.  There were no epileptiform discharges or electrographic seizures seen.    EKG lead showed irregular rhythm.  Impression: This awake and asleep EEG is within normal limits for age.  Clinical Correlation: A normal EEG does not exclude a clinical diagnosis of epilepsy. Clinical correlation is advised.   Ellouise Newer, M.D.

## 2015-06-02 DIAGNOSIS — M5136 Other intervertebral disc degeneration, lumbar region: Secondary | ICD-10-CM | POA: Diagnosis not present

## 2015-06-06 ENCOUNTER — Encounter: Payer: Medicare Other | Admitting: Neurology

## 2015-06-06 ENCOUNTER — Telehealth: Payer: Self-pay | Admitting: Cardiology

## 2015-06-06 ENCOUNTER — Other Ambulatory Visit: Payer: Self-pay | Admitting: Neurology

## 2015-06-06 ENCOUNTER — Ambulatory Visit (INDEPENDENT_AMBULATORY_CARE_PROVIDER_SITE_OTHER): Payer: Medicare Other | Admitting: *Deleted

## 2015-06-06 DIAGNOSIS — E538 Deficiency of other specified B group vitamins: Secondary | ICD-10-CM | POA: Diagnosis not present

## 2015-06-06 MED ORDER — CYANOCOBALAMIN 1000 MCG/ML IJ SOLN
1000.0000 ug | Freq: Once | INTRAMUSCULAR | Status: AC
  Administered 2015-06-06: 1000 ug via INTRAMUSCULAR

## 2015-06-06 NOTE — Telephone Encounter (Signed)
Recently refilled medications clarified, & advised to call pharmacy if further clarification needed on potential formulary change.

## 2015-06-06 NOTE — Telephone Encounter (Signed)
Mrs. Krystal Clarke is calling because she is concerned about taking a green capsule ( medication she does not know the name of ) instead of taking a flat round pill. Please call   Thanks

## 2015-06-06 NOTE — Progress Notes (Signed)
Patient in for B12 injection. 

## 2015-06-07 ENCOUNTER — Encounter (HOSPITAL_COMMUNITY): Payer: Self-pay | Admitting: *Deleted

## 2015-06-07 NOTE — Progress Notes (Signed)
Anesthesia Chart Review: SAME DAY WORK-UP.  Patient is a 80 year old female scheduled for EGD and colonoscopy with propofol on 06/09/15 by Dr. Cristina Gong.   History includes former smoker, afib, Sjogren's syndrome, HTN, gait disorder, CVA, GERD, blindness right eye, glaucoma, psoriasis, peripheral neuropathy, impaired glucose intolerance, migraine triggered seizure, T&A, hysterectomy, lumbar laminectomy, bone tumor excision. BMI is consistent with obesity. PCP is Dr. Harlan Stains.   Cardiologist is Dr. Kirk Ruths, last visit 04/05/15. Patient was not interested in cardioversion, so rate control and anticoagulation were the goals for management of her afib. He gave permission for patient to hold apixaban two days prior to this procedure.  Meds include Eliiquis, Flexeril, diltiazem, Cosopt, doxycycline, Tricor, Lasix, Ativan, Lyrica, Prilosec, Zofran, pilocarpine, Carafate.  04/05/15/ EKG: Afib at 98 bpm, non-specific ST/T wave abnormality.  05/03/15 24 hour Holter monitor: Sustained afib with occurrences of RVR.  11/16/14 Echo: Study Conclusions - Left ventricle: The cavity size was normal. Systolic function was  normal. The estimated ejection fraction was in the range of 55%  to 60%. Wall motion was normal; there were no regional wall  motion abnormalities.  11/16/14 Myocardia Perfusion Imaging:  Nuclear stress EF: 65%.  The study is normal. no evidence of ischemia. Normal LV function  This is a low risk study.  The left ventricular ejection fraction is normal (55-65%).  05/11/15 Carotid U/S: Heterogeneous plaque, bilaterally. 1-39% bilateral ICA stenosis. Normal SCAs bilaterally. Patent vertebral arteries with antegrade flow.   06/06/15 EEG: Impression:This awake and asleep EEG is within normal limits for age. A normal EEG does not exclude a clinical diagnosis of epilepsy. Clinical correlation is advised.  10/20/14 CXR: IMPRESSION: No active cardiopulmonary disease.  Labs pending the  day of her procedure.   Anesthesiologist to evaluate on the day of surgery to ensure afib is rate controlled and no acute changes prior to proceeding.  [facility] Stay [facility]/Anesthesiology Phone [phone] 06/07/2015 10:31 AM

## 2015-06-07 NOTE — Progress Notes (Signed)
Pt denies SOB and chest pain but is under the care of Dr. Stanford Breed, Cardiology. Pt stated that her last dose of Eliquis was this morning as instructed by MD. Pt made aware to stop taking vitamins, fish oil and herbal medications. Do not take any NSAIDs ie: Ibuprofen, Advil, Naproxen, BC and Goody Powder or any medication containing Aspirin. Pt stated, " I am only going to take my Diltiazem  the morning of surgery because I need a lot of water with my other medicine."  Pt verbalized understanding of all pre-op instructions. Anesthesia asked to review pt history ( see note).

## 2015-06-09 ENCOUNTER — Ambulatory Visit (HOSPITAL_COMMUNITY)
Admission: RE | Admit: 2015-06-09 | Discharge: 2015-06-09 | Disposition: A | Discharge status: Home / Self Care | Payer: Medicare Other | Source: Ambulatory Visit | Attending: Gastroenterology | Admitting: Gastroenterology

## 2015-06-09 ENCOUNTER — Encounter (HOSPITAL_COMMUNITY): Payer: Self-pay

## 2015-06-09 ENCOUNTER — Ambulatory Visit (HOSPITAL_COMMUNITY): Payer: Medicare Other | Admitting: Vascular Surgery

## 2015-06-09 ENCOUNTER — Encounter (HOSPITAL_COMMUNITY)
Admission: RE | Disposition: A | Discharge status: Home / Self Care | Payer: Self-pay | Source: Ambulatory Visit | Attending: Gastroenterology

## 2015-06-09 ENCOUNTER — Other Ambulatory Visit: Payer: Self-pay | Admitting: Gastroenterology

## 2015-06-09 DIAGNOSIS — Z7901 Long term (current) use of anticoagulants: Secondary | ICD-10-CM | POA: Diagnosis not present

## 2015-06-09 DIAGNOSIS — R110 Nausea: Secondary | ICD-10-CM | POA: Insufficient documentation

## 2015-06-09 DIAGNOSIS — Z90710 Acquired absence of both cervix and uterus: Secondary | ICD-10-CM | POA: Diagnosis not present

## 2015-06-09 DIAGNOSIS — R933 Abnormal findings on diagnostic imaging of other parts of digestive tract: Secondary | ICD-10-CM | POA: Diagnosis not present

## 2015-06-09 DIAGNOSIS — I10 Essential (primary) hypertension: Secondary | ICD-10-CM | POA: Insufficient documentation

## 2015-06-09 DIAGNOSIS — K5790 Diverticulosis of intestine, part unspecified, without perforation or abscess without bleeding: Secondary | ICD-10-CM | POA: Diagnosis not present

## 2015-06-09 DIAGNOSIS — K5730 Diverticulosis of large intestine without perforation or abscess without bleeding: Secondary | ICD-10-CM | POA: Insufficient documentation

## 2015-06-09 DIAGNOSIS — Z8673 Personal history of transient ischemic attack (TIA), and cerebral infarction without residual deficits: Secondary | ICD-10-CM | POA: Diagnosis not present

## 2015-06-09 DIAGNOSIS — K838 Other specified diseases of biliary tract: Secondary | ICD-10-CM | POA: Diagnosis not present

## 2015-06-09 DIAGNOSIS — Z87891 Personal history of nicotine dependence: Secondary | ICD-10-CM | POA: Insufficient documentation

## 2015-06-09 DIAGNOSIS — K449 Diaphragmatic hernia without obstruction or gangrene: Secondary | ICD-10-CM | POA: Diagnosis not present

## 2015-06-09 DIAGNOSIS — G629 Polyneuropathy, unspecified: Secondary | ICD-10-CM | POA: Diagnosis not present

## 2015-06-09 HISTORY — PX: ESOPHAGOGASTRODUODENOSCOPY (EGD) WITH PROPOFOL: SHX5813

## 2015-06-09 HISTORY — DX: Sleep apnea, unspecified: G47.30

## 2015-06-09 HISTORY — DX: Pneumonia, unspecified organism: J18.9

## 2015-06-09 HISTORY — PX: COLONOSCOPY WITH PROPOFOL: SHX5780

## 2015-06-09 HISTORY — DX: Legal blindness, as defined in USA: H54.8

## 2015-06-09 SURGERY — ESOPHAGOGASTRODUODENOSCOPY (EGD) WITH PROPOFOL
Anesthesia: Monitor Anesthesia Care

## 2015-06-09 MED ORDER — LACTATED RINGERS IV SOLN
INTRAVENOUS | Status: DC
  Administered 2015-06-09: 1000 mL via INTRAVENOUS
  Administered 2015-06-09: 12:00:00 via INTRAVENOUS

## 2015-06-09 MED ORDER — PROPOFOL 10 MG/ML IV BOLUS
INTRAVENOUS | Status: DC | PRN
  Administered 2015-06-09: 10 mg via INTRAVENOUS

## 2015-06-09 MED ORDER — SODIUM CHLORIDE 0.9 % IV SOLN: INTRAVENOUS | Status: DC

## 2015-06-09 MED ORDER — PROPOFOL 500 MG/50ML IV EMUL
INTRAVENOUS | Status: DC | PRN
  Administered 2015-06-09: 80 ug/kg/min via INTRAVENOUS

## 2015-06-09 NOTE — Discharge Instructions (Signed)
Colonoscopy, Care After °Refer to this sheet in the next few weeks. These instructions provide you with information on caring for yourself after your procedure. Your health care provider may also give you more specific instructions. Your treatment has been planned according to current medical practices, but problems sometimes occur. Call your health care provider if you have any problems or questions after your procedure. °WHAT TO EXPECT AFTER THE PROCEDURE  °After your procedure, it is typical to have the following: °· A small amount of blood in your stool. °· Moderate amounts of gas and mild abdominal cramping or bloating. °HOME CARE INSTRUCTIONS °· Do not drive, operate machinery, or sign important documents for 24 hours. °· You may shower and resume your regular physical activities, but move at a slower pace for the first 24 hours. °· Take frequent rest periods for the first 24 hours. °· Walk around or put a warm pack on your abdomen to help reduce abdominal cramping and bloating. °· Drink enough fluids to keep your urine clear or pale yellow. °· You may resume your normal diet as instructed by your health care provider. Avoid heavy or fried foods that are hard to digest. °· Avoid drinking alcohol for 24 hours or as instructed by your health care provider. °· Only take over-the-counter or prescription medicines as directed by your health care provider. °· If a tissue sample (biopsy) was taken during your procedure: °¨ Do not take aspirin or blood thinners for 7 days, or as instructed by your health care provider. °¨ Do not drink alcohol for 7 days, or as instructed by your health care provider. °¨ Eat soft foods for the first 24 hours. °SEEK MEDICAL CARE IF: °You have persistent spotting of blood in your stool 2-3 days after the procedure. °SEEK IMMEDIATE MEDICAL CARE IF: °· You have more than a small spotting of blood in your stool. °· You pass large blood clots in your stool. °· Your abdomen is swollen  (distended). °· You have nausea or vomiting. °· You have a fever. °· You have increasing abdominal pain that is not relieved with medicine. °  °This information is not intended to replace advice given to you by your health care provider. Make sure you discuss any questions you have with your health care provider. °  °Document Released: 10/17/2003 Document Revised: 12/23/2012 Document Reviewed: 11/09/2012 °Elsevier Interactive Patient Education ©2016 [employer] ° °

## 2015-06-09 NOTE — Transfer of Care (Signed)
Immediate Anesthesia Transfer of Care Note  Patient: Krystal Clarke  Procedure(s) Performed: Procedure(s): ESOPHAGOGASTRODUODENOSCOPY (EGD) WITH PROPOFOL (N/A) COLONOSCOPY WITH PROPOFOL (N/A)  Patient Location: PACU and Endoscopy Unit  Anesthesia Type:MAC  Level of Consciousness: awake, alert , oriented and patient cooperative  Airway & Oxygen Therapy: Patient Spontanous Breathing and Patient connected to nasal cannula oxygen  Post-op Assessment: Report given to RN, Post -op Vital signs reviewed and stable and Patient moving all extremities X 4  Post vital signs: Reviewed and stable  Last Vitals:  Filed Vitals:   06/09/15 1046  BP: 120/85  Pulse: 89  Resp: 19    Complications: No apparent anesthesia complications

## 2015-06-09 NOTE — Anesthesia Preprocedure Evaluation (Addendum)
Anesthesia Evaluation  Patient identified by MRN, date of birth, ID band Patient awake    Reviewed: Allergy & Precautions, H&P , NPO status , Patient's Chart, lab work & pertinent test results  History of Anesthesia Complications Negative for: history of anesthetic complications  Airway Mallampati: II  TM Distance: >3 FB Neck ROM: full    Dental no notable dental hx.    Pulmonary sleep apnea , former smoker,    Pulmonary exam normal breath sounds clear to auscultation       Cardiovascular hypertension, Normal cardiovascular exam Rhythm:regular Rate:Normal  11/16/14 Myocardia Perfusion Imaging:     Nuclear stress EF: 65%.     The study is normal. no evidence of ischemia. Normal LV function     This is a low risk study.     The left ventricular ejection fraction is normal (55-65%).   Neuro/Psych  Headaches, Anxiety CVA    GI/Hepatic Neg liver ROS, GERD  ,  Endo/Other  negative endocrine ROS  Renal/GU negative Renal ROS     Musculoskeletal  (+) Arthritis ,   Abdominal   Peds  Hematology negative hematology ROS (+)   Anesthesia Other Findings   Reproductive/Obstetrics negative OB ROS                            Anesthesia Physical Anesthesia Plan  ASA: III  Anesthesia Plan: MAC   Post-op Pain Management:    Induction: Intravenous  Airway Management Planned: Nasal Cannula  Additional Equipment:   Intra-op Plan:   Post-operative Plan:   Informed Consent: I have reviewed the patients History and Physical, chart, labs and discussed the procedure including the risks, benefits and alternatives for the proposed anesthesia with the patient or authorized representative who has indicated his/her understanding and acceptance.   Dental Advisory Given  Plan Discussed with: Anesthesiologist, CRNA and Surgeon  Anesthesia Plan Comments:         Anesthesia Quick Evaluation

## 2015-06-09 NOTE — Anesthesia Postprocedure Evaluation (Signed)
Anesthesia Post Note  Patient: Krystal Clarke  Procedure(s) Performed: Procedure(s) (LRB): ESOPHAGOGASTRODUODENOSCOPY (EGD) WITH PROPOFOL (N/A) COLONOSCOPY WITH PROPOFOL (N/A)  Patient location during evaluation: PACU Anesthesia Type: MAC Level of consciousness: awake and alert Pain management: pain level controlled Vital Signs Assessment: post-procedure vital signs reviewed and stable Respiratory status: spontaneous breathing, nonlabored ventilation, respiratory function stable and patient connected to nasal cannula oxygen Cardiovascular status: stable and blood pressure returned to baseline Anesthetic complications: no    Last Vitals:  Filed Vitals:   06/09/15 1340 06/09/15 1350  BP: 128/85 138/76  Pulse: 76 77  Temp:    Resp: 18 17    Last Pain: There were no vitals filed for this visit.               Zenaida Deed

## 2015-06-09 NOTE — H&P (Signed)
Krystal Clarke is an 80 y.o. female.   Chief Complaint: nausea, pneumaturia w/ evid of colo-vaginal and colorectal fistulae.  HPI: Pt has h/o diverticulitis in remote past (none recently), w/ incidental finding of air in bladder on CT (done for w/u of nausea) several months ago.  Pt repts pneumaturia and stool per vagina.  She continues to be bothered by nausea.  She is on PPI therapy and not on any ulcerogenic meds.  Past Medical History  Diagnosis Date  . Hypertension   . Peripheral neuropathy ([place])   . Sjogren's syndrome ([place])     bilateral vision  . Gait disorder 06/05/2012  . Migraines   . Paroxysmal atrial fibrillation ([place]) 12/06/2012    Echo (12/07/12): Moderate focal basal hypertrophy of the septum, vigorous LV function, EF 65-70%, indeterminate diastolic function, normal wall motion, trivial MR, moderate LAE, trivial TR;  Xarelto started 11/2012  . Osteoarthritis   . Stroke ([place])   . Allergy   . GERD (gastroesophageal reflux disease)   . OP (osteoporosis)   . Blindness of right eye   . Glucose intolerance (impaired glucose tolerance)   . History of diverticulitis   . Neuropathy ([place])   . Migraine triggered seizures     history of  . Glaucoma   . Siriasis   . Sleep apnea     does not wear CPAP  . Legally blind   . Pneumonia     Past Surgical History  Procedure Laterality Date  . Shoulder open rotator cuff repair    . Lumbar laminectomy    . Total abdominal hysterectomy w/ bilateral salpingoophorectomy    . Eye surgery    . Knee arthroscopy Right   . Cataract extraction    . Bone tumor excision Right     arm  . Tonsillectomy and adenoidectomy    . Foot fracture surgery Left 2007  . Dilation and curettage of uterus      Family History  Problem Relation Age of Onset  . Stomach cancer Father   . Stroke Mother   . Sjogren's syndrome Father   . Sjogren's syndrome Brother   . Sjogren's syndrome Brother   . Scleroderma Sister     raynaud's scleroderma  .  Lymphoma Daughter   . Migraines Mother   . Migraines Daughter   . Heart disease      maternal side  . Stroke      maternal side, 1st degree female <60   Social History:  reports that she has quit smoking. Her smoking use included Cigarettes. She has never used smokeless tobacco. She reports that she does not drink alcohol or use illicit drugs.  Allergies:  Allergies  Allergen Reactions  . Codeine Nausea Only  . Hydrocodone Nausea And Vomiting  . Morphine And Related Nausea Only  . Oxycodone-Aspirin     REACTION: DEATHLY SICK-VOMITTING    Medications Prior to Admission  Medication Sig Dispense Refill  . apixaban (ELIQUIS) 5 MG TABS tablet Take 1 tablet (5 mg total) by mouth 2 (two) times daily. 180 tablet 4  . cyanocobalamin (,VITAMIN B-12,) 1000 MCG/ML injection Inject 1 mL (1,000 mcg total) into the muscle once. 1 mL 0  . cyclobenzaprine (FLEXERIL) 5 MG tablet TAKE 1 TABLET BY MOUTH AT BEDTIME 90 tablet 3  . diltiazem (CARDIZEM CD) 180 MG 24 hr capsule Take 1 capsule (180 mg total) by mouth daily. 90 capsule 3  . dorzolamide-timolol (COSOPT) 22.3-6.8 MG/ML ophthalmic solution Place 1 drop into both  eyes 2 (two) times daily.     . fenofibrate (TRICOR) 145 MG tablet Take 1 tablet by mouth daily.  0  . furosemide (LASIX) 20 MG tablet One tablet daily as needed for swelling 90 tablet 3  . LORazepam (ATIVAN) 1 MG tablet Take 1 mg by mouth at bedtime as needed.   1  . LYRICA 150 MG capsule TAKE ONE CAPSULE BY MOUTH EVERY MORNING & 2 AT BEDTIME 270 capsule 0  . nystatin (MYCOSTATIN) 100000 UNIT/ML suspension Take 100,000 Units by mouth daily.  3  . omeprazole (PRILOSEC) 40 MG capsule Take 40 mg by mouth daily.  0  . ondansetron (ZOFRAN-ODT) 4 MG disintegrating tablet Take 1 tablet by mouth as needed.  1  . pilocarpine (SALAGEN) 5 MG tablet Take 5 mg by mouth 3 (three) times daily.    . sucralfate (CARAFATE) 1 G tablet Take 1 g by mouth 4 (four) times daily.     . clobetasol ointment  (TEMOVATE) 0.05 %     . doxycycline (VIBRAMYCIN) 50 MG capsule Take 1 capsule by mouth daily.  11  . FLUZONE HIGH-DOSE 0.5 ML SUSY   0  . ondansetron (ZOFRAN) 4 MG tablet TAKE 1 TABLET (4 MG TOTAL) BY MOUTH EVERY 8 (EIGHT) HOURS AS NEEDED FOR NAUSEA OR VOMITING. 90 tablet 0  . traMADol (ULTRAM) 50 MG tablet     . Vitamin D, Ergocalciferol, (DRISDOL) 50000 UNITS CAPS Take 50,000 Units by mouth every 7 (seven) days. Does not remember what day of the week she takes      No results found for this or any previous visit (from the past 48 hour(s)). No results found.  ROS  See HPI  Blood pressure 120/85, pulse 89, resp. rate 19, height 5\' 1"  (1.549 m), weight 86.183 kg (190 lb), SpO2 95 %.   Physical Exam Pleasant, cognitively-intact elderly white female in NAD.  Chest clr, heart nl, abd NT, oroph benign (dentures).  Assessment/Plan 1.  Nausea--egd 2.  Colo-vaginal and colo-vesical fistulae--colonoscopy  Cleotis Nipper, MD 06/09/2015, 1:04 PM

## 2015-06-09 NOTE — Op Note (Signed)
[facility] Patient Name: Krystal Clarke Procedure Date : 06/09/2015 MRN: [id number] Attending MD: Ronald Lobo , MD Date of Birth: [birth year] CSN: [id number] Age: 80 Admit Type: Outpatient Procedure:                Upper GI endoscopy Indications:              Nausea Providers:                Ronald Lobo, MD, Alfonso Patten, Technician, Trixie Deis, CRNA, Laverta Baltimore, RN Referring MD:              Medicines:                Propofol per Anesthesia Complications:            No immediate complications. Estimated Blood Loss:     Estimated blood loss: none. Procedure:                Pre-Anesthesia Assessment:                           - Prior to the procedure, a History and Physical                            was performed, and patient medications and                            allergies were reviewed. The patient's tolerance of                            previous anesthesia was also reviewed. The risks                            and benefits of the procedure and the sedation                            options and risks were discussed with the patient.                            All questions were answered, and informed consent                            was obtained. Prior Anticoagulants: The patient has                            taken Eliquis (apixaban), last dose was 2 days                            prior to procedure. ASA Grade Assessment: III - A                            patient with severe systemic disease. After  reviewing the risks and benefits, the patient was                            deemed in satisfactory condition to undergo the                            procedure.                           After obtaining informed consent, the endoscope was                            passed under direct vision. Throughout the                            procedure, the patient's blood pressure, pulse, and                   oxygen saturations were monitored continuously. The                            [id number]) scope was introduced through the                            mouth, and advanced to the second part of duodenum.                            The upper GI endoscopy was accomplished without                            difficulty. The patient tolerated the procedure                            well. Scope In: Scope Out: Findings:      The examined esophagus was normal.      Bilious fluid was found on the greater curvature of the stomach.      Patchy mildly erythematous mucosa was found in the gastric fundus.      The exam of the stomach was otherwise normal.      The cardia and gastric fundus were normal on retroflexion.      The examined duodenum was normal. Impression:               - Normal esophagus.                           - Bilious gastric fluid.                           - Erythematous mucosa in the gastric fundus.                           - Normal examined duodenum.                           - No specimens collected. Moderate Sedation:      Moderate (conscious) sedation was personally administered by an  anesthesia professional. The following parameters were monitored: oxygen       saturation, heart rate, blood pressure, and response to care. Total       physician intraservice time was 7 minutes. Recommendation:           - Continue present medications.                           - Perform a colonoscopy today.                           - Resume previous diet. Procedure Code(s):        --- Professional ---                           [phone], Esophagogastroduodenoscopy, flexible,                            transoral; diagnostic, including collection of                            specimen(s) by brushing or washing, when performed                            (separate procedure) Diagnosis Code(s):        --- Professional ---                           R11.0, Nausea CPT  copyright 2016 American Medical Association. All rights reserved. The codes documented in this report are preliminary and upon coder review may  be revised to meet current compliance requirements. Ronald Lobo, MD  Ronald Lobo, MD 06/09/2015 1:12:19 PM This report has been signed electronically. Number of Addenda: 0

## 2015-06-09 NOTE — Op Note (Signed)
[facility] Patient Name: Krystal Clarke Procedure Date : 06/09/2015 MRN: [id number] Attending MD: Ronald Lobo , MD Date of Birth: Jun 19, 1935 CSN: [id number] Age: 80 Admit Type: Outpatient Procedure:                Colonoscopy Indications:              Abnormal CT of the GI tract Providers:                Ronald Lobo, MD, Alfonso Patten, Technician, Trixie Deis, CRNA, Laverta Baltimore, RN Referring MD:              Medicines:                Propofol per Anesthesia Complications:            No immediate complications. Estimated Blood Loss:     Estimated blood loss: none. Procedure:                Pre-Anesthesia Assessment:                           - Prior to the procedure, a History and Physical                            was performed, and patient medications and                            allergies were reviewed. The patient's tolerance of                            previous anesthesia was also reviewed. The risks                            and benefits of the procedure and the sedation                            options and risks were discussed with the patient.                            All questions were answered, and informed consent                            was obtained. Prior Anticoagulants: The patient has                            taken Eliquis (apixaban), last dose was 2 days                            prior to procedure. ASA Grade Assessment: III - A                            patient with severe systemic disease. After  reviewing the risks and benefits, the patient was                            deemed in satisfactory condition to undergo the                            procedure.                           After obtaining informed consent, the colonoscope                            was passed under direct vision. Throughout the                            procedure, the patient's blood pressure, pulse,  and                            oxygen saturations were monitored continuously. The                            [id number]) scope was introduced through                            the anus and advanced to the the cecum, identified                            by the ileocecal valve and absence of further                            lumen. The colonoscopy was somewhat difficult due                            to significant looping. Successful completion of                            the procedure was aided by using manual pressure.                            The patient tolerated the procedure well. The                            quality of the bowel preparation was excellent. The                            ileocecal valve and the rectum were photographed. Scope In: 12:40:56 PM Scope Out: 12:58:03 PM Scope Withdrawal Time: 0 hours 5 minutes 59 seconds  Total Procedure Duration: 0 hours 17 minutes 7 seconds  Findings:      Multiple medium-mouthed diverticula were found in the left colon.      No other significant abnormalities were identified in a careful       examination of the remainder of the colon.      There is no endoscopic evidence of inflammation, mass, polyps or  fistulas in the entire colon.      The retroflexed view of the distal rectum and anal verge was normal and       showed no anal or rectal abnormalities. Reinspection of the rectum       showed no additional findings. Impression:               - Extensive diverticulosis in the left colon.                           - The distal rectum and anal verge are normal on                            retroflexion view.                           - No specimens collected.                           - No fistulous opening seen. No alternative cause,                            other than diverticulosis, to explain patient's                            fistulae. Moderate Sedation:      Moderate (conscious) sedation was personally  administered by an       anesthesia professional. The following parameters were monitored: oxygen       saturation, heart rate, blood pressure, and response to care. Total       physician intraservice time was 20 minutes. Recommendation:           - Refer to a surgeon.                           - Resume previous diet.                           - Continue present medications.                           - Resume Eliquis (apixaban) at prior dose today.                           - Repeat colonoscopy is not recommended for                            screening purposes. Procedure Code(s):        --- Professional ---                           [phone], Colonoscopy, flexible; diagnostic, including                            collection of specimen(s) by brushing or washing,                            when performed (separate procedure) Diagnosis  Code(s):        --- Professional ---                           K57.30, Diverticulosis of large intestine without                            perforation or abscess without bleeding                           R93.3, Abnormal findings on diagnostic imaging of                            other parts of digestive tract CPT copyright 2016 American [employer] Association. All rights reserved. The codes documented in this report are preliminary and upon coder review may  be revised to meet current compliance requirements. Ronald Lobo, MD  Ronald Lobo, MD 06/09/2015 1:20:35 PM This report has been signed electronically. Number of Addenda: 0

## 2015-06-11 ENCOUNTER — Encounter (HOSPITAL_COMMUNITY): Payer: Self-pay | Admitting: Gastroenterology

## 2015-06-22 DIAGNOSIS — H401133 Primary open-angle glaucoma, bilateral, severe stage: Secondary | ICD-10-CM | POA: Diagnosis not present

## 2015-06-22 DIAGNOSIS — Z961 Presence of intraocular lens: Secondary | ICD-10-CM | POA: Diagnosis not present

## 2015-06-22 DIAGNOSIS — H04123 Dry eye syndrome of bilateral lacrimal glands: Secondary | ICD-10-CM | POA: Diagnosis not present

## 2015-06-22 DIAGNOSIS — Z9889 Other specified postprocedural states: Secondary | ICD-10-CM | POA: Diagnosis not present

## 2015-06-22 DIAGNOSIS — M3500 Sicca syndrome, unspecified: Secondary | ICD-10-CM | POA: Diagnosis not present

## 2015-06-22 DIAGNOSIS — Z8669 Personal history of other diseases of the nervous system and sense organs: Secondary | ICD-10-CM | POA: Diagnosis not present

## 2015-07-07 ENCOUNTER — Ambulatory Visit (INDEPENDENT_AMBULATORY_CARE_PROVIDER_SITE_OTHER): Payer: Medicare Other | Admitting: *Deleted

## 2015-07-07 DIAGNOSIS — E538 Deficiency of other specified B group vitamins: Secondary | ICD-10-CM | POA: Diagnosis not present

## 2015-07-07 MED ORDER — CYANOCOBALAMIN 1000 MCG/ML IJ SOLN
1000.0000 ug | Freq: Once | INTRAMUSCULAR | Status: AC
  Administered 2015-07-07: 1000 ug via INTRAMUSCULAR

## 2015-07-07 NOTE — Progress Notes (Signed)
Patient in for B12 injection. 

## 2015-07-17 ENCOUNTER — Other Ambulatory Visit: Payer: Self-pay

## 2015-07-17 MED ORDER — APIXABAN 5 MG PO TABS: 5.0000 mg | ORAL_TABLET | Freq: Two times a day (BID) | ORAL | Status: DC

## 2015-07-21 ENCOUNTER — Ambulatory Visit: Payer: Medicare Other | Admitting: Neurology

## 2015-07-21 ENCOUNTER — Telehealth: Payer: Self-pay | Admitting: *Deleted

## 2015-07-21 NOTE — Telephone Encounter (Signed)
Pt needs medical clearance and the clearance to hold eliquis 3 days prior to injection. Will forward for dr Stanford Breed review

## 2015-07-21 NOTE — Telephone Encounter (Signed)
Hold 2 days prior to procedure and resume day after if ok with proceduralist. Kirk Ruths

## 2015-07-21 NOTE — Telephone Encounter (Signed)
Will forward this note to the number provided. 

## 2015-08-02 DIAGNOSIS — M5136 Other intervertebral disc degeneration, lumbar region: Secondary | ICD-10-CM | POA: Diagnosis not present

## 2015-08-16 ENCOUNTER — Encounter: Payer: Self-pay | Admitting: Neurology

## 2015-08-16 ENCOUNTER — Ambulatory Visit (INDEPENDENT_AMBULATORY_CARE_PROVIDER_SITE_OTHER): Payer: Medicare Other | Admitting: Neurology

## 2015-08-16 VITALS — BP 104/74 | HR 69 | Wt 187.5 lb

## 2015-08-16 DIAGNOSIS — E538 Deficiency of other specified B group vitamins: Secondary | ICD-10-CM | POA: Diagnosis not present

## 2015-08-16 DIAGNOSIS — M79642 Pain in left hand: Secondary | ICD-10-CM

## 2015-08-16 DIAGNOSIS — R6889 Other general symptoms and signs: Secondary | ICD-10-CM | POA: Diagnosis not present

## 2015-08-16 DIAGNOSIS — M79641 Pain in right hand: Secondary | ICD-10-CM | POA: Diagnosis not present

## 2015-08-16 DIAGNOSIS — M3509 Sicca syndrome with other organ involvement: Secondary | ICD-10-CM

## 2015-08-16 DIAGNOSIS — IMO0001 Reserved for inherently not codable concepts without codable children: Secondary | ICD-10-CM

## 2015-08-16 DIAGNOSIS — G63 Polyneuropathy in diseases classified elsewhere: Secondary | ICD-10-CM

## 2015-08-16 DIAGNOSIS — M3506 Sjogren syndrome with peripheral nervous system involvement: Secondary | ICD-10-CM

## 2015-08-16 DIAGNOSIS — G43109 Migraine with aura, not intractable, without status migrainosus: Secondary | ICD-10-CM

## 2015-08-16 MED ORDER — CYANOCOBALAMIN 1000 MCG/ML IJ SOLN
1000.0000 ug | Freq: Once | INTRAMUSCULAR | Status: AC
  Administered 2015-08-16: 1000 ug via INTRAMUSCULAR

## 2015-08-16 NOTE — Progress Notes (Signed)
Follow-up Visit   Date: 08/16/2015    Krystal Clarke MRN: [id number] DOB: 1935/07/01   [name] is a delightful 80 y.o. right-handed Caucasian female with hypertension, Sjogren's syndrome (diagnosed 0000000) complicated by neuropathy and vision impairment, migraines, atrial fibrillation (on apixiban), history of shingles affecting left T6 (07/2009) and stroke (September 2014, residual word-finding difficulty) returning to the clinic with complaints of abnormal shakes and paresthesias.    History of present illness: She was under the care of Dr. Erling Cruz at [facility] for a over 30 years for migraines, stroke, peripheral neuropathy, headaches, and gait instability. She has a long history of migraines or started at the age of 67. Her headaches were well controlled on Depakote 250 twice a day. However, it was stopped due to low platelets and anticoagulation. She complains of right-sided throbbing and pulsating pain. She has nausea, photophobia, and phonophobia.  Previously tried medication for headaches is depakote, Botox (2 trials without benefit) and maxalt. Being on apixiban, she is unable to take NSAIDs due to bleeding risk.  She was given a prednisone taper dose with dramatic improvement of headaches and has been relatively well since then.   She was admitted to the hospital in September 2014 for community-acquired pneumonia and found to have new onset atrial fibrillation. Due to worsening headaches, MRI of the brain was obtained in October which showed a small left thalamic subacute stroke.   - UPDATE 06/17/2014:  She reports having several falls since the beginning of the year when she was not using a walker. She had an interval fall in bedroom when trying to sit on the chair and missed it. She was unable to stand so called fire department helped her back to her chair, but she did not need to go to the hospital.  Falls seem to mostly occur at home when using her cane, no falls  when using her walker. She no longer has any migraines since increasing Lyrica.   - UPDATE 01/16/2015:  She feels that her headaches are better, but still low grade pain which responds to exedrin as needed.  She has noticed worsening of numbness of the feet up to the knees and also some of the hands.  No interval falls, but she is very cautious with walking. She feels that her legs are weaker and she had not been able to her home exercises by herself.  She also missed two vitamin B12 injections due to not getting a ride to the office.   - UPDATE 05/10/2015:  She presents today with two new issues (1) Around early January, she started having new complaints of intense sneezing for 5-7 times, followed by 5-minutes of bilateral arms shakes.  There is no loss of consciousness, weakness, numbness/tingling, or confusion. It usually occurs when she is sitting in her car. She is able to communicate during these spells. (2) She also has another sensation of both hands feeling cold and developing painful tingling, only occuring when sitting on the commode to void or defecate, which has been ongoing since earlier this year.  There is no weakness. This sensation is worse when she is straining.  These symptoms do not occur when sitting, sneezing, coughing, or laughing.  - UPDATE 08/16/2015:  She continues to have sneezing spells and shaking of the arms, but her EEG is normal.  She did not have NCS/EMG because of GI illness and fistula.  She continues to have right hand numbness, and oes not wish to have  NCS/EMG until her other medical issues are better.  Neuropathy is well controlled on Lyrica 150mg -300mg .  Headaches continue to be mild and infrequent.  She is mostly bothered by his GI symptoms and is struggling to decide whether she would like to undergo surgery for this or not.  Medications:  Current Outpatient Prescriptions on File Prior to Visit  Medication Sig Dispense Refill  . apixaban (ELIQUIS) 5 MG TABS tablet  Take 1 tablet (5 mg total) by mouth 2 (two) times daily. 180 tablet 1  . clobetasol ointment (TEMOVATE) 0.05 %     . cyanocobalamin (,VITAMIN B-12,) 1000 MCG/ML injection Inject 1 mL (1,000 mcg total) into the muscle once. 1 mL 0  . cyclobenzaprine (FLEXERIL) 5 MG tablet TAKE 1 TABLET BY MOUTH AT BEDTIME 90 tablet 3  . diltiazem (CARDIZEM CD) 180 MG 24 hr capsule Take 1 capsule (180 mg total) by mouth daily. 90 capsule 3  . dorzolamide-timolol (COSOPT) 22.3-6.8 MG/ML ophthalmic solution Place 1 drop into both eyes 2 (two) times daily.     Marland Kitchen doxycycline (VIBRAMYCIN) 50 MG capsule Take 1 capsule by mouth daily.  11  . fenofibrate (TRICOR) 145 MG tablet Take 1 tablet by mouth daily.  0  . FLUZONE HIGH-DOSE 0.5 ML SUSY   0  . furosemide (LASIX) 20 MG tablet One tablet daily as needed for swelling 90 tablet 3  . LORazepam (ATIVAN) 1 MG tablet Take 1 mg by mouth at bedtime as needed.   1  . LYRICA 150 MG capsule TAKE ONE CAPSULE BY MOUTH EVERY MORNING & 2 AT BEDTIME 270 capsule 0  . nystatin (MYCOSTATIN) 100000 UNIT/ML suspension Take 100,000 Units by mouth daily.  3  . omeprazole (PRILOSEC) 40 MG capsule Take 40 mg by mouth daily.  0  . ondansetron (ZOFRAN) 4 MG tablet TAKE 1 TABLET (4 MG TOTAL) BY MOUTH EVERY 8 (EIGHT) HOURS AS NEEDED FOR NAUSEA OR VOMITING. 90 tablet 0  . ondansetron (ZOFRAN-ODT) 4 MG disintegrating tablet Take 1 tablet by mouth as needed.  1  . pilocarpine (SALAGEN) 5 MG tablet Take 5 mg by mouth 3 (three) times daily.    . sucralfate (CARAFATE) 1 G tablet Take 1 g by mouth 4 (four) times daily.     . traMADol (ULTRAM) 50 MG tablet     . Vitamin D, Ergocalciferol, (DRISDOL) 50000 UNITS CAPS Take 50,000 Units by mouth every 7 (seven) days. Does not remember what day of the week she takes     No current facility-administered medications on file prior to visit.    Allergies:  Allergies  Allergen Reactions  . Rivaroxaban Other (See Comments)    Extreme back ache and headach  .  Other Nausea Only  . Oxycodone-Acetaminophen Nausea Only  . Codeine Nausea Only  . Hydrocodone Nausea And Vomiting  . Morphine And Related Nausea Only  . Oxycodone-Aspirin     REACTION: DEATHLY SICK-VOMITTING  . Oxycodone-Aspirin Other (See Comments)  . Pilocarpine Other (See Comments)     Review of Systems:  CONSTITUTIONAL: No fevers, chills, night sweats, or weight loss.   EYES: +visual changes or eye pain ENT: No hearing changes.  No history of nose bleeds.   RESPIRATORY: No cough, wheezing and shortness of breath.   CARDIOVASCULAR: Negative for chest pain, and palpitations.   GI: Negative for abdominal discomfort, blood in stools or black stools.  No recent change in bowel habits.   GU:  No history of incontinence.  +hematuria MUSCLOSKELETAL: No history  of joint pain or swelling.  No myalgias.   SKIN: +for lesions, rash, and itching.   ENDOCRINE: Negative for cold or heat intolerance, polydipsia or goiter.   PSYCH:  No depression or anxiety symptoms.   NEURO: As Above.   Vital Signs:  BP 104/74 mmHg  Pulse 69  Wt 187 lb 8 oz (85.049 kg)  SpO2 97%  Neurological Exam: MENTAL STATUS including orientation to time, place, person, recent and remote memory, attention span and concentration, language, and fund of knowledge is fair.   Speech is not dysarthric.  CRANIAL NERVES:  Severe visual impairment bilaterally, she is only able to see colors. Irregular pupils, but reactive to light.  Mild bilateral ptosis. Face is symmetric. Dry oral mucosa and tongue.   MOTOR: Motor strength is 5-/5 throughout.  Tone is normal.   SENSATION:  Pin prick, temperature, and vibration intact in the upper extremities and absent distal to knees bilaterally.    COORDINATION/GAIT: Gait is moderately wide-based with small, short steps, stooped posture, unassisted.   Data: Labs 12/17/2012: Na 134, K 4.4, Chloride 99, Ca 10.4, WBC 7.9, RBC 4.3, Hbg 12.2, Hct 36.9  MRI brain wo contrast 12/23/2012: Very  small acute/ subacute left thalamic nonhemorrhagic infarct. Prominent small vessel disease type changes. No intracranial hemorrhage. Mild atrophy without hydrocephalus.  Prior clinic notes from Dr. Rexene Alberts, previous neurological workup includes:  EEG 06/03/1995, 08/31/2008, 05/25/2015: normal  CSF 1991: normal  Lab Results  Component Value Date   VITAMINB12 127* 04/15/2013   Lab Results  Component Value Date   TSH 0.834 12/06/2012     IMPRESSION/PLAN:   1. Spells of sneezing with associated abnormal hand movements  - These are not suggeestive seizures based on history.  EEG and US carotids nondiagnostic  - Given that symptoms only occur in her car, I question the possibility of some type of hypersensitivity/allergy  - Ambulatory EEG declined  2.  Abnormal painful paresthesias of the hands always associated with voiding/defecating.  Unusual presentation  - She may certainly have neuropathy of hands, but it would be unusual to worsen in this particular setting.    - NCS/EMG of the upper extremities deferred until she is feeling better  - MRI cervical spine can be considered going forward to look for structural lesion such as syrinx  3. Migraine without aura, chronic daily headaches - stable  - Previously used:  VPA (stopped due to low PLT), triptans (stroke), flexeril (no improvement), botox  - She had significant benefit with trial of medrol dose pak  - Continue Lyrica 150mg  in the morning and 300mg  at bedtime which she also takes for neuropathy  4. Vitamin B12 deficiency, Continue B12 injections (monthly), administered today   - Check level vitamin B12 at next visit  5.  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    6. Multifactorial gait instability with generalized weakness,  uses 4-wheeled rollator.   7. Sjogren's disease complicated by severe bilateral vision loss   8. Left thalamic stroke, likely cardioembolic due to afib on apixiban, followed by Dr. Stanford Breed  9. Age-related memory loss possibly transitioning into vascular dementia  - Neuropsychological testing 05/25/2010: Decreased verbal fluency, otherwise normal exam.  Return to clinic in 41-months or sooner  The duration of this appointment visit was 25 minutes of face-to-face time with the  patient.  Greater than 50% of this time was spent in counseling, explanation of diagnosis, planning of further management, and coordination of care.   Thank you for allowing me to participate in patient's care.  If I can answer any additional questions, I would be pleased to do so.    Sincerely,    Donika K. Posey Pronto, DO

## 2015-08-16 NOTE — Patient Instructions (Addendum)
Check vitamin B12 level before next injection If your hand symptoms worsen, please call my office to schedule EMG Return to clinic in 4 months

## 2015-08-21 DIAGNOSIS — H401133 Primary open-angle glaucoma, bilateral, severe stage: Secondary | ICD-10-CM | POA: Diagnosis not present

## 2015-08-23 DIAGNOSIS — H18893 Other specified disorders of cornea, bilateral: Secondary | ICD-10-CM | POA: Diagnosis not present

## 2015-08-23 DIAGNOSIS — M3501 Sicca syndrome with keratoconjunctivitis: Secondary | ICD-10-CM | POA: Diagnosis not present

## 2015-08-23 DIAGNOSIS — M3500 Sicca syndrome, unspecified: Secondary | ICD-10-CM | POA: Diagnosis not present

## 2015-08-23 DIAGNOSIS — Z961 Presence of intraocular lens: Secondary | ICD-10-CM | POA: Diagnosis not present

## 2015-08-27 ENCOUNTER — Other Ambulatory Visit: Payer: Self-pay | Admitting: Neurology

## 2015-08-28 ENCOUNTER — Other Ambulatory Visit: Payer: Self-pay | Admitting: *Deleted

## 2015-08-28 MED ORDER — CYCLOBENZAPRINE HCL 5 MG PO TABS
5.0000 mg | ORAL_TABLET | Freq: Every day | ORAL | Status: DC
Start: 2015-08-28 — End: 2015-11-08

## 2015-08-28 NOTE — Telephone Encounter (Signed)
Rx sent 

## 2015-08-30 ENCOUNTER — Other Ambulatory Visit: Payer: Self-pay | Admitting: *Deleted

## 2015-08-30 ENCOUNTER — Other Ambulatory Visit (INDEPENDENT_AMBULATORY_CARE_PROVIDER_SITE_OTHER): Payer: Medicare Other

## 2015-08-30 DIAGNOSIS — E538 Deficiency of other specified B group vitamins: Secondary | ICD-10-CM | POA: Diagnosis not present

## 2015-08-30 LAB — VITAMIN B12: Vitamin B-12: 485 pg/mL (ref 211–911)

## 2015-09-04 ENCOUNTER — Telehealth: Payer: Self-pay | Admitting: Neurology

## 2015-09-04 NOTE — Telephone Encounter (Signed)
Patient husband Denyse Amass needs to know how much of the vitamin b she is suppose to be taking by pill please call [phone]

## 2015-09-04 NOTE — Telephone Encounter (Signed)
Vitamin B12 levels are improved, okay for her to start vitamin B12 1096mcg by mouth and stop injections. We can recheck levels at her next visit.

## 2015-09-04 NOTE — Telephone Encounter (Signed)
Attempted to reach husband. Line busy. Will call back.

## 2015-09-04 NOTE — Telephone Encounter (Signed)
Spoke with Mr.Pousson, verbalized understanding and denied questions.

## 2015-09-04 NOTE — Telephone Encounter (Signed)
Pt husband needs to know how much of the vi

## 2015-09-12 ENCOUNTER — Ambulatory Visit: Payer: Medicare Other

## 2015-09-12 DIAGNOSIS — G4700 Insomnia, unspecified: Secondary | ICD-10-CM | POA: Diagnosis not present

## 2015-09-12 DIAGNOSIS — N321 Vesicointestinal fistula: Secondary | ICD-10-CM | POA: Diagnosis not present

## 2015-09-12 DIAGNOSIS — M2550 Pain in unspecified joint: Secondary | ICD-10-CM | POA: Diagnosis not present

## 2015-09-12 DIAGNOSIS — G629 Polyneuropathy, unspecified: Secondary | ICD-10-CM | POA: Diagnosis not present

## 2015-09-12 DIAGNOSIS — R5381 Other malaise: Secondary | ICD-10-CM | POA: Diagnosis not present

## 2015-09-12 DIAGNOSIS — R7309 Other abnormal glucose: Secondary | ICD-10-CM | POA: Diagnosis not present

## 2015-09-14 DIAGNOSIS — M545 Low back pain: Secondary | ICD-10-CM | POA: Diagnosis not present

## 2015-09-14 DIAGNOSIS — N321 Vesicointestinal fistula: Secondary | ICD-10-CM | POA: Diagnosis not present

## 2015-09-14 DIAGNOSIS — G629 Polyneuropathy, unspecified: Secondary | ICD-10-CM | POA: Diagnosis not present

## 2015-09-14 DIAGNOSIS — K219 Gastro-esophageal reflux disease without esophagitis: Secondary | ICD-10-CM | POA: Diagnosis not present

## 2015-09-14 DIAGNOSIS — E785 Hyperlipidemia, unspecified: Secondary | ICD-10-CM | POA: Diagnosis not present

## 2015-09-14 DIAGNOSIS — M810 Age-related osteoporosis without current pathological fracture: Secondary | ICD-10-CM | POA: Diagnosis not present

## 2015-09-14 DIAGNOSIS — M170 Bilateral primary osteoarthritis of knee: Secondary | ICD-10-CM | POA: Diagnosis not present

## 2015-09-14 DIAGNOSIS — H548 Legal blindness, as defined in USA: Secondary | ICD-10-CM | POA: Diagnosis not present

## 2015-09-14 DIAGNOSIS — R7303 Prediabetes: Secondary | ICD-10-CM | POA: Diagnosis not present

## 2015-09-19 DIAGNOSIS — M170 Bilateral primary osteoarthritis of knee: Secondary | ICD-10-CM | POA: Diagnosis not present

## 2015-09-19 DIAGNOSIS — H548 Legal blindness, as defined in USA: Secondary | ICD-10-CM | POA: Diagnosis not present

## 2015-09-19 DIAGNOSIS — M810 Age-related osteoporosis without current pathological fracture: Secondary | ICD-10-CM | POA: Diagnosis not present

## 2015-09-19 DIAGNOSIS — M545 Low back pain: Secondary | ICD-10-CM | POA: Diagnosis not present

## 2015-09-19 DIAGNOSIS — N321 Vesicointestinal fistula: Secondary | ICD-10-CM | POA: Diagnosis not present

## 2015-09-19 DIAGNOSIS — G629 Polyneuropathy, unspecified: Secondary | ICD-10-CM | POA: Diagnosis not present

## 2015-09-22 DIAGNOSIS — G629 Polyneuropathy, unspecified: Secondary | ICD-10-CM | POA: Diagnosis not present

## 2015-09-22 DIAGNOSIS — H548 Legal blindness, as defined in USA: Secondary | ICD-10-CM | POA: Diagnosis not present

## 2015-09-22 DIAGNOSIS — N321 Vesicointestinal fistula: Secondary | ICD-10-CM | POA: Diagnosis not present

## 2015-09-22 DIAGNOSIS — M810 Age-related osteoporosis without current pathological fracture: Secondary | ICD-10-CM | POA: Diagnosis not present

## 2015-09-22 DIAGNOSIS — M545 Low back pain: Secondary | ICD-10-CM | POA: Diagnosis not present

## 2015-09-22 DIAGNOSIS — M170 Bilateral primary osteoarthritis of knee: Secondary | ICD-10-CM | POA: Diagnosis not present

## 2015-09-26 DIAGNOSIS — G629 Polyneuropathy, unspecified: Secondary | ICD-10-CM | POA: Diagnosis not present

## 2015-09-26 DIAGNOSIS — N321 Vesicointestinal fistula: Secondary | ICD-10-CM | POA: Diagnosis not present

## 2015-09-26 DIAGNOSIS — H548 Legal blindness, as defined in USA: Secondary | ICD-10-CM | POA: Diagnosis not present

## 2015-09-26 DIAGNOSIS — M170 Bilateral primary osteoarthritis of knee: Secondary | ICD-10-CM | POA: Diagnosis not present

## 2015-09-26 DIAGNOSIS — M545 Low back pain: Secondary | ICD-10-CM | POA: Diagnosis not present

## 2015-09-26 DIAGNOSIS — M810 Age-related osteoporosis without current pathological fracture: Secondary | ICD-10-CM | POA: Diagnosis not present

## 2015-09-28 DIAGNOSIS — N321 Vesicointestinal fistula: Secondary | ICD-10-CM | POA: Diagnosis not present

## 2015-09-28 DIAGNOSIS — M170 Bilateral primary osteoarthritis of knee: Secondary | ICD-10-CM | POA: Diagnosis not present

## 2015-09-28 DIAGNOSIS — H548 Legal blindness, as defined in USA: Secondary | ICD-10-CM | POA: Diagnosis not present

## 2015-09-28 DIAGNOSIS — M810 Age-related osteoporosis without current pathological fracture: Secondary | ICD-10-CM | POA: Diagnosis not present

## 2015-09-28 DIAGNOSIS — M545 Low back pain: Secondary | ICD-10-CM | POA: Diagnosis not present

## 2015-09-28 DIAGNOSIS — G629 Polyneuropathy, unspecified: Secondary | ICD-10-CM | POA: Diagnosis not present

## 2015-10-03 DIAGNOSIS — G629 Polyneuropathy, unspecified: Secondary | ICD-10-CM | POA: Diagnosis not present

## 2015-10-03 DIAGNOSIS — N321 Vesicointestinal fistula: Secondary | ICD-10-CM | POA: Diagnosis not present

## 2015-10-03 DIAGNOSIS — M545 Low back pain: Secondary | ICD-10-CM | POA: Diagnosis not present

## 2015-10-03 DIAGNOSIS — M810 Age-related osteoporosis without current pathological fracture: Secondary | ICD-10-CM | POA: Diagnosis not present

## 2015-10-03 DIAGNOSIS — H548 Legal blindness, as defined in USA: Secondary | ICD-10-CM | POA: Diagnosis not present

## 2015-10-03 DIAGNOSIS — M170 Bilateral primary osteoarthritis of knee: Secondary | ICD-10-CM | POA: Diagnosis not present

## 2015-10-04 DIAGNOSIS — Z8673 Personal history of transient ischemic attack (TIA), and cerebral infarction without residual deficits: Secondary | ICD-10-CM | POA: Diagnosis not present

## 2015-10-04 DIAGNOSIS — N7689 Other specified inflammation of vagina and vulva: Secondary | ICD-10-CM | POA: Diagnosis not present

## 2015-10-04 DIAGNOSIS — L292 Pruritus vulvae: Secondary | ICD-10-CM | POA: Diagnosis not present

## 2015-10-04 DIAGNOSIS — N321 Vesicointestinal fistula: Secondary | ICD-10-CM | POA: Diagnosis not present

## 2015-10-06 DIAGNOSIS — N321 Vesicointestinal fistula: Secondary | ICD-10-CM | POA: Diagnosis not present

## 2015-10-06 DIAGNOSIS — M545 Low back pain: Secondary | ICD-10-CM | POA: Diagnosis not present

## 2015-10-06 DIAGNOSIS — M810 Age-related osteoporosis without current pathological fracture: Secondary | ICD-10-CM | POA: Diagnosis not present

## 2015-10-06 DIAGNOSIS — G629 Polyneuropathy, unspecified: Secondary | ICD-10-CM | POA: Diagnosis not present

## 2015-10-06 DIAGNOSIS — M170 Bilateral primary osteoarthritis of knee: Secondary | ICD-10-CM | POA: Diagnosis not present

## 2015-10-06 DIAGNOSIS — H548 Legal blindness, as defined in USA: Secondary | ICD-10-CM | POA: Diagnosis not present

## 2015-10-10 DIAGNOSIS — H548 Legal blindness, as defined in USA: Secondary | ICD-10-CM | POA: Diagnosis not present

## 2015-10-10 DIAGNOSIS — M545 Low back pain: Secondary | ICD-10-CM | POA: Diagnosis not present

## 2015-10-10 DIAGNOSIS — M810 Age-related osteoporosis without current pathological fracture: Secondary | ICD-10-CM | POA: Diagnosis not present

## 2015-10-10 DIAGNOSIS — G629 Polyneuropathy, unspecified: Secondary | ICD-10-CM | POA: Diagnosis not present

## 2015-10-10 DIAGNOSIS — M170 Bilateral primary osteoarthritis of knee: Secondary | ICD-10-CM | POA: Diagnosis not present

## 2015-10-10 DIAGNOSIS — N321 Vesicointestinal fistula: Secondary | ICD-10-CM | POA: Diagnosis not present

## 2015-10-11 DIAGNOSIS — M75102 Unspecified rotator cuff tear or rupture of left shoulder, not specified as traumatic: Secondary | ICD-10-CM | POA: Diagnosis not present

## 2015-10-11 DIAGNOSIS — M1711 Unilateral primary osteoarthritis, right knee: Secondary | ICD-10-CM | POA: Diagnosis not present

## 2015-10-12 DIAGNOSIS — G629 Polyneuropathy, unspecified: Secondary | ICD-10-CM | POA: Diagnosis not present

## 2015-10-12 DIAGNOSIS — H548 Legal blindness, as defined in USA: Secondary | ICD-10-CM | POA: Diagnosis not present

## 2015-10-12 DIAGNOSIS — N321 Vesicointestinal fistula: Secondary | ICD-10-CM | POA: Diagnosis not present

## 2015-10-12 DIAGNOSIS — M170 Bilateral primary osteoarthritis of knee: Secondary | ICD-10-CM | POA: Diagnosis not present

## 2015-10-12 DIAGNOSIS — M545 Low back pain: Secondary | ICD-10-CM | POA: Diagnosis not present

## 2015-10-12 DIAGNOSIS — M810 Age-related osteoporosis without current pathological fracture: Secondary | ICD-10-CM | POA: Diagnosis not present

## 2015-10-17 DIAGNOSIS — M545 Low back pain: Secondary | ICD-10-CM | POA: Diagnosis not present

## 2015-10-17 DIAGNOSIS — G629 Polyneuropathy, unspecified: Secondary | ICD-10-CM | POA: Diagnosis not present

## 2015-10-17 DIAGNOSIS — H548 Legal blindness, as defined in USA: Secondary | ICD-10-CM | POA: Diagnosis not present

## 2015-10-17 DIAGNOSIS — M170 Bilateral primary osteoarthritis of knee: Secondary | ICD-10-CM | POA: Diagnosis not present

## 2015-10-17 DIAGNOSIS — N321 Vesicointestinal fistula: Secondary | ICD-10-CM | POA: Diagnosis not present

## 2015-10-17 DIAGNOSIS — M810 Age-related osteoporosis without current pathological fracture: Secondary | ICD-10-CM | POA: Diagnosis not present

## 2015-10-19 DIAGNOSIS — M810 Age-related osteoporosis without current pathological fracture: Secondary | ICD-10-CM | POA: Diagnosis not present

## 2015-10-19 DIAGNOSIS — G629 Polyneuropathy, unspecified: Secondary | ICD-10-CM | POA: Diagnosis not present

## 2015-10-19 DIAGNOSIS — M170 Bilateral primary osteoarthritis of knee: Secondary | ICD-10-CM | POA: Diagnosis not present

## 2015-10-19 DIAGNOSIS — M545 Low back pain: Secondary | ICD-10-CM | POA: Diagnosis not present

## 2015-10-19 DIAGNOSIS — H548 Legal blindness, as defined in USA: Secondary | ICD-10-CM | POA: Diagnosis not present

## 2015-10-19 DIAGNOSIS — N321 Vesicointestinal fistula: Secondary | ICD-10-CM | POA: Diagnosis not present

## 2015-10-20 ENCOUNTER — Other Ambulatory Visit: Payer: Self-pay | Admitting: Neurology

## 2015-10-23 ENCOUNTER — Other Ambulatory Visit: Payer: Self-pay | Admitting: *Deleted

## 2015-10-23 MED ORDER — PREGABALIN 150 MG PO CAPS: ORAL_CAPSULE | ORAL | 0 refills | Status: DC

## 2015-10-23 NOTE — Telephone Encounter (Signed)
Rx sent 

## 2015-10-24 DIAGNOSIS — M545 Low back pain: Secondary | ICD-10-CM | POA: Diagnosis not present

## 2015-10-24 DIAGNOSIS — M810 Age-related osteoporosis without current pathological fracture: Secondary | ICD-10-CM | POA: Diagnosis not present

## 2015-10-24 DIAGNOSIS — M170 Bilateral primary osteoarthritis of knee: Secondary | ICD-10-CM | POA: Diagnosis not present

## 2015-10-24 DIAGNOSIS — H548 Legal blindness, as defined in USA: Secondary | ICD-10-CM | POA: Diagnosis not present

## 2015-10-24 DIAGNOSIS — N321 Vesicointestinal fistula: Secondary | ICD-10-CM | POA: Diagnosis not present

## 2015-10-24 DIAGNOSIS — G629 Polyneuropathy, unspecified: Secondary | ICD-10-CM | POA: Diagnosis not present

## 2015-10-31 DIAGNOSIS — M810 Age-related osteoporosis without current pathological fracture: Secondary | ICD-10-CM | POA: Diagnosis not present

## 2015-10-31 DIAGNOSIS — G629 Polyneuropathy, unspecified: Secondary | ICD-10-CM | POA: Diagnosis not present

## 2015-10-31 DIAGNOSIS — N321 Vesicointestinal fistula: Secondary | ICD-10-CM | POA: Diagnosis not present

## 2015-10-31 DIAGNOSIS — H548 Legal blindness, as defined in USA: Secondary | ICD-10-CM | POA: Diagnosis not present

## 2015-10-31 DIAGNOSIS — M545 Low back pain: Secondary | ICD-10-CM | POA: Diagnosis not present

## 2015-10-31 DIAGNOSIS — M170 Bilateral primary osteoarthritis of knee: Secondary | ICD-10-CM | POA: Diagnosis not present

## 2015-11-02 DIAGNOSIS — M545 Low back pain: Secondary | ICD-10-CM | POA: Diagnosis not present

## 2015-11-02 DIAGNOSIS — G629 Polyneuropathy, unspecified: Secondary | ICD-10-CM | POA: Diagnosis not present

## 2015-11-02 DIAGNOSIS — N321 Vesicointestinal fistula: Secondary | ICD-10-CM | POA: Diagnosis not present

## 2015-11-02 DIAGNOSIS — H548 Legal blindness, as defined in USA: Secondary | ICD-10-CM | POA: Diagnosis not present

## 2015-11-02 DIAGNOSIS — M170 Bilateral primary osteoarthritis of knee: Secondary | ICD-10-CM | POA: Diagnosis not present

## 2015-11-02 DIAGNOSIS — M810 Age-related osteoporosis without current pathological fracture: Secondary | ICD-10-CM | POA: Diagnosis not present

## 2015-11-07 ENCOUNTER — Other Ambulatory Visit: Payer: Self-pay | Admitting: Neurology

## 2015-11-07 DIAGNOSIS — N321 Vesicointestinal fistula: Secondary | ICD-10-CM | POA: Diagnosis not present

## 2015-11-07 DIAGNOSIS — H548 Legal blindness, as defined in USA: Secondary | ICD-10-CM | POA: Diagnosis not present

## 2015-11-07 DIAGNOSIS — M810 Age-related osteoporosis without current pathological fracture: Secondary | ICD-10-CM | POA: Diagnosis not present

## 2015-11-07 DIAGNOSIS — G629 Polyneuropathy, unspecified: Secondary | ICD-10-CM | POA: Diagnosis not present

## 2015-11-07 DIAGNOSIS — M545 Low back pain: Secondary | ICD-10-CM | POA: Diagnosis not present

## 2015-11-07 DIAGNOSIS — M170 Bilateral primary osteoarthritis of knee: Secondary | ICD-10-CM | POA: Diagnosis not present

## 2015-11-08 ENCOUNTER — Other Ambulatory Visit: Payer: Self-pay | Admitting: *Deleted

## 2015-11-08 MED ORDER — CYCLOBENZAPRINE HCL 5 MG PO TABS: 5.0000 mg | ORAL_TABLET | Freq: Every day | ORAL | 3 refills | Status: DC

## 2015-11-08 NOTE — Telephone Encounter (Signed)
Rx sent 

## 2015-11-09 DIAGNOSIS — G629 Polyneuropathy, unspecified: Secondary | ICD-10-CM | POA: Diagnosis not present

## 2015-11-09 DIAGNOSIS — M545 Low back pain: Secondary | ICD-10-CM | POA: Diagnosis not present

## 2015-11-09 DIAGNOSIS — N321 Vesicointestinal fistula: Secondary | ICD-10-CM | POA: Diagnosis not present

## 2015-11-09 DIAGNOSIS — H548 Legal blindness, as defined in USA: Secondary | ICD-10-CM | POA: Diagnosis not present

## 2015-11-09 DIAGNOSIS — M170 Bilateral primary osteoarthritis of knee: Secondary | ICD-10-CM | POA: Diagnosis not present

## 2015-11-09 DIAGNOSIS — M810 Age-related osteoporosis without current pathological fracture: Secondary | ICD-10-CM | POA: Diagnosis not present

## 2015-11-13 DIAGNOSIS — M170 Bilateral primary osteoarthritis of knee: Secondary | ICD-10-CM | POA: Diagnosis not present

## 2015-11-13 DIAGNOSIS — M545 Low back pain: Secondary | ICD-10-CM | POA: Diagnosis not present

## 2015-11-13 DIAGNOSIS — H548 Legal blindness, as defined in USA: Secondary | ICD-10-CM | POA: Diagnosis not present

## 2015-11-13 DIAGNOSIS — N321 Vesicointestinal fistula: Secondary | ICD-10-CM | POA: Diagnosis not present

## 2015-11-13 DIAGNOSIS — K219 Gastro-esophageal reflux disease without esophagitis: Secondary | ICD-10-CM | POA: Diagnosis not present

## 2015-11-13 DIAGNOSIS — M810 Age-related osteoporosis without current pathological fracture: Secondary | ICD-10-CM | POA: Diagnosis not present

## 2015-11-13 DIAGNOSIS — R7303 Prediabetes: Secondary | ICD-10-CM | POA: Diagnosis not present

## 2015-11-13 DIAGNOSIS — G629 Polyneuropathy, unspecified: Secondary | ICD-10-CM | POA: Diagnosis not present

## 2015-11-13 DIAGNOSIS — E785 Hyperlipidemia, unspecified: Secondary | ICD-10-CM | POA: Diagnosis not present

## 2015-11-14 DIAGNOSIS — N321 Vesicointestinal fistula: Secondary | ICD-10-CM | POA: Diagnosis not present

## 2015-11-14 DIAGNOSIS — G629 Polyneuropathy, unspecified: Secondary | ICD-10-CM | POA: Diagnosis not present

## 2015-11-14 DIAGNOSIS — M170 Bilateral primary osteoarthritis of knee: Secondary | ICD-10-CM | POA: Diagnosis not present

## 2015-11-14 DIAGNOSIS — H548 Legal blindness, as defined in USA: Secondary | ICD-10-CM | POA: Diagnosis not present

## 2015-11-14 DIAGNOSIS — M545 Low back pain: Secondary | ICD-10-CM | POA: Diagnosis not present

## 2015-11-14 DIAGNOSIS — M810 Age-related osteoporosis without current pathological fracture: Secondary | ICD-10-CM | POA: Diagnosis not present

## 2015-11-16 DIAGNOSIS — L292 Pruritus vulvae: Secondary | ICD-10-CM | POA: Diagnosis not present

## 2015-11-16 DIAGNOSIS — G629 Polyneuropathy, unspecified: Secondary | ICD-10-CM | POA: Diagnosis not present

## 2015-11-16 DIAGNOSIS — H548 Legal blindness, as defined in USA: Secondary | ICD-10-CM | POA: Diagnosis not present

## 2015-11-16 DIAGNOSIS — M545 Low back pain: Secondary | ICD-10-CM | POA: Diagnosis not present

## 2015-11-16 DIAGNOSIS — M810 Age-related osteoporosis without current pathological fracture: Secondary | ICD-10-CM | POA: Diagnosis not present

## 2015-11-16 DIAGNOSIS — Z8673 Personal history of transient ischemic attack (TIA), and cerebral infarction without residual deficits: Secondary | ICD-10-CM | POA: Diagnosis not present

## 2015-11-16 DIAGNOSIS — N321 Vesicointestinal fistula: Secondary | ICD-10-CM | POA: Diagnosis not present

## 2015-11-16 DIAGNOSIS — M170 Bilateral primary osteoarthritis of knee: Secondary | ICD-10-CM | POA: Diagnosis not present

## 2015-11-16 DIAGNOSIS — N7689 Other specified inflammation of vagina and vulva: Secondary | ICD-10-CM | POA: Diagnosis not present

## 2015-11-21 DIAGNOSIS — H548 Legal blindness, as defined in USA: Secondary | ICD-10-CM | POA: Diagnosis not present

## 2015-11-21 DIAGNOSIS — N321 Vesicointestinal fistula: Secondary | ICD-10-CM | POA: Diagnosis not present

## 2015-11-21 DIAGNOSIS — M545 Low back pain: Secondary | ICD-10-CM | POA: Diagnosis not present

## 2015-11-21 DIAGNOSIS — M810 Age-related osteoporosis without current pathological fracture: Secondary | ICD-10-CM | POA: Diagnosis not present

## 2015-11-21 DIAGNOSIS — G629 Polyneuropathy, unspecified: Secondary | ICD-10-CM | POA: Diagnosis not present

## 2015-11-21 DIAGNOSIS — M170 Bilateral primary osteoarthritis of knee: Secondary | ICD-10-CM | POA: Diagnosis not present

## 2015-11-22 DIAGNOSIS — Z23 Encounter for immunization: Secondary | ICD-10-CM | POA: Diagnosis not present

## 2015-11-23 DIAGNOSIS — N321 Vesicointestinal fistula: Secondary | ICD-10-CM | POA: Diagnosis not present

## 2015-11-23 DIAGNOSIS — M170 Bilateral primary osteoarthritis of knee: Secondary | ICD-10-CM | POA: Diagnosis not present

## 2015-11-23 DIAGNOSIS — G629 Polyneuropathy, unspecified: Secondary | ICD-10-CM | POA: Diagnosis not present

## 2015-11-23 DIAGNOSIS — M545 Low back pain: Secondary | ICD-10-CM | POA: Diagnosis not present

## 2015-11-23 DIAGNOSIS — M810 Age-related osteoporosis without current pathological fracture: Secondary | ICD-10-CM | POA: Diagnosis not present

## 2015-11-23 DIAGNOSIS — H548 Legal blindness, as defined in USA: Secondary | ICD-10-CM | POA: Diagnosis not present

## 2015-11-27 ENCOUNTER — Other Ambulatory Visit: Payer: Self-pay | Admitting: General Surgery

## 2015-11-27 DIAGNOSIS — N824 Other female intestinal-genital tract fistulae: Secondary | ICD-10-CM | POA: Diagnosis not present

## 2015-11-28 DIAGNOSIS — G629 Polyneuropathy, unspecified: Secondary | ICD-10-CM | POA: Diagnosis not present

## 2015-11-28 DIAGNOSIS — H548 Legal blindness, as defined in USA: Secondary | ICD-10-CM | POA: Diagnosis not present

## 2015-11-28 DIAGNOSIS — M545 Low back pain: Secondary | ICD-10-CM | POA: Diagnosis not present

## 2015-11-28 DIAGNOSIS — M810 Age-related osteoporosis without current pathological fracture: Secondary | ICD-10-CM | POA: Diagnosis not present

## 2015-11-28 DIAGNOSIS — M170 Bilateral primary osteoarthritis of knee: Secondary | ICD-10-CM | POA: Diagnosis not present

## 2015-11-28 DIAGNOSIS — N321 Vesicointestinal fistula: Secondary | ICD-10-CM | POA: Diagnosis not present

## 2015-11-29 ENCOUNTER — Ambulatory Visit: Payer: Medicare Other | Admitting: Neurology

## 2015-11-30 DIAGNOSIS — N321 Vesicointestinal fistula: Secondary | ICD-10-CM | POA: Diagnosis not present

## 2015-11-30 DIAGNOSIS — H548 Legal blindness, as defined in USA: Secondary | ICD-10-CM | POA: Diagnosis not present

## 2015-11-30 DIAGNOSIS — G629 Polyneuropathy, unspecified: Secondary | ICD-10-CM | POA: Diagnosis not present

## 2015-11-30 DIAGNOSIS — M810 Age-related osteoporosis without current pathological fracture: Secondary | ICD-10-CM | POA: Diagnosis not present

## 2015-11-30 DIAGNOSIS — M545 Low back pain: Secondary | ICD-10-CM | POA: Diagnosis not present

## 2015-11-30 DIAGNOSIS — M170 Bilateral primary osteoarthritis of knee: Secondary | ICD-10-CM | POA: Diagnosis not present

## 2015-12-01 ENCOUNTER — Other Ambulatory Visit: Payer: Self-pay | Admitting: Cardiology

## 2015-12-05 ENCOUNTER — Encounter: Payer: Self-pay | Admitting: Cardiology

## 2015-12-05 DIAGNOSIS — H548 Legal blindness, as defined in USA: Secondary | ICD-10-CM | POA: Diagnosis not present

## 2015-12-05 DIAGNOSIS — M545 Low back pain: Secondary | ICD-10-CM | POA: Diagnosis not present

## 2015-12-05 DIAGNOSIS — N321 Vesicointestinal fistula: Secondary | ICD-10-CM | POA: Diagnosis not present

## 2015-12-05 DIAGNOSIS — M170 Bilateral primary osteoarthritis of knee: Secondary | ICD-10-CM | POA: Diagnosis not present

## 2015-12-05 DIAGNOSIS — M810 Age-related osteoporosis without current pathological fracture: Secondary | ICD-10-CM | POA: Diagnosis not present

## 2015-12-05 DIAGNOSIS — G629 Polyneuropathy, unspecified: Secondary | ICD-10-CM | POA: Diagnosis not present

## 2015-12-07 DIAGNOSIS — N321 Vesicointestinal fistula: Secondary | ICD-10-CM | POA: Diagnosis not present

## 2015-12-07 DIAGNOSIS — M810 Age-related osteoporosis without current pathological fracture: Secondary | ICD-10-CM | POA: Diagnosis not present

## 2015-12-07 DIAGNOSIS — M170 Bilateral primary osteoarthritis of knee: Secondary | ICD-10-CM | POA: Diagnosis not present

## 2015-12-07 DIAGNOSIS — H548 Legal blindness, as defined in USA: Secondary | ICD-10-CM | POA: Diagnosis not present

## 2015-12-07 DIAGNOSIS — G629 Polyneuropathy, unspecified: Secondary | ICD-10-CM | POA: Diagnosis not present

## 2015-12-07 DIAGNOSIS — M545 Low back pain: Secondary | ICD-10-CM | POA: Diagnosis not present

## 2015-12-12 ENCOUNTER — Ambulatory Visit (INDEPENDENT_AMBULATORY_CARE_PROVIDER_SITE_OTHER): Payer: Medicare Other | Admitting: Cardiology

## 2015-12-12 ENCOUNTER — Encounter: Payer: Self-pay | Admitting: Cardiology

## 2015-12-12 VITALS — BP 112/70 | HR 78 | Ht 61.0 in | Wt 185.0 lb

## 2015-12-12 DIAGNOSIS — I10 Essential (primary) hypertension: Secondary | ICD-10-CM

## 2015-12-12 DIAGNOSIS — Z01810 Encounter for preprocedural cardiovascular examination: Secondary | ICD-10-CM | POA: Diagnosis not present

## 2015-12-12 DIAGNOSIS — I4891 Unspecified atrial fibrillation: Secondary | ICD-10-CM

## 2015-12-12 DIAGNOSIS — N321 Vesicointestinal fistula: Secondary | ICD-10-CM | POA: Diagnosis not present

## 2015-12-12 DIAGNOSIS — M170 Bilateral primary osteoarthritis of knee: Secondary | ICD-10-CM | POA: Diagnosis not present

## 2015-12-12 DIAGNOSIS — E785 Hyperlipidemia, unspecified: Secondary | ICD-10-CM

## 2015-12-12 DIAGNOSIS — G629 Polyneuropathy, unspecified: Secondary | ICD-10-CM | POA: Diagnosis not present

## 2015-12-12 DIAGNOSIS — H548 Legal blindness, as defined in USA: Secondary | ICD-10-CM | POA: Diagnosis not present

## 2015-12-12 DIAGNOSIS — M545 Low back pain: Secondary | ICD-10-CM | POA: Diagnosis not present

## 2015-12-12 DIAGNOSIS — M810 Age-related osteoporosis without current pathological fracture: Secondary | ICD-10-CM | POA: Diagnosis not present

## 2015-12-12 NOTE — Progress Notes (Signed)
HPI: FU atrial fibrillation. She has a hx of Sjogren's syndrome, seizure disorder, HTN, HL, peripheral neuropathy. She was admitted 9/14 after presenting with overall functional decline, cough and generalized weakness and malaise. She was found to be in atrial fibrillation with RVR. CHADS2-VASc=4. She was placed on Xarelto for anticoagulation. Echo (12/07/12): Moderate focal basal hypertrophy of the septum, vigorous LV function, EF 65-70%, indeterminate diastolic function, normal wall motion, trivial MR, moderate LAE, trivial TR. She converted to sinus rhythm. MRI in October of 2014 showed a very small acute/subacute left thalamic infarct. Patient found to be in recurrent atrial fibrillation 8/16. Nuclear study 8/16 showed EF 65 and no ischemia. Echo 8/16 showed normal LV function. Patient declined cardioversion previously and her amiodarone was discontinued. Holter monitor February 2017 showed atrial fibrillation with PVCs or aberrantly conducted beats rate controlled. Showed 1-39% bilateral stenosis. Since last seen, she denies dyspnea, chest pain, palpitations, syncope or bleeding. She has an enteric vesicular fistula that will require surgical intervention and we were asked to evaluate preoperatively.  Current Outpatient Prescriptions  Medication Sig Dispense Refill  . apixaban (ELIQUIS) 5 MG TABS tablet Take 1 tablet (5 mg total) by mouth 2 (two) times daily. 180 tablet 1  . clobetasol ointment (TEMOVATE) 0.05 %     . cyanocobalamin (,VITAMIN B-12,) 1000 MCG/ML injection Inject 1 mL (1,000 mcg total) into the muscle once. 1 mL 0  . cyclobenzaprine (FLEXERIL) 5 MG tablet TAKE 1 TABLET BY MOUTH AT BEDTIME 90 tablet 3  . cyclobenzaprine (FLEXERIL) 5 MG tablet Take 1 tablet (5 mg total) by mouth at bedtime. 90 tablet 3  . diltiazem (CARDIZEM CD) 180 MG 24 hr capsule Take 1 capsule (180 mg total) by mouth daily. 90 capsule 3  . dorzolamide-timolol (COSOPT) 22.3-6.8 MG/ML ophthalmic solution Place  1 drop into both eyes 2 (two) times daily.     Marland Kitchen doxycycline (VIBRAMYCIN) 50 MG capsule Take 1 capsule by mouth daily.  11  . fenofibrate (TRICOR) 145 MG tablet Take 1 tablet by mouth daily.  0  . FLUZONE HIGH-DOSE 0.5 ML SUSY   0  . furosemide (LASIX) 20 MG tablet One tablet daily as needed for swelling 90 tablet 3  . LORazepam (ATIVAN) 1 MG tablet Take 1 mg by mouth at bedtime as needed.   1  . nystatin (MYCOSTATIN) 100000 UNIT/ML suspension Take 100,000 Units by mouth daily.  3  . omeprazole (PRILOSEC) 40 MG capsule Take 40 mg by mouth daily.  0  . ondansetron (ZOFRAN) 4 MG tablet TAKE 1 TABLET (4 MG TOTAL) BY MOUTH EVERY 8 (EIGHT) HOURS AS NEEDED FOR NAUSEA OR VOMITING. 90 tablet 0  . ondansetron (ZOFRAN-ODT) 4 MG disintegrating tablet Take 1 tablet by mouth as needed.  1  . pilocarpine (SALAGEN) 5 MG tablet Take 5 mg by mouth 3 (three) times daily.    . pregabalin (LYRICA) 150 MG capsule TAKE ONE CAPSULE BY MOUTH EVERY MORNING & 2 AT BEDTIME 270 capsule 0  . sucralfate (CARAFATE) 1 G tablet Take 1 g by mouth 4 (four) times daily.     . traMADol (ULTRAM) 50 MG tablet     . Vitamin D, Ergocalciferol, (DRISDOL) 50000 UNITS CAPS Take 50,000 Units by mouth every 7 (seven) days. Does not remember what day of the week she takes     No current facility-administered medications for this visit.      Past Medical History:  Diagnosis Date  . Allergy   .  Blindness of right eye   . Gait disorder 06/05/2012  . GERD (gastroesophageal reflux disease)   . Glaucoma   . Glucose intolerance (impaired glucose tolerance)   . History of diverticulitis   . Hypertension   . Legally blind   . Migraine triggered seizures    history of  . Migraines   . Neuropathy ([place])   . OP (osteoporosis)   . Osteoarthritis   . Paroxysmal atrial fibrillation ([place]) 12/06/2012   Echo (12/07/12): Moderate focal basal hypertrophy of the septum, vigorous LV function, EF 65-70%, indeterminate diastolic function, normal wall  motion, trivial MR, moderate LAE, trivial TR;  Xarelto started 11/2012  . Peripheral neuropathy ([place])   . Pneumonia   . Siriasis   . Sjogren's syndrome ([place])    bilateral vision  . Sleep apnea    does not wear CPAP  . Stroke [facility])     Past Surgical History:  Procedure Laterality Date  . BONE TUMOR EXCISION Right    arm  . CATARACT EXTRACTION    . COLONOSCOPY WITH PROPOFOL N/A 06/09/2015   Procedure: COLONOSCOPY WITH PROPOFOL;  Surgeon: Ronald Lobo, MD;  Location: [facility];  Service: Endoscopy;  Laterality: N/A;  . DILATION AND CURETTAGE OF UTERUS    . ESOPHAGOGASTRODUODENOSCOPY (EGD) WITH PROPOFOL N/A 06/09/2015   Procedure: ESOPHAGOGASTRODUODENOSCOPY (EGD) WITH PROPOFOL;  Surgeon: Ronald Lobo, MD;  Location: [facility];  Service: Endoscopy;  Laterality: N/A;  . EYE SURGERY    . FOOT FRACTURE SURGERY Left 2007  . KNEE ARTHROSCOPY Right   . LUMBAR LAMINECTOMY    . SHOULDER OPEN ROTATOR CUFF REPAIR    . TONSILLECTOMY AND ADENOIDECTOMY    . TOTAL ABDOMINAL HYSTERECTOMY W/ BILATERAL SALPINGOOPHORECTOMY      Social History   Social History  . Marital status: Married    Spouse name: jesse  . Number of children: 2  . Years of education: college   Occupational History  . retired    Social History Main Topics  . Smoking status: Former Smoker    Types: Cigarettes  . Smokeless tobacco: Never Used     Comment: quit smoking before age of 22  . Alcohol use No  . Drug use: No  . Sexual activity: Not on file   Other Topics Concern  . Not on file   Social History Narrative   She lives with husband of 38 years.  They have a one story home.    Family History  Problem Relation Age of Onset  . Stroke Mother   . Migraines Mother   . Stomach cancer Father   . Sjogren's syndrome Father   . Sjogren's syndrome Brother   . Sjogren's syndrome Brother   . Scleroderma Sister     raynaud's scleroderma  . Lymphoma Daughter   . Migraines Daughter   . Heart disease       maternal side  . Stroke      maternal side, 1st degree female <60    ROS: no fevers or chills, productive cough, hemoptysis, dysphasia, odynophagia, melena, hematochezia, dysuria, hematuria, rash, seizure activity, orthopnea, PND, pedal edema, claudication. Remaining systems are negative.  Physical Exam: Well-developed obese in no acute distress.  Skin is warm and dry. Ecchymoses HEENT is normal.  Neck is supple.  Chest is clear to auscultation with normal expansion.  Cardiovascular exam is irregular Abdominal exam nontender or distended. No masses palpated. Extremities show trace edema. neuro grossly intact  ECG-atrial fibrillation at a rate of 78. Cannot rule  out prior septal infarct. Nonspecific ST changes.  A/P  1 Atrial fibrillation-patient did not want to pursue cardioversion. She remains asymptomatic. Plan to continue present medications for rate control. Continue apixaban.   2 hypertension-blood pressure controlled. Continue present medications.  3 hyperlipidemia-management per primary care.  4 preoperative evaluation-patient will require repair of enteric vesicular fistula. Given her age and multiple comorbidities her risk will be higher. However she had a nuclear study one year ago that showed no ischemia. Her LV function is preserved. She does not require further cardiac evaluation preoperatively. She will need to discontinue her apixaban 3 days prior to procedure and resume after when hemostasis achieved. She has a history of CVA and will be higher risk for embolic event off of anticoagulation. Patient understands the risks of surgery and wishes to proceed. She may proceed as outlined above.  Kirk Ruths, MD

## 2015-12-12 NOTE — Patient Instructions (Signed)
Your physician recommends that you schedule a follow-up appointment in: 3 MONTHS WITH DR CRENSHAW  

## 2015-12-14 DIAGNOSIS — E559 Vitamin D deficiency, unspecified: Secondary | ICD-10-CM | POA: Diagnosis not present

## 2015-12-14 DIAGNOSIS — Z7984 Long term (current) use of oral hypoglycemic drugs: Secondary | ICD-10-CM | POA: Diagnosis not present

## 2015-12-14 DIAGNOSIS — E785 Hyperlipidemia, unspecified: Secondary | ICD-10-CM | POA: Diagnosis not present

## 2015-12-14 DIAGNOSIS — E6609 Other obesity due to excess calories: Secondary | ICD-10-CM | POA: Diagnosis not present

## 2015-12-14 DIAGNOSIS — I10 Essential (primary) hypertension: Secondary | ICD-10-CM | POA: Diagnosis not present

## 2015-12-14 DIAGNOSIS — N321 Vesicointestinal fistula: Secondary | ICD-10-CM | POA: Diagnosis not present

## 2015-12-14 DIAGNOSIS — Z23 Encounter for immunization: Secondary | ICD-10-CM | POA: Diagnosis not present

## 2015-12-14 DIAGNOSIS — M545 Low back pain: Secondary | ICD-10-CM | POA: Diagnosis not present

## 2015-12-14 DIAGNOSIS — Z6834 Body mass index (BMI) 34.0-34.9, adult: Secondary | ICD-10-CM | POA: Diagnosis not present

## 2015-12-14 DIAGNOSIS — G629 Polyneuropathy, unspecified: Secondary | ICD-10-CM | POA: Diagnosis not present

## 2015-12-14 DIAGNOSIS — E1143 Type 2 diabetes mellitus with diabetic autonomic (poly)neuropathy: Secondary | ICD-10-CM | POA: Diagnosis not present

## 2015-12-14 DIAGNOSIS — R2681 Unsteadiness on feet: Secondary | ICD-10-CM | POA: Diagnosis not present

## 2015-12-14 DIAGNOSIS — M170 Bilateral primary osteoarthritis of knee: Secondary | ICD-10-CM | POA: Diagnosis not present

## 2015-12-14 DIAGNOSIS — H548 Legal blindness, as defined in USA: Secondary | ICD-10-CM | POA: Diagnosis not present

## 2015-12-14 DIAGNOSIS — M810 Age-related osteoporosis without current pathological fracture: Secondary | ICD-10-CM | POA: Diagnosis not present

## 2015-12-18 ENCOUNTER — Ambulatory Visit: Payer: Medicare Other | Admitting: Neurology

## 2015-12-18 ENCOUNTER — Ambulatory Visit: Payer: Medicare Other | Admitting: Cardiology

## 2015-12-19 DIAGNOSIS — M545 Low back pain: Secondary | ICD-10-CM | POA: Diagnosis not present

## 2015-12-19 DIAGNOSIS — M170 Bilateral primary osteoarthritis of knee: Secondary | ICD-10-CM | POA: Diagnosis not present

## 2015-12-19 DIAGNOSIS — M810 Age-related osteoporosis without current pathological fracture: Secondary | ICD-10-CM | POA: Diagnosis not present

## 2015-12-19 DIAGNOSIS — G629 Polyneuropathy, unspecified: Secondary | ICD-10-CM | POA: Diagnosis not present

## 2015-12-19 DIAGNOSIS — H548 Legal blindness, as defined in USA: Secondary | ICD-10-CM | POA: Diagnosis not present

## 2015-12-19 DIAGNOSIS — N321 Vesicointestinal fistula: Secondary | ICD-10-CM | POA: Diagnosis not present

## 2015-12-21 DIAGNOSIS — M545 Low back pain: Secondary | ICD-10-CM | POA: Diagnosis not present

## 2015-12-21 DIAGNOSIS — G629 Polyneuropathy, unspecified: Secondary | ICD-10-CM | POA: Diagnosis not present

## 2015-12-21 DIAGNOSIS — M170 Bilateral primary osteoarthritis of knee: Secondary | ICD-10-CM | POA: Diagnosis not present

## 2015-12-21 DIAGNOSIS — H548 Legal blindness, as defined in USA: Secondary | ICD-10-CM | POA: Diagnosis not present

## 2015-12-21 DIAGNOSIS — M810 Age-related osteoporosis without current pathological fracture: Secondary | ICD-10-CM | POA: Diagnosis not present

## 2015-12-21 DIAGNOSIS — N321 Vesicointestinal fistula: Secondary | ICD-10-CM | POA: Diagnosis not present

## 2015-12-25 DIAGNOSIS — M170 Bilateral primary osteoarthritis of knee: Secondary | ICD-10-CM | POA: Diagnosis not present

## 2015-12-25 DIAGNOSIS — M810 Age-related osteoporosis without current pathological fracture: Secondary | ICD-10-CM | POA: Diagnosis not present

## 2015-12-25 DIAGNOSIS — G629 Polyneuropathy, unspecified: Secondary | ICD-10-CM | POA: Diagnosis not present

## 2015-12-25 DIAGNOSIS — N321 Vesicointestinal fistula: Secondary | ICD-10-CM | POA: Diagnosis not present

## 2015-12-25 DIAGNOSIS — M545 Low back pain: Secondary | ICD-10-CM | POA: Diagnosis not present

## 2015-12-25 DIAGNOSIS — H548 Legal blindness, as defined in USA: Secondary | ICD-10-CM | POA: Diagnosis not present

## 2015-12-28 DIAGNOSIS — M170 Bilateral primary osteoarthritis of knee: Secondary | ICD-10-CM | POA: Diagnosis not present

## 2015-12-28 DIAGNOSIS — G629 Polyneuropathy, unspecified: Secondary | ICD-10-CM | POA: Diagnosis not present

## 2015-12-28 DIAGNOSIS — M810 Age-related osteoporosis without current pathological fracture: Secondary | ICD-10-CM | POA: Diagnosis not present

## 2015-12-28 DIAGNOSIS — M545 Low back pain: Secondary | ICD-10-CM | POA: Diagnosis not present

## 2015-12-28 DIAGNOSIS — H548 Legal blindness, as defined in USA: Secondary | ICD-10-CM | POA: Diagnosis not present

## 2015-12-28 DIAGNOSIS — N321 Vesicointestinal fistula: Secondary | ICD-10-CM | POA: Diagnosis not present

## 2016-01-01 ENCOUNTER — Ambulatory Visit
Admission: RE | Admit: 2016-01-01 | Discharge: 2016-01-01 | Disposition: A | Discharge status: Home / Self Care | Payer: Medicare Other | Source: Ambulatory Visit | Attending: General Surgery | Admitting: General Surgery

## 2016-01-01 DIAGNOSIS — N824 Other female intestinal-genital tract fistulae: Secondary | ICD-10-CM

## 2016-01-01 DIAGNOSIS — K5732 Diverticulitis of large intestine without perforation or abscess without bleeding: Secondary | ICD-10-CM | POA: Diagnosis not present

## 2016-01-01 MED ORDER — IOPAMIDOL (ISOVUE-300) INJECTION 61%
100.0000 mL | Freq: Once | INTRAVENOUS | Status: AC | PRN
  Administered 2016-01-01: 100 mL via INTRAVENOUS

## 2016-01-03 DIAGNOSIS — M3500 Sicca syndrome, unspecified: Secondary | ICD-10-CM | POA: Diagnosis not present

## 2016-01-03 DIAGNOSIS — M3501 Sicca syndrome with keratoconjunctivitis: Secondary | ICD-10-CM | POA: Diagnosis not present

## 2016-01-03 DIAGNOSIS — Z961 Presence of intraocular lens: Secondary | ICD-10-CM | POA: Diagnosis not present

## 2016-01-03 DIAGNOSIS — H18893 Other specified disorders of cornea, bilateral: Secondary | ICD-10-CM | POA: Diagnosis not present

## 2016-01-05 NOTE — Progress Notes (Signed)
Scheduling pre op- please PLACE SURGICAL ORDERS IN EPIC  Thanks 

## 2016-01-08 ENCOUNTER — Other Ambulatory Visit: Payer: Self-pay | Admitting: General Surgery

## 2016-01-08 NOTE — Progress Notes (Signed)
History of Present Illness  Patient words: recheck.  The patient is a 80 year old female who presents with non-malignant abdominal pain. 80 year old female who presents with vague abdominal pain and nausea on occasion. This has been occurring for almost a year. She also has a history of constipation and is using Colace for this currently. She was seen by Dr. Harlan Stains earlier this year and a CT scan was performed. This showed some mild diverticulitis and some nondependent air in the bladder. There was a concern for possible colovaginal fistula. The patient is a longtime patient of Dr. Kalman Shan and underwent a colonoscopy earlier this year, which shows no signs of malignancy. She denies any blood in her stools. She denies any history of frequent UTIs. She does report stool per vagina ~ every other week now.   Problem List/Past Medical  COLOVAGINAL FISTULA (N82.4) DIVERTICULITIS, COLON (K57.32)  Other Problems  Cerebrovascular Accident Back Pain Bladder Problems Atrial Fibrillation  Past Surgical History  Knee Surgery Right. Shoulder Surgery Left. Cataract Surgery Bilateral. Tonsillectomy  Diagnostic Studies History Colonoscopy 5-10 years ago Pap Smear >5 years ago  Allergies  Codeine and Related Nausea. Hydrocodone-Acetaminophen *ANALGESICS - OPIOID* Nausea, Vomiting. Morphine Derivatives Nausea. OxyCODONE HCl *ANALGESICS - OPIOID* Nausea.  Medication History  TraMADol HCl (50MG  Tablet, Oral) Active. Lyrica (150MG  Capsule, Oral) Active. Amiodarone HCl (200MG  Tablet, Oral) Active. Cyclobenzaprine HCl (5MG  Tablet, Oral) Active. DiltiaZEM HCl ER Coated Beads (180MG  Capsule ER 24HR, Oral) Active. Dorzolamide HCl-Timolol Mal (22.3-6.8MG /ML Solution, Ophthalmic) Active. Doxycycline Hyclate (50MG  Capsule, Oral) Active. Eliquis (5MG  Tablet, Oral) Active. Fenofibrate (145MG  Tablet, Oral) Active. Fluticasone Propionate (50MCG/ACT Suspension,  Nasal) Active. Omeprazole (40MG  Capsule DR, Oral) Active. Ondansetron HCl (4MG  Tablet, Oral) Active. Promethazine HCl (25MG  Tablet, Oral) Active. Salagen (5MG  Tablet, Oral) Active. Ativan (1MG  Tablet, Oral) Active. Medications Reconciled Neomycin Sulfate (500MG  Tablet, 2 (two) Tablet Oral SEE NOTE, Taken starting 11/27/2015) Active. (TAKE TWO TABLETS AT 2 PM, 3 PM, AND 10 PM THE DAY PRIOR TO SURGERY) Flagyl (500MG  Tablet, 2 (two) Tablet Oral SEE NOTE, Taken starting 11/27/2015) Active. (Take at 2pm, 3pm, and 10pm the day prior to your colon operation)  Family History  Cancer Father.  Pregnancy / Birth History  Para 2 Maternal age 35-20 Age of menopause 58-55 Gravida 1    Review of Systems  General Not Present- Appetite Loss, Chills, Fatigue, Fever, Night Sweats, Weight Gain and Weight Loss. Skin Present- Non-Healing Wounds. Not Present- Change in Wart/Mole, Dryness, Hives, Jaundice, New Lesions, Rash and Ulcer. HEENT Present- Visual Disturbances. Not Present- Earache, Hearing Loss, Hoarseness, Nose Bleed, Oral Ulcers, Ringing in the Ears, Seasonal Allergies, Sinus Pain, Sore Throat, Wears glasses/contact lenses and Yellow Eyes. Respiratory Present- Snoring. Not Present- Bloody sputum, Chronic Cough, Difficulty Breathing and Wheezing. Breast Not Present- Breast Mass, Breast Pain, Nipple Discharge and Skin Changes. Gastrointestinal Present- Excessive gas. Not Present- Abdominal Pain, Bloating, Bloody Stool, Change in Bowel Habits, Chronic diarrhea, Constipation, Difficulty Swallowing, Gets full quickly at meals, Hemorrhoids, Indigestion, Nausea, Rectal Pain and Vomiting. Musculoskeletal Present- Back Pain and Joint Pain. Not Present- Joint Stiffness, Muscle Pain, Muscle Weakness and Swelling of Extremities. Neurological Present- Numbness, Tingling, Trouble walking and Weakness. Not Present- Decreased Memory, Fainting, Headaches, Seizures and Tremor. Hematology Present- Easy  Bruising. Not Present- Excessive bleeding, Gland problems, HIV and Persistent Infections.  Vitals   Weight: 184.2 lb Height: 63in Body Surface Area: 1.87 m Body Mass Index: 32.63 kg/m  Temp.: 83F(Oral)  Pulse: 82 (Regular)  BP: 122/76 (Sitting, Left Arm,  Standard)   Physical Exam General Mental Status-Alert. General Appearance-Not in acute distress. Build & Nutrition-Well nourished. Posture-Normal posture. Gait-Normal.  Head and Neck Head-normocephalic, atraumatic with no lesions or palpable masses. Trachea-midline.  Chest and Lung Exam Chest and lung exam reveals -on auscultation, normal breath sounds, no adventitious sounds and normal vocal resonance.  Cardiovascular Auscultation Rhythm - Irregular. Heart Sounds - Normal heart sounds.  Abdomen Inspection Inspection of the abdomen reveals - No Hernias. Palpation/Percussion Palpation and Percussion of the abdomen reveal - Soft and No Rigidity (guarding). Tenderness - Note: suprapubic tenderness noted.  Neurologic Neurologic evaluation reveals -alert and oriented x 3 with no impairment of recent or remote memory, normal attention span and ability to concentrate, normal sensation and normal coordination.  Musculoskeletal Normal Exam - Bilateral-Upper Extremity Strength Normal and Lower Extremity Strength Normal.    Assessment & Plan COLOVAGINAL FISTULA (N82.4) Impression: 80 year old female with multiple medical problems who appears to have a progressing colovaginal fistula. She has underwent a colonoscopy which shows no signs of malignancy causing the fistula. We will evaluate the possibility of undergoing a robotic colon surgery for removal of her diseased portion of colon. We will touch base with her cardiologist to see if this is a viable option for her. I would also like to get a rectal contrast CT scan to identify the location of the fistula. The surgery and anatomy were described  to the patient as well as the risks of surgery and the possible complications. These include: Bleeding, deep abdominal infections and possible wound complications such as hernia and infection, damage to adjacent structures, leak of surgical connections, which can lead to other surgeries and possibly an ostomy, possible need for other procedures, such as abscess drains in radiology, possible prolonged hospital stay, possible diarrhea from removal of part of the colon, possible constipation from narcotics, possible bowel, bladder or sexual dysfunction if having rectal surgery, prolonged fatigue/weakness or appetite loss, possible early recurrence of of disease, possible complications of their medical problems such as heart disease or arrhythmias or lung problems, death (less than 1%). I believe the patient understands and wishes to proceed with the surgery. Current Plans

## 2016-01-09 DIAGNOSIS — G629 Polyneuropathy, unspecified: Secondary | ICD-10-CM | POA: Diagnosis not present

## 2016-01-09 DIAGNOSIS — N321 Vesicointestinal fistula: Secondary | ICD-10-CM | POA: Diagnosis not present

## 2016-01-09 DIAGNOSIS — M545 Low back pain: Secondary | ICD-10-CM | POA: Diagnosis not present

## 2016-01-09 DIAGNOSIS — H548 Legal blindness, as defined in USA: Secondary | ICD-10-CM | POA: Diagnosis not present

## 2016-01-09 DIAGNOSIS — M170 Bilateral primary osteoarthritis of knee: Secondary | ICD-10-CM | POA: Diagnosis not present

## 2016-01-09 DIAGNOSIS — M810 Age-related osteoporosis without current pathological fracture: Secondary | ICD-10-CM | POA: Diagnosis not present

## 2016-01-11 DIAGNOSIS — M170 Bilateral primary osteoarthritis of knee: Secondary | ICD-10-CM | POA: Diagnosis not present

## 2016-01-11 DIAGNOSIS — N321 Vesicointestinal fistula: Secondary | ICD-10-CM | POA: Diagnosis not present

## 2016-01-11 DIAGNOSIS — G629 Polyneuropathy, unspecified: Secondary | ICD-10-CM | POA: Diagnosis not present

## 2016-01-11 DIAGNOSIS — H548 Legal blindness, as defined in USA: Secondary | ICD-10-CM | POA: Diagnosis not present

## 2016-01-11 DIAGNOSIS — M545 Low back pain: Secondary | ICD-10-CM | POA: Diagnosis not present

## 2016-01-11 DIAGNOSIS — M810 Age-related osteoporosis without current pathological fracture: Secondary | ICD-10-CM | POA: Diagnosis not present

## 2016-01-17 ENCOUNTER — Other Ambulatory Visit: Payer: Self-pay | Admitting: Cardiology

## 2016-01-29 ENCOUNTER — Ambulatory Visit: Payer: Medicare Other | Admitting: Neurology

## 2016-02-01 ENCOUNTER — Encounter (HOSPITAL_COMMUNITY): Payer: Self-pay | Admitting: *Deleted

## 2016-02-01 ENCOUNTER — Encounter (HOSPITAL_COMMUNITY)
Admission: RE | Admit: 2016-02-01 | Discharge: 2016-02-01 | Disposition: A | Discharge status: Home / Self Care | Payer: Medicare Other | Source: Ambulatory Visit | Attending: General Surgery | Admitting: General Surgery

## 2016-02-01 DIAGNOSIS — E119 Type 2 diabetes mellitus without complications: Secondary | ICD-10-CM | POA: Diagnosis not present

## 2016-02-01 DIAGNOSIS — Z01812 Encounter for preprocedural laboratory examination: Secondary | ICD-10-CM | POA: Insufficient documentation

## 2016-02-01 DIAGNOSIS — K5790 Diverticulosis of intestine, part unspecified, without perforation or abscess without bleeding: Secondary | ICD-10-CM | POA: Diagnosis not present

## 2016-02-01 HISTORY — DX: Other specified postprocedural states: Z98.890

## 2016-02-01 HISTORY — DX: Type 2 diabetes mellitus without complications: E11.9

## 2016-02-01 HISTORY — DX: Other complications of anesthesia, initial encounter: T88.59XA

## 2016-02-01 HISTORY — DX: Adverse effect of unspecified anesthetic, initial encounter: T41.45XA

## 2016-02-01 HISTORY — DX: Nausea with vomiting, unspecified: R11.2

## 2016-02-01 LAB — CBC
HCT: 39.3 % (ref 36.0–46.0)
Hemoglobin: 12.5 g/dL (ref 12.0–15.0)
MCH: 24.4 pg — ABNORMAL LOW (ref 26.0–34.0)
MCHC: 31.8 g/dL (ref 30.0–36.0)
MCV: 76.8 fL — ABNORMAL LOW (ref 78.0–100.0)
PLATELETS: 255 10*3/uL (ref 150–400)
RBC: 5.12 MIL/uL — AB (ref 3.87–5.11)
RDW: 17 % — AB (ref 11.5–15.5)
WBC: 10.4 10*3/uL (ref 4.0–10.5)

## 2016-02-01 LAB — GLUCOSE, CAPILLARY: GLUCOSE-CAPILLARY: 110 mg/dL — AB (ref 65–99)

## 2016-02-01 LAB — BASIC METABOLIC PANEL
Anion gap: 8 (ref 5–15)
BUN: 14 mg/dL (ref 6–20)
CHLORIDE: 96 mmol/L — AB (ref 101–111)
CO2: 27 mmol/L (ref 22–32)
CREATININE: 0.94 mg/dL (ref 0.44–1.00)
Calcium: 10 mg/dL (ref 8.9–10.3)
GFR, EST NON AFRICAN AMERICAN: 57 mL/min — AB (ref >60)
Glucose, Bld: 121 mg/dL — ABNORMAL HIGH (ref 65–99)
Potassium: 3.9 mmol/L (ref 3.5–5.1)
SODIUM: 131 mmol/L — AB (ref 135–145)

## 2016-02-01 LAB — ABO/RH: ABO/RH(D): O NEG

## 2016-02-01 NOTE — Patient Instructions (Signed)
DESAREE PETRY  02/01/2016   Your procedure is scheduled on: 02-07-16   Report to [facility] Main  Entrance take [facility]  elevators to 3rd floor to  [place] at   [place] AM.  Call this number if you have problems the morning of surgery [phone]  Bowel prep per MD instructions(Drink Clear Liquids plentiful day of prep).  Remember: ONLY 1 PERSON MAY GO WITH YOU TO SHORT STAY TO GET  READY MORNING OF YOUR SURGERY.  Do not eat food or drink liquids :After Midnight.     Take these medicines the morning of surgery with A SIP OF WATER:  Diltiazem. Fenofibrate. Omeprazole. Lyrica. Eye drops-usual. DO NOT TAKE ANY DIABETIC MEDICATIONS DAY OF YOUR SURGERY                               You may not have any metal on your body including hair pins and              piercings  Do not wear jewelry, make-up, lotions, powders or perfumes, deodorant             Do not wear nail polish.  Do not shave  48 hours prior to surgery.              Men may shave face and neck.   Do not bring valuables to the hospital. [place].  Contacts, dentures or bridgework may not be worn into surgery.  Leave suitcase in the car. After surgery it may be brought to your room.     Patients discharged the day of surgery will not be allowed to drive home.  Name and phone number of your driver: Denyse Amass -spouse [phone] home/ [phone] cell  Special Instructions: N/A              Please read over the following fact sheets you were given: _____________________________________________________________________             [facility] - Preparing for Surgery Before surgery, you can play an important role.  Because skin is not sterile, your skin needs to be as free of germs as possible.  You can reduce the number of germs on your skin by washing with CHG (chlorahexidine gluconate) soap before surgery.  CHG is an antiseptic cleaner which  kills germs and bonds with the skin to continue killing germs even after washing. Please DO NOT use if you have an allergy to CHG or antibacterial soaps.  If your skin becomes reddened/irritated stop using the CHG and inform your nurse when you arrive at Short Stay. Do not shave (including legs and underarms) for at least 48 hours prior to the first CHG shower.  You may shave your face/neck. Please follow these instructions carefully:  1.  Shower with CHG Soap the night before surgery and the  morning of Surgery.  2.  If you choose to wash your hair, wash your hair first as usual with your  normal  shampoo.  3.  After you shampoo, rinse your hair and body thoroughly to remove the  shampoo.  4.  Use CHG as you would any other liquid soap.  You can apply chg directly  to the skin and wash                       Gently with a scrungie or clean washcloth.  5.  Apply the CHG Soap to your body ONLY FROM THE NECK DOWN.   Do not use on face/ open                           Wound or open sores. Avoid contact with eyes, ears mouth and genitals (private parts).                       Wash face,  Genitals (private parts) with your normal soap.             6.  Wash thoroughly, paying special attention to the area where your surgery  will be performed.  7.  Thoroughly rinse your body with warm water from the neck down.  8.  DO NOT shower/wash with your normal soap after using and rinsing off  the CHG Soap.                9.  Pat yourself dry with a clean towel.            10.  Wear clean pajamas.            11.  Place clean sheets on your bed the night of your first shower and do not  sleep with pets. Day of Surgery : Do not apply any lotions/deodorants the morning of surgery.  Please wear clean clothes to the hospital/surgery center.  FAILURE TO FOLLOW THESE INSTRUCTIONS MAY RESULT IN THE CANCELLATION OF YOUR SURGERY PATIENT SIGNATURE_________________________________  NURSE  SIGNATURE__________________________________  ________________________________________________________________________

## 2016-02-01 NOTE — Pre-Procedure Instructions (Signed)
EKG 9'17, Echo 8'16, Stress 8'16 Epic. Dr. Stanford Breed LOV [phone] Epic

## 2016-02-02 LAB — HEMOGLOBIN A1C
HEMOGLOBIN A1C: 5.7 % — AB (ref 4.8–5.6)
MEAN PLASMA GLUCOSE: 117 mg/dL

## 2016-02-07 ENCOUNTER — Inpatient Hospital Stay (HOSPITAL_COMMUNITY): Payer: Medicare Other | Admitting: Anesthesiology

## 2016-02-07 ENCOUNTER — Inpatient Hospital Stay (HOSPITAL_COMMUNITY)
Admission: RE | Admit: 2016-02-07 | Discharge: 2016-02-13 | DRG: 982 | Disposition: A | Discharge status: Skilled Nursing Facility | Payer: Medicare Other | Source: Ambulatory Visit | Attending: General Surgery | Admitting: General Surgery

## 2016-02-07 ENCOUNTER — Encounter (HOSPITAL_COMMUNITY): Payer: Self-pay | Admitting: Anesthesiology

## 2016-02-07 ENCOUNTER — Encounter (HOSPITAL_COMMUNITY)
Admission: RE | Disposition: A | Discharge status: Skilled Nursing Facility | Payer: Self-pay | Source: Ambulatory Visit | Attending: General Surgery

## 2016-02-07 DIAGNOSIS — R0902 Hypoxemia: Secondary | ICD-10-CM | POA: Diagnosis not present

## 2016-02-07 DIAGNOSIS — R278 Other lack of coordination: Secondary | ICD-10-CM | POA: Diagnosis not present

## 2016-02-07 DIAGNOSIS — G4730 Sleep apnea, unspecified: Secondary | ICD-10-CM | POA: Diagnosis present

## 2016-02-07 DIAGNOSIS — F411 Generalized anxiety disorder: Secondary | ICD-10-CM | POA: Diagnosis present

## 2016-02-07 DIAGNOSIS — G40909 Epilepsy, unspecified, not intractable, without status epilepticus: Secondary | ICD-10-CM

## 2016-02-07 DIAGNOSIS — I10 Essential (primary) hypertension: Secondary | ICD-10-CM | POA: Diagnosis present

## 2016-02-07 DIAGNOSIS — K219 Gastro-esophageal reflux disease without esophagitis: Secondary | ICD-10-CM | POA: Diagnosis present

## 2016-02-07 DIAGNOSIS — E538 Deficiency of other specified B group vitamins: Secondary | ICD-10-CM | POA: Diagnosis present

## 2016-02-07 DIAGNOSIS — E781 Pure hyperglyceridemia: Secondary | ICD-10-CM | POA: Diagnosis present

## 2016-02-07 DIAGNOSIS — Z809 Family history of malignant neoplasm, unspecified: Secondary | ICD-10-CM

## 2016-02-07 DIAGNOSIS — R531 Weakness: Secondary | ICD-10-CM | POA: Diagnosis not present

## 2016-02-07 DIAGNOSIS — Z6833 Body mass index (BMI) 33.0-33.9, adult: Secondary | ICD-10-CM | POA: Diagnosis not present

## 2016-02-07 DIAGNOSIS — N329 Bladder disorder, unspecified: Secondary | ICD-10-CM | POA: Diagnosis not present

## 2016-02-07 DIAGNOSIS — K632 Fistula of intestine: Secondary | ICD-10-CM | POA: Diagnosis not present

## 2016-02-07 DIAGNOSIS — K5720 Diverticulitis of large intestine with perforation and abscess without bleeding: Secondary | ICD-10-CM | POA: Diagnosis not present

## 2016-02-07 DIAGNOSIS — L988 Other specified disorders of the skin and subcutaneous tissue: Secondary | ICD-10-CM | POA: Diagnosis not present

## 2016-02-07 DIAGNOSIS — G8929 Other chronic pain: Secondary | ICD-10-CM | POA: Diagnosis present

## 2016-02-07 DIAGNOSIS — M3500 Sicca syndrome, unspecified: Secondary | ICD-10-CM | POA: Diagnosis present

## 2016-02-07 DIAGNOSIS — I4891 Unspecified atrial fibrillation: Secondary | ICD-10-CM | POA: Diagnosis present

## 2016-02-07 DIAGNOSIS — E119 Type 2 diabetes mellitus without complications: Secondary | ICD-10-CM

## 2016-02-07 DIAGNOSIS — R2689 Other abnormalities of gait and mobility: Secondary | ICD-10-CM | POA: Diagnosis not present

## 2016-02-07 DIAGNOSIS — E669 Obesity, unspecified: Secondary | ICD-10-CM | POA: Diagnosis present

## 2016-02-07 DIAGNOSIS — N321 Vesicointestinal fistula: Secondary | ICD-10-CM | POA: Diagnosis present

## 2016-02-07 DIAGNOSIS — E782 Mixed hyperlipidemia: Secondary | ICD-10-CM | POA: Diagnosis not present

## 2016-02-07 DIAGNOSIS — G40109 Localization-related (focal) (partial) symptomatic epilepsy and epileptic syndromes with simple partial seizures, not intractable, without status epilepticus: Secondary | ICD-10-CM | POA: Diagnosis present

## 2016-02-07 DIAGNOSIS — D72829 Elevated white blood cell count, unspecified: Secondary | ICD-10-CM | POA: Diagnosis present

## 2016-02-07 DIAGNOSIS — G609 Hereditary and idiopathic neuropathy, unspecified: Secondary | ICD-10-CM | POA: Diagnosis present

## 2016-02-07 DIAGNOSIS — N322 Vesical fistula, not elsewhere classified: Secondary | ICD-10-CM | POA: Diagnosis not present

## 2016-02-07 DIAGNOSIS — M6281 Muscle weakness (generalized): Secondary | ICD-10-CM | POA: Diagnosis not present

## 2016-02-07 DIAGNOSIS — N3289 Other specified disorders of bladder: Secondary | ICD-10-CM | POA: Diagnosis not present

## 2016-02-07 DIAGNOSIS — R109 Unspecified abdominal pain: Secondary | ICD-10-CM | POA: Diagnosis not present

## 2016-02-07 HISTORY — PX: ROBOT ASSISTED LAPAROSCOPIC PARTIAL COLECTOMY: SHX6476

## 2016-02-07 HISTORY — DX: Hypo-osmolality and hyponatremia: E87.1

## 2016-02-07 HISTORY — DX: Personal history of other diseases of the digestive system: Z87.19

## 2016-02-07 HISTORY — DX: Acute respiratory failure with hypoxia: J96.01

## 2016-02-07 HISTORY — DX: Personal history of other specified conditions: Z87.898

## 2016-02-07 LAB — GLUCOSE, CAPILLARY
GLUCOSE-CAPILLARY: 105 mg/dL — AB (ref 65–99)
Glucose-Capillary: 126 mg/dL — ABNORMAL HIGH (ref 65–99)

## 2016-02-07 LAB — TYPE AND SCREEN
ABO/RH(D): O NEG
ANTIBODY SCREEN: NEGATIVE

## 2016-02-07 LAB — PROTIME-INR
INR: 0.97
Prothrombin Time: 12.9 seconds (ref 11.4–15.2)

## 2016-02-07 SURGERY — ROBOT ASSISTED LAPAROSCOPIC PARTIAL COLECTOMY
Anesthesia: General | Site: Abdomen

## 2016-02-07 MED ORDER — DORZOLAMIDE HCL 2 % OP SOLN
1.0000 [drp] | Freq: Two times a day (BID) | OPHTHALMIC | Status: DC
  Administered 2016-02-07 – 2016-02-13 (×12): 1 [drp] via OPHTHALMIC
  Filled 2016-02-07: qty 10

## 2016-02-07 MED ORDER — PANTOPRAZOLE SODIUM 40 MG PO TBEC
40.0000 mg | DELAYED_RELEASE_TABLET | Freq: Every day | ORAL | Status: DC
  Administered 2016-02-07 – 2016-02-11 (×5): 40 mg via ORAL
  Filled 2016-02-07 (×5): qty 1

## 2016-02-07 MED ORDER — SODIUM CHLORIDE 0.9 % IJ SOLN
INTRAMUSCULAR | Status: DC | PRN
  Administered 2016-02-07: 20 mL

## 2016-02-07 MED ORDER — DILTIAZEM HCL ER COATED BEADS 180 MG PO CP24
180.0000 mg | ORAL_CAPSULE | Freq: Every day | ORAL | Status: DC
  Administered 2016-02-08 – 2016-02-13 (×6): 180 mg via ORAL
  Filled 2016-02-07 (×6): qty 1

## 2016-02-07 MED ORDER — METHYLENE BLUE 0.5 % INJ SOLN
INTRAVENOUS | Status: AC
  Filled 2016-02-07: qty 10

## 2016-02-07 MED ORDER — CEFOTETAN DISODIUM-DEXTROSE 2-2.08 GM-% IV SOLR
INTRAVENOUS | Status: AC
  Filled 2016-02-07: qty 50

## 2016-02-07 MED ORDER — BUPIVACAINE LIPOSOME 1.3 % IJ SUSP
20.0000 mL | Freq: Once | INTRAMUSCULAR | Status: AC
  Administered 2016-02-07: 20 mL
  Filled 2016-02-07: qty 20

## 2016-02-07 MED ORDER — FENTANYL CITRATE (PF) 250 MCG/5ML IJ SOLN
INTRAMUSCULAR | Status: AC
  Filled 2016-02-07: qty 5

## 2016-02-07 MED ORDER — PHENYLEPHRINE 40 MCG/ML (10ML) SYRINGE FOR IV PUSH (FOR BLOOD PRESSURE SUPPORT)
PREFILLED_SYRINGE | INTRAVENOUS | Status: AC
  Filled 2016-02-07: qty 10

## 2016-02-07 MED ORDER — DEXTROSE 5 % IV SOLN
2.0000 g | Freq: Two times a day (BID) | INTRAVENOUS | Status: AC
  Administered 2016-02-07: 2 g via INTRAVENOUS
  Filled 2016-02-07: qty 2

## 2016-02-07 MED ORDER — FENTANYL CITRATE (PF) 100 MCG/2ML IJ SOLN
INTRAMUSCULAR | Status: DC | PRN
  Administered 2016-02-07 (×5): 50 ug via INTRAVENOUS

## 2016-02-07 MED ORDER — HYDROMORPHONE HCL 1 MG/ML IJ SOLN
0.5000 mg | INTRAMUSCULAR | Status: DC | PRN
  Administered 2016-02-07 – 2016-02-10 (×4): 0.5 mg via INTRAVENOUS
  Filled 2016-02-07 (×5): qty 1

## 2016-02-07 MED ORDER — CYCLOBENZAPRINE HCL 5 MG PO TABS
5.0000 mg | ORAL_TABLET | Freq: Every day | ORAL | Status: DC
  Administered 2016-02-07 – 2016-02-12 (×6): 5 mg via ORAL
  Filled 2016-02-07 (×6): qty 1

## 2016-02-07 MED ORDER — METFORMIN HCL ER 500 MG PO TB24
500.0000 mg | ORAL_TABLET | Freq: Every day | ORAL | Status: DC
  Administered 2016-02-08 – 2016-02-13 (×6): 500 mg via ORAL
  Filled 2016-02-07 (×6): qty 1

## 2016-02-07 MED ORDER — ONDANSETRON HCL 4 MG/2ML IJ SOLN
INTRAMUSCULAR | Status: AC
  Filled 2016-02-07: qty 2

## 2016-02-07 MED ORDER — DEXTROSE 5 % IV SOLN
2.0000 g | INTRAVENOUS | Status: AC
  Administered 2016-02-07: 2 g via INTRAVENOUS

## 2016-02-07 MED ORDER — ACETAMINOPHEN 500 MG PO TABS
1000.0000 mg | ORAL_TABLET | ORAL | Status: AC
  Administered 2016-02-07: 1000 mg via ORAL
  Filled 2016-02-07: qty 2

## 2016-02-07 MED ORDER — PROPOFOL 10 MG/ML IV BOLUS
INTRAVENOUS | Status: AC
  Filled 2016-02-07: qty 20

## 2016-02-07 MED ORDER — SUGAMMADEX SODIUM 500 MG/5ML IV SOLN
INTRAVENOUS | Status: AC
  Filled 2016-02-07: qty 5

## 2016-02-07 MED ORDER — PROPOFOL 10 MG/ML IV BOLUS
INTRAVENOUS | Status: DC | PRN
  Administered 2016-02-07: 90 mg via INTRAVENOUS

## 2016-02-07 MED ORDER — PHENYLEPHRINE HCL 10 MG/ML IJ SOLN
INTRAMUSCULAR | Status: DC | PRN
  Administered 2016-02-07: 40 ug via INTRAVENOUS

## 2016-02-07 MED ORDER — FENTANYL CITRATE (PF) 100 MCG/2ML IJ SOLN
INTRAMUSCULAR | Status: AC
  Filled 2016-02-07: qty 2

## 2016-02-07 MED ORDER — PREGABALIN 75 MG PO CAPS: 150.0000 mg | ORAL_CAPSULE | Freq: Two times a day (BID) | ORAL | Status: DC

## 2016-02-07 MED ORDER — ONDANSETRON HCL 4 MG/2ML IJ SOLN: 4.0000 mg | Freq: Four times a day (QID) | INTRAMUSCULAR | Status: DC | PRN

## 2016-02-07 MED ORDER — ALVIMOPAN 12 MG PO CAPS
12.0000 mg | ORAL_CAPSULE | Freq: Once | ORAL | Status: AC
  Administered 2016-02-07: 12 mg via ORAL
  Filled 2016-02-07: qty 1

## 2016-02-07 MED ORDER — ONDANSETRON HCL 4 MG/2ML IJ SOLN: 4.0000 mg | Freq: Once | INTRAMUSCULAR | Status: DC | PRN

## 2016-02-07 MED ORDER — PREGABALIN 75 MG PO CAPS
150.0000 mg | ORAL_CAPSULE | Freq: Every day | ORAL | Status: DC
  Administered 2016-02-08: 150 mg via ORAL
  Filled 2016-02-07: qty 2

## 2016-02-07 MED ORDER — LACTATED RINGERS IR SOLN
Status: DC | PRN
  Administered 2016-02-07: 3000 mL

## 2016-02-07 MED ORDER — SODIUM CHLORIDE 0.9 % IV BOLUS (SEPSIS)
500.0000 mL | Freq: Once | INTRAVENOUS | Status: AC
  Administered 2016-02-07: 500 mL via INTRAVENOUS

## 2016-02-07 MED ORDER — ROCURONIUM BROMIDE 50 MG/5ML IV SOSY
PREFILLED_SYRINGE | INTRAVENOUS | Status: AC
  Filled 2016-02-07: qty 5

## 2016-02-07 MED ORDER — LIDOCAINE 2% (20 MG/ML) 5 ML SYRINGE
INTRAMUSCULAR | Status: AC
  Filled 2016-02-07: qty 5

## 2016-02-07 MED ORDER — ONDANSETRON HCL 4 MG/2ML IJ SOLN
INTRAMUSCULAR | Status: DC | PRN
  Administered 2016-02-07: 4 mg via INTRAVENOUS

## 2016-02-07 MED ORDER — ACETAMINOPHEN 500 MG PO TABS
1000.0000 mg | ORAL_TABLET | Freq: Four times a day (QID) | ORAL | Status: DC
  Administered 2016-02-07 – 2016-02-08 (×3): 1000 mg via ORAL
  Filled 2016-02-07 (×3): qty 2

## 2016-02-07 MED ORDER — FENTANYL CITRATE (PF) 100 MCG/2ML IJ SOLN
25.0000 ug | INTRAMUSCULAR | Status: DC | PRN
Start: 2016-02-07 — End: 2016-02-07
  Administered 2016-02-07 (×2): 25 ug via INTRAVENOUS

## 2016-02-07 MED ORDER — DIPHENHYDRAMINE HCL 50 MG/ML IJ SOLN: 12.5000 mg | Freq: Four times a day (QID) | INTRAMUSCULAR | Status: DC | PRN

## 2016-02-07 MED ORDER — ONDANSETRON HCL 4 MG PO TABS: 4.0000 mg | ORAL_TABLET | Freq: Four times a day (QID) | ORAL | Status: DC | PRN

## 2016-02-07 MED ORDER — GABAPENTIN 300 MG PO CAPS
300.0000 mg | ORAL_CAPSULE | ORAL | Status: AC
  Administered 2016-02-07: 300 mg via ORAL
  Filled 2016-02-07: qty 1

## 2016-02-07 MED ORDER — 0.9 % SODIUM CHLORIDE (POUR BTL) OPTIME
TOPICAL | Status: DC | PRN
  Administered 2016-02-07: 2000 mL

## 2016-02-07 MED ORDER — SODIUM CHLORIDE 0.9 % IJ SOLN
INTRAMUSCULAR | Status: AC
  Filled 2016-02-07: qty 50

## 2016-02-07 MED ORDER — KCL IN DEXTROSE-NACL 20-5-0.45 MEQ/L-%-% IV SOLN
INTRAVENOUS | Status: DC
  Administered 2016-02-07 – 2016-02-08 (×2): via INTRAVENOUS
  Filled 2016-02-07 (×2): qty 1000

## 2016-02-07 MED ORDER — PREGABALIN 75 MG PO CAPS
300.0000 mg | ORAL_CAPSULE | Freq: Every day | ORAL | Status: DC
  Administered 2016-02-07 – 2016-02-12 (×6): 300 mg via ORAL
  Filled 2016-02-07 (×6): qty 4

## 2016-02-07 MED ORDER — LACTATED RINGERS IV SOLN
INTRAVENOUS | Status: DC
  Administered 2016-02-07 (×2): via INTRAVENOUS

## 2016-02-07 MED ORDER — ROCURONIUM BROMIDE 10 MG/ML (PF) SYRINGE
PREFILLED_SYRINGE | INTRAVENOUS | Status: DC | PRN
  Administered 2016-02-07: 50 mg via INTRAVENOUS
  Administered 2016-02-07: 20 mg via INTRAVENOUS
  Administered 2016-02-07: 10 mg via INTRAVENOUS

## 2016-02-07 MED ORDER — ENOXAPARIN SODIUM 40 MG/0.4ML ~~LOC~~ SOLN
40.0000 mg | SUBCUTANEOUS | Status: DC
  Administered 2016-02-08 – 2016-02-10 (×3): 40 mg via SUBCUTANEOUS
  Filled 2016-02-07 (×3): qty 0.4

## 2016-02-07 MED ORDER — DIPHENHYDRAMINE HCL 12.5 MG/5ML PO ELIX: 12.5000 mg | ORAL_SOLUTION | Freq: Four times a day (QID) | ORAL | Status: DC | PRN

## 2016-02-07 MED ORDER — TIMOLOL MALEATE 0.5 % OP SOLN
1.0000 [drp] | Freq: Two times a day (BID) | OPHTHALMIC | Status: DC
  Administered 2016-02-07 – 2016-02-13 (×12): 1 [drp] via OPHTHALMIC
  Filled 2016-02-07: qty 5

## 2016-02-07 MED ORDER — SUGAMMADEX SODIUM 200 MG/2ML IV SOLN
INTRAVENOUS | Status: DC | PRN
  Administered 2016-02-07: 170 mg via INTRAVENOUS

## 2016-02-07 MED ORDER — ALVIMOPAN 12 MG PO CAPS
12.0000 mg | ORAL_CAPSULE | Freq: Two times a day (BID) | ORAL | Status: DC
  Administered 2016-02-07 – 2016-02-11 (×8): 12 mg via ORAL
  Filled 2016-02-07 (×8): qty 1

## 2016-02-07 MED ORDER — TRAZODONE HCL 50 MG PO TABS: 25.0000 mg | ORAL_TABLET | Freq: Every evening | ORAL | Status: DC | PRN

## 2016-02-07 MED ORDER — TIMOLOL HEMIHYDRATE 0.5 % OP SOLN: 1.0000 [drp] | Freq: Two times a day (BID) | OPHTHALMIC | Status: DC

## 2016-02-07 MED ORDER — FENOFIBRATE 160 MG PO TABS
160.0000 mg | ORAL_TABLET | Freq: Every day | ORAL | Status: DC
  Administered 2016-02-07 – 2016-02-13 (×7): 160 mg via ORAL
  Filled 2016-02-07 (×7): qty 1

## 2016-02-07 SURGICAL SUPPLY — 104 items
ADH SKN CLS APL DERMABOND .7 (GAUZE/BANDAGES/DRESSINGS) ×2
BAG LAPAROSCOPIC 12 15 PORT 16 (BASKET) ×1 IMPLANT
BAG RETRIEVAL 12/15 (BASKET) ×3
BLADE EXTENDED COATED 6.5IN (ELECTRODE) IMPLANT
CANNULA REDUC XI 12-8 STAPL (CANNULA) ×1
CANNULA REDUCER 12-8 DVNC XI (CANNULA) ×2 IMPLANT
CELLS DAT CNTRL 66122 CELL SVR (MISCELLANEOUS) IMPLANT
CLIP LIGATING HEM O LOK PURPLE (MISCELLANEOUS) IMPLANT
CLIP LIGATING HEMOLOK MED (MISCELLANEOUS) IMPLANT
COUNTER NEEDLE 20 DBL MAG RED (NEEDLE) ×3 IMPLANT
COVER MAYO STAND STRL (DRAPES) ×6 IMPLANT
COVER TIP SHEARS 8 DVNC (MISCELLANEOUS) ×2 IMPLANT
COVER TIP SHEARS 8MM DA VINCI (MISCELLANEOUS) ×1
DECANTER SPIKE VIAL GLASS SM (MISCELLANEOUS) ×3 IMPLANT
DERMABOND ADVANCED (GAUZE/BANDAGES/DRESSINGS) ×1
DERMABOND ADVANCED .7 DNX12 (GAUZE/BANDAGES/DRESSINGS) ×1 IMPLANT
DEVICE TROCAR PUNCTURE CLOSURE (ENDOMECHANICALS) IMPLANT
DRAIN CHANNEL 19F RND (DRAIN) ×2 IMPLANT
DRAPE ARM DVNC X/XI (DISPOSABLE) ×8 IMPLANT
DRAPE COLUMN DVNC XI (DISPOSABLE) ×2 IMPLANT
DRAPE DA VINCI XI ARM (DISPOSABLE) ×4
DRAPE DA VINCI XI COLUMN (DISPOSABLE) ×1
DRAPE SURG IRRIG POUCH 19X23 (DRAPES) ×3 IMPLANT
DRSG OPSITE POSTOP 4X10 (GAUZE/BANDAGES/DRESSINGS) IMPLANT
DRSG OPSITE POSTOP 4X6 (GAUZE/BANDAGES/DRESSINGS) IMPLANT
DRSG OPSITE POSTOP 4X8 (GAUZE/BANDAGES/DRESSINGS) ×2 IMPLANT
ELECT PENCIL ROCKER SW 15FT (MISCELLANEOUS) ×6 IMPLANT
ELECT REM PT RETURN 15FT ADLT (MISCELLANEOUS) ×3 IMPLANT
ENDOLOOP SUT PDS II  0 18 (SUTURE)
ENDOLOOP SUT PDS II 0 18 (SUTURE) IMPLANT
EVACUATOR SILICONE 100CC (DRAIN) ×2 IMPLANT
GAUZE SPONGE 4X4 12PLY STRL (GAUZE/BANDAGES/DRESSINGS) IMPLANT
GLOVE BIO SURGEON STRL SZ 6.5 (GLOVE) ×9 IMPLANT
GLOVE BIOGEL PI IND STRL 7.0 (GLOVE) ×6 IMPLANT
GLOVE BIOGEL PI INDICATOR 7.0 (GLOVE) ×3
GOWN STRL REUS W/TWL 2XL LVL3 (GOWN DISPOSABLE) ×9 IMPLANT
GOWN STRL REUS W/TWL XL LVL3 (GOWN DISPOSABLE) ×12 IMPLANT
GRASPER ENDOPATH ANVIL 10MM (MISCELLANEOUS) IMPLANT
HOLDER FOLEY CATH W/STRAP (MISCELLANEOUS) ×3 IMPLANT
IRRIG SUCT STRYKERFLOW 2 WTIP (MISCELLANEOUS) ×3
IRRIGATION SUCT STRKRFLW 2 WTP (MISCELLANEOUS) ×2 IMPLANT
KIT PROCEDURE DA VINCI SI (MISCELLANEOUS) ×1
KIT PROCEDURE DVNC SI (MISCELLANEOUS) ×2 IMPLANT
LEGGING LITHOTOMY PAIR STRL (DRAPES) ×3 IMPLANT
NDL INSUFFLATION 14GA 120MM (NEEDLE) ×1 IMPLANT
NEEDLE INSUFFLATION 14GA 120MM (NEEDLE) ×3 IMPLANT
PACK CARDIOVASCULAR III (CUSTOM PROCEDURE TRAY) ×3 IMPLANT
PACK COLON (CUSTOM PROCEDURE TRAY) ×3 IMPLANT
PORT LAP GEL ALEXIS MED 5-9CM (MISCELLANEOUS) IMPLANT
RELOAD STAPLE 45 3.5 WHT DVNC (STAPLE) IMPLANT
RELOAD STAPLE 45 BLU REG DVNC (STAPLE) IMPLANT
RELOAD STAPLE 45 GRN THCK DVNC (STAPLE) IMPLANT
RETRACTOR WND ALEXIS 18 MED (MISCELLANEOUS) IMPLANT
RTRCTR WOUND ALEXIS 18CM MED (MISCELLANEOUS)
SCISSORS LAP 5X35 DISP (ENDOMECHANICALS) ×3 IMPLANT
SEAL CANN UNIV 5-8 DVNC XI (MISCELLANEOUS) ×6 IMPLANT
SEAL XI 5MM-8MM UNIVERSAL (MISCELLANEOUS) ×3
SEALER VESSEL DA VINCI XI (MISCELLANEOUS) ×1
SEALER VESSEL EXT DVNC XI (MISCELLANEOUS) ×2 IMPLANT
SET BI-LUMEN FLTR TB AIRSEAL (TUBING) ×3 IMPLANT
SLEEVE ADV FIXATION 5X100MM (TROCAR) IMPLANT
SOLUTION ELECTROLUBE (MISCELLANEOUS) ×3 IMPLANT
SPONGE DRAIN TRACH 4X4 STRL 2S (GAUZE/BANDAGES/DRESSINGS) ×2 IMPLANT
STAPLER 45 BLU RELOAD XI (STAPLE) ×4 IMPLANT
STAPLER 45 BLUE RELOAD XI (STAPLE) ×2
STAPLER 45 GREEN RELOAD XI (STAPLE)
STAPLER 45 GRN RELOAD XI (STAPLE) IMPLANT
STAPLER 45 WHITE RELOAD XI (STAPLE) ×4
STAPLER 45 WHT RELOAD XI (STAPLE) ×8 IMPLANT
STAPLER CANNULA SEAL DVNC XI (STAPLE) ×2 IMPLANT
STAPLER CANNULA SEAL XI (STAPLE) ×1
STAPLER CIRC ILS CVD 33MM 37CM (STAPLE) ×2 IMPLANT
STAPLER SHEATH (SHEATH) ×1
STAPLER SHEATH ENDOWRIST DVNC (SHEATH) ×2 IMPLANT
STAPLER VISISTAT 35W (STAPLE) ×3 IMPLANT
SUT ETHILON 2 0 PS N (SUTURE) ×2 IMPLANT
SUT NOVA NAB GS-21 0 18 T12 DT (SUTURE) ×6 IMPLANT
SUT PDS AB 1 CTX 36 (SUTURE) IMPLANT
SUT PDS AB 1 TP1 96 (SUTURE) IMPLANT
SUT PROLENE 2 0 KS (SUTURE) ×3 IMPLANT
SUT SILK 2 0 (SUTURE) ×3
SUT SILK 2 0 SH CR/8 (SUTURE) ×3 IMPLANT
SUT SILK 2-0 18XBRD TIE 12 (SUTURE) ×2 IMPLANT
SUT SILK 3 0 (SUTURE) ×3
SUT SILK 3 0 SH CR/8 (SUTURE) ×3 IMPLANT
SUT SILK 3-0 18XBRD TIE 12 (SUTURE) ×2 IMPLANT
SUT V-LOC BARB 180 2/0GR6 GS22 (SUTURE)
SUT VIC AB 2-0 SH 18 (SUTURE) ×3 IMPLANT
SUT VIC AB 2-0 SH 27 (SUTURE) ×3
SUT VIC AB 2-0 SH 27X BRD (SUTURE) ×2 IMPLANT
SUT VIC AB 3-0 SH 18 (SUTURE) IMPLANT
SUT VIC AB 4-0 PS2 27 (SUTURE) ×6 IMPLANT
SUT VLOC 180 2-0 9IN GS21 (SUTURE) ×4 IMPLANT
SUT VLOC BARB 180 ABS3/0GR12 (SUTURE) ×3
SUTURE V-LC BRB 180 2/0GR6GS22 (SUTURE) IMPLANT
SUTURE VLOC BRB 180 ABS3/0GR12 (SUTURE) ×1 IMPLANT
SYR 10ML LL (SYRINGE) ×3 IMPLANT
SYS LAPSCP GELPORT 120MM (MISCELLANEOUS)
SYSTEM LAPSCP GELPORT 120MM (MISCELLANEOUS) IMPLANT
TOWEL OR 17X26 10 PK STRL BLUE (TOWEL DISPOSABLE) ×3 IMPLANT
TOWEL OR NON WOVEN STRL DISP B (DISPOSABLE) ×3 IMPLANT
TRAY FOLEY W/METER SILVER 16FR (SET/KITS/TRAYS/PACK) ×3 IMPLANT
TROCAR ADV FIXATION 5X100MM (TROCAR) ×3 IMPLANT
TUBING CONNECTING 10 (TUBING) IMPLANT

## 2016-02-07 NOTE — Transfer of Care (Signed)
Immediate Anesthesia Transfer of Care Note  Patient: Krystal Clarke  Procedure(s) Performed: Procedure(s): XI ROBOTIC ASSISTED SIGMOIDECTOMY WITH CLOSURE OF COLOVESICAL FISTULA (N/A)  Patient Location: PACU  Anesthesia Type:General  Level of Consciousness:  sedated, patient cooperative and responds to stimulation  Airway & Oxygen Therapy:Patient Spontanous Breathing and Patient connected to face mask oxgen  Post-op Assessment:  Report given to PACU RN and Post -op Vital signs reviewed and stable  Post vital signs:  Reviewed and stable  Last Vitals:  Vitals:   02/07/16 0713  BP: 109/71  Pulse: 71  Resp: 16  Temp: 123456 C    Complications: No apparent anesthesia complications

## 2016-02-07 NOTE — Progress Notes (Signed)
Patient  States she vomited up all the antibiotics w bowel prep yesterday. And still vomiting this am

## 2016-02-07 NOTE — Anesthesia Preprocedure Evaluation (Addendum)
Anesthesia Evaluation  Patient identified by MRN, date of birth, ID band Patient awake    Reviewed: Allergy & Precautions, NPO status , Patient's Chart, lab work & pertinent test results  Airway Mallampati: II  TM Distance: >3 FB Neck ROM: Full    Dental  (+) Edentulous Upper, Edentulous Lower   Pulmonary former smoker,    breath sounds clear to auscultation       Cardiovascular hypertension,  Rhythm:Irregular Rate:Normal     Neuro/Psych    GI/Hepatic   Endo/Other  diabetes  Renal/GU      Musculoskeletal   Abdominal   Peds  Hematology   Anesthesia Other Findings   Reproductive/Obstetrics                            Anesthesia Physical Anesthesia Plan  ASA: III  Anesthesia Plan: General   Post-op Pain Management:    Induction: Intravenous  Airway Management Planned: Oral ETT  Additional Equipment:   Intra-op Plan:   Post-operative Plan: Extubation in OR  Informed Consent: I have reviewed the patients History and Physical, chart, labs and discussed the procedure including the risks, benefits and alternatives for the proposed anesthesia with the patient or authorized representative who has indicated his/her understanding and acceptance.     Plan Discussed with: CRNA and Anesthesiologist  Anesthesia Plan Comments:         Anesthesia Quick Evaluation

## 2016-02-07 NOTE — Anesthesia Procedure Notes (Signed)
Procedure Name: Intubation Date/Time: 02/07/2016 8:44 AM Performed by: Anne Fu Pre-anesthesia Checklist: Patient identified, Emergency Drugs available, Suction available, Patient being monitored and Timeout performed Patient Re-evaluated:Patient Re-evaluated prior to inductionOxygen Delivery Method: Circle system utilized Preoxygenation: Pre-oxygenation with 100% oxygen Intubation Type: IV induction Ventilation: Mask ventilation without difficulty Laryngoscope Size: Mac and 4 Grade View: Grade I Tube type: Oral Tube size: 7.5 mm Number of attempts: 1 Airway Equipment and Method: Stylet Placement Confirmation: ETT inserted through vocal cords under direct vision,  positive ETCO2,  CO2 detector and breath sounds checked- equal and bilateral Secured at: 19 cm Tube secured with: Tape Dental Injury: Teeth and Oropharynx as per pre-operative assessment

## 2016-02-07 NOTE — H&P (Signed)
The patient is a 80 year old female who presents with non-malignant abdominal pain. 81 year old female who presents with vague abdominal pain and nausea on occasion. This has been occurring for almost a year. She also has a history of constipation and is using Colace for this currently. She was seen by Dr. Harlan Stains earlier this year and a CT scan was performed. This showed some mild diverticulitis and some nondependent air in the bladder. There was a concern for possible colovaginal fistula. The patient is a longtime patient of Dr. Kalman Shan and underwent a colonoscopy earlier this year, which shows no signs of malignancy. She denies any blood in her stools. She denies any history of frequent UTIs. She does report stool per vagina ~ every other week now.   Problem List/Past Medical  COLOVAGINAL FISTULA (N82.4) DIVERTICULITIS, COLON (K57.32)  Other Problems  Cerebrovascular Accident Back Pain Bladder Problems Atrial Fibrillation  Past Surgical History  Knee Surgery Right. Shoulder Surgery Left. Cataract Surgery Bilateral. Tonsillectomy  Diagnostic Studies History Colonoscopy 5-10 years ago Pap Smear >5 years ago  Allergies  Codeine and Related Nausea. Hydrocodone-Acetaminophen *ANALGESICS - OPIOID* Nausea, Vomiting. Morphine Derivatives Nausea. OxyCODONE HCl *ANALGESICS - OPIOID* Nausea.  Medication History  TraMADol HCl (50MG  Tablet, Oral) Active. Lyrica (150MG  Capsule, Oral) Active. Amiodarone HCl (200MG  Tablet, Oral) Active. Cyclobenzaprine HCl (5MG  Tablet, Oral) Active. DiltiaZEM HCl ER Coated Beads (180MG  Capsule ER 24HR, Oral) Active. Dorzolamide HCl-Timolol Mal (22.3-6.8MG /ML Solution, Ophthalmic) Active. Doxycycline Hyclate (50MG  Capsule, Oral) Active. Eliquis (5MG  Tablet, Oral) Active. Fenofibrate (145MG  Tablet, Oral) Active. Fluticasone Propionate (50MCG/ACT Suspension, Nasal) Active. Omeprazole (40MG  Capsule DR, Oral)  Active. Ondansetron HCl (4MG  Tablet, Oral) Active. Promethazine HCl (25MG  Tablet, Oral) Active. Salagen (5MG  Tablet, Oral) Active. Ativan (1MG  Tablet, Oral) Active. Medications Reconciled Neomycin Sulfate (500MG  Tablet, 2 (two) Tablet Oral SEE NOTE, Taken starting 11/27/2015) Active. (TAKE TWO TABLETS AT 2 PM, 3 PM, AND 10 PM THE DAY PRIOR TO SURGERY) Flagyl (500MG  Tablet, 2 (two) Tablet Oral SEE NOTE, Taken starting 11/27/2015) Active. (Take at 2pm, 3pm, and 10pm the day prior to your colon operation)  Family History  Cancer Father.  Pregnancy / Birth History  Para 2 Maternal age 67-20 Age of menopause 23-55 Gravida 1    Review of Systems  General Not Present- Appetite Loss, Chills, Fatigue, Fever, Night Sweats, Weight Gain and Weight Loss. Skin Present- Non-Healing Wounds. Not Present- Change in Wart/Mole, Dryness, Hives, Jaundice, New Lesions, Rash and Ulcer. HEENT Present- Visual Disturbances. Not Present- Earache, Hearing Loss, Hoarseness, Nose Bleed, Oral Ulcers, Ringing in the Ears, Seasonal Allergies, Sinus Pain, Sore Throat, Wears glasses/contact lenses and Yellow Eyes. Respiratory Present- Snoring. Not Present- Bloody sputum, Chronic Cough, Difficulty Breathing and Wheezing. Breast Not Present- Breast Mass, Breast Pain, Nipple Discharge and Skin Changes. Gastrointestinal Present- Excessive gas. Not Present- Abdominal Pain, Bloating, Bloody Stool, Change in Bowel Habits, Chronic diarrhea, Constipation, Difficulty Swallowing, Gets full quickly at meals, Hemorrhoids, Indigestion, Nausea, Rectal Pain and Vomiting. Musculoskeletal Present- Back Pain and Joint Pain. Not Present- Joint Stiffness, Muscle Pain, Muscle Weakness and Swelling of Extremities. Neurological Present- Numbness, Tingling, Trouble walking and Weakness. Not Present- Decreased Memory, Fainting, Headaches, Seizures and Tremor. Hematology Present- Easy Bruising. Not Present- Excessive bleeding, Gland  problems, HIV and Persistent Infections.  Vitals   BP 109/71   Pulse 71   Temp 98.4 F (36.9 C) (Oral)   Resp 16   Ht 5\' 1"  (1.549 m)   Wt 81.2 kg (179 lb)   SpO2 94%  BMI 33.82 kg/m    Physical Exam General Mental Status-Alert. General Appearance-Not in acute distress. Build & Nutrition-Well nourished. Posture-Normal posture. Gait-Normal.  Head and Neck Head-normocephalic, atraumatic with no lesions or palpable masses. Trachea-midline.  Chest and Lung Exam Chest and lung exam reveals -on auscultation, normal breath sounds, no adventitious sounds and normal vocal resonance.  Cardiovascular Auscultation Rhythm - Irregular. Heart Sounds - Normal heart sounds.  Abdomen Inspection Inspection of the abdomen reveals - No Hernias. Palpation/Percussion Palpation and Percussion of the abdomen reveal - Soft and No Rigidity (guarding). Tenderness - Note: suprapubic tenderness noted.  Neurologic Neurologic evaluation reveals -alert and oriented x 3 with no impairment of recent or remote memory, normal attention span and ability to concentrate, normal sensation and normal coordination.  Musculoskeletal Normal Exam - Bilateral-Upper Extremity Strength Normal and Lower Extremity Strength Normal.    Assessment & Plan COLOVAGINAL FISTULA (N82.4) Impression: 80 year old female with multiple medical problems who appears to have a progressing colovaginal fistula. She has underwent a colonoscopy which shows no signs of malignancy causing the fistula. We will evaluate the possibility of undergoing a robotic colon surgery for removal of her diseased portion of colon.  I have discussed this with her cardiologist and he feels she is moderate risk for surgery.  A rectal contrast CT scan identified the location of the fistula to the distal sigmoid. The surgery and anatomy were described to the patient as well as the risks of surgery and the possible  complications. These include: Bleeding, deep abdominal infections and possible wound complications such as hernia and infection, damage to adjacent structures, leak of surgical connections, which can lead to other surgeries and possibly an ostomy, possible need for other procedures, such as abscess drains in radiology, possible prolonged hospital stay, possible diarrhea from removal of part of the colon, possible constipation from narcotics, possible bowel, bladder or sexual dysfunction if having rectal surgery, prolonged fatigue/weakness or appetite loss, possible early recurrence of of disease, possible complications of their medical problems such as heart disease or arrhythmias or lung problems, death (less than 1%). I believe the patient understands and wishes to proceed with the surgery.

## 2016-02-07 NOTE — Op Note (Signed)
02/07/2016  12:14 PM  PATIENT:  Krystal Clarke  80 y.o. female  Patient Care Team: Harlan Stains, MD as PCP - General (Family Medicine)  PRE-OPERATIVE DIAGNOSIS:  colovesical fistula  POST-OPERATIVE DIAGNOSIS:  COLOVESICAL FISTULA  PROCEDURE: XI ROBOTIC ASSISTED SIGMOIDECTOMY WITH CLOSURE OF COLOVESICAL FISTULA   Surgeon(s): Leighton Ruff, MD Michael Boston, MD  ASSISTANT: Dr Johney Maine   ANESTHESIA:   local and general  EBL: 137ml Total I/O In: 1000 [I.V.:1000] Out: 350 [Urine:200; Blood:150]  Delay start of Pharmacological VTE agent (>24hrs) due to surgical blood loss or risk of bleeding:  no  DRAINS: (10F) Jackson-Pratt drain(s) with closed bulb suction in the pelvis   SPECIMEN:  Source of Specimen:  Sigmoid colon  DISPOSITION OF SPECIMEN:  PATHOLOGY  COUNTS:  YES  PLAN OF CARE: Admit to inpatient   PATIENT DISPOSITION:  PACU - hemodynamically stable.  INDICATION:    Pt with recurrent UTI's.  CT demonstrated colovesical fistula.  I recommended segmental resection:  The anatomy & physiology of the digestive tract was discussed.  The pathophysiology was discussed.  Natural history risks without surgery was discussed.   I worked to give an overview of the disease and the frequent need to have multispecialty involvement.  I feel the risks of no intervention will lead to serious problems that outweigh the operative risks; therefore, I recommended a partial colectomy to remove the pathology.  Laparoscopic & open techniques were discussed.   Risks such as bleeding, infection, abscess, leak, reoperation, possible ostomy, hernia, heart attack, death, and other risks were discussed.  I noted a good likelihood this will help address the problem.   Goals of post-operative recovery were discussed as well.    The patient expressed understanding & wished to proceed with surgery.  OR FINDINGS:   Patient had colovesical fistula to the L side of the bladder.    The anastomosis  rests 15 cm from the anal verge by rigid proctoscopy.  DESCRIPTION:   Informed consent was confirmed.  The patient underwent general anaesthesia without difficulty.  The patient was positioned appropriately.  VTE prevention in place.  The patient's abdomen was clipped, prepped, & draped in a sterile fashion.  Surgical timeout confirmed our plan.  The patient was positioned in reverse Trendelenburg.  Abdominal entry was gained using a Varies needle in the LUQ.  Entry was clean.  I induced carbon dioxide insufflation.  An 17mm port was placed in the RUQ.  Camera inspection revealed no injury.  Extra ports were carefully placed under direct laparoscopic visualization.  I bluntly mobilized the omentum off of the abdominal wall. It was densely adherent to the umbilicus. There was no underlying hernia noted. The omentum was then freed from the sigmoid colon.  I reflected the greater omentum and the upper abdomen the small bowel in the upper abdomen. The patient was placed in Trendelenburg position with right side down. The robot was undocked to the patient's left side and isthmus were placed under direct visualization.  I began by mobilizing the sigmoid colon off the anterior peritoneum using a combination of sharp and blunt dissection. I freed off the right side of the sigmoid colon using this mechanism as well. I was able to enter into the pelvis and probably sigmoid out. This left knee fistula site adherent to the left abdominal wall and left side of the bladder. I mobilized the sigmoid colon from the lateral sidewall using blunt dissection. I came down and mobilizing mesentery enough to identify the ureter.  This was then freed from the colonic mesentery using blunt dissection. Once I noted the ureter was free. I divided the fistula tract with Bovie electrocautery and scissors. This freed the colon from the bladder.  I then had the operating room nurse instill methylene blue tinted saline into the patient's  bladder. A leak was identified at the fistula site. I had one of the urologist take a look at the area. He did not feel that it was close to the ureteral orifice. I then closed the fistula site using a 3-0 V lock suture. I closed peritoneum over this using a 2-0 V lock suture.  Since the colon was freed from the left and right sidewalls I was able to bluntly enter the mesentery and began to divide down to the rectosigmoid junction using the robotic vessel sealer. I identified a portion of normal: Proximal to the inflammation and divided the mesentery close to the colon wall to avoid any injury to retroperitoneal structures. Once the entire mesentery was free, I divided the mesorectum using the robotic vessel sealer. The rectosigmoid junction was then divided using a blue load robotic stapler.  The abdomen was then irrigated.  I then enlarged my right lower quadrant port site and placed an [place] wound protector. The colon was brought out through this wound protector and divided at the area of previous mesenteric dissection. A pursestring device was placed over the colon and a 2-0 Prolene pursestring was performed. The colon was divided and sent off the field. There was good bleeding noted at the transected area. The pursestring was secured with 3-0 silk sutures. A 33 mm EEA anvil was then placed into the colon and the pursestring was tied tightly around this.  This was then placed back into the abdomen and the remainder of the colon was sent to pathology for further examination.  We then created an anastomosis under laparoscopic visualization.   There was no tension. There was no leak when tested with insufflation under water. A 19 [place] Blake drain was placed along the left pelvic sidewall and brought out through the right lower quadrant port site. This was secured into place with a nylon suture. After confirming good hemostasis and inspecting all 4 quadrants of the abdomen, the ports were removed and the  omentum was then placed back over the small bowel.  The peritoneum of the extraction site was closed with a running 2-0 Vicryl suture. The anterior fascial layer was closed using interrupted 0 Novafil sutures. The subcutaneous tissue was reapproximated using 2-0 Vicryl sutures. The skin was closed with a running 4-0 Vicryl suture. The port sites were also closed with 4-0 Vicryl suture and skin glue. A dressing was placed over the extraction site. The patient was then awakened from anesthesia and sent to the post anesthesia care unit in stable condition. All counts were correct per operating room staff.

## 2016-02-07 NOTE — Anesthesia Postprocedure Evaluation (Signed)
Anesthesia Post Note  Patient: SHIRIN DEMKE  Procedure(s) Performed: Procedure(s) (LRB): XI ROBOTIC ASSISTED SIGMOIDECTOMY WITH CLOSURE OF COLOVESICAL FISTULA (N/A)  Patient location during evaluation: PACU Anesthesia Type: General Level of consciousness: awake and alert Pain management: pain level controlled Vital Signs Assessment: post-procedure vital signs reviewed and stable Respiratory status: spontaneous breathing, nonlabored ventilation, respiratory function stable and patient connected to nasal cannula oxygen Cardiovascular status: blood pressure returned to baseline and stable Postop Assessment: no signs of nausea or vomiting Anesthetic complications: no    Last Vitals:  Vitals:   02/07/16 1458 02/07/16 1602  BP: (!) 109/52 (!) 107/52  Pulse: 68 67  Resp: 16 16  Temp: 36.4 C 36.6 C    Last Pain:  Vitals:   02/07/16 1602  TempSrc: Axillary  PainSc:                  Mabelle Mungin,JAMES TERRILL

## 2016-02-08 ENCOUNTER — Encounter (HOSPITAL_COMMUNITY): Payer: Self-pay | Admitting: Surgery

## 2016-02-08 DIAGNOSIS — L900 Lichen sclerosus et atrophicus: Secondary | ICD-10-CM | POA: Insufficient documentation

## 2016-02-08 DIAGNOSIS — E669 Obesity, unspecified: Secondary | ICD-10-CM

## 2016-02-08 LAB — CBC
HCT: 31.1 % — ABNORMAL LOW (ref 36.0–46.0)
HEMOGLOBIN: 9.8 g/dL — AB (ref 12.0–15.0)
MCH: 24.7 pg — ABNORMAL LOW (ref 26.0–34.0)
MCHC: 31.5 g/dL (ref 30.0–36.0)
MCV: 78.3 fL (ref 78.0–100.0)
PLATELETS: 234 10*3/uL (ref 150–400)
RBC: 3.97 MIL/uL (ref 3.87–5.11)
RDW: 17.4 % — ABNORMAL HIGH (ref 11.5–15.5)
WBC: 17.5 10*3/uL — AB (ref 4.0–10.5)

## 2016-02-08 LAB — BASIC METABOLIC PANEL
ANION GAP: 5 (ref 5–15)
BUN: 18 mg/dL (ref 6–20)
CALCIUM: 9.1 mg/dL (ref 8.9–10.3)
CO2: 26 mmol/L (ref 22–32)
CREATININE: 0.89 mg/dL (ref 0.44–1.00)
Chloride: 100 mmol/L — ABNORMAL LOW (ref 101–111)
Glucose, Bld: 147 mg/dL — ABNORMAL HIGH (ref 65–99)
Potassium: 4 mmol/L (ref 3.5–5.1)
SODIUM: 131 mmol/L — AB (ref 135–145)

## 2016-02-08 MED ORDER — ACETAMINOPHEN 500 MG PO TABS
1000.0000 mg | ORAL_TABLET | Freq: Three times a day (TID) | ORAL | Status: DC
  Administered 2016-02-08 – 2016-02-13 (×16): 1000 mg via ORAL
  Filled 2016-02-08 (×16): qty 2

## 2016-02-08 MED ORDER — SODIUM CHLORIDE 0.9% FLUSH: 3.0000 mL | INTRAVENOUS | Status: DC | PRN

## 2016-02-08 MED ORDER — METOPROLOL TARTRATE 5 MG/5ML IV SOLN: 5.0000 mg | Freq: Four times a day (QID) | INTRAVENOUS | Status: DC | PRN

## 2016-02-08 MED ORDER — ALBUTEROL SULFATE (2.5 MG/3ML) 0.083% IN NEBU: 2.5000 mg | INHALATION_SOLUTION | Freq: Four times a day (QID) | RESPIRATORY_TRACT | Status: DC | PRN

## 2016-02-08 MED ORDER — PROCHLORPERAZINE EDISYLATE 5 MG/ML IJ SOLN: 5.0000 mg | INTRAMUSCULAR | Status: DC | PRN

## 2016-02-08 MED ORDER — LIP MEDEX EX OINT
1.0000 "application " | TOPICAL_OINTMENT | Freq: Two times a day (BID) | CUTANEOUS | Status: DC
  Administered 2016-02-08 – 2016-02-13 (×11): 1 via TOPICAL
  Filled 2016-02-08: qty 7

## 2016-02-08 MED ORDER — ASPIRIN-ACETAMINOPHEN-CAFFEINE 250-250-65 MG PO TABS
1.0000 | ORAL_TABLET | ORAL | Status: DC | PRN
  Filled 2016-02-08: qty 1

## 2016-02-08 MED ORDER — FUROSEMIDE 10 MG/ML IJ SOLN
40.0000 mg | Freq: Once | INTRAMUSCULAR | Status: AC
  Administered 2016-02-08: 40 mg via INTRAVENOUS
  Filled 2016-02-08: qty 4

## 2016-02-08 MED ORDER — PREGABALIN 75 MG PO CAPS
150.0000 mg | ORAL_CAPSULE | Freq: Every morning | ORAL | Status: DC
  Administered 2016-02-09 – 2016-02-13 (×5): 150 mg via ORAL
  Filled 2016-02-08 (×5): qty 2

## 2016-02-08 MED ORDER — LACTATED RINGERS IV BOLUS (SEPSIS): 1000.0000 mL | Freq: Three times a day (TID) | INTRAVENOUS | Status: AC | PRN

## 2016-02-08 MED ORDER — SODIUM CHLORIDE 0.9% FLUSH
3.0000 mL | Freq: Two times a day (BID) | INTRAVENOUS | Status: DC
  Administered 2016-02-08 – 2016-02-12 (×4): 3 mL via INTRAVENOUS

## 2016-02-08 MED ORDER — SODIUM CHLORIDE 0.9% FLUSH
3.0000 mL | Freq: Two times a day (BID) | INTRAVENOUS | Status: DC
  Administered 2016-02-08 – 2016-02-09 (×3): 3 mL via INTRAVENOUS

## 2016-02-08 MED ORDER — SODIUM CHLORIDE 0.9 % IV SOLN: 250.0000 mL | INTRAVENOUS | Status: DC | PRN

## 2016-02-08 MED ORDER — METOCLOPRAMIDE HCL 5 MG/ML IJ SOLN: 5.0000 mg | Freq: Four times a day (QID) | INTRAMUSCULAR | Status: DC | PRN

## 2016-02-08 MED ORDER — PHENOL 1.4 % MT LIQD: 2.0000 | OROMUCOSAL | Status: DC | PRN

## 2016-02-08 MED ORDER — ENSURE ENLIVE PO LIQD
237.0000 mL | Freq: Two times a day (BID) | ORAL | Status: DC
Start: 2016-02-08 — End: 2016-02-14
  Administered 2016-02-08 – 2016-02-12 (×9): 237 mL via ORAL

## 2016-02-08 MED ORDER — LACTATED RINGERS IV BOLUS (SEPSIS): 1000.0000 mL | Freq: Three times a day (TID) | INTRAVENOUS | Status: DC | PRN

## 2016-02-08 MED ORDER — METHOCARBAMOL 1000 MG/10ML IJ SOLN
1000.0000 mg | Freq: Four times a day (QID) | INTRAMUSCULAR | Status: DC | PRN
  Filled 2016-02-08: qty 10

## 2016-02-08 MED ORDER — MAGIC MOUTHWASH
15.0000 mL | Freq: Four times a day (QID) | ORAL | Status: DC | PRN
  Filled 2016-02-08: qty 15

## 2016-02-08 MED ORDER — MENTHOL 3 MG MT LOZG
1.0000 | LOZENGE | OROMUCOSAL | Status: DC | PRN
  Administered 2016-02-10: 3 mg via ORAL
  Filled 2016-02-08: qty 9

## 2016-02-08 NOTE — Progress Notes (Signed)
Low urinary output from catheter, only 50ccs from 1900 to 0000.  Had pt stand at bedside to see if any urine was not draining correctly. No additional urine drained. Used bladder scanner to rule out occlusion, bladder scanner detected 63ccs. MD on call notified. Orders to give 500cc bolus of NS. Will continue to monitor.   Roselind Rily

## 2016-02-08 NOTE — Progress Notes (Signed)
An additional 50ccs drained from catheter. Stood pt again at bedside. Pt requiring 3L of O2 to maintain 93-94% saturation. Paged MD on call. Told to watch pt and give bolus more time. Pt at high risk for fluid overload.  Will continue to monitor.  Roselind Rily

## 2016-02-08 NOTE — Evaluation (Signed)
Physical Therapy Evaluation Patient Details Name: Krystal Clarke MRN: [id number] DOB: [birth year] Today's Date: 02/08/2016   History of Present Illness  Pt s/p sigmoidectomy with closure of colovesical fistula.  Pt with hx of CVA, Sjogrens Syndrome, bilat LE neuropathy, DM, legal blindness and CVA  Clinical Impression  Pt admitted as above and presenting with functional mobility limitations 2* premorbid deconditioning, obesity, post op pain, generalized weakness and balance deficits.  Pt hopes to progress to dc home with family assist and continuation of previous HHPT but is aware that follow up rehab at SNF level may prove to be safer option dependent on acute stay progress.    Follow Up Recommendations SNF    Equipment Recommendations  None recommended by PT    Recommendations for Other Services OT consult     Precautions / Restrictions Precautions Precautions: Fall Precaution Comments: Noted knees buckling intermittently with ambulation; JP drain on R Restrictions Weight Bearing Restrictions: No      Mobility  Bed Mobility Overal bed mobility: Needs Assistance;+2 for physical assistance;+ 2 for safety/equipment Bed Mobility: Rolling;Sidelying to Sit Rolling: Mod assist;+2 for physical assistance;+2 for safety/equipment Sidelying to sit: Mod assist;+2 for physical assistance;+2 for safety/equipment       General bed mobility comments: Increased time, multimodal cues for log roll technique and physical assist to manage LEs and bring trunk to upright  Transfers Overall transfer level: Needs assistance Equipment used: Rolling walker (2 wheeled) Transfers: Sit to/from Stand Sit to Stand: Mod assist;+2 physical assistance;+2 safety/equipment;From elevated surface         General transfer comment: cues for transition position and use of UEs to self assist.  Physical assist to bring wt up and fwd, to balance in inital standing with RW and to maintain upright position with  several episodes of knee buckling  Ambulation/Gait Ambulation/Gait assistance: Min assist;Mod assist;+2 physical assistance;+2 safety/equipment Ambulation Distance (Feet): 22 Feet Assistive device: Rolling walker (2 wheeled) Gait Pattern/deviations: Decreased step length - right;Decreased step length - left;Step-to pattern;Shuffle;Trunk flexed Gait velocity: decr Gait velocity interpretation: Below normal speed for age/gender General Gait Details: Increased time and cues for posture and position from [employer]            Wheelchair Mobility    Modified Rankin (Stroke Patients Only)       Balance Overall balance assessment: Needs assistance Sitting-balance support: Feet supported;Bilateral upper extremity supported Sitting balance-Leahy Scale: Fair     Standing balance support: Bilateral upper extremity supported Standing balance-Leahy Scale: Poor                               Pertinent Vitals/Pain Pain Assessment: 0-10 Pain Score: 6  Pain Location: abdominal area Pain Descriptors / Indicators: Grimacing;Moaning;Sore Pain Intervention(s): Limited activity within patient's tolerance;Monitored during session;Patient requesting pain meds-RN notified    Home Living Family/patient expects to be discharged to:: Private residence Living Arrangements: Spouse/significant other Available Help at Discharge: Family Type of Home: House Home Access: Stairs to enter   [employer] of Steps: 1 Home Layout: [place]: Environmental consultant - 2 wheels;Walker - 4 wheels;Wheelchair - manual      Prior Function Level of Independence: Needs assistance   Gait / Transfers Assistance Needed: Limited ambulation with use of AD.  Prior to admit, pt with HHPT addressing balance, gait, strength, endurance  ADL's / Homemaking Assistance Needed: Assist of spouse        Hand Dominance  Extremity/Trunk Assessment   Upper Extremity Assessment:  Generalized weakness           Lower Extremity Assessment: Generalized weakness      Cervical / Trunk Assessment: Kyphotic  Communication   Communication: No difficulties  Cognition Arousal/Alertness: Awake/alert Behavior During Therapy: WFL for tasks assessed/performed Overall Cognitive Status: Within Functional Limits for tasks assessed                      General Comments      Exercises     Assessment/Plan    PT Assessment Patient needs continued PT services  PT Problem List Decreased strength;Decreased activity tolerance;Decreased balance;Decreased mobility;Decreased knowledge of use of DME;Decreased safety awareness;Obesity;Pain          PT Treatment Interventions DME instruction;Gait training;Stair training;Functional mobility training;Therapeutic activities;Therapeutic exercise;Patient/family education    PT Goals (Current goals can be found in the Care Plan section)  Acute Rehab PT Goals Patient Stated Goal: Regain IND and dc to home rather than rehab setting PT Goal Formulation: With patient Time For Goal Achievement: 02/22/16 Potential to Achieve Goals: Fair    Frequency Min 3X/week   Barriers to discharge        Co-evaluation               End of Session Equipment Utilized During Treatment: Oxygen Activity Tolerance: Patient limited by fatigue;Patient limited by pain Patient left: in chair;with call bell/phone within reach;with family/visitor present Nurse Communication: Mobility status         Time: 1333-1405 PT Time Calculation (min) (ACUTE ONLY): 32 min   Charges:   PT Evaluation $PT Eval Low Complexity: 1 Procedure PT Treatments $Gait Training: 8-22 mins   PT G Codes:        Jodene Polyak 02-11-2016, 3:55 PM

## 2016-02-08 NOTE — Progress Notes (Signed)
[facility]  [address] [place], [place] [address] Phone: [phone] FAX: [place] [phone] [birth year]  CARE TEAM:  PCP: Vidal Schwalbe, MD  Outpatient Care Team: Patient Care Team: Harlan Stains, MD as PCP - General (Family Medicine)  Inpatient Treatment Team: Treatment Team: Attending Provider: Leighton Ruff, MD; Registered Nurse: Mertha Baars, RN; Technician: Resa Miner Spaugh, NT; Physical Therapist: Mathis Fare, PT; Technician: Fransico Him, NT  Problem List:   Principal Problem:   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 02/07/2016 Active Problems:   HYPERTRIGLYCERIDEMIA   Anxiety state   Essential hypertension   GERD   Sleep apnea   Hereditary and idiopathic peripheral neuropathy   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 ([place])   Sjogren's syndrome ([place])   Atrial fibrillation ([place])   Seizure disorder ([place])   B12 deficiency   Elevated white blood cell count   Obesity (BMI 30-39.9)   1 Day Post-Op  02/07/2016  POST-OPERATIVE DIAGNOSIS:  COLOVESICAL FISTULA  PROCEDURE: XI ROBOTIC ASSISTED SIGMOIDECTOMY WITH CLOSURE OF COLOVESICAL FISTULA   Surgeon(s): Leighton Ruff, MD Michael Boston, MD    Assessment  OK  Plan:  -diuresis w IVF PRN -clears, adv to fulls as tolerated per protocol -pain control -Foley x 7d minimum given colovesical fistula repair of inflamed bladder -chronic pain control -HTN control -VTE prophylaxis- SCDs, etc -mobilize as tolerated to help recovery  Adin Hector, M.D., F.A.C.S. Gastrointestinal and Minimally Invasive Surgery [facility] [address], [place], [place] [address] [phone] Main / Paging   02/08/2016  Subjective:  Dropped BP w Dilaudid x 1 - better now Family at bedside -no n/v  Objective:  Vital signs:  Vitals:   02/08/16 0151 02/08/16 0505 02/08/16 0730 02/08/16 1021  BP: (!) 98/49 (!) 97/48 104/61 127/87  Pulse: 99 96 67 91  Resp: 14 14  16   Temp: 97.5 F (36.4 C) 97.6 F (36.4 C)  98.3 F (36.8 C)  TempSrc:  Oral  Oral  SpO2: 93% 97% 91% 94%  Weight:      Height:        Last BM Date: 02/06/16  Intake/Output   Yesterday:  11/22 0701 - 11/23 0700 In: 2601.3 [P.O.:120; I.V.:2431.3; IV Piggyback:50] Out: 1070 [Urine:575; Drains:345; Blood:150] This shift:  Total I/O In: 120 [P.O.:120] Out: 430 [Urine:400; Drains:30]  Bowel function:  Flatus: No  BM:  No  Drain: (No drain)   Physical Exam:  General: Pt awake/alert/oriented x4 in No acute distress Eyes: PERRL, normal EOM.  Sclera clear.  No icterus Neuro: CN II-XII intact w/o focal sensory/motor deficits. Lymph: No head/neck/groin lymphadenopathy Psych:  No delerium/psychosis/paranoia HENT: Normocephalic, Mucus membranes moist.  No thrush Neck: Supple, No tracheal deviation Chest: No chest wall pain w good excursion CV:  Pulses intact.  Regular rhythm MS: Normal AROM mjr joints.  No obvious deformity Abdomen: Soft.  Mildy distended.  Mildly tender at incisions only.  No evidence of peritonitis.  No incarcerated hernias. Ext:  SCDs BLE.  No mjr edema.  No cyanosis Skin: No petechiae / purpura  Results:   Labs: Results for orders placed or performed during the hospital encounter of 02/07/16 (from the past 48 hour(s))  Glucose, capillary     Status: Abnormal   Collection Time: 02/07/16  6:22 AM  Result Value Ref Range   Glucose-Capillary 105 (H) 65 - 99 mg/dL   Comment  1 Notify RN   PT- INR Day of Surgery     Status: None   Collection Time: 02/07/16  7:11 AM  Result Value Ref Range   Prothrombin Time 12.9 11.4 - 15.2 seconds   INR 0.97   Glucose, capillary     Status: Abnormal   Collection Time: 02/07/16 12:29 PM  Result Value Ref Range   Glucose-Capillary 126 (H) 65 - 99 mg/dL   Comment 1 Notify RN    Comment 2  Document in Chart   Basic metabolic panel     Status: Abnormal   Collection Time: 02/08/16  5:10 AM  Result Value Ref Range   Sodium 131 (L) 135 - 145 mmol/L   Potassium 4.0 3.5 - 5.1 mmol/L   Chloride 100 (L) 101 - 111 mmol/L   CO2 26 22 - 32 mmol/L   Glucose, Bld 147 (H) 65 - 99 mg/dL   BUN 18 6 - 20 mg/dL   Creatinine, Ser 0.89 0.44 - 1.00 mg/dL   Calcium 9.1 8.9 - 10.3 mg/dL   GFR calc non Af Amer >60 >60 mL/min   GFR calc Af Amer >60 >60 mL/min    Comment: (NOTE) The eGFR has been calculated using the CKD EPI equation. This calculation has not been validated in all clinical situations. eGFR's persistently <60 mL/min signify possible Chronic Kidney Disease.    Anion gap 5 5 - 15  CBC     Status: Abnormal   Collection Time: 02/08/16  5:10 AM  Result Value Ref Range   WBC 17.5 (H) 4.0 - 10.5 K/uL   RBC 3.97 3.87 - 5.11 MIL/uL   Hemoglobin 9.8 (L) 12.0 - 15.0 g/dL   HCT 31.1 (L) 36.0 - 46.0 %   MCV 78.3 78.0 - 100.0 fL   MCH 24.7 (L) 26.0 - 34.0 pg   MCHC 31.5 30.0 - 36.0 g/dL   RDW 17.4 (H) 11.5 - 15.5 %   Platelets 234 150 - 400 K/uL    Imaging / Studies: No results found.  Medications / Allergies: per chart  Antibiotics: Anti-infectives    Start     Dose/Rate Route Frequency Ordered Stop   02/07/16 2100  cefoTEtan (CEFOTAN) 2 g in dextrose 5 % 50 mL IVPB     2 g 100 mL/hr over 30 Minutes Intravenous Every 12 hours 02/07/16 1509 02/07/16 2213   02/07/16 0618  cefoTEtan (CEFOTAN) 2 g in dextrose 5 % 50 mL IVPB     2 g 100 mL/hr over 30 Minutes Intravenous On call to O.R. 02/07/16 0618 02/07/16 0912        Note: Portions of this report may have been transcribed using voice recognition software. Every effort was made to ensure accuracy; however, inadvertent computerized transcription errors may be present.   Any transcriptional errors that result from this process are unintentional.     Adin Hector, M.D., F.A.C.S. Gastrointestinal and Minimally  Invasive Surgery [facility] [address], [place], [place] [address] [phone] Main / Paging   02/08/2016

## 2016-02-09 LAB — BASIC METABOLIC PANEL
Anion gap: 5 (ref 5–15)
BUN: 13 mg/dL (ref 6–20)
CO2: 28 mmol/L (ref 22–32)
CREATININE: 0.64 mg/dL (ref 0.44–1.00)
Calcium: 9.2 mg/dL (ref 8.9–10.3)
Chloride: 95 mmol/L — ABNORMAL LOW (ref 101–111)
Glucose, Bld: 124 mg/dL — ABNORMAL HIGH (ref 65–99)
POTASSIUM: 3.8 mmol/L (ref 3.5–5.1)
SODIUM: 128 mmol/L — AB (ref 135–145)

## 2016-02-09 LAB — CBC
HCT: 29.9 % — ABNORMAL LOW (ref 36.0–46.0)
Hemoglobin: 9.3 g/dL — ABNORMAL LOW (ref 12.0–15.0)
MCH: 24.3 pg — ABNORMAL LOW (ref 26.0–34.0)
MCHC: 31.1 g/dL (ref 30.0–36.0)
MCV: 78.1 fL (ref 78.0–100.0)
PLATELETS: 225 10*3/uL (ref 150–400)
RBC: 3.83 MIL/uL — AB (ref 3.87–5.11)
RDW: 17.5 % — ABNORMAL HIGH (ref 11.5–15.5)
WBC: 13.7 10*3/uL — AB (ref 4.0–10.5)

## 2016-02-09 MED ORDER — FUROSEMIDE 20 MG PO TABS
40.0000 mg | ORAL_TABLET | Freq: Once | ORAL | Status: AC
  Administered 2016-02-09: 40 mg via ORAL
  Filled 2016-02-09: qty 2

## 2016-02-09 NOTE — Evaluation (Signed)
Occupational Therapy Evaluation Patient Details Name: CHEVON MARIER MRN: [id number] DOB: [birth year] Today's Date: 02/09/2016    History of Present Illness Pt s/p sigmoidectomy with closure of colovesical fistula.  Pt with hx of CVA, Sjogrens Syndrome, bilat LE neuropathy, DM, legal blindness and CVA   Clinical Impression   This 80 year old female was admitted for the above.  She will benefit from continued OT to increase independence and safety with adls/toilet transfers.  Pt stated that she performed adls by herself.  Goals in acute are for min A.  She currently needs +2 assistance for safety with transfers and sit to stand for adls    Follow Up Recommendations  SNF    Equipment Recommendations  3 in 1 bedside comode (if she doesn't have this)    Recommendations for Other Services       Precautions / Restrictions Precautions Precautions: Fall Precaution Comments: JP drain, R side Restrictions Weight Bearing Restrictions: No      Mobility Bed Mobility Overal bed mobility: Needs Assistance;+2 for physical assistance;+ 2 for safety/equipment Bed Mobility: Rolling;Sidelying to Sit Rolling: Mod assist;+2 for physical assistance;+2 for safety/equipment Sidelying to sit: Mod assist;+2 for physical assistance;+2 for safety/equipment       General bed mobility comments: Increased time, multimodal cues for log roll technique and physical assist to bring trunk to upright  Transfers Overall transfer level: Needs assistance Equipment used: Rolling walker (2 wheeled) Transfers: Sit to/from Stand Sit to Stand: Min assist;Mod assist;+2 physical assistance;+2 safety/equipment         General transfer comment: cues for transition position and use of UEs to self assist.  Physical assist to bring wt up and fwd, to balance in inital standing with RW and to maintain upright position     Balance Overall balance assessment: Needs assistance Sitting-balance support: No upper  extremity supported;Feet supported Sitting balance-Leahy Scale: Fair     Standing balance support: Bilateral upper extremity supported Standing balance-Leahy Scale: Poor                              ADL Overall ADL's : Needs assistance/impaired     Grooming: Set up;Sitting;Wash/dry face   Upper Body Bathing: Set up;Sitting   Lower Body Bathing: Moderate assistance;+2 for safety/equipment;+2 for physical assistance;Sit to/from stand   Upper Body Dressing : Minimal assistance;Sitting   Lower Body Dressing: Maximal assistance;+2 for safety/equipment;+2 for physical assistance;Sit to/from stand   Toilet Transfer: Minimal assistance;Moderate assistance;+2 for physical assistance;+2 for safety/equipment;Ambulation;RW (chair; pt with catheter)             General ADL Comments: pt states she used to cross legs for adls but would use her arms to assist legs.  Unable to do so at this time.  Pt on 2 1/2 liters of 02 while in bed.  Used 3 liters ambulating     Vision     Perception     Praxis      Pertinent Vitals/Pain Pain Assessment: Faces Faces Pain Scale: Hurts little more Pain Location: abdomen Pain Descriptors / Indicators: Sore Pain Intervention(s): Limited activity within patient's tolerance;Monitored during session;Premedicated before session;Repositioned     Hand Dominance     Extremity/Trunk Assessment Upper Extremity Assessment Upper Extremity Assessment: Generalized weakness;LUE deficits/detail (RUE) LUE Deficits / Details: only able to lift LUE approximately 20 degrees actively; painful AAROM           Communication Communication Communication: No difficulties  Cognition Arousal/Alertness: Awake/alert Behavior During Therapy: WFL for tasks assessed/performed Overall Cognitive Status: Within Functional Limits for tasks assessed                     General Comments       Exercises       Shoulder Instructions      Home  Living Family/patient expects to be discharged to:: Unsure Living Arrangements: Spouse/significant other Available Help at Discharge: Family                                    Prior Functioning/Environment Level of Independence: Needs assistance    ADL's / Homemaking Assistance Needed:  (pt states she was able to bathe and dress herself)            OT Problem List: Decreased strength;Decreased activity tolerance;Impaired balance (sitting and/or standing);Decreased knowledge of use of DME or AE;Cardiopulmonary status limiting activity;Pain;Impaired UE functional use   OT Treatment/Interventions: Self-care/ADL training;Therapeutic exercise;DME and/or AE instruction;Therapeutic activities;Patient/family education;Balance training    OT Goals(Current goals can be found in the care plan section) Acute Rehab OT Goals Patient Stated Goal: Regain IND and dc to home rather than rehab setting OT Goal Formulation: With patient Time For Goal Achievement: 02/23/16 Potential to Achieve Goals: Good ADL Goals Pt Will Perform Grooming: with min guard assist;standing Pt Will Perform Lower Body Bathing: with min assist;sit to/from stand;with adaptive equipment Pt Will Transfer to Toilet: with min assist;ambulating;bedside commode Pt Will Perform Toileting - Clothing Manipulation and hygiene: with min assist;sit to/from stand  OT Frequency: Min 2X/week   Barriers to D/C:            Co-evaluation   Reason for Co-Treatment: For patient/therapist safety PT goals addressed during session: Mobility/safety with mobility OT goals addressed during session: ADL's and self-care      End of Session    Activity Tolerance: Patient limited by fatigue Patient left: in chair;with call bell/phone within reach;with family/visitor present   Time: [id number] OT Time Calculation (min): 32 min Charges:  OT General Charges $OT Visit: 1 Procedure OT Evaluation $OT Eval Low Complexity: 1  Procedure G-Codes:    Jazz Biddy 2016-03-07, 1:16 PM Lesle Chris, OTR/L [phone] 03/07/2016

## 2016-02-09 NOTE — Progress Notes (Signed)
[facility]  [address] [place], [place] [address] Phone: [phone] FAX: [place] [phone] [birth year]  CARE TEAM:  PCP: Vidal Schwalbe, MD  Outpatient Care Team: Patient Care Team: Harlan Stains, MD as PCP - General (Family Medicine)  Inpatient Treatment Team: Treatment Team: Attending Provider: Leighton Ruff, MD; Registered Nurse: Mertha Baars, RN; Technician: Tenna Child, NT; Physical Therapist: Mathis Fare, PT; Occupational Therapist: Lesle Chris, OT; Registered Nurse: Joselyn Glassman, RN; Technician: Leda Quail, NT; Technician: Etheleen Sia, NT  Problem List:   Principal Problem:   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 02/07/2016 Active Problems:   HYPERTRIGLYCERIDEMIA   Anxiety state   Essential hypertension   GERD   Sleep apnea   Hereditary and idiopathic peripheral neuropathy   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 ([place])   Sjogren's syndrome ([place])   Atrial fibrillation ([place])   Seizure disorder ([place])   B12 deficiency   Elevated white blood cell count   Obesity (BMI 30-39.9)   2 Days Post-Op  02/07/2016  POST-OPERATIVE DIAGNOSIS:  COLOVESICAL FISTULA  PROCEDURE: XI ROBOTIC ASSISTED SIGMOIDECTOMY WITH CLOSURE OF COLOVESICAL FISTULA   Surgeon(s): Leighton Ruff, MD Michael Boston, MD    Assessment  OK  Plan:  -diuresis w IVF PRN -adv to fulls as tolerated per protocol -pain control.  Nylon it seems to be working so far -Foley x 7d minimum given colovesical fistula repair of inflamed bladder -chronic pain control -Chronic anxiety control -HTN control -VTE prophylaxis- SCDs, etc -mobilize as tolerated to help recovery -Disposition: Recommendation for skilled nursing facility so far.  See how she progresses but may need that at discharge  Adin Hector, M.D.,  F.A.C.S. Gastrointestinal and Minimally Invasive Surgery [facility] [address] [place] [address] [place], [place] [address] [phone] Main / Paging   02/09/2016  Subjective:  Walked in hallway.  Husband at bedside.  Tolerating clears.  Does not enjoy the flavor is much.  Wants something more.  Objective:  Vital signs:  Vitals:   02/08/16 1021 02/08/16 1327 02/08/16 2138 02/09/16 0652  BP: 127/87 115/67 120/63 122/70  Pulse: 91 84 86 78  Resp: _0 Temp: 98.3 F (36.8 C) 98.1 F (36.7 C) 98.2 F (36.8 C) 98 F (36.7 C)  TempSrc: Oral Oral Oral Oral  SpO2: 94% 90% 98% 95%  Weight:      Height:        Last BM Date: 02/06/16  Intake/Output   Yesterday:  11/23 0701 - 11/24 0700 In: 1997 [P.O.:1197; I.V.:800] Out: 2290 [Urine:2105; Drains:185] This shift:  No intake/output data recorded.  Bowel function:  Flatus: No  BM:  No  Drain: Serosanguinous   Physical Exam:  General: Pt awake/alert/oriented x4 in No acute distress Eyes: PERRL, normal EOM.  Sclera clear.  No icterus Neuro: CN II-XII intact w/o focal sensory/motor deficits. Lymph: No head/neck/groin lymphadenopathy Psych:  No delerium/psychosis/paranoia HENT: Normocephalic, Mucus membranes moist.  No thrush Neck: Supple, No tracheal deviation Chest: No chest wall pain w good excursion CV:  Pulses intact.  Regular rhythm MS: Normal AROM mjr joints.  No obvious deformity Abdomen: Morbidly obese but Soft.  Mildy distended.  Mildly tender at incisions only.    Dressings clean dry and intact.  No evidence of peritonitis.  No incarcerated hernias. Ext:  SCDs BLE.  No mjr edema.  No cyanosis Skin: No petechiae /  purpura  Results:   Labs: Results for orders placed or performed during the hospital encounter of 02/07/16 (from the past 48 hour(s))  Glucose, capillary     Status: Abnormal   Collection Time: 02/07/16 12:29 PM  Result Value Ref Range   Glucose-Capillary  126 (H) 65 - 99 mg/dL   Comment 1 Notify RN    Comment 2 Document in Chart   Basic metabolic panel     Status: Abnormal   Collection Time: 02/08/16  5:10 AM  Result Value Ref Range   Sodium 131 (L) 135 - 145 mmol/L   Potassium 4.0 3.5 - 5.1 mmol/L   Chloride 100 (L) 101 - 111 mmol/L   CO2 26 22 - 32 mmol/L   Glucose, Bld 147 (H) 65 - 99 mg/dL   BUN 18 6 - 20 mg/dL   Creatinine, Ser 0.89 0.44 - 1.00 mg/dL   Calcium 9.1 8.9 - 10.3 mg/dL   GFR calc non Af Amer >60 >60 mL/min   GFR calc Af Amer >60 >60 mL/min    Comment: (NOTE) The eGFR has been calculated using the CKD EPI equation. This calculation has not been validated in all clinical situations. eGFR's persistently <60 mL/min signify possible Chronic Kidney Disease.    Anion gap 5 5 - 15  CBC     Status: Abnormal   Collection Time: 02/08/16  5:10 AM  Result Value Ref Range   WBC 17.5 (H) 4.0 - 10.5 K/uL   RBC 3.97 3.87 - 5.11 MIL/uL   Hemoglobin 9.8 (L) 12.0 - 15.0 g/dL   HCT 31.1 (L) 36.0 - 46.0 %   MCV 78.3 78.0 - 100.0 fL   MCH 24.7 (L) 26.0 - 34.0 pg   MCHC 31.5 30.0 - 36.0 g/dL   RDW 17.4 (H) 11.5 - 15.5 %   Platelets 234 150 - 400 K/uL  Basic metabolic panel     Status: Abnormal   Collection Time: 02/09/16  5:03 AM  Result Value Ref Range   Sodium 128 (L) 135 - 145 mmol/L   Potassium 3.8 3.5 - 5.1 mmol/L   Chloride 95 (L) 101 - 111 mmol/L   CO2 28 22 - 32 mmol/L   Glucose, Bld 124 (H) 65 - 99 mg/dL   BUN 13 6 - 20 mg/dL   Creatinine, Ser 0.64 0.44 - 1.00 mg/dL   Calcium 9.2 8.9 - 10.3 mg/dL   GFR calc non Af Amer >60 >60 mL/min   GFR calc Af Amer >60 >60 mL/min    Comment: (NOTE) The eGFR has been calculated using the CKD EPI equation. This calculation has not been validated in all clinical situations. eGFR's persistently <60 mL/min signify possible Chronic Kidney Disease.    Anion gap 5 5 - 15  CBC     Status: Abnormal   Collection Time: 02/09/16  5:03 AM  Result Value Ref Range   WBC 13.7 (H) 4.0 -  10.5 K/uL   RBC 3.83 (L) 3.87 - 5.11 MIL/uL   Hemoglobin 9.3 (L) 12.0 - 15.0 g/dL   HCT 29.9 (L) 36.0 - 46.0 %   MCV 78.1 78.0 - 100.0 fL   MCH 24.3 (L) 26.0 - 34.0 pg   MCHC 31.1 30.0 - 36.0 g/dL   RDW 17.5 (H) 11.5 - 15.5 %   Platelets 225 150 - 400 K/uL    Imaging / Studies: No results found.  Medications / Allergies: per chart  Antibiotics: Anti-infectives    Start  Dose/Rate Route Frequency Ordered Stop   02/07/16 2100  cefoTEtan (CEFOTAN) 2 g in dextrose 5 % 50 mL IVPB     2 g 100 mL/hr over 30 Minutes Intravenous Every 12 hours 02/07/16 1509 02/07/16 2213   02/07/16 0618  cefoTEtan (CEFOTAN) 2 g in dextrose 5 % 50 mL IVPB     2 g 100 mL/hr over 30 Minutes Intravenous On call to O.R. 02/07/16 0618 02/07/16 0912        Note: Portions of this report may have been transcribed using voice recognition software. Every effort was made to ensure accuracy; however, inadvertent computerized transcription errors may be present.   Any transcriptional errors that result from this process are unintentional.     Adin Hector, M.D., F.A.C.S. Gastrointestinal and Minimally Invasive Surgery [facility] [address], [place], [place] [address] [phone] Main / Paging   02/09/2016

## 2016-02-09 NOTE — Progress Notes (Signed)
Physical Therapy Treatment Patient Details Name: Krystal Clarke MRN: [id number] DOB: [birth year] Today's Date: 02/09/2016    History of Present Illness Pt s/p sigmoidectomy with closure of colovesical fistula.  Pt with hx of CVA, Sjogrens Syndrome, bilat LE neuropathy, DM, legal blindness and CVA    PT Comments    Pt more motivated this am and progressing slowly with mobility.  Follow Up Recommendations  SNF     Equipment Recommendations  None recommended by PT    Recommendations for Other Services OT consult     Precautions / Restrictions Precautions Precautions: Fall Precaution Comments: Noted knees buckling intermittently with ambulation; JP drain on R Restrictions Weight Bearing Restrictions: No    Mobility  Bed Mobility Overal bed mobility: Needs Assistance;+2 for physical assistance;+ 2 for safety/equipment Bed Mobility: Rolling;Sidelying to Sit Rolling: Mod assist;+2 for physical assistance;+2 for safety/equipment Sidelying to sit: Mod assist;+2 for physical assistance;+2 for safety/equipment       General bed mobility comments: Increased time, multimodal cues for log roll technique and physical assist to bring trunk to upright  Transfers Overall transfer level: Needs assistance Equipment used: Rolling walker (2 wheeled) Transfers: Sit to/from Stand Sit to Stand: Min assist;Mod assist;+2 physical assistance;+2 safety/equipment         General transfer comment: cues for transition position and use of UEs to self assist.  Physical assist to bring wt up and fwd, to balance in inital standing with RW and to maintain upright position   Ambulation/Gait Ambulation/Gait assistance: Min assist;Mod assist;+2 safety/equipment Ambulation Distance (Feet): 22 Feet Assistive device: Rolling walker (2 wheeled) Gait Pattern/deviations: Step-to pattern;Step-through pattern;Decreased step length - right;Decreased step length - left;Shuffle;Trunk flexed Gait velocity:  decr Gait velocity interpretation: Below normal speed for age/gender General Gait Details: Increased time and cues for posture and position from [employer]            Wheelchair Mobility    Modified Rankin (Stroke Patients Only)       Balance Overall balance assessment: Needs assistance Sitting-balance support: No upper extremity supported;Feet supported Sitting balance-Leahy Scale: Fair     Standing balance support: Bilateral upper extremity supported Standing balance-Leahy Scale: Poor                      Cognition Arousal/Alertness: Awake/alert Behavior During Therapy: WFL for tasks assessed/performed Overall Cognitive Status: Within Functional Limits for tasks assessed                      Exercises      General Comments        Pertinent Vitals/Pain Pain Assessment: Faces Faces Pain Scale: Hurts little more Pain Location: abdomen Pain Descriptors / Indicators: Sore;Grimacing Pain Intervention(s): Limited activity within patient's tolerance;Monitored during session;Premedicated before session    Home Living                      Prior Function            PT Goals (current goals can now be found in the care plan section) Acute Rehab PT Goals Patient Stated Goal: Regain IND and dc to home rather than rehab setting PT Goal Formulation: With patient Time For Goal Achievement: 02/22/16 Potential to Achieve Goals: Fair Progress towards PT goals: Progressing toward goals    Frequency    Min 3X/week      PT Plan Current plan remains appropriate    Co-evaluation PT/OT/SLP Co-Evaluation/Treatment: Yes Reason  for Co-Treatment: For patient/therapist safety PT goals addressed during session: Mobility/safety with mobility OT goals addressed during session: ADL's and self-care     End of Session Equipment Utilized During Treatment: Oxygen Activity Tolerance: Patient limited by fatigue;Patient limited by pain Patient left:  in chair;with call bell/phone within reach;with family/visitor present     Time: [id number] PT Time Calculation (min) (ACUTE ONLY): 35 min  Charges:  $Gait Training: 8-22 mins                    G Codes:      Krystal Clarke 02-22-16, 12:59 PM

## 2016-02-09 NOTE — NC FL2 (Signed)
[place] LEVEL OF CARE SCREENING TOOL     IDENTIFICATION  Patient Name: Krystal Clarke Birthdate: 1935/03/26 Sex: female Admission Date (Current Location): 02/07/2016  [facility] and [place] Number:  Herbalist and Address:  [facility],  [place] [address], [place]      Provider Number: [id number]  Attending Physician Name and Address:  Leighton Ruff, MD  Relative Name and Phone Number:       Current Level of Care: Hospital Recommended Level of Care: [place] Prior Approval Number:    Date Approved/Denied:   PASRR Number:   [id number]  Discharge Plan: SNF    Current Diagnoses: Patient Active Problem List   Diagnosis Date Noted  . Obesity (BMI 30-39.9) 02/08/2016  . Lichen sclerosus 0000000  . 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 02/07/2016 02/07/2016  . Chest pain 11/04/2014  . Elevated white blood cell count 11/04/2014  . B12 deficiency 05/20/2013  . Seizure disorder ([place]) 12/07/2012  . Atrial fibrillation ([place]) 12/06/2012  . Sjogren's syndrome ([place])   . Hereditary and idiopathic peripheral neuropathy 05/09/2012  . 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 ([place]) 05/09/2012  . History of detached retina repair 02/24/2012  . Primary open angle glaucoma of both eyes, severe stage 02/24/2012  . ANEMIA, B12 DEFICIENCY 07/19/2009  . Anxiety state 01/27/2009  . Sleep apnea 12/19/2008  . MIXED HYPERLIPIDEMIA 07/18/2008  . HYPERTRIGLYCERIDEMIA 08/28/2006  . MIGRAINE HEADACHE 08/28/2006  . Essential hypertension 08/28/2006  . GERD 08/28/2006  . PSORIASIS 08/28/2006  . OSTEOARTHRITIS 08/28/2006  . OSTEOPOROSIS 08/28/2006    Orientation RESPIRATION BLADDER Height & Weight     Self, Time, Situation, Place  O2 (at 2.5 L's ) Incontinent, Indwelling catheter Weight: 179 lb (81.2 kg) Height:  5\' 1"  (154.9 cm)  BEHAVIORAL SYMPTOMS/MOOD NEUROLOGICAL BOWEL  NUTRITION STATUS   (none )  (none ) Continent Diet (Full Liquids )  AMBULATORY STATUS COMMUNICATION OF NEEDS Skin   Extensive Assist Verbally Surgical wounds                       Personal Care Assistance Level of Assistance  Dressing, Bathing, Feeding Bathing Assistance: Maximum assistance Feeding assistance: Limited assistance Dressing Assistance: Maximum assistance     Functional Limitations Info  Speech, Hearing, Sight Sight Info: Adequate Hearing Info: Adequate Speech Info: Adequate    SPECIAL CARE FACTORS FREQUENCY  PT (By licensed PT), OT (By licensed OT)     PT Frequency: 3 OT Frequency: 2            Contractures      Additional Factors Info  Code Status, Allergies Code Status Info: FULL CODE  Allergies Info: Rivaroxaban, Codeine, Hydrocodone, Morphine And Related, Oxycodone, Oxycodone-aspirin, Pilocarpine           Current Medications (02/09/2016):  This is the current hospital active medication list Current Facility-Administered Medications  Medication Dose Route Frequency Provider Last Rate Last Dose  . 0.9 %  sodium chloride infusion  250 mL Intravenous PRN Michael Boston, MD      . 0.9 %  sodium chloride infusion  250 mL Intravenous PRN Michael Boston, MD      . acetaminophen (TYLENOL) tablet 1,000 mg  1,000 mg Oral TID Michael Boston, MD   1,000 mg at 02/09/16 0807  . albuterol (PROVENTIL) (2.5 MG/3ML) 0.083% nebulizer solution 2.5 mg  2.5 mg Nebulization Q6H PRN Michael Boston, MD      .  alvimopan (ENTEREG) capsule 12 mg  12 mg Oral BID Leighton Ruff, MD   12 mg at 02/09/16 0807  . aspirin-acetaminophen-caffeine (EXCEDRIN MIGRAINE) per tablet 1 tablet  1 tablet Oral PRN Michael Boston, MD      . cyclobenzaprine (FLEXERIL) tablet 5 mg  5 mg Oral QHS Leighton Ruff, MD   5 mg at 02/08/16 2211  . diltiazem (CARDIZEM CD) 24 hr capsule 180 mg  99991111 mg Oral Daily Leighton Ruff, MD   [id number] mg at 02/09/16 [id number]  . diphenhydrAMINE (BENADRYL) 12.5 MG/5ML elixir 12.5 mg   12.5 mg Oral [id number] PRN Leighton Ruff, MD       Or  . diphenhydrAMINE (BENADRYL) injection 12.5 mg  12.5 mg Intravenous [id number] PRN Leighton Ruff, MD      . dorzolamide (TRUSOPT) 2 % ophthalmic solution 1 drop  1 drop Both Eyes BID Leighton Ruff, MD   1 drop at 02/09/16 0809  . enoxaparin (LOVENOX) injection 40 mg  40 mg Subcutaneous [id number] Leighton Ruff, MD   40 mg at 02/09/16 0807  . feeding supplement (ENSURE ENLIVE) (ENSURE ENLIVE) liquid 237 mL  237 mL Oral BID BM Michael Boston, MD   237 mL at 02/09/16 1400  . fenofibrate tablet 160 mg  0000000 mg Oral Daily Leighton Ruff, MD   0000000 mg at 02/09/16 [id number]  . HYDROmorphone (DILAUDID) injection 0.5 mg  0.5 mg Intravenous [id number] PRN Leighton Ruff, MD   0.5 mg at 02/09/16 0944  . lactated ringers bolus 1,000 mL  1,000 mL Intravenous Q8H PRN Michael Boston, MD      . lip balm (CARMEX) ointment 1 application  1 application Topical BID Michael Boston, MD   1 application at [id number]  . magic mouthwash  15 mL Oral QID PRN Michael Boston, MD      . menthol-cetylpyridinium (CEPACOL) lozenge 3 mg  1 lozenge Oral PRN Michael Boston, MD      . metFORMIN (GLUCOPHAGE-XR) 24 hr tablet 500 mg  500 mg Oral Q supper Leighton Ruff, MD   [id number] mg at 02/08/16 1647  . methocarbamol (ROBAXIN) 1,000 mg in dextrose 5 % 50 mL IVPB  1,000 mg Intravenous Q6H PRN Michael Boston, MD      . metoCLOPramide (REGLAN) injection 5-10 mg  5-10 mg Intravenous Q6H PRN Michael Boston, MD      . metoprolol (LOPRESSOR) injection 5 mg  5 mg Intravenous Q6H PRN Michael Boston, MD      . ondansetron [facility]) tablet 4 mg  4 mg Oral [id number] PRN Leighton Ruff, MD       Or  . ondansetron [facility]) injection 4 mg  4 mg Intravenous [id number] PRN Leighton Ruff, MD      . pantoprazole (PROTONIX) EC tablet 40 mg  40 mg Oral Daily Leighton Ruff, MD   40 mg at 02/09/16 0807  . phenol (CHLORASEPTIC) mouth spray 2 spray  2 spray Mouth/Throat PRN Michael Boston, MD      . pregabalin (LYRICA) capsule 150 mg  150 mg Oral q morning - 10a Michael Boston, MD   150 mg at 02/09/16 [id number]  . pregabalin (LYRICA) capsule 300 mg  [id number] mg Oral QHS Leighton Ruff, MD   [id number] mg at 02/08/16 2212  . prochlorperazine (COMPAZINE) injection 5-10 mg  5-10 mg Intravenous Q4H PRN Michael Boston, MD      . sodium chloride flush (NS) 0.9 % injection 3 mL  3 mL Intravenous Q12H Michael Boston, MD   3 mL  at 02/09/16 1000  . sodium chloride flush (NS) 0.9 % injection 3 mL  3 mL Intravenous PRN Michael Boston, MD      . sodium chloride flush (NS) 0.9 % injection 3 mL  3 mL Intravenous Q12H Michael Boston, MD   3 mL at 02/08/16 1430  . sodium chloride flush (NS) 0.9 % injection 3 mL  3 mL Intravenous PRN Michael Boston, MD      . timolol (TIMOPTIC) 0.5 % ophthalmic solution 1 drop  1 drop Both Eyes BID Leighton Ruff, MD   1 drop at 02/09/16 0809  . traZODone (DESYREL) tablet 25-50 mg  25-50 mg Oral QHS PRN Leighton Ruff, MD         Discharge Medications: Please see discharge summary for a list of discharge medications.  Relevant Imaging Results:  Relevant Lab Results:   Additional Information SSN [id number]  Glendon Axe A

## 2016-02-10 ENCOUNTER — Encounter (HOSPITAL_COMMUNITY): Payer: Self-pay | Admitting: Surgery

## 2016-02-10 DIAGNOSIS — E119 Type 2 diabetes mellitus without complications: Secondary | ICD-10-CM

## 2016-02-10 DIAGNOSIS — R0902 Hypoxemia: Secondary | ICD-10-CM

## 2016-02-10 LAB — GLUCOSE, CAPILLARY
GLUCOSE-CAPILLARY: 115 mg/dL — AB (ref 65–99)
Glucose-Capillary: 94 mg/dL (ref 65–99)
Glucose-Capillary: 96 mg/dL (ref 65–99)

## 2016-02-10 LAB — BASIC METABOLIC PANEL
ANION GAP: 6 (ref 5–15)
BUN: 12 mg/dL (ref 6–20)
CHLORIDE: 97 mmol/L — AB (ref 101–111)
CO2: 28 mmol/L (ref 22–32)
Calcium: 9.5 mg/dL (ref 8.9–10.3)
Creatinine, Ser: 0.58 mg/dL (ref 0.44–1.00)
GFR calc non Af Amer: >60 mL/min (ref >60)
Glucose, Bld: 118 mg/dL — ABNORMAL HIGH (ref 65–99)
Potassium: 4 mmol/L (ref 3.5–5.1)
Sodium: 131 mmol/L — ABNORMAL LOW (ref 135–145)

## 2016-02-10 LAB — CBC
HCT: 29 % — ABNORMAL LOW (ref 36.0–46.0)
HEMOGLOBIN: 9 g/dL — AB (ref 12.0–15.0)
MCH: 24.4 pg — AB (ref 26.0–34.0)
MCHC: 31 g/dL (ref 30.0–36.0)
MCV: 78.6 fL (ref 78.0–100.0)
Platelets: 238 10*3/uL (ref 150–400)
RBC: 3.69 MIL/uL — AB (ref 3.87–5.11)
RDW: 17.5 % — ABNORMAL HIGH (ref 11.5–15.5)
WBC: 12.1 10*3/uL — ABNORMAL HIGH (ref 4.0–10.5)

## 2016-02-10 LAB — CREATININE, FLUID (PLEURAL, PERITONEAL, JP DRAINAGE): Creat, Fluid: 0.5 mg/dL

## 2016-02-10 MED ORDER — ENOXAPARIN SODIUM 40 MG/0.4ML ~~LOC~~ SOLN: 40.0000 mg | SUBCUTANEOUS | Status: DC

## 2016-02-10 MED ORDER — INSULIN ASPART 100 UNIT/ML ~~LOC~~ SOLN
0.0000 [IU] | SUBCUTANEOUS | Status: DC
  Administered 2016-02-11 – 2016-02-12 (×3): 2 [IU] via SUBCUTANEOUS
  Administered 2016-02-12: 3 [IU] via SUBCUTANEOUS
  Administered 2016-02-12 – 2016-02-13 (×6): 2 [IU] via SUBCUTANEOUS

## 2016-02-10 MED ORDER — TRAMADOL HCL 50 MG PO TABS
50.0000 mg | ORAL_TABLET | Freq: Four times a day (QID) | ORAL | Status: DC | PRN
  Administered 2016-02-11 – 2016-02-12 (×2): 50 mg via ORAL
  Filled 2016-02-10 (×2): qty 1

## 2016-02-10 MED ORDER — HYDROMORPHONE HCL 1 MG/ML IJ SOLN: 0.5000 mg | INTRAMUSCULAR | Status: DC | PRN

## 2016-02-10 MED ORDER — CYCLOBENZAPRINE HCL 5 MG PO TABS: 5.0000 mg | ORAL_TABLET | Freq: Three times a day (TID) | ORAL | Status: DC | PRN

## 2016-02-10 MED ORDER — FUROSEMIDE 20 MG PO TABS
40.0000 mg | ORAL_TABLET | Freq: Every day | ORAL | Status: AC
  Administered 2016-02-10 – 2016-02-12 (×3): 40 mg via ORAL
  Filled 2016-02-10 (×3): qty 2

## 2016-02-10 MED ORDER — APIXABAN 5 MG PO TABS
5.0000 mg | ORAL_TABLET | Freq: Two times a day (BID) | ORAL | Status: DC
  Administered 2016-02-10 – 2016-02-13 (×6): 5 mg via ORAL
  Filled 2016-02-10 (×6): qty 1

## 2016-02-10 NOTE — Clinical Social Work Note (Signed)
Clinical Social Work Assessment  Patient Details  Name: Krystal Clarke MRN: [id number] Date of Birth: 1935/10/31  Date of referral:  02/10/16               Reason for consult:  Facility Placement, Discharge Planning                Permission sought to share information with:  Family Supports, Customer service manager, Case Optician, dispensing granted to share information::  Yes, Verbal Permission Granted  Name::      Krystal Clarke )  Agency::   (SNF's )  Relationship::   (Spouse )  Contact Information:   [phone])  Housing/Transportation Living arrangements for the past 2 months:  [place] of Information:  Patient, Spouse Patient Interpreter Needed:  None Criminal Activity/Legal Involvement Pertinent to Current Situation/Hospitalization:  No - Comment as needed Significant Relationships:  Spouse, Adult Children Lives with:  Spouse Do you feel safe going back to the place where you live?  No Need for family participation in patient care:  Yes (Comment)  Care giving concerns:  Patient admitted from home with husband and now requiring STR at skilled level.    Social Worker assessment / plan: MSW met with patient and spouse, Krystal Clarke at bedside in reference to post-acute placement for SNF. MSW introduced MSW role and SNF process. Patient's husband reported that they are familiar with [facility] which is closet to their home and would prefer placement there. Patient agreeable and also prefers [facility]. No further concerns reported at this time. FL2 completed and patient has bed at [facility] [place], facility notified of acceptance and prepared to admitted patient once stable. MSW will continue to follow pt and pt's family for continued support and to facilitate patient's discharge needs once medically stable.   Employment status:  Retired Forensic scientist:  Medicare PT Recommendations:  [address] / Referral to community resources:  [place]  Patient/Family's Response to care:  Patient a/o x4. Patient and husband agreeable to SNF placement at [facility]. Patient smiling, happy and in good spirits. Patient's husband supportive and strongly involved in pt's care.Pt and husband appreciated social work intervention.    Patient/Family's Understanding of and Emotional Response to Diagnosis, Current Treatment, and Prognosis:  Patient and husband understanding of medical interventions.   Emotional Assessment Appearance:  Appears stated age Attitude/Demeanor/Rapport:   (Pleasant ) Affect (typically observed):  Accepting, Calm, Happy, Pleasant Orientation:  Oriented to Situation, Oriented to  Time, Oriented to Place, Oriented to Self Alcohol / Substance use:  Not Applicable Psych involvement (Current and /or in the community):  No (Comment)  Discharge Needs  Concerns to be addressed:  Denies Needs/Concerns at this time Readmission within the last 30 days:  No Current discharge risk:  Dependent with Mobility Barriers to Discharge:  Continued Medical Work up   [employer], MSW [phone] 02/10/2016 1:35 PM

## 2016-02-10 NOTE — Progress Notes (Addendum)
[facility]  [address] [place], [place] [address] Phone: [phone] FAX: [place] [phone] 14-Apr-1935  CARE TEAM:  PCP: Vidal Schwalbe, MD  Outpatient Care Team: Patient Care Team: Harlan Stains, MD as PCP - General (Family Medicine)  Inpatient Treatment Team: Treatment Team: Attending Provider: Leighton Ruff, MD; Registered Nurse: Mertha Baars, RN; Technician: Tenna Child, NT; Registered Nurse: Joselyn Glassman, RN; Technician: Leda Quail, NT; Technician: Etheleen Sia, NT; Registered Nurse: Celedonio Savage, RN  Problem List:   Principal Problem:   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 02/07/2016 Active Problems:   HYPERTRIGLYCERIDEMIA   Anxiety state   Essential hypertension   GERD   Sleep apnea   Hereditary and idiopathic peripheral neuropathy   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 ([place])   Sjogren's syndrome ([place])   Atrial fibrillation ([place])   Seizure disorder ([place])   B12 deficiency   Elevated white blood cell count   Obesity (BMI 30-39.9)   Diabetes mellitus without complication ([place])   3 Days Post-Op  02/07/2016  POST-OPERATIVE DIAGNOSIS:  COLOVESICAL FISTULA  PROCEDURE: XI ROBOTIC ASSISTED SIGMOIDECTOMY WITH CLOSURE OF COLOVESICAL FISTULA   Surgeon(s): Leighton Ruff, MD Michael Boston, MD    Assessment  OK  Plan:  -adv to soft diet as tolerated per protocol -Afib - Hgb stable.  Restart Eliquis & follow -pain control.  -Foley x 7d minimum given colovesical fistula repair of inflamed bladder -Diuresis as tolerated -wean off O2 as tolerated -Chronic pain control -Chronic anxiety control -HTN control -DM control -VTE prophylaxis- SCDs, etc -mobilize as tolerated to help recovery - Get her up  -Disposition: Recommendation for skilled nursing facility so far.  Patient and husband are  hoping she can go home.  See how she progresses but may need that at discharge  Adin Hector, M.D., F.A.C.S. Gastrointestinal and Minimally Invasive Surgery [facility] [address], [place], [place] [address] [phone] Main / Paging   02/10/2016  Subjective:  Walked in hallway x2 but needs help Husband at bedside. Tolerating fulls.  Wants something more.  Objective:  Vital signs:  Vitals:   02/09/16 0652 02/09/16 1420 02/09/16 2117 02/10/16 0445  BP: 122/70 (!) 114/55 110/64 (!) 109/56  Pulse: 78 81 69 78  Resp: 16 16 18 16   Temp: 98 F (36.7 C) 97.4 F (36.3 C) 99.9 F (37.7 C) 98.7 F (37.1 C)  TempSrc: Oral Oral Axillary Oral  SpO2: 95% 97% 96% 95%  Weight:      Height:        Last BM Date: 02/09/16  Intake/Output   Yesterday:  11/24 0701 - 11/25 0700 In: 280 [P.O.:120; I.V.:140] Out: 2000 [Urine:1900; Drains:100] This shift:  Total I/O In: -  Out: 10 [Drains:10]  Bowel function:  Flatus: No  BM:  No  Drain: Serosanguinous   Physical Exam:  General: Pt awake/alert/oriented x4 in No acute distress Eyes: PERRL, normal EOM.  Sclera clear.  No icterus Neuro: CN II-XII intact w/o focal sensory/motor deficits. Lymph: No head/neck/groin lymphadenopathy Psych:  No delerium/psychosis/paranoia HENT: Normocephalic, Mucus membranes moist.  No thrush Neck: Supple, No tracheal deviation Chest: No chest wall pain w good excursion CV:  Pulses intact.  Regular rhythm MS: Normal AROM mjr joints.  No obvious deformity Abdomen: Morbidly obese but Soft.  Nondistended.  Mildly tender at incisions only.    Dressings clean dry and intact.  No evidence of peritonitis.  No incarcerated hernias. Ext:  SCDs BLE.  No mjr edema.  No cyanosis Skin: No petechiae / purpura  Results:   Labs: Results for orders placed or performed during the hospital encounter of 02/07/16 (from the past 48 hour(s))  Basic metabolic panel      Status: Abnormal   Collection Time: 02/09/16  5:03 AM  Result Value Ref Range   Sodium 128 (L) 135 - 145 mmol/L   Potassium 3.8 3.5 - 5.1 mmol/L   Chloride 95 (L) 101 - 111 mmol/L   CO2 28 22 - 32 mmol/L   Glucose, Bld 124 (H) 65 - 99 mg/dL   BUN 13 6 - 20 mg/dL   Creatinine, Ser 0.64 0.44 - 1.00 mg/dL   Calcium 9.2 8.9 - 10.3 mg/dL   GFR calc non Af Amer >60 >60 mL/min   GFR calc Af Amer >60 >60 mL/min    Comment: (NOTE) The eGFR has been calculated using the CKD EPI equation. This calculation has not been validated in all clinical situations. eGFR's persistently <60 mL/min signify possible Chronic Kidney Disease.    Anion gap 5 5 - 15  CBC     Status: Abnormal   Collection Time: 02/09/16  5:03 AM  Result Value Ref Range   WBC 13.7 (H) 4.0 - 10.5 K/uL   RBC 3.83 (L) 3.87 - 5.11 MIL/uL   Hemoglobin 9.3 (L) 12.0 - 15.0 g/dL   HCT 29.9 (L) 36.0 - 46.0 %   MCV 78.1 78.0 - 100.0 fL   MCH 24.3 (L) 26.0 - 34.0 pg   MCHC 31.1 30.0 - 36.0 g/dL   RDW 17.5 (H) 11.5 - 15.5 %   Platelets 225 150 - 400 K/uL  Basic metabolic panel     Status: Abnormal   Collection Time: 02/10/16  5:27 AM  Result Value Ref Range   Sodium 131 (L) 135 - 145 mmol/L   Potassium 4.0 3.5 - 5.1 mmol/L   Chloride 97 (L) 101 - 111 mmol/L   CO2 28 22 - 32 mmol/L   Glucose, Bld 118 (H) 65 - 99 mg/dL   BUN 12 6 - 20 mg/dL   Creatinine, Ser 0.58 0.44 - 1.00 mg/dL   Calcium 9.5 8.9 - 10.3 mg/dL   GFR calc non Af Amer >60 >60 mL/min   GFR calc Af Amer >60 >60 mL/min    Comment: (NOTE) The eGFR has been calculated using the CKD EPI equation. This calculation has not been validated in all clinical situations. eGFR's persistently <60 mL/min signify possible Chronic Kidney Disease.    Anion gap 6 5 - 15  CBC     Status: Abnormal   Collection Time: 02/10/16  5:27 AM  Result Value Ref Range   WBC 12.1 (H) 4.0 - 10.5 K/uL   RBC 3.69 (L) 3.87 - 5.11 MIL/uL   Hemoglobin 9.0 (L) 12.0 - 15.0 g/dL   HCT 29.0 (L) 36.0  - 46.0 %   MCV 78.6 78.0 - 100.0 fL   MCH 24.4 (L) 26.0 - 34.0 pg   MCHC 31.0 30.0 - 36.0 g/dL   RDW 17.5 (H) 11.5 - 15.5 %   Platelets 238 150 - 400 K/uL    Imaging / Studies: No results found.  Medications / Allergies: per chart  Antibiotics: Anti-infectives    Start     Dose/Rate Route Frequency Ordered Stop   02/07/16 2100  cefoTEtan (CEFOTAN) 2 g in dextrose 5 % 50 mL  IVPB     2 g 100 mL/hr over 30 Minutes Intravenous Every 12 hours 02/07/16 1509 02/07/16 2213   02/07/16 0618  cefoTEtan (CEFOTAN) 2 g in dextrose 5 % 50 mL IVPB     2 g 100 mL/hr over 30 Minutes Intravenous On call to O.R. 02/07/16 0618 02/07/16 0912        Note: Portions of this report may have been transcribed using voice recognition software. Every effort was made to ensure accuracy; however, inadvertent computerized transcription errors may be present.   Any transcriptional errors that result from this process are unintentional.     Adin Hector, M.D., F.A.C.S. Gastrointestinal and Minimally Invasive Surgery [facility] [address] [place], [place] [address] [phone] Main / Paging   02/10/2016

## 2016-02-10 NOTE — Clinical Social Work Placement (Signed)
Patient accepts bed offer at [facility] [place], facility aware.   CLINICAL SOCIAL WORK PLACEMENT  NOTE  Date:  02/10/2016  Patient Details  Name: Krystal Clarke MRN: [id number] Date of Birth: 01-24-1936  Clinical Social Work is seeking post-discharge placement for this patient at the [place] level of care (*CSW will initial, date and re-position this form in  chart as items are completed):  Yes   Patient/family provided with [facility] Work Department's list of facilities offering this level of care within the geographic area requested by the patient (or if unable, by the patient's family).  Yes   Patient/family informed of their freedom to choose among providers that offer the needed level of care, that participate in Medicare, Medicaid or managed care program needed by the patient, have an available bed and are willing to accept the patient.  Yes   Patient/family informed of [facility]'s ownership interest in [facility] and [facility], as well as of the fact that they are under no obligation to receive care at these facilities.  PASRR submitted to EDS on 02/10/16     PASRR number received on 02/10/16     Existing PASRR number confirmed on       FL2 transmitted to all facilities in geographic area requested by pt/family on 02/10/16     FL2 transmitted to all facilities within larger geographic area on       Patient informed that his/her managed care company has contracts with or will negotiate with certain facilities, including the following:        Yes   Patient/family informed of bed offers received.  Patient chooses bed at  [facility] [place] )     Physician recommends and patient chooses bed at      Patient to be transferred to  [facility] [place] ) on  .  Patient to be transferred to facility by       Patient family notified on   of transfer.  Name of family member notified:         PHYSICIAN       Additional Comment:    _______________________________________________ Glendon Axe A 02/10/2016, 1:04 PM

## 2016-02-11 LAB — GLUCOSE, CAPILLARY
GLUCOSE-CAPILLARY: 102 mg/dL — AB (ref 65–99)
GLUCOSE-CAPILLARY: 104 mg/dL — AB (ref 65–99)
GLUCOSE-CAPILLARY: 125 mg/dL — AB (ref 65–99)
GLUCOSE-CAPILLARY: 132 mg/dL — AB (ref 65–99)
GLUCOSE-CAPILLARY: 90 mg/dL (ref 65–99)
GLUCOSE-CAPILLARY: 91 mg/dL (ref 65–99)

## 2016-02-11 MED ORDER — PANTOPRAZOLE SODIUM 40 MG PO TBEC
40.0000 mg | DELAYED_RELEASE_TABLET | Freq: Two times a day (BID) | ORAL | Status: DC
  Administered 2016-02-11 – 2016-02-13 (×4): 40 mg via ORAL
  Filled 2016-02-11 (×4): qty 1

## 2016-02-11 MED ORDER — FLUCONAZOLE 200 MG PO TABS
200.0000 mg | ORAL_TABLET | Freq: Every day | ORAL | Status: AC
Start: 2016-02-11 — End: 2016-02-13
  Administered 2016-02-11 – 2016-02-13 (×3): 200 mg via ORAL
  Filled 2016-02-11 (×3): qty 1

## 2016-02-11 MED ORDER — MAGIC MOUTHWASH
15.0000 mL | Freq: Four times a day (QID) | ORAL | Status: AC
  Administered 2016-02-11 – 2016-02-12 (×7): 15 mL via ORAL
  Filled 2016-02-11 (×8): qty 15

## 2016-02-11 NOTE — Progress Notes (Signed)
[facility]  [address] [place], [place] [address] Phone: [phone] FAX: [place] [phone] Jul 05, 1935  CARE TEAM:  PCP: Vidal Schwalbe, MD  Outpatient Care Team: Patient Care Team: Harlan Stains, MD as PCP - General (Family Medicine)  Inpatient Treatment Team: Treatment Team: Attending Provider: Leighton Ruff, MD; Registered Nurse: Mertha Baars, RN; Technician: Tenna Child, NT; Registered Nurse: Joselyn Glassman, RN; Technician: Leda Quail, NT; Technician: Etheleen Sia, NT; Registered Nurse: Celedonio Savage, RN; Social Worker: Rigoberto Noel, LCSW  Problem List:   Principal Problem:   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 02/07/2016 Active Problems:   HYPERTRIGLYCERIDEMIA   Anxiety state   Essential hypertension   GERD   Sleep apnea   Hereditary and idiopathic peripheral neuropathy   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 ([place])   Sjogren's syndrome ([place])   Atrial fibrillation ([place])   Seizure disorder ([place])   B12 deficiency   Elevated white blood cell count   Obesity (BMI 30-39.9)   Diabetes mellitus without complication ([place])   Hypoxia - on oxygen   4 Days Post-Op  02/07/2016  POST-OPERATIVE DIAGNOSIS:  COLOVESICAL FISTULA  PROCEDURE: XI ROBOTIC ASSISTED SIGMOIDECTOMY WITH CLOSURE OF COLOVESICAL FISTULA   Surgeon(s): Leighton Ruff, MD Michael Boston, MD    Assessment  FAIR  Plan:  -By mouth daily eight sore throat.  Magic mouthwash.  Fluconazole.  No strong evidence of thrush but may have deeper esophagitis. -soft diet as tolerated per protocol -Afib - Hgb stable.  Restart Eliquis & follow -pain control.  -Foley x 7d minimum given colovesical fistula repair of inflamed bladder -Diuresis as tolerated -wean off O2 as tolerated -Chronic pain control -Chronic anxiety control -HTN control -DM  control -VTE prophylaxis- SCDs, etc -mobilize as tolerated to help recovery - Get her up  -Disposition: Recommendation for skilled nursing facility.  Tentatively accepted a [facility] place.  She is not ready to go there yet.  Hopefully in a day or so.  Adin Hector, M.D., F.A.C.S. Gastrointestinal and Minimally Invasive Surgery [facility] [address] [place], [place] [address] [phone] Main / Paging   02/11/2016  Subjective:  Intermittent abdominal cramping.  Medicines help. Tolerating soft diet but not much of an appetite.  Not wanting to get out of bed. Wanting to shower though. Thinks that she is passing gas out her vagina.  Husband at bedside.    Objective:  Vital signs:  Vitals:   02/10/16 0445 02/10/16 1400 02/10/16 2158 02/11/16 0540  BP: (!) 109/56 (!) 100/47 112/64 124/66  Pulse: 78 63 71 69  Resp: 16   16  Temp: 98.7 F (37.1 C) 98.6 F (37 C) 98.3 F (36.8 C) 99.5 F (37.5 C)  TempSrc: Oral Oral Oral Oral  SpO2: 95% 92% 93% (!) 88%  Weight:      Height:        Last BM Date: 02/09/16  Intake/Output   Yesterday:  11/25 0701 - 11/26 0700 In: 600 [P.O.:600] Out: 2830 [Urine:2800; Drains:30] This shift:  Total I/O In: 600 [P.O.:600] Out: 410 [Urine:400; Drains:10]  Bowel function:  Flatus: No  BM:  No  Drain: Serosanguinous   Physical Exam:  General: Pt awake/alert/oriented x4 in No acute distress Eyes: PERRL, normal EOM.  Sclera clear.  No icterus Neuro: CN II-XII intact w/o focal sensory/motor deficits. Lymph: No head/neck/groin lymphadenopathy Psych:  No delerium/psychosis/paranoia  HENT: Normocephalic, Mucus membranes moist.  No thrush Neck: Supple, No tracheal deviation Chest: No chest wall pain w good excursion CV:  Pulses intact.  Regular rhythm MS: Normal AROM mjr joints.  No obvious deformity Abdomen: Morbidly obese but Soft.  Nondistended.  Mildly tender at incisions only.     Dressings clean dry and intact.  No evidence of peritonitis.  No incarcerated hernias. No feculent drainage out vagina.  No active bleeding.   Ext:  SCDs BLE.  No mjr edema.  No cyanosis Skin: No petechiae / purpura  Results:   Labs: Results for orders placed or performed during the hospital encounter of 02/07/16 (from the past 48 hour(s))  Basic metabolic panel     Status: Abnormal   Collection Time: 02/10/16  5:27 AM  Result Value Ref Range   Sodium 131 (L) 135 - 145 mmol/L   Potassium 4.0 3.5 - 5.1 mmol/L   Chloride 97 (L) 101 - 111 mmol/L   CO2 28 22 - 32 mmol/L   Glucose, Bld 118 (H) 65 - 99 mg/dL   BUN 12 6 - 20 mg/dL   Creatinine, Ser 0.58 0.44 - 1.00 mg/dL   Calcium 9.5 8.9 - 10.3 mg/dL   GFR calc non Af Amer >60 >60 mL/min   GFR calc Af Amer >60 >60 mL/min    Comment: (NOTE) The eGFR has been calculated using the CKD EPI equation. This calculation has not been validated in all clinical situations. eGFR's persistently <60 mL/min signify possible Chronic Kidney Disease.    Anion gap 6 5 - 15  CBC     Status: Abnormal   Collection Time: 02/10/16  5:27 AM  Result Value Ref Range   WBC 12.1 (H) 4.0 - 10.5 K/uL   RBC 3.69 (L) 3.87 - 5.11 MIL/uL   Hemoglobin 9.0 (L) 12.0 - 15.0 g/dL   HCT 29.0 (L) 36.0 - 46.0 %   MCV 78.6 78.0 - 100.0 fL   MCH 24.4 (L) 26.0 - 34.0 pg   MCHC 31.0 30.0 - 36.0 g/dL   RDW 17.5 (H) 11.5 - 15.5 %   Platelets 238 150 - 400 K/uL  Urology - JP creatinine     Status: None   Collection Time: 02/10/16  8:32 AM  Result Value Ref Range   Creat, Fluid 0.5 mg/dL    Comment: (NOTE) No normal range established for this test Results should be evaluated in conjunction with serum values Performed at [facility]    Fluid Type-FCRE JP DRAINAGE   Glucose, capillary     Status: None   Collection Time: 02/10/16 12:00 PM  Result Value Ref Range   Glucose-Capillary 94 65 - 99 mg/dL  Glucose, capillary     Status: None   Collection Time: 02/10/16   4:53 PM  Result Value Ref Range   Glucose-Capillary 96 65 - 99 mg/dL  Glucose, capillary     Status: Abnormal   Collection Time: 02/10/16  8:17 PM  Result Value Ref Range   Glucose-Capillary 115 (H) 65 - 99 mg/dL  Glucose, capillary     Status: Abnormal   Collection Time: 02/11/16 12:11 AM  Result Value Ref Range   Glucose-Capillary 125 (H) 65 - 99 mg/dL  Glucose, capillary     Status: None   Collection Time: 02/11/16  4:08 AM  Result Value Ref Range   Glucose-Capillary 91 65 - 99 mg/dL  Glucose, capillary     Status: None   Collection Time: 02/11/16  8:38 AM  Result Value Ref Range   Glucose-Capillary 90 65 - 99 mg/dL    Imaging / Studies: No results found.  Medications / Allergies: per chart  Antibiotics: Anti-infectives    Start     Dose/Rate Route Frequency Ordered Stop   02/07/16 2100  cefoTEtan (CEFOTAN) 2 g in dextrose 5 % 50 mL IVPB     2 g 100 mL/hr over 30 Minutes Intravenous Every 12 hours 02/07/16 1509 02/07/16 2213   02/07/16 0618  cefoTEtan (CEFOTAN) 2 g in dextrose 5 % 50 mL IVPB     2 g 100 mL/hr over 30 Minutes Intravenous On call to O.R. 02/07/16 0618 02/07/16 0912        Note: Portions of this report may have been transcribed using voice recognition software. Every effort was made to ensure accuracy; however, inadvertent computerized transcription errors may be present.   Any transcriptional errors that result from this process are unintentional.     Adin Hector, M.D., F.A.C.S. Gastrointestinal and Minimally Invasive Surgery [facility] [address], [place], [place] [address] [phone] Main / Paging   02/11/2016

## 2016-02-12 ENCOUNTER — Encounter (HOSPITAL_COMMUNITY): Payer: Self-pay | Admitting: General Surgery

## 2016-02-12 LAB — CBC
HCT: 31.1 % — ABNORMAL LOW (ref 36.0–46.0)
HEMOGLOBIN: 9.8 g/dL — AB (ref 12.0–15.0)
MCH: 24.4 pg — ABNORMAL LOW (ref 26.0–34.0)
MCHC: 31.5 g/dL (ref 30.0–36.0)
MCV: 77.4 fL — ABNORMAL LOW (ref 78.0–100.0)
PLATELETS: 302 10*3/uL (ref 150–400)
RBC: 4.02 MIL/uL (ref 3.87–5.11)
RDW: 17.5 % — ABNORMAL HIGH (ref 11.5–15.5)
WBC: 13.2 10*3/uL — ABNORMAL HIGH (ref 4.0–10.5)

## 2016-02-12 LAB — BASIC METABOLIC PANEL
Anion gap: 8 (ref 5–15)
BUN: 14 mg/dL (ref 6–20)
CALCIUM: 9.7 mg/dL (ref 8.9–10.3)
CO2: 29 mmol/L (ref 22–32)
CREATININE: 0.65 mg/dL (ref 0.44–1.00)
Chloride: 94 mmol/L — ABNORMAL LOW (ref 101–111)
GFR calc Af Amer: >60 mL/min (ref >60)
GLUCOSE: 122 mg/dL — AB (ref 65–99)
POTASSIUM: 3.8 mmol/L (ref 3.5–5.1)
SODIUM: 131 mmol/L — AB (ref 135–145)

## 2016-02-12 LAB — GLUCOSE, CAPILLARY
GLUCOSE-CAPILLARY: 146 mg/dL — AB (ref 65–99)
GLUCOSE-CAPILLARY: 164 mg/dL — AB (ref 65–99)
Glucose-Capillary: 119 mg/dL — ABNORMAL HIGH (ref 65–99)
Glucose-Capillary: 117 mg/dL — ABNORMAL HIGH (ref 65–99)
Glucose-Capillary: 149 mg/dL — ABNORMAL HIGH (ref 65–99)
Glucose-Capillary: 146 mg/dL — ABNORMAL HIGH (ref 65–99)

## 2016-02-12 NOTE — Progress Notes (Signed)
Pt was assisted to bsc to have BM. Afterwards assisted pt to chair. Pt is a 2 person max assist to ambulate and is very difficult to move. Pt was able to sit in chair for an hour comfortably.  Rosie Fate

## 2016-02-12 NOTE — Discharge Instructions (Signed)

## 2016-02-12 NOTE — Care Management Important Message (Signed)
Important Message  Patient Details  Name: Krystal Clarke MRN: [id number] Date of Birth: May 23, 1935   Medicare Important Message Given:  Yes    Camillo Flaming 02/12/2016, 11:11 AMImportant Message  Patient Details  Name: Krystal Clarke MRN: [id number] Date of Birth: Mar 05, 1936   Medicare Important Message Given:  Yes    Camillo Flaming 02/12/2016, 11:11 AM

## 2016-02-12 NOTE — Progress Notes (Signed)
Physical Therapy Treatment Patient Details Name: Krystal Clarke MRN: [id number] DOB: 1935/06/28 Today's Date: 02/12/2016    History of Present Illness Pt s/p sigmoidectomy with closure of colovesical fistula.  Pt with hx of CVA, Sjogrens Syndrome, bilat LE neuropathy, DM, legal blindness and CVA    PT Comments    Progressing slowly with mobility.   Follow Up Recommendations  SNF     Equipment Recommendations  None recommended by PT    Recommendations for Other Services       Precautions / Restrictions Precautions Precautions: Fall Precaution Comments: JP drain, R side Restrictions Weight Bearing Restrictions: No    Mobility  Bed Mobility Overal bed mobility: Needs Assistance Bed Mobility: Supine to Sit     Supine to sit: HOB elevated;Min assist     General bed mobility comments: Assist for trunk.   Transfers Overall transfer level: Needs assistance Equipment used: Rolling walker (2 wheeled) Transfers: Sit to/from [facility] Sit to Stand: Mod assist;From elevated surface Stand pivot transfers: Min assist       General transfer comment: Assist to rise, stabilize, control descent. VCs safety, technique, hand placement  Ambulation/Gait Ambulation/Gait assistance: Min assist;+2 safety/equipment Ambulation Distance (Feet): 20 Feet Assistive device: Rolling walker (2 wheeled) Gait Pattern/deviations: Step-to pattern;Step-through pattern     General Gait Details: Assist to stabilize. Followed closely with recliner. Pt fatigues easily.    Stairs            Wheelchair Mobility    Modified Rankin (Stroke Patients Only)       Balance                                    Cognition Arousal/Alertness: Awake/alert Behavior During Therapy: WFL for tasks assessed/performed Overall Cognitive Status: Within Functional Limits for tasks assessed                      Exercises      General Comments         Pertinent Vitals/Pain Pain Assessment: Faces Faces Pain Scale: Hurts little more Pain Location: abdomen Pain Descriptors / Indicators: Sore Pain Intervention(s): Monitored during session;Repositioned    Home Living                      Prior Function            PT Goals (current goals can now be found in the care plan section) Progress towards PT goals: Progressing toward goals    Frequency    Min 3X/week      PT Plan Current plan remains appropriate    Co-evaluation             End of Session   Activity Tolerance: Patient tolerated treatment well Patient left: in chair;with call bell/phone within reach     Time: [id number] PT Time Calculation (min) (ACUTE ONLY): 36 min  Charges:  $Gait Training: 23-37 mins $Therapeutic Activity: 23-37 mins                    G Codes:      Weston Anna, MPT Pager: [phone]

## 2016-02-12 NOTE — Progress Notes (Signed)
5 Days Post-Op RObotic assisted repair of colovesical fistula Subjective: Doing ok.  Having bowel function.  Ambulating with assistance.  Back on anticoagulation.    Objective: Vital signs in last 24 hours: Temp:  [98.1 F (36.7 C)-99.3 F (37.4 C)] 98.1 F (36.7 C) (11/27 0422) Pulse Rate:  [77-90] 77 (11/27 0422) Resp:  [17-18] 18 (11/27 0422) BP: (100-115)/(53-80) 101/53 (11/27 0422) SpO2:  [88 %-93 %] 93 % (11/27 0422)   Intake/Output from previous day: 11/26 0701 - 11/27 0700 In: 1320 [P.O.:1320] Out: 1525 [Urine:1500; Drains:25] Intake/Output this shift: No intake/output data recorded.   General appearance: alert and cooperative GI: soft, non-distended  Incision: healing well, no significant drainage  Lab Results:   Recent Labs  02/10/16 0527 02/12/16 0713  WBC 12.1* 13.2*  HGB 9.0* 9.8*  HCT 29.0* 31.1*  PLT 238 302   BMET  Recent Labs  02/10/16 0527 02/12/16 0713  NA 131* 131*  K 4.0 3.8  CL 97* 94*  CO2 28 29  GLUCOSE 118* 122*  BUN 12 14  CREATININE 0.58 0.65  CALCIUM 9.5 9.7   PT/INR No results for input(s): LABPROT, INR in the last 72 hours. ABG No results for input(s): PHART, HCO3 in the last 72 hours.  Invalid input(s): PCO2, PO2  MEDS, Scheduled . acetaminophen  1,000 mg Oral TID  . apixaban  5 mg Oral BID  . cyclobenzaprine  5 mg Oral QHS  . diltiazem  180 mg Oral Daily  . dorzolamide  1 drop Both Eyes BID  . feeding supplement (ENSURE ENLIVE)  237 mL Oral BID BM  . fenofibrate  160 mg Oral Daily  . fluconazole  200 mg Oral Daily  . furosemide  40 mg Oral Daily  . insulin aspart  0-15 Units Subcutaneous Q4H  . lip balm  1 application Topical BID  . magic mouthwash  15 mL Oral QID  . metFORMIN  500 mg Oral Q supper  . pantoprazole  40 mg Oral BID  . pregabalin  150 mg Oral q morning - 10a  . pregabalin  300 mg Oral QHS  . sodium chloride flush  3 mL Intravenous Q12H  . timolol  1 drop Both Eyes BID     Studies/Results: No results found.  Assessment: s/p Procedure(s): ROBOT ASSISTED LAPAROSCOPIC PARTIAL COLECTOMY Patient Active Problem List   Diagnosis Date Noted  . Hypoxia - on oxygen 02/10/2016  . Diabetes mellitus without complication ([place])   . Obesity (BMI 30-39.9) 02/08/2016  . Lichen sclerosus 0000000  . 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 02/07/2016 02/07/2016  . Chest pain 11/04/2014  . Elevated white blood cell count 11/04/2014  . B12 deficiency 05/20/2013  . Seizure disorder ([place]) 12/07/2012  . Atrial fibrillation ([place]) 12/06/2012  . Sjogren's syndrome ([place])   . Hereditary and idiopathic peripheral neuropathy 05/09/2012  . 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 ([place]) 05/09/2012  . History of detached retina repair 02/24/2012  . Primary open angle glaucoma of both eyes, severe stage 02/24/2012  . ANEMIA, B12 DEFICIENCY 07/19/2009  . Anxiety state 01/27/2009  . Sleep apnea 12/19/2008  . MIXED HYPERLIPIDEMIA 07/18/2008  . HYPERTRIGLYCERIDEMIA 08/28/2006  . MIGRAINE HEADACHE 08/28/2006  . Essential hypertension 08/28/2006  . GERD 08/28/2006  . PSORIASIS 08/28/2006  . OSTEOARTHRITIS 08/28/2006  . OSTEOPOROSIS 08/28/2006    Expected post op course  Plan: Plan for discharge tomorrow  Will get bladder study in am and if neg for leak, will d/c foley Plan  for SNF tom afternoon for rehab   LOS: 5 days     .Rosario Adie, [facility], [place]   02/12/2016 8:33 AM

## 2016-02-12 NOTE — Clinical Social Work Note (Signed)
Patient has bed at SNF, [facility] once medically stable for discharge.   EDD: Tuesday, 11/28 per MD.   MSW remains available as needed.   Glendon Axe, MSW [phone] 02/12/2016 9:35 AM

## 2016-02-13 ENCOUNTER — Inpatient Hospital Stay (HOSPITAL_COMMUNITY): Payer: Medicare Other

## 2016-02-13 DIAGNOSIS — N329 Bladder disorder, unspecified: Secondary | ICD-10-CM | POA: Diagnosis not present

## 2016-02-13 DIAGNOSIS — M1990 Unspecified osteoarthritis, unspecified site: Secondary | ICD-10-CM | POA: Diagnosis not present

## 2016-02-13 DIAGNOSIS — R5381 Other malaise: Secondary | ICD-10-CM | POA: Diagnosis not present

## 2016-02-13 DIAGNOSIS — L988 Other specified disorders of the skin and subcutaneous tissue: Secondary | ICD-10-CM | POA: Diagnosis not present

## 2016-02-13 DIAGNOSIS — G4700 Insomnia, unspecified: Secondary | ICD-10-CM | POA: Diagnosis not present

## 2016-02-13 DIAGNOSIS — M6281 Muscle weakness (generalized): Secondary | ICD-10-CM | POA: Diagnosis not present

## 2016-02-13 DIAGNOSIS — K219 Gastro-esophageal reflux disease without esophagitis: Secondary | ICD-10-CM | POA: Diagnosis not present

## 2016-02-13 DIAGNOSIS — D638 Anemia in other chronic diseases classified elsewhere: Secondary | ICD-10-CM | POA: Diagnosis not present

## 2016-02-13 DIAGNOSIS — E785 Hyperlipidemia, unspecified: Secondary | ICD-10-CM | POA: Diagnosis not present

## 2016-02-13 DIAGNOSIS — R531 Weakness: Secondary | ICD-10-CM | POA: Diagnosis not present

## 2016-02-13 DIAGNOSIS — D62 Acute posthemorrhagic anemia: Secondary | ICD-10-CM | POA: Diagnosis not present

## 2016-02-13 DIAGNOSIS — K5901 Slow transit constipation: Secondary | ICD-10-CM | POA: Diagnosis not present

## 2016-02-13 DIAGNOSIS — N322 Vesical fistula, not elsewhere classified: Secondary | ICD-10-CM | POA: Diagnosis not present

## 2016-02-13 DIAGNOSIS — N321 Vesicointestinal fistula: Secondary | ICD-10-CM | POA: Diagnosis not present

## 2016-02-13 DIAGNOSIS — I4891 Unspecified atrial fibrillation: Secondary | ICD-10-CM | POA: Diagnosis not present

## 2016-02-13 DIAGNOSIS — R278 Other lack of coordination: Secondary | ICD-10-CM | POA: Diagnosis not present

## 2016-02-13 DIAGNOSIS — R2689 Other abnormalities of gait and mobility: Secondary | ICD-10-CM | POA: Diagnosis not present

## 2016-02-13 DIAGNOSIS — M792 Neuralgia and neuritis, unspecified: Secondary | ICD-10-CM | POA: Diagnosis not present

## 2016-02-13 DIAGNOSIS — H409 Unspecified glaucoma: Secondary | ICD-10-CM | POA: Diagnosis not present

## 2016-02-13 DIAGNOSIS — N3289 Other specified disorders of bladder: Secondary | ICD-10-CM | POA: Diagnosis not present

## 2016-02-13 DIAGNOSIS — E119 Type 2 diabetes mellitus without complications: Secondary | ICD-10-CM | POA: Diagnosis not present

## 2016-02-13 LAB — GLUCOSE, CAPILLARY
GLUCOSE-CAPILLARY: 133 mg/dL — AB (ref 65–99)
GLUCOSE-CAPILLARY: 135 mg/dL — AB (ref 65–99)
GLUCOSE-CAPILLARY: 108 mg/dL — AB (ref 65–99)
GLUCOSE-CAPILLARY: 162 mg/dL — AB (ref 65–99)
Glucose-Capillary: 130 mg/dL — ABNORMAL HIGH (ref 65–99)
Glucose-Capillary: 133 mg/dL — ABNORMAL HIGH (ref 65–99)

## 2016-02-13 MED ORDER — IOTHALAMATE MEGLUMINE 17.2 % UR SOLN: 250.0000 mL | Freq: Once | URETHRAL | Status: DC | PRN

## 2016-02-13 MED ORDER — TRAMADOL HCL 50 MG PO TABS: 50.0000 mg | ORAL_TABLET | Freq: Four times a day (QID) | ORAL | 0 refills | Status: DC | PRN

## 2016-02-13 NOTE — Discharge Summary (Signed)
Physician Discharge Summary  Patient ID: Krystal Clarke MRN: [id number] DOB/AGE: 1936-01-27 80 y.o.  Admit date: 02/07/2016 Discharge date: 02/13/2016  Admission Diagnoses: colovesical fistula  Discharge Diagnoses:  Principal Problem:   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 02/07/2016 Active Problems:   HYPERTRIGLYCERIDEMIA   Anxiety state   Essential hypertension   GERD   Sleep apnea   Hereditary and idiopathic peripheral neuropathy   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 ([place])   Sjogren's syndrome (HCC)   Atrial fibrillation (HCC)   Seizure disorder (HCC)   B12 deficiency   Elevated white blood cell count   Obesity (BMI 30-39.9)   Diabetes mellitus without complication ([name])   Hypoxia - on oxygen   Discharged Condition: good  Hospital Course: Pt was admitted after surgery.  She slowly recovered.  Her foley was left in place due to bladder repair.  He diet was advanced slowly.  She continued to require multiple assists for ambulation.  SNF was recommended by PT for rehab.  By POD 6 she was tolerating a diet and having bowel function.  A cystogram was performed prior to d/c to determine if her foley could be removed.    Consults: None  Significant Diagnostic Studies: labs: cbc, chemistry  Treatments: IV hydration, analgesia: tramadol, insulin: regular and surgery: robotic partial colectomy  Discharge Exam: Blood pressure 119/74, pulse 75, temperature 98.7 F (37.1 C), temperature source Axillary, resp. rate 14, height 5\' 1"  (1.549 m), weight 81.2 kg (179 lb), SpO2 94 %. General appearance: alert and cooperative GI: soft, non-tender Incision/Wound: clean, dry, intact  Disposition: SNF     Medication List    TAKE these medications   apixaban 5 MG Tabs tablet Commonly known as:  ELIQUIS Take 1 tablet (5 mg total) by mouth 2 (two) times daily.   BAYER BACK & BODY 500-32.5 MG Tabs Generic drug:   Aspirin-Caffeine Take 1 tablet by mouth as needed.   cyclobenzaprine 5 MG tablet Commonly known as:  FLEXERIL Take 1 tablet (5 mg total) by mouth at bedtime.   diltiazem 180 MG 24 hr capsule Commonly known as:  CARDIZEM CD Take 1 capsule (180 mg total) by mouth daily.   docusate sodium 100 MG capsule Commonly known as:  COLACE Take 100 mg by mouth daily.   dorzolamide 2 % ophthalmic solution Commonly known as:  TRUSOPT Place 1 drop into both eyes 2 (two) times daily.   fenofibrate 145 MG tablet Commonly known as:  TRICOR Take 145 mg by mouth daily.   metFORMIN 500 MG 24 hr tablet Commonly known as:  GLUCOPHAGE-XR Take 500 mg by mouth every evening.   omeprazole-sodium bicarbonate 40-1100 MG capsule Commonly known as:  ZEGERID Take 1 capsule by mouth daily before breakfast.   ondansetron 4 MG tablet Commonly known as:  ZOFRAN Take 4 mg by mouth every 8 (eight) hours as needed for nausea or vomiting.   pregabalin 150 MG capsule Commonly known as:  LYRICA Take 150-300 mg by mouth 2 (two) times daily. Pt takes one capsule every morning and two at bedtime.   promethazine 25 MG tablet Commonly known as:  PHENERGAN Take 12.5-25 mg by mouth every 6 (six) hours as needed for nausea or vomiting.   timolol 0.5 % ophthalmic solution Commonly known as:  BETIMOL Place 1 drop into both eyes 2 (two) times daily.   traMADol 50 MG tablet Commonly known as:  ULTRAM Take 1-2 tablets (50-100 mg total) by mouth every 6 (  six) hours as needed for moderate pain or severe pain.   traZODone 50 MG tablet Commonly known as:  DESYREL Take 25-50 mg by mouth at bedtime as needed for sleep.       Contact information for follow-up providers    Rosario Adie., MD. Schedule an appointment as soon as possible for a visit in 2 week(s).   Specialty:  General Surgery Contact information: [place] [address] [phone]            Contact information for  after-discharge care    Destination    [facility] Follow up.   Specialty:  [place] information: [place] [phone]                  Signed: Rosario Adie 0000000, 12:38 PM

## 2016-02-13 NOTE — Progress Notes (Signed)
Patient is set to discharge to [facility] today. Patient & family aware. Discharge packet given to RN. [facility] called for transport at 4:12pm.   Kingsley Spittle, [facility] Clinical Social Worker [phone]

## 2016-02-13 NOTE — Clinical Social Work Placement (Signed)
   CLINICAL SOCIAL WORK PLACEMENT  NOTE  Date:  02/13/2016  Patient Details  Name: Krystal Clarke MRN: [id number] Date of Birth: May 17, 1935  Clinical Social Work is seeking post-discharge placement for this patient at the [place] level of care (*CSW will initial, date and re-position this form in  chart as items are completed):  Yes   Patient/family provided with [facility] Work Department's list of facilities offering this level of care within the geographic area requested by the patient (or if unable, by the patient's family).  Yes   Patient/family informed of their freedom to choose among providers that offer the needed level of care, that participate in Medicare, Medicaid or managed care program needed by the patient, have an available bed and are willing to accept the patient.  Yes   Patient/family informed of [facility]'s ownership interest in [facility] and [facility], as well as of the fact that they are under no obligation to receive care at these facilities.  PASRR submitted to EDS on 02/10/16     PASRR number received on 02/10/16     Existing PASRR number confirmed on       FL2 transmitted to all facilities in geographic area requested by pt/family on 02/10/16     FL2 transmitted to all facilities within larger geographic area on       Patient informed that his/her managed care company has contracts with or will negotiate with certain facilities, including the following:        Yes   Patient/family informed of bed offers received.  Patient chooses bed at  [facility] )     Physician recommends and patient chooses bed at      Patient to be transferred to  [facility] [place] ) on 02/13/16.  Patient to be transferred to facility by  [facility] )     Patient family notified on 02/13/16 of transfer.  Name of family member notified:   (Pt's spouse, Denyse Amass to be notified once dc summary completed )      PHYSICIAN Please sign FL2, Please prepare priority discharge summary, including medications     Additional Comment:    _______________________________________________ Glendon Axe A 02/13/2016, 10:56 AM

## 2016-02-13 NOTE — Progress Notes (Signed)
Report called and given to Rn at [facility], foley removed prior to discharge Neta Mends RN 4:43 PM

## 2016-02-14 ENCOUNTER — Non-Acute Institutional Stay (SKILLED_NURSING_FACILITY): Payer: Medicare Other | Admitting: Internal Medicine

## 2016-02-14 ENCOUNTER — Encounter: Payer: Self-pay | Admitting: Internal Medicine

## 2016-02-14 DIAGNOSIS — K219 Gastro-esophageal reflux disease without esophagitis: Secondary | ICD-10-CM | POA: Diagnosis not present

## 2016-02-14 DIAGNOSIS — R5381 Other malaise: Secondary | ICD-10-CM

## 2016-02-14 DIAGNOSIS — I4891 Unspecified atrial fibrillation: Secondary | ICD-10-CM | POA: Diagnosis not present

## 2016-02-14 DIAGNOSIS — E119 Type 2 diabetes mellitus without complications: Secondary | ICD-10-CM | POA: Diagnosis not present

## 2016-02-14 DIAGNOSIS — M1990 Unspecified osteoarthritis, unspecified site: Secondary | ICD-10-CM

## 2016-02-14 DIAGNOSIS — N321 Vesicointestinal fistula: Secondary | ICD-10-CM

## 2016-02-14 DIAGNOSIS — D638 Anemia in other chronic diseases classified elsewhere: Secondary | ICD-10-CM

## 2016-02-14 NOTE — Progress Notes (Signed)
LOCATION: [facility]  PCP: Vidal Schwalbe, MD   Code Status: Full Code  Goals of care: Advanced Directive information Advanced Directives 02/07/2016  Does Patient Have a Medical Advance Directive? No  Type of Advance Directive -  Copy of [place] in Chart? -  Would patient like information on creating a medical advance directive? No - Patient declined  Pre-existing out of facility DNR order (yellow form or pink MOST form) -       Extended Emergency Contact Information Primary Emergency Contact: [facility] Address: [address]          [place], [place] [address] Johnnette Litter of [place] Phone: [phone] Mobile Phone: [phone] Relation: Spouse Secondary Emergency Contact: [name],[name]  [place] Mobile Phone: [phone] Relation: Daughter   Allergies  Allergen Reactions  . Rivaroxaban Other (See Comments)    Reaction:  Back pain/headaches   . Codeine Nausea And Vomiting  . Hydrocodone Nausea And Vomiting  . Morphine And Related Nausea And Vomiting  . Oxycodone Nausea And Vomiting  . Pilocarpine Other (See Comments)    Reaction:  Unknown     Chief Complaint  Patient presents with  . New Admit To SNF    New Admission Visit     HPI:  Patient is a 80 y.o. female seen today for short term rehabilitation post hospital admission from 02/07/16-02/13/16 with colovesical fistula s/p robotic colectomy and bladder closure on 02/07/16. She is seen in her room today with her husband at bedside. She has medical story of HLD, HTN, GERD, afib, neuropathy among others.    Review of Systems:  Constitutional: Negative for fever, chills, diaphoresis. Feels weak and tired.  HENT: Negative for headache, congestion, nasal discharge, sore throat, difficulty swallowing.   Eyes: she is legally blind Respiratory: Negative for cough, shortness of breath and wheezing.   Cardiovascular: Negative for chest pain, palpitations,  leg swelling.  Gastrointestinal: Negative for heartburn,vomiting. Positive for some nausea, poor appetite and abdominal pain with movement and change of position. Last bowel movement was this morning. Genitourinary: Negative for dysuria and flank pain.  Musculoskeletal: Negative for back pain, fall in the facilty.  Skin: Negative for itching, rash.  Neurological: Negative for dizziness. Psychiatric/Behavioral: Negative for depression.    Past Medical History:  Diagnosis Date  . Acute respiratory failure with hypoxia ([place]) 12/06/2012  . Allergy   . Blindness of right eye   . Complication of anesthesia   . Dehydration with hyponatremia 12/07/2012  . Diabetes mellitus without complication ([place])    diagnosed 2 weeks ago- No CBG meter as of yet.  Marland Kitchen DIVERTICULITIS, HX OF 01/27/2009   Qualifier: Diagnosis of  By: Jerold Coombe    . Gait disorder 06/05/2012  . GERD (gastroesophageal reflux disease)   . Glaucoma   . Glucose intolerance (impaired glucose tolerance)   . History of diverticulitis   . Hypertension   . Legally blind   . Migraine triggered seizures ([place])    history of  . Migraines   . Neuropathy ([place])   . OP (osteoporosis)   . Osteoarthritis    osteoarthritis -right knee. uses walker. Left shoulder -"Limited ROM"  . Paroxysmal atrial fibrillation ([place]) 12/06/2012   Echo (12/07/12): Moderate focal basal hypertrophy of the septum, vigorous LV function, EF 65-70%, indeterminate diastolic function, normal wall motion, trivial MR, moderate LAE, trivial TR;  Xarelto started 11/2012  . Peripheral neuropathy (HCC)    neuropathy  . Pneumonia   .  PONV (postoperative nausea and vomiting)    no problems other past few surgeries  . SHINGLES, HX OF 07/17/2009   Qualifier: Diagnosis of  By: Jerold Coombe    . Siriasis   . Sjogren's syndrome ([place])    bilateral vision  . Sleep apnea    does not wear CPAP  . Stroke [facility])    residual - rare Intermittent expressive aphasia,   Past  Surgical History:  Procedure Laterality Date  . BONE TUMOR EXCISION Right    arm  . CATARACT EXTRACTION    . COLONOSCOPY WITH PROPOFOL N/A 06/09/2015   Procedure: COLONOSCOPY WITH PROPOFOL;  Surgeon: Ronald Lobo, MD;  Location: [facility];  Service: Endoscopy;  Laterality: N/A;  . DILATION AND CURETTAGE OF UTERUS    . ESOPHAGOGASTRODUODENOSCOPY (EGD) WITH PROPOFOL N/A 06/09/2015   Procedure: ESOPHAGOGASTRODUODENOSCOPY (EGD) WITH PROPOFOL;  Surgeon: Ronald Lobo, MD;  Location: [facility];  Service: Endoscopy;  Laterality: N/A;  . EYE SURGERY    . FOOT FRACTURE SURGERY Left 2007  . KNEE ARTHROSCOPY Right   . LUMBAR LAMINECTOMY    . ROBOT ASSISTED LAPAROSCOPIC PARTIAL COLECTOMY  02/07/2016   Procedure: ROBOT ASSISTED LAPAROSCOPIC PARTIAL COLECTOMY;  Surgeon: Leighton Ruff, MD;  Location: [facility];  Service: General;;  . SHOULDER OPEN ROTATOR CUFF REPAIR    . TONSILLECTOMY AND ADENOIDECTOMY    . TOTAL ABDOMINAL HYSTERECTOMY W/ BILATERAL SALPINGOOPHORECTOMY     Social History:   reports that she has quit smoking. Her smoking use included Cigarettes. She has never used smokeless tobacco. She reports that she does not drink alcohol or use drugs.  Family History  Problem Relation Age of Onset  . Stroke Mother   . Migraines Mother   . Stomach cancer Father   . Sjogren's syndrome Father   . Sjogren's syndrome Brother   . Sjogren's syndrome Brother   . Scleroderma Sister     raynaud's scleroderma  . Lymphoma Daughter   . Migraines Daughter   . Heart disease      maternal side  . Stroke      maternal side, 1st degree female <60    Medications:   Medication List       Accurate as of 02/14/16  3:27 PM. Always use your most recent med list.          apixaban 5 MG Tabs tablet Commonly known as:  ELIQUIS Take 1 tablet (5 mg total) by mouth 2 (two) times daily.   cyclobenzaprine 5 MG tablet Commonly known as:  FLEXERIL Take 1 tablet (5 mg total) by mouth at bedtime.     diltiazem 180 MG 24 hr capsule Commonly known as:  CARDIZEM CD Take 1 capsule (180 mg total) by mouth daily.   docusate sodium 100 MG capsule Commonly known as:  COLACE Take 100 mg by mouth daily.   dorzolamide 2 % ophthalmic solution Commonly known as:  TRUSOPT Place 1 drop into both eyes 2 (two) times daily.   fenofibrate 145 MG tablet Commonly known as:  TRICOR Take 145 mg by mouth daily.   metFORMIN 500 MG 24 hr tablet Commonly known as:  GLUCOPHAGE-XR Take 500 mg by mouth every evening.   omeprazole-sodium bicarbonate 40-1100 MG capsule Commonly known as:  ZEGERID Take 1 capsule by mouth daily before breakfast.   ondansetron 4 MG tablet Commonly known as:  ZOFRAN Take 4 mg by mouth every 8 (eight) hours as needed for nausea or vomiting.   pregabalin 150 MG capsule Commonly  known as:  LYRICA Take 150-300 mg by mouth 2 (two) times daily. Pt takes one capsule every morning and two at bedtime.   promethazine 25 MG tablet Commonly known as:  PHENERGAN Take 12.5 mg by mouth every 6 (six) hours as needed for nausea or vomiting.   timolol 0.5 % ophthalmic solution Commonly known as:  BETIMOL Place 1 drop into both eyes 2 (two) times daily.   traMADol 50 MG tablet Commonly known as:  ULTRAM Take 1-2 tablets (50-100 mg total) by mouth every 6 (six) hours as needed for moderate pain or severe pain.   traZODone 50 MG tablet Commonly known as:  DESYREL Take 25-50 mg by mouth at bedtime as needed for sleep.       Immunizations: Immunization History  Administered Date(s) Administered  . Influenza Whole 01/14/2007  . Pneumococcal Polysaccharide-23 06/17/2002, 12/08/2012  . Td 06/17/2002     Physical Exam: Vitals:   02/14/16 1522  BP: 115/64  Pulse: 96  Resp: (!) 22  Temp: (!) 96.6 F (35.9 C)  TempSrc: Oral  SpO2: 92%  Weight: 175 lb 3.2 oz (79.5 kg)  Height: 5\' 1"  (1.549 m)   Body mass index is 33.1 kg/m.  General- elderly female, obese, in no acute  distress Head- normocephalic, atraumatic Nose- no maxillary or frontal sinus tenderness, no nasal discharge Throat- moist mucus membrane Eyes- no pallor, no icterus, no discharge, normal conjunctiva, normal sclera Neck- no cervical lymphadenopathy Cardiovascular- normal s1,s2, no murmur Respiratory- bilateral clear to auscultation, no wheeze, no rhonchi, no crackles, no use of accessory muscles Abdomen- bowel sounds present, soft, non tender Musculoskeletal- able to move all 4 extremities, generalized weakness Neurological- alert and oriented to person, place and time Skin- warm and dry, easy bruising, surgical incision to abdominal wall healing well Psychiatry- normal mood and affect    Labs reviewed: Basic Metabolic Panel:  Recent Labs  02/09/16 0503 02/10/16 0527 02/12/16 0713  NA 128* 131* 131*  K 3.8 4.0 3.8  CL 95* 97* 94*  CO2 28 28 29   GLUCOSE 124* 118* 122*  BUN 13 12 14   CREATININE 0.64 0.58 0.65  CALCIUM 9.2 9.5 9.7   Liver Function Tests: No results for input(s): AST, ALT, ALKPHOS, BILITOT, PROT, ALBUMIN in the last 8760 hours. No results for input(s): LIPASE, AMYLASE in the last 8760 hours. No results for input(s): AMMONIA in the last 8760 hours. CBC:  Recent Labs  02/09/16 0503 02/10/16 0527 02/12/16 0713  WBC 13.7* 12.1* 13.2*  HGB 9.3* 9.0* 9.8*  HCT 29.9* 29.0* 31.1*  MCV 78.1 78.6 77.4*  PLT 225 238 302   Cardiac Enzymes: No results for input(s): CKTOTAL, CKMB, CKMBINDEX, TROPONINI in the last 8760 hours. BNP: Invalid input(s): POCBNP CBG:  Recent Labs  02/13/16 1124 02/13/16 1639 02/13/16 1943  GLUCAP 135* 108* 162*    Radiological Exams: Dg Cystogram  Result Date: 02/13/2016 CLINICAL DATA:  [place] post repair of colovesicular fistula. Evaluate for leak after surgery. EXAM: CYSTOGRAM TECHNIQUE: After catheterization of the urinary bladder following sterile technique the bladder was filled with 250 mL Cysto-Hypaque 30% by drip  infusion. This was maximal capacity, with further filling limited by patient discomfort and leakage through the urethra. Serial spot images were obtained during bladder filling and post draining. FLUOROSCOPY TIME:  Fluoroscopy Time:  1 minutes 6 seconds Radiation Exposure Index (if provided by the fluoroscopic device): 44.1 mGy air, Number of Acquired Spot Images: 0 COMPARISON:  CT 01/01/2016 FINDINGS: Irregularity of the left  bladder dome at site of recent surgery, expected. A Foley catheter tip also distorts this area. No leak or collection. No vesicoureteral reflux. Incomplete drainage of the bladder on post drainage image. IMPRESSION: Postoperative bladder without leak. Electronically Signed   By: Monte Fantasia M.D.   On: 02/13/2016 15:42    Assessment/Plan  Physical deconditioning Will have her work with physical therapy and occupational therapy team to help with gait training and muscle strengthening exercises.fall precautions. Skin care. Encourage to be out of bed.   colovesical fistula  s/p robotic colectomy and bladder closure on 02/07/16. To follow up with surgery. Monitor for pain. Continue colace Blood loss anemia  Hyponatremia  Monitor bmp  Leukocytosis Aferile, monitor wbc and temp curve  afib Rate controlled continue eliquis for stroke prophylaxis. Continue diltiazem for rate control  Anemia of chronic disease Monitor cbc  Neuropathic pain Continue pregabalin and monitr  gerd Stable, continue zegerid  Dm type 2 Lab Results  Component Value Date   HGBA1C 5.7 (H) 02/01/2016   Monitor cbg, continue metformin  Osteoarthritis Continue tramadol 50 mg 1-2 tab q6h prn pain    Goals of care: short term rehabilitation   Labs/tests ordered: cbc, bmp  Family/ staff Communication: reviewed care plan with patient and nursing supervisor    Blanchie Serve, MD Internal Medicine [place], [place]  [address] Cell Phone (Monday-Friday 8 am - 5 pm): [phone] On Call: [phone] and follow prompts after 5 pm and on weekends Office Phone: [phone] Office Fax: [phone]

## 2016-02-19 LAB — BASIC METABOLIC PANEL
BUN: 7 mg/dL (ref 4–21)
Creatinine: 0.6 mg/dL (ref 0.5–1.1)
Glucose: 127 mg/dL
Potassium: 4.3 mmol/L (ref 3.4–5.3)
Sodium: 135 mmol/L — AB (ref 137–147)

## 2016-02-19 LAB — CBC AND DIFFERENTIAL
HEMATOCRIT: 33 % — AB (ref 36–46)
Hemoglobin: 9.8 g/dL — AB (ref 12.0–16.0)
NEUTROS ABS: 8 /uL
PLATELETS: 428 10*3/uL — AB (ref 150–399)
WBC: 11.4 10^3/mL

## 2016-02-19 LAB — HEPATIC FUNCTION PANEL
ALK PHOS: 50 U/L (ref 25–125)
ALT: 13 U/L (ref 7–35)
AST: 15 U/L (ref 13–35)
BILIRUBIN, TOTAL: 0.4 mg/dL

## 2016-02-21 ENCOUNTER — Encounter: Payer: Self-pay | Admitting: Adult Health

## 2016-02-21 ENCOUNTER — Non-Acute Institutional Stay (SKILLED_NURSING_FACILITY): Payer: Medicare Other | Admitting: Adult Health

## 2016-02-21 DIAGNOSIS — K219 Gastro-esophageal reflux disease without esophagitis: Secondary | ICD-10-CM | POA: Diagnosis not present

## 2016-02-21 DIAGNOSIS — M792 Neuralgia and neuritis, unspecified: Secondary | ICD-10-CM

## 2016-02-21 DIAGNOSIS — D62 Acute posthemorrhagic anemia: Secondary | ICD-10-CM | POA: Diagnosis not present

## 2016-02-21 DIAGNOSIS — G4700 Insomnia, unspecified: Secondary | ICD-10-CM | POA: Diagnosis not present

## 2016-02-21 DIAGNOSIS — R5381 Other malaise: Secondary | ICD-10-CM | POA: Diagnosis not present

## 2016-02-21 DIAGNOSIS — E785 Hyperlipidemia, unspecified: Secondary | ICD-10-CM

## 2016-02-21 DIAGNOSIS — K5901 Slow transit constipation: Secondary | ICD-10-CM | POA: Diagnosis not present

## 2016-02-21 DIAGNOSIS — N321 Vesicointestinal fistula: Secondary | ICD-10-CM | POA: Diagnosis not present

## 2016-02-21 DIAGNOSIS — I4891 Unspecified atrial fibrillation: Secondary | ICD-10-CM

## 2016-02-21 DIAGNOSIS — H409 Unspecified glaucoma: Secondary | ICD-10-CM | POA: Diagnosis not present

## 2016-02-21 DIAGNOSIS — E119 Type 2 diabetes mellitus without complications: Secondary | ICD-10-CM

## 2016-02-21 NOTE — Progress Notes (Signed)
DATE:  02/21/2016   MRN:  [id number]  BIRTHDAY: 06-29-1935  Facility:  Nursing Home Location:  [address] [place] [facility] [place] Room Number: 807-P  LEVEL OF CARE:  SNF [phone])  Contact Information    Name Relation Home Work [place] [phone]  [phone]   [place] Daughter   [phone]       Code Status History    Date Active Date Inactive Code Status Order ID Comments User Context   02/07/2016  3:09 PM 02/14/2016 12:16 AM Full Code [id number]  Leighton Ruff, MD Inpatient   12/06/2012 10:58 PM 12/10/2012  2:16 PM Full Code [id number]  Dominic Pea, DO Inpatient       Chief Complaint  Patient presents with  . Discharge Note    HISTORY OF PRESENT ILLNESS:  This is a 80 year old female who is for discharge home with home health PT, OT, Nursing and CNA.  She has been admitted to [facility] on 02/13/16 from [facility] hospitalization 02/07/16 through 02/13/16. She has PMH of hyperlipidemia, hypertension, GERD, atrial fibrillation and neuropathy. She had colovesical fistula for which she had robotic colectomy and bladder closure on 02/07/16.  Patient was admitted to this facility for short-term rehabilitation after the patient's recent hospitalization.  Patient has completed SNF rehabilitation and therapy has cleared the patient for discharge.   PAST MEDICAL HISTORY:  Past Medical History:  Diagnosis Date  . Acute respiratory failure with hypoxia ([place]) 12/06/2012  . Allergy   . Blindness of right eye   . Complication of anesthesia   . Dehydration with hyponatremia 12/07/2012  . Diabetes mellitus without complication ([place])    diagnosed 2 weeks ago- No CBG meter as of yet.  Marland Kitchen DIVERTICULITIS, HX OF 01/27/2009   Qualifier: Diagnosis of  By: Jerold Coombe    . Gait disorder 06/05/2012  . GERD (gastroesophageal reflux disease)   . Glaucoma   . Glucose intolerance (impaired glucose tolerance)   . History of  diverticulitis   . Hypertension   . Legally blind   . Migraine triggered seizures ([place])    history of  . Migraines   . Neuropathy ([place])   . OP (osteoporosis)   . Osteoarthritis    osteoarthritis -right knee. uses walker. Left shoulder -"Limited ROM"  . Paroxysmal atrial fibrillation ([place]) 12/06/2012   Echo (12/07/12): Moderate focal basal hypertrophy of the septum, vigorous LV function, EF 65-70%, indeterminate diastolic function, normal wall motion, trivial MR, moderate LAE, trivial TR;  Xarelto started 11/2012  . Peripheral neuropathy (HCC)    neuropathy  . Pneumonia   . PONV (postoperative nausea and vomiting)    no problems other past few surgeries  . SHINGLES, HX OF 07/17/2009   Qualifier: Diagnosis of  By: Jerold Coombe    . Siriasis   . Sjogren's syndrome ([place])    bilateral vision  . Sleep apnea    does not wear CPAP  . Stroke [facility])    residual - rare Intermittent expressive aphasia,     CURRENT MEDICATIONS: Reviewed  Patient's Medications  New Prescriptions   No medications on file  Previous Medications   APIXABAN (ELIQUIS) 5 MG TABS TABLET    Take 1 tablet (5 mg total) by mouth 2 (two) times daily.   CYCLOBENZAPRINE (FLEXERIL) 5 MG TABLET    Take 1 tablet (5 mg total) by mouth at bedtime.   DILTIAZEM (CARDIZEM CD) 180 MG 24 HR CAPSULE  Take 1 capsule (180 mg total) by mouth daily.   DOCUSATE SODIUM (COLACE) 100 MG CAPSULE    Take 100 mg by mouth at bedtime.    DORZOLAMIDE (TRUSOPT) 2 % OPHTHALMIC SOLUTION    Place 1 drop into both eyes 2 (two) times daily.   FENOFIBRATE (TRICOR) 145 MG TABLET    Take 145 mg by mouth at bedtime.    METFORMIN (GLUCOPHAGE-XR) 500 MG 24 HR TABLET    Take 500 mg by mouth every evening.    OMEPRAZOLE-SODIUM BICARBONATE (ZEGERID) 40-1100 MG CAPSULE    Take 1 capsule by mouth daily before breakfast.   ONDANSETRON (ZOFRAN) 4 MG TABLET    Take 4 mg by mouth every 8 (eight) hours as needed for nausea or vomiting.   PREGABALIN (LYRICA) 150 MG  CAPSULE    Take 150-300 mg by mouth 2 (two) times daily. Pt takes one capsule every morning and two at bedtime.   PROMETHAZINE (PHENERGAN) 25 MG TABLET    Take 12.5 mg by mouth every 6 (six) hours as needed for nausea or vomiting.    TIMOLOL (BETIMOL) 0.5 % OPHTHALMIC SOLUTION    Place 1 drop into both eyes 2 (two) times daily.    TRAMADOL (ULTRAM) 50 MG TABLET    Take 1-2 tablets (50-100 mg total) by mouth every 6 (six) hours as needed for moderate pain or severe pain.   TRAZODONE (DESYREL) 50 MG TABLET    Take 25-50 mg by mouth at bedtime as needed for sleep.  Modified Medications   No medications on file  Discontinued Medications   No medications on file     Allergies  Allergen Reactions  . Rivaroxaban Other (See Comments)    Reaction:  Back pain/headaches   . Codeine Nausea And Vomiting  . Hydrocodone Nausea And Vomiting  . Morphine And Related Nausea And Vomiting  . Oxycodone Nausea And Vomiting  . [place]oc[place]ine Other (See Comments)    Reaction:  Unknown      REVIEW OF SYSTEMS:  GENERAL: no change in appetite, no fatigue, no weight changes, no fever, chills or weakness EYES: Denies change in vision, dry eyes, eye pain, itching or discharge EARS: Denies change in hearing, ringing in ears, or earache NOSE: Denies nasal congestion or epistaxis MOUTH and THROAT: Denies oral discomfort, gingival pain or bleeding, pain from teeth or hoarseness   RESPIRATORY: no cough, SOB, DOE, wheezing, hemoptysis CARDIAC: no chest pain, edema or palpitations GI: no abdominal pain, diarrhea, constipation, heart burn, nausea or vomiting GU: Denies dysuria, frequency, hematuria, incontinence, or discharge PSYCHIATRIC: Denies feeling of depression or anxiety. No report of hallucinations, insomnia, paranoia, or agitation    PHYSICAL EXAMINATION  GENERAL APPEARANCE: Well nourished. In no acute distress. Obese SKIN:  Has 5 surgical wounds on abdomen, dry, healing HEAD: Normal in size and contour.  No evidence of trauma EYES: Lids open and close normally. No blepharitis, entropion or ectropion. PERRL. Conjunctivae are clear and sclerae are white. Lenses are without opacity EARS: Pinnae are normal. Patient hears normal voice tunes of the examiner MOUTH and THROAT: Lips are without lesions. Oral mucosa is moist and without lesions. Tongue is normal in shape, size, and color and without lesions NECK: supple, trachea midline, no neck masses, no thyroid tenderness, no thyromegaly LYMPHATICS: no LAN in the neck, no supraclavicular LAN RESPIRATORY: breathing is even & unlabored, BS CTAB CARDIAC: RRR, no murmur,no extra heart sounds, no edema GI: abdomen soft, normal BS, no masses, no tenderness, no hepatomegaly,  no splenomegaly EXTREMITIES:  Able to move X 4 extremities PSYCHIATRIC: Alert and oriented X 3. Affect and behavior are appropriate  LABS/RADIOLOGY: Labs reviewed: Basic Metabolic Panel:  Recent Labs  02/09/16 0503 02/10/16 0527 02/12/16 0713 02/19/16  NA 128* 131* 131* 135*  K 3.8 4.0 3.8 4.3  CL 95* 97* 94*  --   CO2 28 28 29   --   GLUCOSE 124* 118* 122*  --   BUN 13 12 14 7   CREATININE 0.64 0.58 0.65 0.6  CALCIUM 9.2 9.5 9.7  --    Liver Function Tests:  Recent Labs  02/19/16  AST 15  ALT 13  ALKPHOS 50    CBC:  Recent Labs  02/09/16 0503 02/10/16 0527 02/12/16 0713 02/19/16  WBC 13.7* 12.1* 13.2* 11.4  NEUTROABS  --   --   --  8  HGB 9.3* 9.0* 9.8* 9.8*  HCT 29.9* 29.0* 31.1* 33*  MCV 78.1 78.6 77.4*  --   PLT 225 238 302 428*   CBG:  Recent Labs  02/13/16 1124 02/13/16 1639 02/13/16 1943  GLUCAP 135* 108* 162*      Dg Cystogram  Result Date: 02/13/2016 CLINICAL DATA:  [place] post repair of colovesicular fistula. Evaluate for leak after surgery. EXAM: CYSTOGRAM TECHNIQUE: After catheterization of the urinary bladder following sterile technique the bladder was filled with 250 mL Cysto-Hypaque 30% by drip infusion. This was maximal capacity,  with further filling limited by patient discomfort and leakage through the urethra. Serial spot images were obtained during bladder filling and post draining. FLUOROSCOPY TIME:  Fluoroscopy Time:  1 minutes 6 seconds Radiation Exposure Index (if provided by the fluoroscopic device): 44.1 mGy air, Number of Acquired Spot Images: 0 COMPARISON:  CT 01/01/2016 FINDINGS: Irregularity of the left bladder dome at site of recent surgery, expected. A Foley catheter tip also distorts this area. No leak or collection. No vesicoureteral reflux. Incomplete drainage of the bladder on post drainage image. IMPRESSION: Postoperative bladder without leak. Electronically Signed   By: Monte Fantasia M.D.   On: 02/13/2016 15:42    ASSESSMENT/PLAN:  Physical deconditioning - for home health PT, OT, nursing and CNA, for therapeutic strengthening exercises; fall precaution  Colovesical fistula S/P colectomy and bladder closure - follow-up with surgery; continue tramadol 50 mg 1-2 tabs by mouth every 6 hours when necessary for pain; cyclobenzaprine 5 mg 1 tab by mouth daily at bedtime for muscle spasm  Anemia, acute blood loss - stable Lab Results  Component Value Date   HGB 9.8 (A) 02/19/2016   Insomnia - continue trazodone 50 mg 1/2-1 tab PO Q HS PRN  GERD - stable; continue Zegerid 40-11 100 mg by mouth daily  Atrial fibrillation - rate controlled; continue Eliquis 5 mg 1 tab by mouth twice a day and diltiazem 24-hour CD 180 mg 1 capsule by mouth daily  Glaucoma - no complaints of eye pain; continue Trusopt 2% eyedrops 1 drop into both eyes twice a day and Timolol 0.5% eyedrops 1 drop into both eyes twice a day  Neuropathic pain - stable; continue Lyrica 150 mg 1 capsule by mouth daily and 2 capsules by mouth daily at bedtime  Constipation - continue Colace 100 mg 1 capsule by mouth daily at bedtime  Hyperlipidemia - continue TriCor 145 mg 1 tablet by mouth daily at bedtime  Diabetes mellitus, type II  -continue Glucophage XR 500 mg 1 tab by mouth daily at bedtime Lab Results  Component Value Date   HGBA1C  5.7 (H) 02/01/2016       I have filled out patient's discharge paperwork and written prescriptions.  Patient will receive home health PT, OT, Nursing and CNA.  DME provided:  None  Total discharge time: Greater than 30 minutes Greater than 50% was spent in counseling and coordination of care with the patient.   Discharge time involved coordination of the discharge process with social worker, nursing staff and therapy department. Medical justification for home health services verified.    Danile Trier C. [place] - NP [employer] [phone]

## 2016-02-21 NOTE — Progress Notes (Deleted)
HPI: FU atrial fibrillation. She has a hx of Sjogren's syndrome, seizure disorder, HTN, HL, peripheral neuropathy. She was admitted 9/14 after presenting with overall functional decline, cough and generalized weakness and malaise. She was found to be in atrial fibrillation with RVR. CHADS2-VASc=4. She was placed on Xarelto for anticoagulation. Echo (12/07/12): Moderate focal basal hypertrophy of the septum, vigorous LV function, EF 65-70%, indeterminate diastolic function, normal wall motion, trivial MR, moderate LAE, trivial TR. She converted to sinus rhythm. MRI in October of 2014 showed a very small acute/subacute left thalamic infarct. Patient found to be in recurrent atrial fibrillation 8/16. Nuclear study 8/16 showed EF 65 and no ischemia. Echo 8/16 showed normal LV function. Patient declined cardioversion previously and her amiodarone was discontinued. Holter monitor February 2017 showed atrial fibrillation with PVCs or aberrantly conducted beats rate controlled. Showed 1-39% bilateral stenosis. Since last seen,   Current Outpatient Prescriptions  Medication Sig Dispense Refill  . apixaban (ELIQUIS) 5 MG TABS tablet Take 1 tablet (5 mg total) by mouth 2 (two) times daily. 180 tablet 2  . cyclobenzaprine (FLEXERIL) 5 MG tablet Take 1 tablet (5 mg total) by mouth at bedtime. 90 tablet 3  . diltiazem (CARDIZEM CD) 180 MG 24 hr capsule Take 1 capsule (180 mg total) by mouth daily. 90 capsule 3  . docusate sodium (COLACE) 100 MG capsule Take 100 mg by mouth daily.    . dorzolamide (TRUSOPT) 2 % ophthalmic solution Place 1 drop into both eyes 2 (two) times daily.    . fenofibrate (TRICOR) 145 MG tablet Take 145 mg by mouth daily.   0  . metFORMIN (GLUCOPHAGE-XR) 500 MG 24 hr tablet Take 500 mg by mouth every evening.    Marland Kitchen omeprazole-sodium bicarbonate (ZEGERID) 40-1100 MG capsule Take 1 capsule by mouth daily before breakfast.    . ondansetron (ZOFRAN) 4 MG tablet Take 4 mg by mouth every 8  (eight) hours as needed for nausea or vomiting.    . pregabalin (LYRICA) 150 MG capsule Take 150-300 mg by mouth 2 (two) times daily. Pt takes one capsule every morning and two at bedtime.    . promethazine (PHENERGAN) 25 MG tablet Take 12.5 mg by mouth every 6 (six) hours as needed for nausea or vomiting.     . timolol (BETIMOL) 0.5 % ophthalmic solution Place 1 drop into both eyes 2 (two) times daily.    . traMADol (ULTRAM) 50 MG tablet Take 1-2 tablets (50-100 mg total) by mouth every 6 (six) hours as needed for moderate pain or severe pain. 30 tablet 0  . traZODone (DESYREL) 50 MG tablet Take 25-50 mg by mouth at bedtime as needed for sleep.     No current facility-administered medications for this visit.      Past Medical History:  Diagnosis Date  . Acute respiratory failure with hypoxia ([place]) 12/06/2012  . Allergy   . Blindness of right eye   . Complication of anesthesia   . Dehydration with hyponatremia 12/07/2012  . Diabetes mellitus without complication ([place])    diagnosed 2 weeks ago- No CBG meter as of yet.  Marland Kitchen DIVERTICULITIS, HX OF 01/27/2009   Qualifier: Diagnosis of  By: Jerold Coombe    . Gait disorder 06/05/2012  . GERD (gastroesophageal reflux disease)   . Glaucoma   . Glucose intolerance (impaired glucose tolerance)   . History of diverticulitis   . Hypertension   . Legally blind   . Migraine triggered seizures ([place])  history of  . Migraines   . Neuropathy ([place])   . OP (osteoporosis)   . Osteoarthritis    osteoarthritis -right knee. uses walker. Left shoulder -"Limited ROM"  . Paroxysmal atrial fibrillation ([place]) 12/06/2012   Echo (12/07/12): Moderate focal basal hypertrophy of the septum, vigorous LV function, EF 65-70%, indeterminate diastolic function, normal wall motion, trivial MR, moderate LAE, trivial TR;  Xarelto started 11/2012  . Peripheral neuropathy (HCC)    neuropathy  . Pneumonia   . PONV (postoperative nausea and vomiting)    no problems other  past few surgeries  . SHINGLES, HX OF 07/17/2009   Qualifier: Diagnosis of  By: Jerold Coombe    . Siriasis   . Sjogren's syndrome ([place])    bilateral vision  . Sleep apnea    does not wear CPAP  . Stroke [facility])    residual - rare Intermittent expressive aphasia,    Past Surgical History:  Procedure Laterality Date  . BONE TUMOR EXCISION Right    arm  . CATARACT EXTRACTION    . COLONOSCOPY WITH PROPOFOL N/A 06/09/2015   Procedure: COLONOSCOPY WITH PROPOFOL;  Surgeon: Ronald Lobo, MD;  Location: [place];  Service: Endoscopy;  Laterality: N/A;  . DILATION AND CURETTAGE OF UTERUS    . ESOPHAGOGASTRODUODENOSCOPY (EGD) WITH PROPOFOL N/A 06/09/2015   Procedure: ESOPHAGOGASTRODUODENOSCOPY (EGD) WITH PROPOFOL;  Surgeon: Ronald Lobo, MD;  Location: [facility];  Service: Endoscopy;  Laterality: N/A;  . EYE SURGERY    . FOOT FRACTURE SURGERY Left 2007  . KNEE ARTHROSCOPY Right   . LUMBAR LAMINECTOMY    . ROBOT ASSISTED LAPAROSCOPIC PARTIAL COLECTOMY  02/07/2016   Procedure: ROBOT ASSISTED LAPAROSCOPIC PARTIAL COLECTOMY;  Surgeon: Leighton Ruff, MD;  Location: [facility];  Service: General;;  . SHOULDER OPEN ROTATOR CUFF REPAIR    . TONSILLECTOMY AND ADENOIDECTOMY    . TOTAL ABDOMINAL HYSTERECTOMY W/ BILATERAL SALPINGOOPHORECTOMY      Social History   Social History  . Marital status: Married    Spouse name: jesse  . Number of children: 2  . Years of education: college   Occupational History  . retired    Social History Main Topics  . Smoking status: Former Smoker    Types: Cigarettes  . Smokeless tobacco: Never Used     Comment: quit smoking before age of 31  . Alcohol use No  . Drug use: No  . Sexual activity: Not on file   Other Topics Concern  . Not on file   Social History Narrative   She lives with husband of 55 years.  They have a one story home.    Family History  Problem Relation Age of Onset  . Stroke Mother   . Migraines Mother   . Stomach cancer  Father   . Sjogren's syndrome Father   . Sjogren's syndrome Brother   . Sjogren's syndrome Brother   . Scleroderma Sister     raynaud's scleroderma  . Lymphoma Daughter   . Migraines Daughter   . Heart disease      maternal side  . Stroke      maternal side, 1st degree female <60    ROS: no fevers or chills, productive cough, hemoptysis, dysphasia, odynophagia, melena, hematochezia, dysuria, hematuria, rash, seizure activity, orthopnea, PND, pedal edema, claudication. Remaining systems are negative.  Physical Exam: Well-developed well-nourished in no acute distress.  Skin is warm and dry.  HEENT is normal.  Neck is supple.  Chest is clear to auscultation  with normal expansion.  Cardiovascular exam is regular rate and rhythm.  Abdominal exam nontender or distended. No masses palpated. Extremities show no edema. neuro grossly intact  ECG

## 2016-02-23 ENCOUNTER — Telehealth: Payer: Self-pay | Admitting: Cardiology

## 2016-02-23 NOTE — Telephone Encounter (Signed)
New Message  Pt voiced needing someone to give her a call.  Please f/u

## 2016-02-23 NOTE — Telephone Encounter (Signed)
Spoke with pt/husband she states that she just got out of the hospital after being there for 3 weeks and is unable to come to her appt on Monday 02-26-16. Rescheduled appt with Meng,PA for 03-08-16 @11am . They did not want to come in any sooner.

## 2016-02-25 DIAGNOSIS — E669 Obesity, unspecified: Secondary | ICD-10-CM | POA: Diagnosis not present

## 2016-02-25 DIAGNOSIS — K219 Gastro-esophageal reflux disease without esophagitis: Secondary | ICD-10-CM | POA: Diagnosis not present

## 2016-02-25 DIAGNOSIS — E1142 Type 2 diabetes mellitus with diabetic polyneuropathy: Secondary | ICD-10-CM | POA: Diagnosis not present

## 2016-02-25 DIAGNOSIS — E785 Hyperlipidemia, unspecified: Secondary | ICD-10-CM | POA: Diagnosis not present

## 2016-02-25 DIAGNOSIS — Z48815 Encounter for surgical aftercare following surgery on the digestive system: Secondary | ICD-10-CM | POA: Diagnosis not present

## 2016-02-25 DIAGNOSIS — I10 Essential (primary) hypertension: Secondary | ICD-10-CM | POA: Diagnosis not present

## 2016-02-25 DIAGNOSIS — D638 Anemia in other chronic diseases classified elsewhere: Secondary | ICD-10-CM | POA: Diagnosis not present

## 2016-02-25 DIAGNOSIS — Z7984 Long term (current) use of oral hypoglycemic drugs: Secondary | ICD-10-CM | POA: Diagnosis not present

## 2016-02-25 DIAGNOSIS — I4891 Unspecified atrial fibrillation: Secondary | ICD-10-CM | POA: Diagnosis not present

## 2016-02-25 DIAGNOSIS — G40909 Epilepsy, unspecified, not intractable, without status epilepticus: Secondary | ICD-10-CM | POA: Diagnosis not present

## 2016-02-25 DIAGNOSIS — F419 Anxiety disorder, unspecified: Secondary | ICD-10-CM | POA: Diagnosis not present

## 2016-02-26 ENCOUNTER — Ambulatory Visit: Payer: Medicare Other | Admitting: Cardiology

## 2016-02-27 DIAGNOSIS — D638 Anemia in other chronic diseases classified elsewhere: Secondary | ICD-10-CM | POA: Diagnosis not present

## 2016-02-27 DIAGNOSIS — I4891 Unspecified atrial fibrillation: Secondary | ICD-10-CM | POA: Diagnosis not present

## 2016-02-27 DIAGNOSIS — I10 Essential (primary) hypertension: Secondary | ICD-10-CM | POA: Diagnosis not present

## 2016-02-27 DIAGNOSIS — E1142 Type 2 diabetes mellitus with diabetic polyneuropathy: Secondary | ICD-10-CM | POA: Diagnosis not present

## 2016-02-27 DIAGNOSIS — Z48815 Encounter for surgical aftercare following surgery on the digestive system: Secondary | ICD-10-CM | POA: Diagnosis not present

## 2016-02-27 DIAGNOSIS — G40909 Epilepsy, unspecified, not intractable, without status epilepticus: Secondary | ICD-10-CM | POA: Diagnosis not present

## 2016-02-28 DIAGNOSIS — I10 Essential (primary) hypertension: Secondary | ICD-10-CM | POA: Diagnosis not present

## 2016-02-28 DIAGNOSIS — G40909 Epilepsy, unspecified, not intractable, without status epilepticus: Secondary | ICD-10-CM | POA: Diagnosis not present

## 2016-02-28 DIAGNOSIS — E1142 Type 2 diabetes mellitus with diabetic polyneuropathy: Secondary | ICD-10-CM | POA: Diagnosis not present

## 2016-02-28 DIAGNOSIS — I4891 Unspecified atrial fibrillation: Secondary | ICD-10-CM | POA: Diagnosis not present

## 2016-02-28 DIAGNOSIS — D638 Anemia in other chronic diseases classified elsewhere: Secondary | ICD-10-CM | POA: Diagnosis not present

## 2016-02-28 DIAGNOSIS — Z48815 Encounter for surgical aftercare following surgery on the digestive system: Secondary | ICD-10-CM | POA: Diagnosis not present

## 2016-02-29 DIAGNOSIS — D638 Anemia in other chronic diseases classified elsewhere: Secondary | ICD-10-CM | POA: Diagnosis not present

## 2016-02-29 DIAGNOSIS — E1142 Type 2 diabetes mellitus with diabetic polyneuropathy: Secondary | ICD-10-CM | POA: Diagnosis not present

## 2016-02-29 DIAGNOSIS — Z48815 Encounter for surgical aftercare following surgery on the digestive system: Secondary | ICD-10-CM | POA: Diagnosis not present

## 2016-02-29 DIAGNOSIS — G40909 Epilepsy, unspecified, not intractable, without status epilepticus: Secondary | ICD-10-CM | POA: Diagnosis not present

## 2016-02-29 DIAGNOSIS — I4891 Unspecified atrial fibrillation: Secondary | ICD-10-CM | POA: Diagnosis not present

## 2016-02-29 DIAGNOSIS — I10 Essential (primary) hypertension: Secondary | ICD-10-CM | POA: Diagnosis not present

## 2016-03-01 DIAGNOSIS — I4891 Unspecified atrial fibrillation: Secondary | ICD-10-CM | POA: Diagnosis not present

## 2016-03-01 DIAGNOSIS — I10 Essential (primary) hypertension: Secondary | ICD-10-CM | POA: Diagnosis not present

## 2016-03-01 DIAGNOSIS — D638 Anemia in other chronic diseases classified elsewhere: Secondary | ICD-10-CM | POA: Diagnosis not present

## 2016-03-01 DIAGNOSIS — G40909 Epilepsy, unspecified, not intractable, without status epilepticus: Secondary | ICD-10-CM | POA: Diagnosis not present

## 2016-03-01 DIAGNOSIS — E1142 Type 2 diabetes mellitus with diabetic polyneuropathy: Secondary | ICD-10-CM | POA: Diagnosis not present

## 2016-03-01 DIAGNOSIS — Z48815 Encounter for surgical aftercare following surgery on the digestive system: Secondary | ICD-10-CM | POA: Diagnosis not present

## 2016-03-04 DIAGNOSIS — G40909 Epilepsy, unspecified, not intractable, without status epilepticus: Secondary | ICD-10-CM | POA: Diagnosis not present

## 2016-03-04 DIAGNOSIS — E1142 Type 2 diabetes mellitus with diabetic polyneuropathy: Secondary | ICD-10-CM | POA: Diagnosis not present

## 2016-03-04 DIAGNOSIS — I4891 Unspecified atrial fibrillation: Secondary | ICD-10-CM | POA: Diagnosis not present

## 2016-03-04 DIAGNOSIS — I10 Essential (primary) hypertension: Secondary | ICD-10-CM | POA: Diagnosis not present

## 2016-03-04 DIAGNOSIS — Z48815 Encounter for surgical aftercare following surgery on the digestive system: Secondary | ICD-10-CM | POA: Diagnosis not present

## 2016-03-04 DIAGNOSIS — D638 Anemia in other chronic diseases classified elsewhere: Secondary | ICD-10-CM | POA: Diagnosis not present

## 2016-03-05 DIAGNOSIS — D638 Anemia in other chronic diseases classified elsewhere: Secondary | ICD-10-CM | POA: Diagnosis not present

## 2016-03-05 DIAGNOSIS — E1142 Type 2 diabetes mellitus with diabetic polyneuropathy: Secondary | ICD-10-CM | POA: Diagnosis not present

## 2016-03-05 DIAGNOSIS — Z48815 Encounter for surgical aftercare following surgery on the digestive system: Secondary | ICD-10-CM | POA: Diagnosis not present

## 2016-03-05 DIAGNOSIS — I10 Essential (primary) hypertension: Secondary | ICD-10-CM | POA: Diagnosis not present

## 2016-03-05 DIAGNOSIS — G40909 Epilepsy, unspecified, not intractable, without status epilepticus: Secondary | ICD-10-CM | POA: Diagnosis not present

## 2016-03-05 DIAGNOSIS — I4891 Unspecified atrial fibrillation: Secondary | ICD-10-CM | POA: Diagnosis not present

## 2016-03-06 DIAGNOSIS — G40909 Epilepsy, unspecified, not intractable, without status epilepticus: Secondary | ICD-10-CM | POA: Diagnosis not present

## 2016-03-06 DIAGNOSIS — E1142 Type 2 diabetes mellitus with diabetic polyneuropathy: Secondary | ICD-10-CM | POA: Diagnosis not present

## 2016-03-06 DIAGNOSIS — Z48815 Encounter for surgical aftercare following surgery on the digestive system: Secondary | ICD-10-CM | POA: Diagnosis not present

## 2016-03-06 DIAGNOSIS — D638 Anemia in other chronic diseases classified elsewhere: Secondary | ICD-10-CM | POA: Diagnosis not present

## 2016-03-06 DIAGNOSIS — I4891 Unspecified atrial fibrillation: Secondary | ICD-10-CM | POA: Diagnosis not present

## 2016-03-06 DIAGNOSIS — I10 Essential (primary) hypertension: Secondary | ICD-10-CM | POA: Diagnosis not present

## 2016-03-07 ENCOUNTER — Other Ambulatory Visit: Payer: Self-pay | Admitting: Cardiology

## 2016-03-07 DIAGNOSIS — G40909 Epilepsy, unspecified, not intractable, without status epilepticus: Secondary | ICD-10-CM | POA: Diagnosis not present

## 2016-03-07 DIAGNOSIS — E1142 Type 2 diabetes mellitus with diabetic polyneuropathy: Secondary | ICD-10-CM | POA: Diagnosis not present

## 2016-03-07 DIAGNOSIS — Z48815 Encounter for surgical aftercare following surgery on the digestive system: Secondary | ICD-10-CM | POA: Diagnosis not present

## 2016-03-07 DIAGNOSIS — I10 Essential (primary) hypertension: Secondary | ICD-10-CM | POA: Diagnosis not present

## 2016-03-07 DIAGNOSIS — D638 Anemia in other chronic diseases classified elsewhere: Secondary | ICD-10-CM | POA: Diagnosis not present

## 2016-03-07 DIAGNOSIS — I4891 Unspecified atrial fibrillation: Secondary | ICD-10-CM

## 2016-03-08 ENCOUNTER — Encounter: Payer: Self-pay | Admitting: Physician Assistant

## 2016-03-08 ENCOUNTER — Ambulatory Visit (INDEPENDENT_AMBULATORY_CARE_PROVIDER_SITE_OTHER): Payer: Medicare Other | Admitting: Physician Assistant

## 2016-03-08 VITALS — BP 118/68 | HR 81 | Ht 61.0 in | Wt 178.0 lb

## 2016-03-08 DIAGNOSIS — I10 Essential (primary) hypertension: Secondary | ICD-10-CM

## 2016-03-08 DIAGNOSIS — I4891 Unspecified atrial fibrillation: Secondary | ICD-10-CM | POA: Diagnosis not present

## 2016-03-08 DIAGNOSIS — E785 Hyperlipidemia, unspecified: Secondary | ICD-10-CM | POA: Diagnosis not present

## 2016-03-08 DIAGNOSIS — E1142 Type 2 diabetes mellitus with diabetic polyneuropathy: Secondary | ICD-10-CM | POA: Diagnosis not present

## 2016-03-08 DIAGNOSIS — R7303 Prediabetes: Secondary | ICD-10-CM

## 2016-03-08 DIAGNOSIS — D638 Anemia in other chronic diseases classified elsewhere: Secondary | ICD-10-CM | POA: Diagnosis not present

## 2016-03-08 DIAGNOSIS — I481 Persistent atrial fibrillation: Secondary | ICD-10-CM | POA: Diagnosis not present

## 2016-03-08 DIAGNOSIS — I4819 Other persistent atrial fibrillation: Secondary | ICD-10-CM

## 2016-03-08 DIAGNOSIS — Z48815 Encounter for surgical aftercare following surgery on the digestive system: Secondary | ICD-10-CM | POA: Diagnosis not present

## 2016-03-08 DIAGNOSIS — D62 Acute posthemorrhagic anemia: Secondary | ICD-10-CM

## 2016-03-08 DIAGNOSIS — G40909 Epilepsy, unspecified, not intractable, without status epilepticus: Secondary | ICD-10-CM | POA: Diagnosis not present

## 2016-03-08 NOTE — Progress Notes (Signed)
Cardiology Office Note    Date:  03/09/2016   ID:  RIAH FELLENZ, DOB Oct 25, 1935, MRN [id number]  PCP:  Vidal Schwalbe, MD  Cardiologist:  Dr. Stanford Breed   Chief Complaint  Patient presents with  . Follow-up    seen for Dr. Stanford Breed, post op followup    History of Present Illness:  Krystal Clarke is a 80 y.o. female with PMH of Sjogren's syndrome, seizure disorder, hypertension, hyperlipidemia, peripheral neuropathy, persistent atrial fibrillation on Eliquis and CVA. She was admitted in September 2014 after presenting with overall functional decline, cough, generalized weakness and malaise. She was found to be in atrial fibrillation and was started on Xarelto. Echo obtained on 12/07/2012 showed moderate focal basal hypertrophy of the septum, vigorous LV function with EF 65-70%, indeterminate diastolic function, normal wall motion, moderate left atrial enlargement. MRI obtained in October 2014 showed very small acute/subacute left thalamic infarct. She was found to be in recurrent atrial fibrillation in August 2016. Myoview obtained during the same mouth showed EF 65% with no ischemia. Echocardiogram obtained in a/16 showed normal EF. She declined a cardioversion previously and her amiodarone was discontinued. Holter monitor obtained in February 2017 showed atrial fibrillation with PVCs or burning conducted beats, however rate controlled. Carotid Doppler obtained in February 2017 showed only 1-39% bilateral stenosis.  She did have an colovesicular fistula that required surgical intervention. She eventually underwent robotic-assisted sigmoidectomy with closure of colovesical fistula. She was admitted for 6 days after her surgery and was eventually released on 02/13/2016. She has not had any further issues since she left the hospital. There is no sign of fluid overload on today's physical exam. She has not had any chest discomfort or shortness of breath. She has been restarted on Xarelto and  has not noticed any bleeding issues. She did have some anemia with hemoglobin 9.8 and appears to be stable on the previous lab. She preferred to have her CBC followed by her primary care physician. Otherwise she is doing quite well from cardiology perspective. She can follow-up in 6 months. She was recently started on metformin a month ago. Her hemoglobin A1c put her in the prediabetes range at 5.7.   Past Medical History:  Diagnosis Date  . Acute respiratory failure with hypoxia ([place]) 12/06/2012  . Allergy   . Blindness of right eye   . Complication of anesthesia   . Dehydration with hyponatremia 12/07/2012  . Diabetes mellitus without complication ([place])    diagnosed 2 weeks ago- No CBG meter as of yet.  Marland Kitchen DIVERTICULITIS, HX OF 01/27/2009   Qualifier: Diagnosis of  By: Jerold Coombe    . Gait disorder 06/05/2012  . GERD (gastroesophageal reflux disease)   . Glaucoma   . Glucose intolerance (impaired glucose tolerance)   . History of diverticulitis   . Hypertension   . Legally blind   . Migraine triggered seizures ([place])    history of  . Migraines   . Neuropathy ([place])   . OP (osteoporosis)   . Osteoarthritis    osteoarthritis -right knee. uses walker. Left shoulder -"Limited ROM"  . Paroxysmal atrial fibrillation ([place]) 12/06/2012   Echo (12/07/12): Moderate focal basal hypertrophy of the septum, vigorous LV function, EF 65-70%, indeterminate diastolic function, normal wall motion, trivial MR, moderate LAE, trivial TR;  Xarelto started 11/2012  . Peripheral neuropathy ([facility])    neuropathy  . Pneumonia   . PONV (postoperative nausea and vomiting)    no problems other past few surgeries  .  SHINGLES, HX OF 07/17/2009   Qualifier: Diagnosis of  By: Jerold Coombe    . Siriasis   . Sjogren's syndrome ([place])    bilateral vision  . Sleep apnea    does not wear CPAP  . Stroke [facility])    residual - rare Intermittent expressive aphasia,    Past Surgical History:  Procedure Laterality Date   . BONE TUMOR EXCISION Right    arm  . CATARACT EXTRACTION    . COLONOSCOPY WITH PROPOFOL N/A 06/09/2015   Procedure: COLONOSCOPY WITH PROPOFOL;  Surgeon: Ronald Lobo, MD;  Location: [facility];  Service: Endoscopy;  Laterality: N/A;  . DILATION AND CURETTAGE OF UTERUS    . ESOPHAGOGASTRODUODENOSCOPY (EGD) WITH PROPOFOL N/A 06/09/2015   Procedure: ESOPHAGOGASTRODUODENOSCOPY (EGD) WITH PROPOFOL;  Surgeon: Ronald Lobo, MD;  Location: [facility];  Service: Endoscopy;  Laterality: N/A;  . EYE SURGERY    . FOOT FRACTURE SURGERY Left 2007  . KNEE ARTHROSCOPY Right   . LUMBAR LAMINECTOMY    . ROBOT ASSISTED LAPAROSCOPIC PARTIAL COLECTOMY  02/07/2016   Procedure: ROBOT ASSISTED LAPAROSCOPIC PARTIAL COLECTOMY;  Surgeon: Leighton Ruff, MD;  Location: [facility];  Service: General;;  . SHOULDER OPEN ROTATOR CUFF REPAIR    . TONSILLECTOMY AND ADENOIDECTOMY    . TOTAL ABDOMINAL HYSTERECTOMY W/ BILATERAL SALPINGOOPHORECTOMY      Current Medications: Outpatient Medications Prior to Visit  Medication Sig Dispense Refill  . apixaban (ELIQUIS) 5 MG TABS tablet Take 1 tablet (5 mg total) by mouth 2 (two) times daily. 180 tablet 2  . cyclobenzaprine (FLEXERIL) 5 MG tablet Take 1 tablet (5 mg total) by mouth at bedtime. 90 tablet 3  . diltiazem (CARDIZEM CD) 180 MG 24 hr capsule TAKE 1 CAPSULE (180 MG TOTAL) BY MOUTH DAILY. 90 capsule 3  . docusate sodium (COLACE) 100 MG capsule Take 100 mg by mouth at bedtime.     . dorzolamide (TRUSOPT) 2 % ophthalmic solution Place 1 drop into both eyes 2 (two) times daily.    . fenofibrate (TRICOR) 145 MG tablet Take 145 mg by mouth at bedtime.   0  . metFORMIN (GLUCOPHAGE-XR) 500 MG 24 hr tablet Take 500 mg by mouth every evening.     Marland Kitchen omeprazole-sodium bicarbonate (ZEGERID) 40-1100 MG capsule Take 1 capsule by mouth daily before breakfast.    . ondansetron (ZOFRAN) 4 MG tablet Take 4 mg by mouth every 8 (eight) hours as needed for nausea or vomiting.    .  pregabalin (LYRICA) 150 MG capsule Take 150-300 mg by mouth 2 (two) times daily. Pt takes one capsule every morning and two at bedtime.    . promethazine (PHENERGAN) 25 MG tablet Take 12.5 mg by mouth every 6 (six) hours as needed for nausea or vomiting.     . timolol (BETIMOL) 0.5 % ophthalmic solution Place 1 drop into both eyes 2 (two) times daily.     . traMADol (ULTRAM) 50 MG tablet Take 1-2 tablets (50-100 mg total) by mouth every 6 (six) hours as needed for moderate pain or severe pain. 30 tablet 0  . traZODone (DESYREL) 50 MG tablet Take 25-50 mg by mouth at bedtime as needed for sleep.     No facility-administered medications prior to visit.      Allergies:   Codeine; Hydrocodone; Morphine and related; Oxycodone; Pilocarpine; and Rivaroxaban   Social History   Social History  . Marital status: Married    Spouse name: jesse  . Number of children: 2  .  Years of education: college   Occupational History  . retired    Social History Main Topics  . Smoking status: Former Smoker    Types: Cigarettes  . Smokeless tobacco: Never Used     Comment: quit smoking before age of 49  . Alcohol use No  . Drug use: No  . Sexual activity: Not Asked   Other Topics Concern  . None   Social History Narrative   She lives with husband of 40 years.  They have a one story home.     Family History:  The patient's family history includes Lymphoma in her daughter; Migraines in her daughter and mother; Scleroderma in her sister; Sjogren's syndrome in her brother, brother, and father; Stomach cancer in her father; Stroke in her mother.   ROS:   Please see the history of present illness.    ROS All other systems reviewed and are negative.   PHYSICAL EXAM:   VS:  BP 118/68 (BP Location: Left Arm, Patient Position: Sitting, Cuff Size: Normal)   Pulse 81   Ht 5\' 1"  (1.549 m)   Wt 178 lb (80.7 kg)   SpO2 98%   BMI 33.63 kg/m    GEN: Well nourished, well developed, in no acute distress    HEENT: normal  Neck: no JVD, carotid bruits, or masses Cardiac: RRR; no murmurs, rubs, or gallops,no edema  Respiratory:  clear to auscultation bilaterally, normal work of breathing GI: soft, nontender, nondistended, + BS MS: no deformity or atrophy  Skin: warm and dry, no rash Neuro:  Alert and Oriented x 3, Strength and sensation are intact Psych: euthymic mood, full affect  Wt Readings from Last 3 Encounters:  03/08/16 178 lb (80.7 kg)  02/21/16 175 lb 3.2 oz (79.5 kg)  02/14/16 175 lb 3.2 oz (79.5 kg)      Studies/Labs Reviewed:   EKG:  EKG is not ordered today.   Recent Labs: 02/19/2016: ALT 13; BUN 7; Creatinine 0.6; Hemoglobin 9.8; Platelets 428; Potassium 4.3; Sodium 135   Lipid Panel    Component Value Date/Time   CHOL 129 07/17/2009 0845   TRIG 128.0 07/17/2009 0845   HDL 47.20 07/17/2009 0845   CHOLHDL 3 07/17/2009 0845   VLDL 25.6 07/17/2009 0845   LDLCALC 56 07/17/2009 0845    Additional studies/ records that were reviewed today include:   Echo 11/16/2014 LV EF: 55% -   60%  - Left ventricle: The cavity size was normal. Systolic function was   normal. The estimated ejection fraction was in the range of 55%   to 60%. Wall motion was normal; there were no regional wall   motion abnormalities.    24 hour holter monitor 05/03/2015 afib with pvcs or aberrantly conducted beats, rate controlled     Carotid Doppler 05/11/2015 Heterogeneous plaque, bilaterally. 1-39% bilateral ICA stenosis. Normal subclavian arteries, bilaterally. Patent vertebral arteries with antegrade flow.   ASSESSMENT:    1. Persistent atrial fibrillation ([place])   2. Essential hypertension   3. Hyperlipidemia, unspecified hyperlipidemia type   4. Acute posthemorrhagic anemia      PLAN:  In order of problems listed above:  1. Persistent atrial fibrillation on eliquis: Rate controlled based on physical exam and previous EKG. She is now back on eliquis, she has not had any  bleeding issues.  2. Hypertension: Blood pressure and heart rate well-controlled on diltiazem 180 mg daily.  3. Acute postoperative anemia: Baseline hemoglobin 12, hemoglobin 9.8 after surgery. She preferred to follow-up  on her CBC by her primary care physician. She has not noticed any bleeding issues after surgery.  4. Prediabetes: Hemoglobin A1c 5.7 in November, started on low-dose metformin.    Medication Adjustments/Labs and Tests Ordered: Current medicines are reviewed at length with the patient today.  Concerns regarding medicines are outlined above.  Medication changes, Labs and Tests ordered today are listed in the Patient Instructions below. Patient Instructions  Your physician wants you to follow-up in: 6 months or sooner if needed with Dr Stanford Breed. You will receive a reminder letter in the mail two months in advance. If you don't receive a letter, please call our office to schedule the follow-up appointment.  If you need a refill on your cardiac medications before your next appointment, please call your pharmacy.      Hilbert Corrigan, [place]  03/09/2016 1:04 AM    [address], [place], [place]  [address] Phone: [phone]; Fax: [phone]

## 2016-03-08 NOTE — Patient Instructions (Signed)
Your physician wants you to follow-up in: 6 months or sooner if needed with Dr Stanford Breed. You will receive a reminder letter in the mail two months in advance. If you don't receive a letter, please call our office to schedule the follow-up appointment.  If you need a refill on your cardiac medications before your next appointment, please call your pharmacy.

## 2016-03-09 ENCOUNTER — Encounter: Payer: Self-pay | Admitting: Physician Assistant

## 2016-03-12 DIAGNOSIS — D638 Anemia in other chronic diseases classified elsewhere: Secondary | ICD-10-CM | POA: Diagnosis not present

## 2016-03-12 DIAGNOSIS — I4891 Unspecified atrial fibrillation: Secondary | ICD-10-CM | POA: Diagnosis not present

## 2016-03-12 DIAGNOSIS — Z48815 Encounter for surgical aftercare following surgery on the digestive system: Secondary | ICD-10-CM | POA: Diagnosis not present

## 2016-03-12 DIAGNOSIS — E1142 Type 2 diabetes mellitus with diabetic polyneuropathy: Secondary | ICD-10-CM | POA: Diagnosis not present

## 2016-03-12 DIAGNOSIS — I10 Essential (primary) hypertension: Secondary | ICD-10-CM | POA: Diagnosis not present

## 2016-03-12 DIAGNOSIS — G40909 Epilepsy, unspecified, not intractable, without status epilepticus: Secondary | ICD-10-CM | POA: Diagnosis not present

## 2016-03-14 DIAGNOSIS — E1142 Type 2 diabetes mellitus with diabetic polyneuropathy: Secondary | ICD-10-CM | POA: Diagnosis not present

## 2016-03-14 DIAGNOSIS — G40909 Epilepsy, unspecified, not intractable, without status epilepticus: Secondary | ICD-10-CM | POA: Diagnosis not present

## 2016-03-14 DIAGNOSIS — I4891 Unspecified atrial fibrillation: Secondary | ICD-10-CM | POA: Diagnosis not present

## 2016-03-14 DIAGNOSIS — I10 Essential (primary) hypertension: Secondary | ICD-10-CM | POA: Diagnosis not present

## 2016-03-14 DIAGNOSIS — D638 Anemia in other chronic diseases classified elsewhere: Secondary | ICD-10-CM | POA: Diagnosis not present

## 2016-03-14 DIAGNOSIS — Z48815 Encounter for surgical aftercare following surgery on the digestive system: Secondary | ICD-10-CM | POA: Diagnosis not present

## 2016-03-19 DIAGNOSIS — I10 Essential (primary) hypertension: Secondary | ICD-10-CM | POA: Diagnosis not present

## 2016-03-19 DIAGNOSIS — E1142 Type 2 diabetes mellitus with diabetic polyneuropathy: Secondary | ICD-10-CM | POA: Diagnosis not present

## 2016-03-19 DIAGNOSIS — Z48815 Encounter for surgical aftercare following surgery on the digestive system: Secondary | ICD-10-CM | POA: Diagnosis not present

## 2016-03-19 DIAGNOSIS — D638 Anemia in other chronic diseases classified elsewhere: Secondary | ICD-10-CM | POA: Diagnosis not present

## 2016-03-19 DIAGNOSIS — I4891 Unspecified atrial fibrillation: Secondary | ICD-10-CM | POA: Diagnosis not present

## 2016-03-19 DIAGNOSIS — G40909 Epilepsy, unspecified, not intractable, without status epilepticus: Secondary | ICD-10-CM | POA: Diagnosis not present

## 2016-03-20 DIAGNOSIS — E1142 Type 2 diabetes mellitus with diabetic polyneuropathy: Secondary | ICD-10-CM | POA: Diagnosis not present

## 2016-03-20 DIAGNOSIS — H18893 Other specified disorders of cornea, bilateral: Secondary | ICD-10-CM | POA: Diagnosis not present

## 2016-03-20 DIAGNOSIS — D638 Anemia in other chronic diseases classified elsewhere: Secondary | ICD-10-CM | POA: Diagnosis not present

## 2016-03-20 DIAGNOSIS — I10 Essential (primary) hypertension: Secondary | ICD-10-CM | POA: Diagnosis not present

## 2016-03-20 DIAGNOSIS — M3501 Sicca syndrome with keratoconjunctivitis: Secondary | ICD-10-CM | POA: Diagnosis not present

## 2016-03-20 DIAGNOSIS — Z961 Presence of intraocular lens: Secondary | ICD-10-CM | POA: Diagnosis not present

## 2016-03-20 DIAGNOSIS — I4891 Unspecified atrial fibrillation: Secondary | ICD-10-CM | POA: Diagnosis not present

## 2016-03-20 DIAGNOSIS — Z48815 Encounter for surgical aftercare following surgery on the digestive system: Secondary | ICD-10-CM | POA: Diagnosis not present

## 2016-03-20 DIAGNOSIS — M3500 Sicca syndrome, unspecified: Secondary | ICD-10-CM | POA: Diagnosis not present

## 2016-03-20 DIAGNOSIS — G40909 Epilepsy, unspecified, not intractable, without status epilepticus: Secondary | ICD-10-CM | POA: Diagnosis not present

## 2016-03-21 ENCOUNTER — Other Ambulatory Visit: Payer: Self-pay | Admitting: Adult Health

## 2016-03-21 DIAGNOSIS — I4891 Unspecified atrial fibrillation: Secondary | ICD-10-CM | POA: Diagnosis not present

## 2016-03-21 DIAGNOSIS — Z48815 Encounter for surgical aftercare following surgery on the digestive system: Secondary | ICD-10-CM | POA: Diagnosis not present

## 2016-03-21 DIAGNOSIS — I10 Essential (primary) hypertension: Secondary | ICD-10-CM | POA: Diagnosis not present

## 2016-03-21 DIAGNOSIS — D638 Anemia in other chronic diseases classified elsewhere: Secondary | ICD-10-CM | POA: Diagnosis not present

## 2016-03-21 DIAGNOSIS — E1142 Type 2 diabetes mellitus with diabetic polyneuropathy: Secondary | ICD-10-CM | POA: Diagnosis not present

## 2016-03-21 DIAGNOSIS — G40909 Epilepsy, unspecified, not intractable, without status epilepticus: Secondary | ICD-10-CM | POA: Diagnosis not present

## 2016-03-22 DIAGNOSIS — D638 Anemia in other chronic diseases classified elsewhere: Secondary | ICD-10-CM | POA: Diagnosis not present

## 2016-03-22 DIAGNOSIS — E1142 Type 2 diabetes mellitus with diabetic polyneuropathy: Secondary | ICD-10-CM | POA: Diagnosis not present

## 2016-03-22 DIAGNOSIS — G40909 Epilepsy, unspecified, not intractable, without status epilepticus: Secondary | ICD-10-CM | POA: Diagnosis not present

## 2016-03-22 DIAGNOSIS — Z48815 Encounter for surgical aftercare following surgery on the digestive system: Secondary | ICD-10-CM | POA: Diagnosis not present

## 2016-03-22 DIAGNOSIS — I10 Essential (primary) hypertension: Secondary | ICD-10-CM | POA: Diagnosis not present

## 2016-03-22 DIAGNOSIS — I4891 Unspecified atrial fibrillation: Secondary | ICD-10-CM | POA: Diagnosis not present

## 2016-03-25 DIAGNOSIS — G40909 Epilepsy, unspecified, not intractable, without status epilepticus: Secondary | ICD-10-CM | POA: Diagnosis not present

## 2016-03-25 DIAGNOSIS — Z48815 Encounter for surgical aftercare following surgery on the digestive system: Secondary | ICD-10-CM | POA: Diagnosis not present

## 2016-03-25 DIAGNOSIS — I4891 Unspecified atrial fibrillation: Secondary | ICD-10-CM | POA: Diagnosis not present

## 2016-03-25 DIAGNOSIS — I10 Essential (primary) hypertension: Secondary | ICD-10-CM | POA: Diagnosis not present

## 2016-03-25 DIAGNOSIS — D638 Anemia in other chronic diseases classified elsewhere: Secondary | ICD-10-CM | POA: Diagnosis not present

## 2016-03-25 DIAGNOSIS — E1142 Type 2 diabetes mellitus with diabetic polyneuropathy: Secondary | ICD-10-CM | POA: Diagnosis not present

## 2016-03-27 DIAGNOSIS — D638 Anemia in other chronic diseases classified elsewhere: Secondary | ICD-10-CM | POA: Diagnosis not present

## 2016-03-27 DIAGNOSIS — G40909 Epilepsy, unspecified, not intractable, without status epilepticus: Secondary | ICD-10-CM | POA: Diagnosis not present

## 2016-03-27 DIAGNOSIS — I10 Essential (primary) hypertension: Secondary | ICD-10-CM | POA: Diagnosis not present

## 2016-03-27 DIAGNOSIS — I4891 Unspecified atrial fibrillation: Secondary | ICD-10-CM | POA: Diagnosis not present

## 2016-03-27 DIAGNOSIS — Z48815 Encounter for surgical aftercare following surgery on the digestive system: Secondary | ICD-10-CM | POA: Diagnosis not present

## 2016-03-27 DIAGNOSIS — E1142 Type 2 diabetes mellitus with diabetic polyneuropathy: Secondary | ICD-10-CM | POA: Diagnosis not present

## 2016-03-28 DIAGNOSIS — Z48815 Encounter for surgical aftercare following surgery on the digestive system: Secondary | ICD-10-CM | POA: Diagnosis not present

## 2016-03-28 DIAGNOSIS — I4891 Unspecified atrial fibrillation: Secondary | ICD-10-CM | POA: Diagnosis not present

## 2016-03-28 DIAGNOSIS — E1142 Type 2 diabetes mellitus with diabetic polyneuropathy: Secondary | ICD-10-CM | POA: Diagnosis not present

## 2016-03-28 DIAGNOSIS — G40909 Epilepsy, unspecified, not intractable, without status epilepticus: Secondary | ICD-10-CM | POA: Diagnosis not present

## 2016-03-28 DIAGNOSIS — D638 Anemia in other chronic diseases classified elsewhere: Secondary | ICD-10-CM | POA: Diagnosis not present

## 2016-03-28 DIAGNOSIS — I10 Essential (primary) hypertension: Secondary | ICD-10-CM | POA: Diagnosis not present

## 2016-03-29 ENCOUNTER — Other Ambulatory Visit: Payer: Self-pay | Admitting: Adult Health

## 2016-04-01 DIAGNOSIS — R05 Cough: Secondary | ICD-10-CM | POA: Diagnosis not present

## 2016-04-01 DIAGNOSIS — J209 Acute bronchitis, unspecified: Secondary | ICD-10-CM | POA: Diagnosis not present

## 2016-04-01 DIAGNOSIS — R509 Fever, unspecified: Secondary | ICD-10-CM | POA: Diagnosis not present

## 2016-04-05 DIAGNOSIS — Z48815 Encounter for surgical aftercare following surgery on the digestive system: Secondary | ICD-10-CM | POA: Diagnosis not present

## 2016-04-05 DIAGNOSIS — I4891 Unspecified atrial fibrillation: Secondary | ICD-10-CM | POA: Diagnosis not present

## 2016-04-05 DIAGNOSIS — G40909 Epilepsy, unspecified, not intractable, without status epilepticus: Secondary | ICD-10-CM | POA: Diagnosis not present

## 2016-04-05 DIAGNOSIS — D638 Anemia in other chronic diseases classified elsewhere: Secondary | ICD-10-CM | POA: Diagnosis not present

## 2016-04-05 DIAGNOSIS — I10 Essential (primary) hypertension: Secondary | ICD-10-CM | POA: Diagnosis not present

## 2016-04-05 DIAGNOSIS — E1142 Type 2 diabetes mellitus with diabetic polyneuropathy: Secondary | ICD-10-CM | POA: Diagnosis not present

## 2016-04-08 DIAGNOSIS — I10 Essential (primary) hypertension: Secondary | ICD-10-CM | POA: Diagnosis not present

## 2016-04-08 DIAGNOSIS — D638 Anemia in other chronic diseases classified elsewhere: Secondary | ICD-10-CM | POA: Diagnosis not present

## 2016-04-08 DIAGNOSIS — E1142 Type 2 diabetes mellitus with diabetic polyneuropathy: Secondary | ICD-10-CM | POA: Diagnosis not present

## 2016-04-08 DIAGNOSIS — G40909 Epilepsy, unspecified, not intractable, without status epilepticus: Secondary | ICD-10-CM | POA: Diagnosis not present

## 2016-04-08 DIAGNOSIS — I4891 Unspecified atrial fibrillation: Secondary | ICD-10-CM | POA: Diagnosis not present

## 2016-04-08 DIAGNOSIS — Z48815 Encounter for surgical aftercare following surgery on the digestive system: Secondary | ICD-10-CM | POA: Diagnosis not present

## 2016-04-10 DIAGNOSIS — Z48815 Encounter for surgical aftercare following surgery on the digestive system: Secondary | ICD-10-CM | POA: Diagnosis not present

## 2016-04-10 DIAGNOSIS — I4891 Unspecified atrial fibrillation: Secondary | ICD-10-CM | POA: Diagnosis not present

## 2016-04-10 DIAGNOSIS — E1142 Type 2 diabetes mellitus with diabetic polyneuropathy: Secondary | ICD-10-CM | POA: Diagnosis not present

## 2016-04-10 DIAGNOSIS — G40909 Epilepsy, unspecified, not intractable, without status epilepticus: Secondary | ICD-10-CM | POA: Diagnosis not present

## 2016-04-10 DIAGNOSIS — I10 Essential (primary) hypertension: Secondary | ICD-10-CM | POA: Diagnosis not present

## 2016-04-10 DIAGNOSIS — D638 Anemia in other chronic diseases classified elsewhere: Secondary | ICD-10-CM | POA: Diagnosis not present

## 2016-04-11 DIAGNOSIS — I10 Essential (primary) hypertension: Secondary | ICD-10-CM | POA: Diagnosis not present

## 2016-04-11 DIAGNOSIS — D638 Anemia in other chronic diseases classified elsewhere: Secondary | ICD-10-CM | POA: Diagnosis not present

## 2016-04-11 DIAGNOSIS — E1142 Type 2 diabetes mellitus with diabetic polyneuropathy: Secondary | ICD-10-CM | POA: Diagnosis not present

## 2016-04-11 DIAGNOSIS — G40909 Epilepsy, unspecified, not intractable, without status epilepticus: Secondary | ICD-10-CM | POA: Diagnosis not present

## 2016-04-11 DIAGNOSIS — I4891 Unspecified atrial fibrillation: Secondary | ICD-10-CM | POA: Diagnosis not present

## 2016-04-11 DIAGNOSIS — Z48815 Encounter for surgical aftercare following surgery on the digestive system: Secondary | ICD-10-CM | POA: Diagnosis not present

## 2016-04-12 ENCOUNTER — Ambulatory Visit (INDEPENDENT_AMBULATORY_CARE_PROVIDER_SITE_OTHER): Payer: Medicare Other | Admitting: Neurology

## 2016-04-12 ENCOUNTER — Other Ambulatory Visit (INDEPENDENT_AMBULATORY_CARE_PROVIDER_SITE_OTHER): Payer: Medicare Other

## 2016-04-12 ENCOUNTER — Encounter: Payer: Self-pay | Admitting: Neurology

## 2016-04-12 VITALS — BP 100/70 | HR 102 | Ht 61.0 in | Wt 168.2 lb

## 2016-04-12 DIAGNOSIS — E538 Deficiency of other specified B group vitamins: Secondary | ICD-10-CM

## 2016-04-12 DIAGNOSIS — R202 Paresthesia of skin: Secondary | ICD-10-CM

## 2016-04-12 DIAGNOSIS — G43109 Migraine with aura, not intractable, without status migrainosus: Secondary | ICD-10-CM | POA: Diagnosis not present

## 2016-04-12 DIAGNOSIS — M3509 Sicca syndrome with other organ involvement: Secondary | ICD-10-CM | POA: Diagnosis not present

## 2016-04-12 DIAGNOSIS — M3506 Sjogren syndrome with peripheral nervous system involvement: Secondary | ICD-10-CM

## 2016-04-12 DIAGNOSIS — G63 Polyneuropathy in diseases classified elsewhere: Secondary | ICD-10-CM

## 2016-04-12 LAB — VITAMIN B12: Vitamin B-12: 260 pg/mL (ref 211–911)

## 2016-04-12 NOTE — Progress Notes (Signed)
Follow-up Visit   Date: 04/12/16    SHANNAE ESTRELA MRN: [id number] DOB: [birth year]   [name] is a delightful 81 y.o. right-handed Caucasian female with hypertension, Sjogren's syndrome (diagnosed 0000000) complicated by neuropathy and vision impairment, migraines, atrial fibrillation (on apixiban), history of shingles affecting left T6 (07/2009) and stroke (September 2014, residual word-finding difficulty) returning to the clinic with complaints of new painful paresthesias of the scalp.  History of present illness: She was under the care of Dr. Erling Cruz at [facility] for a over 30 years for migraines, stroke, peripheral neuropathy, headaches, and gait instability. She has a long history of migraines or started at the age of 43. Her headaches were well controlled on Depakote 250 twice a day. However, it was stopped due to low platelets and anticoagulation. She complains of right-sided throbbing and pulsating pain. She has nausea, photophobia, and phonophobia.  Previously tried medication for headaches is depakote, Botox (2 trials without benefit) and maxalt. Being on apixiban, she is unable to take NSAIDs due to bleeding risk.  She was given a prednisone taper dose with dramatic improvement of headaches and has been relatively well since then.   She was admitted to the hospital in September 2014 for community-acquired pneumonia and found to have new onset atrial fibrillation. Due to worsening headaches, MRI of the brain was obtained in October which showed a small left thalamic subacute stroke.  In 2015, she was started on Lyrica for headaches and neuropathy and responded well.   . - UPDATE 05/10/2015:  She presents today with two new issues (1) Around early January, she started having new complaints of intense sneezing for 5-7 times, followed by 5-minutes of bilateral arms shakes.  There is no loss of consciousness, weakness, numbness/tingling, or confusion. It usually occurs when  she is sitting in her car. She is able to communicate during these spells. (2) She also has another sensation of both hands feeling cold and developing painful tingling, only occuring when sitting on the commode to void or defecate, which has been ongoing since earlier this year.  There is no weakness. This sensation is worse when she is straining.  These symptoms do not occur when sitting, sneezing, coughing, or laughing.  - UPDATE 08/16/2015:  She continues to have sneezing spells and shaking of the arms, but her EEG is normal.  She did not have NCS/EMG because of GI illness and fistula.  She continues to have right hand numbness, and does not wish to have NCS/EMG until her other medical issues are better.  Neuropathy is well controlled on Lyrica 150mg -300mg .  Headaches continue to be mild and infrequent.  She is mostly bothered by his GI symptoms and is struggling to decide whether she would like to undergo surgery for this or not.  - UPDATE 04/12/2016:  She was hospitalized in November with colovesical fistula s/p robotic colectomy & bladder closure 02/07/2016 and has been recovering from this. She is now back at home and getting home nursing care and PT.  Since using her walker at all times, she has not suffered any falls.  Today, she has new complaints of intermittent bee sting sensation over the base of neck about 5 times per week, lasting a few hours. This does not radiate into her scalp.  The pain can be very bothersome sometimes.  She denies any headaches or neck pain.  Her neuropathy has becomes much less painful and well controlled with Lyrica 150-300.  She has noticed  less frequent spells of sneezing and arm shaking.  Medications:  Current Outpatient Prescriptions on File Prior to Visit  Medication Sig Dispense Refill  . apixaban (ELIQUIS) 5 MG TABS tablet Take 1 tablet (5 mg total) by mouth 2 (two) times daily. 180 tablet 2  . cyclobenzaprine (FLEXERIL) 5 MG tablet Take 1 tablet (5 mg total) by  mouth at bedtime. 90 tablet 3  . diltiazem (CARDIZEM CD) 180 MG 24 hr capsule TAKE 1 CAPSULE (180 MG TOTAL) BY MOUTH DAILY. 90 capsule 3  . docusate sodium (COLACE) 100 MG capsule Take 100 mg by mouth at bedtime.     . dorzolamide (TRUSOPT) 2 % ophthalmic solution Place 1 drop into both eyes 2 (two) times daily.    . fenofibrate (TRICOR) 145 MG tablet Take 145 mg by mouth at bedtime.   0  . metFORMIN (GLUCOPHAGE-XR) 500 MG 24 hr tablet Take 500 mg by mouth every evening.     Marland Kitchen omeprazole-sodium bicarbonate (ZEGERID) 40-1100 MG capsule Take 1 capsule by mouth daily before breakfast.    . ondansetron (ZOFRAN) 4 MG tablet Take 4 mg by mouth every 8 (eight) hours as needed for nausea or vomiting.    . pregabalin (LYRICA) 150 MG capsule Take 150-300 mg by mouth 2 (two) times daily. Pt takes one capsule every morning and two at bedtime.    . promethazine (PHENERGAN) 25 MG tablet Take 12.5 mg by mouth every 6 (six) hours as needed for nausea or vomiting.     . timolol (BETIMOL) 0.5 % ophthalmic solution Place 1 drop into both eyes 2 (two) times daily.     . traMADol (ULTRAM) 50 MG tablet Take 1-2 tablets (50-100 mg total) by mouth every 6 (six) hours as needed for moderate pain or severe pain. 30 tablet 0  . traZODone (DESYREL) 50 MG tablet Take 25-50 mg by mouth at bedtime as needed for sleep.     No current facility-administered medications on file prior to visit.     Allergies:  Allergies  Allergen Reactions  . Oxycodone-Aspirin Nausea And Vomiting  . Codeine Nausea And Vomiting  . Hydrocodone Nausea And Vomiting  . Morphine And Related Nausea And Vomiting  . Oxycodone Nausea And Vomiting  . Pilocarpine Other (See Comments)    Reaction:  Unknown   . Rivaroxaban Other (See Comments)    Reaction:  Back pain/headaches      Review of Systems:  CONSTITUTIONAL: No fevers, chills, night sweats, +weight loss.   EYES: +visual changes or eye pain ENT: No hearing changes.  No history of nose  bleeds.   RESPIRATORY: +cough, wheezing and shortness of breath.   CARDIOVASCULAR: Negative for chest pain, and palpitations.   GI: +for abdominal discomfort, blood in stools or black stools.  No recent change in bowel habits.   GU:  No history of incontinence.  MUSCLOSKELETAL: No history of joint pain or swelling.  No myalgias.   SKIN: No for lesions, rash, and itching.   ENDOCRINE: Negative for cold or heat intolerance, polydipsia or goiter.   PSYCH:  No depression or anxiety symptoms.   NEURO: As Above.   Vital Signs:  BP 100/70   Pulse (!) 102   Ht 5\' 1"  (1.549 m)   Wt 168 lb 4 oz (76.3 kg)   SpO2 96%   BMI 31.79 kg/m   Neurological Exam: MENTAL STATUS including orientation to time, place, person, recent and remote memory, attention span and concentration, language, and fund of knowledge is  fair.   Speech is not dysarthric.  CRANIAL NERVES:  Severe visual impairment bilaterally, she is only able to see colors. Irregular pupils, but reactive to light.  Mild bilateral ptosis. Face is symmetric. Dry oral mucosa and tongue.   MOTOR: Motor strength is 5-/5 throughout.  Tone is normal. There is no tenderness over the GON or LON on the right.   SENSATION:  Vibration intact in the upper extremities and absent distal to ankles bilaterally.    COORDINATION/GAIT: Gait is moderately wide-based with small, short steps, stooped posture, assisted with walker.   Data: MRI brain wo contrast 12/23/2012: Very small acute/ subacute left thalamic nonhemorrhagic infarct. Prominent small vessel disease type changes. No intracranial hemorrhage. Mild atrophy without hydrocephalus.  Prior clinic notes from Dr. Rexene Alberts, previous neurological workup includes:  EEG 06/03/1995, 08/31/2008, 05/25/2015: normal  CSF 1991: normal  Lab Results  Component Value Date   [id number] 08/30/2015   Lab Results  Component Value Date   TSH 0.834 12/06/2012    IMPRESSION/PLAN:   1. ?Occipital neuralgia causing  scalp paresthesias  - Increase Lyrica by 50mg /d which she already takes for neuropathy and migraines  - Avoid TCA with her afib  2.  Mixed large and small fiber sensorimotor peripheral neuropathy due to Sjogren's disease, stable  - Numbness involving glove-stocking distribution as a progression of the disease  - Pain is controlled with Lyrica 150/300, but will increase her morning dose to 200mg  and keep nighttime dose at 300mg  to help with occipital neuralgia  3. Migraine without aura, chronic daily headaches - stable  - Previously used:  VPA (stopped due to low PLT), triptans (stroke), flexeril (no improvement), botox  - She had significant benefit with trial of medrol dose pak  - Continue Lyrica  4. Vitamin B12 deficiency  - Check level vitamin B12   5.  Spells of sneezing with associated abnormal hand movements  - These are not suggeestive seizures based on history.  EEG and US carotids nondiagnostic  6. Multifactorial gait instability with generalized weakness, uses 4-wheeled rollator.   7. Left thalamic stroke, likely cardioembolic due to afib on apixiban  8. Age-related memory loss possibly transitioning into vascular dementia  - Neuropsychological testing 05/25/2010: Decreased verbal fluency, otherwise normal exam.  Return to clinic in 7-months  The duration of this appointment visit was 25 minutes of face-to-face time with the patient.  Greater than 50% of this time was spent in counseling, explanation of diagnosis, planning of further management, and coordination of care.   Thank you for allowing me to participate in patient's care.  If I can answer any additional questions, I would be pleased to do so.    Sincerely,    Sherl Yzaguirre K. Posey Pronto, DO

## 2016-04-12 NOTE — Patient Instructions (Addendum)
Increase Lyrica to 200mg  in the morning and continue 300mg  at bedtime Check vitamin B12 level  Return to clinic in 5-6 months

## 2016-04-15 ENCOUNTER — Encounter (HOSPITAL_COMMUNITY): Payer: Self-pay | Admitting: Emergency Medicine

## 2016-04-15 ENCOUNTER — Inpatient Hospital Stay (HOSPITAL_COMMUNITY)
Admission: EM | Admit: 2016-04-15 | Discharge: 2016-04-18 | DRG: 378 | Disposition: A | Discharge status: Home / Self Care | Payer: Medicare Other | Attending: Internal Medicine | Admitting: Internal Medicine

## 2016-04-15 DIAGNOSIS — G4730 Sleep apnea, unspecified: Secondary | ICD-10-CM | POA: Diagnosis present

## 2016-04-15 DIAGNOSIS — I4891 Unspecified atrial fibrillation: Secondary | ICD-10-CM | POA: Diagnosis present

## 2016-04-15 DIAGNOSIS — E785 Hyperlipidemia, unspecified: Secondary | ICD-10-CM | POA: Diagnosis not present

## 2016-04-15 DIAGNOSIS — Z87891 Personal history of nicotine dependence: Secondary | ICD-10-CM

## 2016-04-15 DIAGNOSIS — M3500 Sicca syndrome, unspecified: Secondary | ICD-10-CM | POA: Diagnosis not present

## 2016-04-15 DIAGNOSIS — Z807 Family history of other malignant neoplasms of lymphoid, hematopoietic and related tissues: Secondary | ICD-10-CM

## 2016-04-15 DIAGNOSIS — G43909 Migraine, unspecified, not intractable, without status migrainosus: Secondary | ICD-10-CM | POA: Diagnosis present

## 2016-04-15 DIAGNOSIS — Z90710 Acquired absence of both cervix and uterus: Secondary | ICD-10-CM

## 2016-04-15 DIAGNOSIS — I10 Essential (primary) hypertension: Secondary | ICD-10-CM | POA: Diagnosis present

## 2016-04-15 DIAGNOSIS — D649 Anemia, unspecified: Secondary | ICD-10-CM | POA: Diagnosis present

## 2016-04-15 DIAGNOSIS — E1142 Type 2 diabetes mellitus with diabetic polyneuropathy: Secondary | ICD-10-CM | POA: Diagnosis present

## 2016-04-15 DIAGNOSIS — Z7901 Long term (current) use of anticoagulants: Secondary | ICD-10-CM

## 2016-04-15 DIAGNOSIS — E1143 Type 2 diabetes mellitus with diabetic autonomic (poly)neuropathy: Secondary | ICD-10-CM | POA: Diagnosis not present

## 2016-04-15 DIAGNOSIS — Z7984 Long term (current) use of oral hypoglycemic drugs: Secondary | ICD-10-CM | POA: Diagnosis not present

## 2016-04-15 DIAGNOSIS — M810 Age-related osteoporosis without current pathological fracture: Secondary | ICD-10-CM | POA: Diagnosis present

## 2016-04-15 DIAGNOSIS — E538 Deficiency of other specified B group vitamins: Secondary | ICD-10-CM | POA: Diagnosis not present

## 2016-04-15 DIAGNOSIS — E559 Vitamin D deficiency, unspecified: Secondary | ICD-10-CM | POA: Diagnosis not present

## 2016-04-15 DIAGNOSIS — Z8673 Personal history of transient ischemic attack (TIA), and cerebral infarction without residual deficits: Secondary | ICD-10-CM

## 2016-04-15 DIAGNOSIS — H401133 Primary open-angle glaucoma, bilateral, severe stage: Secondary | ICD-10-CM | POA: Diagnosis present

## 2016-04-15 DIAGNOSIS — Z9849 Cataract extraction status, unspecified eye: Secondary | ICD-10-CM

## 2016-04-15 DIAGNOSIS — E119 Type 2 diabetes mellitus without complications: Secondary | ICD-10-CM

## 2016-04-15 DIAGNOSIS — R531 Weakness: Secondary | ICD-10-CM | POA: Diagnosis not present

## 2016-04-15 DIAGNOSIS — K922 Gastrointestinal hemorrhage, unspecified: Secondary | ICD-10-CM | POA: Diagnosis not present

## 2016-04-15 DIAGNOSIS — D62 Acute posthemorrhagic anemia: Secondary | ICD-10-CM | POA: Diagnosis not present

## 2016-04-15 DIAGNOSIS — M1990 Unspecified osteoarthritis, unspecified site: Secondary | ICD-10-CM | POA: Diagnosis present

## 2016-04-15 DIAGNOSIS — I480 Paroxysmal atrial fibrillation: Secondary | ICD-10-CM | POA: Diagnosis not present

## 2016-04-15 DIAGNOSIS — G40909 Epilepsy, unspecified, not intractable, without status epilepticus: Secondary | ICD-10-CM | POA: Diagnosis not present

## 2016-04-15 DIAGNOSIS — I8500 Esophageal varices without bleeding: Secondary | ICD-10-CM | POA: Diagnosis not present

## 2016-04-15 DIAGNOSIS — K5730 Diverticulosis of large intestine without perforation or abscess without bleeding: Secondary | ICD-10-CM | POA: Diagnosis not present

## 2016-04-15 DIAGNOSIS — H548 Legal blindness, as defined in USA: Secondary | ICD-10-CM | POA: Diagnosis present

## 2016-04-15 DIAGNOSIS — K219 Gastro-esophageal reflux disease without esophagitis: Secondary | ICD-10-CM | POA: Diagnosis present

## 2016-04-15 DIAGNOSIS — Z888 Allergy status to other drugs, medicaments and biological substances status: Secondary | ICD-10-CM

## 2016-04-15 DIAGNOSIS — D638 Anemia in other chronic diseases classified elsewhere: Secondary | ICD-10-CM | POA: Diagnosis not present

## 2016-04-15 DIAGNOSIS — L409 Psoriasis, unspecified: Secondary | ICD-10-CM | POA: Diagnosis present

## 2016-04-15 DIAGNOSIS — Z79899 Other long term (current) drug therapy: Secondary | ICD-10-CM

## 2016-04-15 DIAGNOSIS — R05 Cough: Secondary | ICD-10-CM | POA: Diagnosis not present

## 2016-04-15 DIAGNOSIS — Z800 Family history of malignant neoplasm of digestive organs: Secondary | ICD-10-CM

## 2016-04-15 DIAGNOSIS — Z823 Family history of stroke: Secondary | ICD-10-CM

## 2016-04-15 DIAGNOSIS — Z9049 Acquired absence of other specified parts of digestive tract: Secondary | ICD-10-CM

## 2016-04-15 DIAGNOSIS — M47816 Spondylosis without myelopathy or radiculopathy, lumbar region: Secondary | ICD-10-CM | POA: Diagnosis not present

## 2016-04-15 DIAGNOSIS — R1012 Left upper quadrant pain: Secondary | ICD-10-CM | POA: Diagnosis not present

## 2016-04-15 DIAGNOSIS — Z48815 Encounter for surgical aftercare following surgery on the digestive system: Secondary | ICD-10-CM | POA: Diagnosis not present

## 2016-04-15 DIAGNOSIS — Z885 Allergy status to narcotic agent status: Secondary | ICD-10-CM

## 2016-04-15 LAB — CBC WITH DIFFERENTIAL/PLATELET
BASOS ABS: 0 10*3/uL (ref 0.0–0.1)
BASOS PCT: 0 %
EOS PCT: 1 %
Eosinophils Absolute: 0.1 10*3/uL (ref 0.0–0.7)
HCT: 25.1 % — ABNORMAL LOW (ref 36.0–46.0)
HEMOGLOBIN: 7.4 g/dL — AB (ref 12.0–15.0)
LYMPHS ABS: 2.3 10*3/uL (ref 0.7–4.0)
LYMPHS PCT: 25 %
MCH: 21.8 pg — AB (ref 26.0–34.0)
MCHC: 29.5 g/dL — AB (ref 30.0–36.0)
MCV: 74 fL — AB (ref 78.0–100.0)
MONOS PCT: 7 %
Monocytes Absolute: 0.7 10*3/uL (ref 0.1–1.0)
NEUTROS ABS: 6.2 10*3/uL (ref 1.7–7.7)
Neutrophils Relative %: 67 %
Platelets: 213 10*3/uL (ref 150–400)
RBC: 3.39 MIL/uL — ABNORMAL LOW (ref 3.87–5.11)
RDW: 19.1 % — ABNORMAL HIGH (ref 11.5–15.5)
WBC: 9.3 10*3/uL (ref 4.0–10.5)

## 2016-04-15 LAB — COMPREHENSIVE METABOLIC PANEL
ALT: 10 U/L — AB (ref 14–54)
ANION GAP: 9 (ref 5–15)
AST: 21 U/L (ref 15–41)
Albumin: 3.3 g/dL — ABNORMAL LOW (ref 3.5–5.0)
Alkaline Phosphatase: 41 U/L (ref 38–126)
BILIRUBIN TOTAL: 0.3 mg/dL (ref 0.3–1.2)
BUN: 11 mg/dL (ref 6–20)
CO2: 24 mmol/L (ref 22–32)
CREATININE: 0.71 mg/dL (ref 0.44–1.00)
Calcium: 10.1 mg/dL (ref 8.9–10.3)
Chloride: 102 mmol/L (ref 101–111)
GFR calc non Af Amer: >60 mL/min (ref >60)
GLUCOSE: 112 mg/dL — AB (ref 65–99)
Potassium: 3.6 mmol/L (ref 3.5–5.1)
SODIUM: 135 mmol/L (ref 135–145)
TOTAL PROTEIN: 5.8 g/dL — AB (ref 6.5–8.1)

## 2016-04-15 LAB — ABO/RH: ABO/RH(D): O NEG

## 2016-04-15 MED ORDER — ONDANSETRON HCL 4 MG/2ML IJ SOLN
4.0000 mg | Freq: Once | INTRAMUSCULAR | Status: AC
  Administered 2016-04-16: 4 mg via INTRAVENOUS
  Filled 2016-04-15: qty 2

## 2016-04-15 NOTE — ED Notes (Signed)
pts PCP called RN 1st and explained that the patient would be coming by private vehicle. Pt was seen by PCP today and noted to have a low Hgb (7.9) and hypotensive; pt was sent home; PCP was later called and pt had a fall at home; PCP advised pt to be seen in an ER

## 2016-04-15 NOTE — ED Triage Notes (Signed)
Patient received a call from Dr. Cher Nakai clinic today  advised her to go to ER due low hemoglobin = 7.9 , [facility] at [facility] , reports generalized weakness/fatigue , pt. added that she tripped and fell today at her driveway with no injury .

## 2016-04-16 ENCOUNTER — Emergency Department (HOSPITAL_COMMUNITY): Payer: Medicare Other

## 2016-04-16 ENCOUNTER — Telehealth: Payer: Self-pay | Admitting: *Deleted

## 2016-04-16 ENCOUNTER — Encounter (HOSPITAL_COMMUNITY): Payer: Self-pay | Admitting: Family Medicine

## 2016-04-16 DIAGNOSIS — I480 Paroxysmal atrial fibrillation: Secondary | ICD-10-CM | POA: Diagnosis not present

## 2016-04-16 DIAGNOSIS — G4730 Sleep apnea, unspecified: Secondary | ICD-10-CM | POA: Diagnosis not present

## 2016-04-16 DIAGNOSIS — K922 Gastrointestinal hemorrhage, unspecified: Secondary | ICD-10-CM | POA: Diagnosis present

## 2016-04-16 DIAGNOSIS — H401133 Primary open-angle glaucoma, bilateral, severe stage: Secondary | ICD-10-CM | POA: Diagnosis not present

## 2016-04-16 DIAGNOSIS — L409 Psoriasis, unspecified: Secondary | ICD-10-CM | POA: Diagnosis present

## 2016-04-16 DIAGNOSIS — Z823 Family history of stroke: Secondary | ICD-10-CM | POA: Diagnosis not present

## 2016-04-16 DIAGNOSIS — I8500 Esophageal varices without bleeding: Secondary | ICD-10-CM | POA: Diagnosis not present

## 2016-04-16 DIAGNOSIS — Z8673 Personal history of transient ischemic attack (TIA), and cerebral infarction without residual deficits: Secondary | ICD-10-CM | POA: Diagnosis not present

## 2016-04-16 DIAGNOSIS — D62 Acute posthemorrhagic anemia: Secondary | ICD-10-CM | POA: Diagnosis not present

## 2016-04-16 DIAGNOSIS — Z888 Allergy status to other drugs, medicaments and biological substances status: Secondary | ICD-10-CM | POA: Diagnosis not present

## 2016-04-16 DIAGNOSIS — E782 Mixed hyperlipidemia: Secondary | ICD-10-CM | POA: Diagnosis not present

## 2016-04-16 DIAGNOSIS — Z87891 Personal history of nicotine dependence: Secondary | ICD-10-CM | POA: Diagnosis not present

## 2016-04-16 DIAGNOSIS — D649 Anemia, unspecified: Secondary | ICD-10-CM

## 2016-04-16 DIAGNOSIS — M3500 Sicca syndrome, unspecified: Secondary | ICD-10-CM | POA: Diagnosis not present

## 2016-04-16 DIAGNOSIS — Z7984 Long term (current) use of oral hypoglycemic drugs: Secondary | ICD-10-CM | POA: Diagnosis not present

## 2016-04-16 DIAGNOSIS — G43909 Migraine, unspecified, not intractable, without status migrainosus: Secondary | ICD-10-CM | POA: Diagnosis not present

## 2016-04-16 DIAGNOSIS — K2970 Gastritis, unspecified, without bleeding: Secondary | ICD-10-CM | POA: Diagnosis not present

## 2016-04-16 DIAGNOSIS — I481 Persistent atrial fibrillation: Secondary | ICD-10-CM | POA: Diagnosis not present

## 2016-04-16 DIAGNOSIS — Z90710 Acquired absence of both cervix and uterus: Secondary | ICD-10-CM | POA: Diagnosis not present

## 2016-04-16 DIAGNOSIS — E1142 Type 2 diabetes mellitus with diabetic polyneuropathy: Secondary | ICD-10-CM | POA: Diagnosis not present

## 2016-04-16 DIAGNOSIS — Z7901 Long term (current) use of anticoagulants: Secondary | ICD-10-CM | POA: Diagnosis not present

## 2016-04-16 DIAGNOSIS — M810 Age-related osteoporosis without current pathological fracture: Secondary | ICD-10-CM | POA: Diagnosis present

## 2016-04-16 DIAGNOSIS — K5730 Diverticulosis of large intestine without perforation or abscess without bleeding: Secondary | ICD-10-CM | POA: Diagnosis not present

## 2016-04-16 DIAGNOSIS — M1990 Unspecified osteoarthritis, unspecified site: Secondary | ICD-10-CM | POA: Diagnosis not present

## 2016-04-16 DIAGNOSIS — Z885 Allergy status to narcotic agent status: Secondary | ICD-10-CM | POA: Diagnosis not present

## 2016-04-16 DIAGNOSIS — Z79899 Other long term (current) drug therapy: Secondary | ICD-10-CM | POA: Diagnosis not present

## 2016-04-16 DIAGNOSIS — Z9849 Cataract extraction status, unspecified eye: Secondary | ICD-10-CM | POA: Diagnosis not present

## 2016-04-16 DIAGNOSIS — I10 Essential (primary) hypertension: Secondary | ICD-10-CM | POA: Diagnosis not present

## 2016-04-16 DIAGNOSIS — K219 Gastro-esophageal reflux disease without esophagitis: Secondary | ICD-10-CM | POA: Diagnosis not present

## 2016-04-16 DIAGNOSIS — E119 Type 2 diabetes mellitus without complications: Secondary | ICD-10-CM | POA: Diagnosis not present

## 2016-04-16 LAB — PREPARE RBC (CROSSMATCH)

## 2016-04-16 LAB — POC OCCULT BLOOD, ED: Fecal Occult Bld: POSITIVE — AB

## 2016-04-16 LAB — GLUCOSE, CAPILLARY
GLUCOSE-CAPILLARY: 105 mg/dL — AB (ref 65–99)
GLUCOSE-CAPILLARY: 96 mg/dL (ref 65–99)

## 2016-04-16 LAB — CBC
HCT: 25.7 % — ABNORMAL LOW (ref 36.0–46.0)
HEMOGLOBIN: 7.7 g/dL — AB (ref 12.0–15.0)
MCH: 22.6 pg — ABNORMAL LOW (ref 26.0–34.0)
MCHC: 30 g/dL (ref 30.0–36.0)
MCV: 75.6 fL — ABNORMAL LOW (ref 78.0–100.0)
PLATELETS: 195 10*3/uL (ref 150–400)
RBC: 3.4 MIL/uL — AB (ref 3.87–5.11)
RDW: 19.3 % — ABNORMAL HIGH (ref 11.5–15.5)
WBC: 9.1 10*3/uL (ref 4.0–10.5)

## 2016-04-16 LAB — CBG MONITORING, ED
GLUCOSE-CAPILLARY: 88 mg/dL (ref 65–99)
GLUCOSE-CAPILLARY: 112 mg/dL — AB (ref 65–99)

## 2016-04-16 MED ORDER — ACETAMINOPHEN 650 MG RE SUPP: 650.0000 mg | Freq: Four times a day (QID) | RECTAL | Status: DC | PRN

## 2016-04-16 MED ORDER — INSULIN ASPART 100 UNIT/ML ~~LOC~~ SOLN: 0.0000 [IU] | Freq: Four times a day (QID) | SUBCUTANEOUS | Status: DC

## 2016-04-16 MED ORDER — PREGABALIN 75 MG PO CAPS: 150.0000 mg | ORAL_CAPSULE | Freq: Every day | ORAL | Status: DC

## 2016-04-16 MED ORDER — ACETAMINOPHEN 325 MG PO TABS
650.0000 mg | ORAL_TABLET | Freq: Four times a day (QID) | ORAL | Status: DC | PRN
  Administered 2016-04-16 – 2016-04-17 (×2): 650 mg via ORAL
  Filled 2016-04-16 (×2): qty 2

## 2016-04-16 MED ORDER — SODIUM CHLORIDE 0.9 % IV SOLN
INTRAVENOUS | Status: DC
  Administered 2016-04-16 – 2016-04-17 (×2): via INTRAVENOUS

## 2016-04-16 MED ORDER — ONDANSETRON HCL 4 MG PO TABS: 4.0000 mg | ORAL_TABLET | Freq: Four times a day (QID) | ORAL | Status: DC | PRN

## 2016-04-16 MED ORDER — PREGABALIN 25 MG PO CAPS: 150.0000 mg | ORAL_CAPSULE | Freq: Two times a day (BID) | ORAL | Status: DC

## 2016-04-16 MED ORDER — PREGABALIN 75 MG PO CAPS
300.0000 mg | ORAL_CAPSULE | Freq: Every day | ORAL | Status: DC
  Administered 2016-04-16 – 2016-04-17 (×2): 300 mg via ORAL
  Filled 2016-04-16 (×2): qty 4

## 2016-04-16 MED ORDER — IOPAMIDOL (ISOVUE-300) INJECTION 61%
INTRAVENOUS | Status: AC
  Administered 2016-04-16: 100 mL via INTRAVENOUS
  Filled 2016-04-16: qty 100

## 2016-04-16 MED ORDER — DILTIAZEM HCL ER COATED BEADS 180 MG PO CP24
180.0000 mg | ORAL_CAPSULE | Freq: Every day | ORAL | Status: DC
  Administered 2016-04-16 – 2016-04-18 (×3): 180 mg via ORAL
  Filled 2016-04-16 (×4): qty 1

## 2016-04-16 MED ORDER — TIMOLOL MALEATE 0.5 % OP SOLN
1.0000 [drp] | Freq: Two times a day (BID) | OPHTHALMIC | Status: DC
  Administered 2016-04-16 – 2016-04-18 (×4): 1 [drp] via OPHTHALMIC
  Filled 2016-04-16: qty 5

## 2016-04-16 MED ORDER — PANTOPRAZOLE SODIUM 40 MG IV SOLR
40.0000 mg | Freq: Two times a day (BID) | INTRAVENOUS | Status: DC
  Administered 2016-04-16 – 2016-04-18 (×6): 40 mg via INTRAVENOUS
  Filled 2016-04-16 (×6): qty 40

## 2016-04-16 MED ORDER — PREGABALIN 75 MG PO CAPS
150.0000 mg | ORAL_CAPSULE | Freq: Every day | ORAL | Status: DC
  Administered 2016-04-16 – 2016-04-18 (×3): 150 mg via ORAL
  Filled 2016-04-16 (×4): qty 2

## 2016-04-16 MED ORDER — SODIUM CHLORIDE 0.9 % IV SOLN: Freq: Once | INTRAVENOUS | Status: DC

## 2016-04-16 MED ORDER — ONDANSETRON HCL 4 MG/2ML IJ SOLN: 4.0000 mg | Freq: Four times a day (QID) | INTRAMUSCULAR | Status: DC | PRN

## 2016-04-16 NOTE — Telephone Encounter (Signed)
-----   Message from Alda Berthold, DO sent at 04/12/2016  4:34 PM EST ----- Vitamin B12 is low-normal - recommend that she start taking vitamin B12 107mcg daily PO, since getting the injections here may be difficult.

## 2016-04-16 NOTE — ED Notes (Signed)
CBG 112.  

## 2016-04-16 NOTE — ED Notes (Signed)
Contacted pharmacy regarding Lyrica and requested this to be sent

## 2016-04-16 NOTE — Consult Note (Addendum)
[facility] Consult Note  Referring Provider: No ref. provider found Primary Care Physician:  Vidal Schwalbe, MD Primary Gastroenterologist:  Dr.  Laurel Dimmer Complaint: Weakness HPI: Krystal Clarke is an 81 y.o. Y female  presents with progressive weakness after being found to have a hemoglobin of 7.4 down from 9.8 a month and a half ago. The patient had a colovesical fistula repair surgically in November. She has not noticed any black or bloody stools. She had EGD and colonoscopy in March 2017 were essentially unrevealing. She rarely takes any nonsteroidal anti-inflammatory drugs. She is on eliquis.  Past Medical History:  Diagnosis Date  . Acute respiratory failure with hypoxia ([place]) 12/06/2012  . Allergy   . Blindness of right eye   . Complication of anesthesia   . Dehydration with hyponatremia 12/07/2012  . Diabetes mellitus without complication ([place])    diagnosed 2 weeks ago- No CBG meter as of yet.  Marland Kitchen DIVERTICULITIS, HX OF 01/27/2009   Qualifier: Diagnosis of  By: Jerold Coombe    . Gait disorder 06/05/2012  . GERD (gastroesophageal reflux disease)   . Glaucoma   . Glucose intolerance (impaired glucose tolerance)   . History of diverticulitis   . Hypertension   . Legally blind   . Migraine triggered seizures ([place])    history of  . Migraines   . Neuropathy ([place])   . OP (osteoporosis)   . Osteoarthritis    osteoarthritis -right knee. uses walker. Left shoulder -"Limited ROM"  . Paroxysmal atrial fibrillation ([place]) 12/06/2012   Echo (12/07/12): Moderate focal basal hypertrophy of the septum, vigorous LV function, EF 65-70%, indeterminate diastolic function, normal wall motion, trivial MR, moderate LAE, trivial TR;  Xarelto started 11/2012  . Peripheral neuropathy (HCC)    neuropathy  . Pneumonia   . PONV (postoperative nausea and vomiting)    no problems other past few surgeries  . SHINGLES, HX OF 07/17/2009   Qualifier: Diagnosis of  By: Jerold Coombe    .  Siriasis   . Sjogren's syndrome ([place])    bilateral vision  . Sleep apnea    does not wear CPAP  . Stroke [facility])    residual - rare Intermittent expressive aphasia,    Past Surgical History:  Procedure Laterality Date  . BONE TUMOR EXCISION Right    arm  . CATARACT EXTRACTION    . COLONOSCOPY WITH PROPOFOL N/A 06/09/2015   Procedure: COLONOSCOPY WITH PROPOFOL;  Surgeon: Ronald Lobo, MD;  Location: [place];  Service: Endoscopy;  Laterality: N/A;  . DILATION AND CURETTAGE OF UTERUS    . ESOPHAGOGASTRODUODENOSCOPY (EGD) WITH PROPOFOL N/A 06/09/2015   Procedure: ESOPHAGOGASTRODUODENOSCOPY (EGD) WITH PROPOFOL;  Surgeon: Ronald Lobo, MD;  Location: [place];  Service: Endoscopy;  Laterality: N/A;  . EYE SURGERY    . FOOT FRACTURE SURGERY Left 2007  . KNEE ARTHROSCOPY Right   . LUMBAR LAMINECTOMY    . ROBOT ASSISTED LAPAROSCOPIC PARTIAL COLECTOMY  02/07/2016   Procedure: ROBOT ASSISTED LAPAROSCOPIC PARTIAL COLECTOMY;  Surgeon: Leighton Ruff, MD;  Location: [facility];  Service: General;;  . SHOULDER OPEN ROTATOR CUFF REPAIR    . TONSILLECTOMY AND ADENOIDECTOMY    . TOTAL ABDOMINAL HYSTERECTOMY W/ BILATERAL SALPINGOOPHORECTOMY       (Not in a hospital admission)  Allergies:  Allergies  Allergen Reactions  . Oxycodone-Aspirin Nausea And Vomiting  . Codeine Nausea And Vomiting  . Hydrocodone Nausea And Vomiting  . Morphine And Related Nausea And Vomiting  . Oxycodone Nausea  And Vomiting  . Pilocarpine Other (See Comments)    Reaction:  Unknown   . Rivaroxaban Other (See Comments)    Reaction:  Back pain/headaches     Family History  Problem Relation Age of Onset  . Stroke Mother   . Migraines Mother   . Stomach cancer Father   . Sjogren's syndrome Father   . Sjogren's syndrome Brother   . Sjogren's syndrome Brother   . Scleroderma Sister     raynaud's scleroderma  . Lymphoma Daughter   . Migraines Daughter   . Heart disease      maternal side  . Stroke       maternal side, 1st degree female <60    Social History:  reports that she has quit smoking. Her smoking use included Cigarettes. She has never used smokeless tobacco. She reports that she does not drink alcohol or use drugs.  Review of Systems: negative except As above   Blood pressure 130/69, pulse 90, temperature 98.3 F (36.8 C), temperature source Oral, resp. rate 16, SpO2 95 %. Head: Normocephalic, without obvious abnormality, atraumatic Neck: no adenopathy, no carotid bruit, no JVD, supple, symmetrical, trachea midline and thyroid not enlarged, symmetric, no tenderness/mass/nodules Resp: clear to auscultation bilaterally Cardio: regular rate and rhythm, S1, S2 normal, no murmur, click, rub or gallop GI: Abdomen soft nondistended with normoactive bowel sounds. No hepatosplenomegaly mass or guarding Extremities: extremities normal, atraumatic, no cyanosis or edema  Results for orders placed or performed during the hospital encounter of 04/15/16 (from the past 48 hour(s))  CBC with Differential     Status: Abnormal   Collection Time: 04/15/16  7:41 PM  Result Value Ref Range   WBC 9.3 4.0 - 10.5 K/uL   RBC 3.39 (L) 3.87 - 5.11 MIL/uL   Hemoglobin 7.4 (L) 12.0 - 15.0 g/dL   HCT 25.1 (L) 36.0 - 46.0 %   MCV 74.0 (L) 78.0 - 100.0 fL   MCH 21.8 (L) 26.0 - 34.0 pg   MCHC 29.5 (L) 30.0 - 36.0 g/dL   RDW 19.1 (H) 11.5 - 15.5 %   Platelets 213 150 - 400 K/uL   Neutrophils Relative % 67 %   Lymphocytes Relative 25 %   Monocytes Relative 7 %   Eosinophils Relative 1 %   Basophils Relative 0 %   Neutro Abs 6.2 1.7 - 7.7 K/uL   Lymphs Abs 2.3 0.7 - 4.0 K/uL   Monocytes Absolute 0.7 0.1 - 1.0 K/uL   Eosinophils Absolute 0.1 0.0 - 0.7 K/uL   Basophils Absolute 0.0 0.0 - 0.1 K/uL   RBC Morphology ELLIPTOCYTES     Comment: STOMATOCYTES POLYCHROMASIA PRESENT    Smear Review LARGE PLATELETS PRESENT     Comment: PLATELETS APPEAR ADEQUATE  Comprehensive metabolic panel     Status:  Abnormal   Collection Time: 04/15/16  7:41 PM  Result Value Ref Range   Sodium 135 135 - 145 mmol/L   Potassium 3.6 3.5 - 5.1 mmol/L   Chloride 102 101 - 111 mmol/L   CO2 24 22 - 32 mmol/L   Glucose, Bld 112 (H) 65 - 99 mg/dL   BUN 11 6 - 20 mg/dL   Creatinine, Ser 0.71 0.44 - 1.00 mg/dL   Calcium 10.1 8.9 - 10.3 mg/dL   Total Protein 5.8 (L) 6.5 - 8.1 g/dL   Albumin 3.3 (L) 3.5 - 5.0 g/dL   AST 21 15 - 41 U/L   ALT 10 (L) 14 - 54 U/L  Alkaline Phosphatase 41 38 - 126 U/L   Total Bilirubin 0.3 0.3 - 1.2 mg/dL   GFR calc non Af Amer >60 >60 mL/min   GFR calc Af Amer >60 >60 mL/min    Comment: (NOTE) The eGFR has been calculated using the CKD EPI equation. This calculation has not been validated in all clinical situations. eGFR's persistently <60 mL/min signify possible Chronic Kidney Disease.    Anion gap 9 5 - 15  Type and screen [place]     Status: None (Preliminary result)   Collection Time: 04/15/16  7:43 PM  Result Value Ref Range   ISSUE DATE / TIME [id number]    Blood Product Unit Number [id number]    PRODUCT CODE [id number]    Unit Type and Rh 9500    Blood Product Expiration Date [id number]   ABO/Rh     Status: None   Collection Time: 04/15/16  7:43 PM  Result Value Ref Range   ABO/RH(D) O NEG   POC occult blood, ED RN will collect     Status: Abnormal   Collection Time: 04/16/16  2:01 AM  Result Value Ref Range   Fecal Occult Bld POSITIVE (A) NEGATIVE  Prepare RBC     Status: None   Collection Time: 04/16/16  2:52 AM  Result Value Ref Range   Order Confirmation ORDER PROCESSED BY BLOOD BANK   CBG monitoring, ED     Status: None   Collection Time: 04/16/16  5:34 AM  Result Value Ref Range   Glucose-Capillary 88 65 - 99 mg/dL   Ct Abdomen Pelvis W Contrast  Result Date: 04/16/2016 CLINICAL DATA:  Generalized weakness and fatigue. EXAM: CT ABDOMEN AND PELVIS WITH CONTRAST TECHNIQUE: Multidetector CT imaging of the abdomen and  pelvis was performed using the standard protocol following bolus administration of intravenous contrast. CONTRAST:  138m ISOVUE-300 IOPAMIDOL (ISOVUE-300) INJECTION 61% COMPARISON:  01/01/2016 FINDINGS: Lower chest: No acute abnormality. Hepatobiliary: No focal liver abnormality is seen. No gallstones, gallbladder wall thickening, or biliary dilatation. Pancreas: Unremarkable. No pancreatic ductal dilatation or surrounding inflammatory changes. Spleen: Normal in size without focal abnormality. Adrenals/Urinary Tract: Adrenal glands are unremarkable. Kidneys are normal, without renal calculi, focal lesion, or hydronephrosis. Bladder is unremarkable. Stomach/Bowel: Stomach, small bowel and appendix are normal. Moderate colonic diverticulosis. Partial sigmoidectomy with end-to-side anastomosis in the low pelvic midline, appearing unremarkable. Previously observed colovesical and colovaginal fistulas are no longer evident. Vascular/Lymphatic: The abdominal aorta is normal in caliber with moderate atherosclerotic calcification. No pathologic adenopathy is evident in the abdomen or pelvis. Reproductive: Status post hysterectomy. No adnexal masses. Other: Subcutaneous stranding in the right lower quadrant may represent prior ostomy site or prior surgical incision. No drainable collection. Small fat containing umbilical hernia. No ascites. No acute inflammatory changes within the abdomen or pelvis. Musculoskeletal: No significant skeletal lesion. Moderately severe degenerative lumbar disc and facet disease, with grade 1 degenerative spondylolisthesis at L4-5, unchanged. IMPRESSION: 1. Since the study of 01/01/2016, the patient has undergone partial sigmoidectomy, and the fistulas are no longer evident. Unremarkable appearances of the sigmoid anastomosis. 2. Mild stranding opacities in the subcutaneous right lower quadrant, likely related to the prior ostomy or prior surgery. No abscess or drainable collection. 3. No acute  inflammatory changes are evident within the abdomen or pelvis. 4. Colonic diverticulosis. 5. Small fat containing umbilical hernia. 6. Moderately severe lumbar degenerative disc and facet disease. Electronically Signed   By: DAndreas NewportM.D.   On: 04/16/2016 01:45  Assessment: GI bleeding source of unclear Plan:  Will begin workup with EGD tomorrow morning. Will allow diet today. Dillyn Menna C 04/16/2016, 11:46 AM  Pager [phone] If no answer or after 5 PM call [phone]

## 2016-04-16 NOTE — Progress Notes (Signed)
Patient admitted earlier this morning. Seen and examined. H&P reviewed.  Patient denies any complaints at this time. Denies any nausea, vomiting. She doesn't pay any attention to her stools so cannot tell me if she has had any black stools or blood in the stools. Her husband is at the bedside.  Vital signs are all stable.  Labs reviewed.  Patient here with complaints of weakness and found to have anemia with hemoglobin lower than her baseline. She has heme positive stool. She is on anticoagulation at home for Atrial fibrillation. This is on hold. Discussed with Dr. Amedeo Plenty with gastroenterology who will consult. Continue PPI. She has been transfused 1 unit of blood. Repeat CBC is pending. CT scan report reviewed. We will continue to follow.  Krystal Clarke 04/16/2016

## 2016-04-16 NOTE — Telephone Encounter (Signed)
Left message giving patient results and instructions.   

## 2016-04-16 NOTE — ED Notes (Signed)
GI at bedside; sts pt can eat; diet ordered

## 2016-04-16 NOTE — H&P (Signed)
History and Physical  Patient Name: Krystal Clarke     [id number]    DOB: [birth year]    DOA: 04/15/2016 PCP: Vidal Schwalbe, MD  GI: Dr. Annette Stable   Patient coming from: Home  Chief Complaint: Weakness, abnormal lab  HPI: Krystal Clarke is a 81 y.o. female with a past medical history significant for Sjogren's syndrome, chronic migraines, Afib and stroke on apixaban, HTN, and NIDDM who presents with few weeks progressive weakness and abnormal labs.  Over the last 1-2 weeks or so, the patient has noticed that she has less and less energy, and is "wiped out"/globally weak.  Today, she saw her PCP for a routine appointment, had labs because she complained of this incidental symptom, and then went home.  As she was getting out of the car she tripped on her scarf and fell and was so weak she couldn't stand, so EMS were called but she refused transport.  Later that evening, her PCP called and told her her Hgb was "7" and she came to the ER.  There has been no hematochezia, melena, hematemesis, vaginal bleeding, epistaxis.  She takes 1 Excedrin about every other day, has for months or years ("nothing else works for my headaches").  Takes her apixaban regularly since 2014-2015.  Has had diverticular bleeding in the past she thinks, no ulcer disease.  ED course: -Afebrile, heart rate 82, respirations and pulse is normal, blood pressure 107/68 -Na 135, K 3.6, Cr 0.71, WBC 9.3K, Hgb 7.4 (previously 9.8 two months ago) -FOBT positive, with dark stool on exam -CT abdomen and pelvis showed changes s/p sigmoidectomy for colovesicular fistula last November, no new findings or inflammation        ROS: Review of Systems  Constitutional: Positive for malaise/fatigue.  Musculoskeletal: Positive for falls.  Neurological: Positive for weakness.  All other systems reviewed and are negative.         Past Medical History:  Diagnosis Date  . Acute respiratory failure with hypoxia ([place])  12/06/2012  . Allergy   . Blindness of right eye   . Complication of anesthesia   . Dehydration with hyponatremia 12/07/2012  . Diabetes mellitus without complication ([place])    diagnosed 2 weeks ago- No CBG meter as of yet.  Marland Kitchen DIVERTICULITIS, HX OF 01/27/2009   Qualifier: Diagnosis of  By: Jerold Coombe    . Gait disorder 06/05/2012  . GERD (gastroesophageal reflux disease)   . Glaucoma   . Glucose intolerance (impaired glucose tolerance)   . History of diverticulitis   . Hypertension   . Legally blind   . Migraine triggered seizures ([place])    history of  . Migraines   . Neuropathy ([place])   . OP (osteoporosis)   . Osteoarthritis    osteoarthritis -right knee. uses walker. Left shoulder -"Limited ROM"  . Paroxysmal atrial fibrillation ([place]) 12/06/2012   Echo (12/07/12): Moderate focal basal hypertrophy of the septum, vigorous LV function, EF 65-70%, indeterminate diastolic function, normal wall motion, trivial MR, moderate LAE, trivial TR;  Xarelto started 11/2012  . Peripheral neuropathy ([facility])    neuropathy  . Pneumonia   . PONV (postoperative nausea and vomiting)    no problems other past few surgeries  . SHINGLES, HX OF 07/17/2009   Qualifier: Diagnosis of  By: Jerold Coombe    . Siriasis   . Sjogren's syndrome ([place])    bilateral vision  . Sleep apnea    does not wear CPAP  . Stroke (  [place])    residual - rare Intermittent expressive aphasia,    Past Surgical History:  Procedure Laterality Date  . BONE TUMOR EXCISION Right    arm  . CATARACT EXTRACTION    . COLONOSCOPY WITH PROPOFOL N/A 06/09/2015   Procedure: COLONOSCOPY WITH PROPOFOL;  Surgeon: Ronald Lobo, MD;  Location: [facility];  Service: Endoscopy;  Laterality: N/A;  . DILATION AND CURETTAGE OF UTERUS    . ESOPHAGOGASTRODUODENOSCOPY (EGD) WITH PROPOFOL N/A 06/09/2015   Procedure: ESOPHAGOGASTRODUODENOSCOPY (EGD) WITH PROPOFOL;  Surgeon: Ronald Lobo, MD;  Location: [facility];  Service: Endoscopy;   Laterality: N/A;  . EYE SURGERY    . FOOT FRACTURE SURGERY Left 2007  . KNEE ARTHROSCOPY Right   . LUMBAR LAMINECTOMY    . ROBOT ASSISTED LAPAROSCOPIC PARTIAL COLECTOMY  02/07/2016   Procedure: ROBOT ASSISTED LAPAROSCOPIC PARTIAL COLECTOMY;  Surgeon: Leighton Ruff, MD;  Location: [facility];  Service: General;;  . SHOULDER OPEN ROTATOR CUFF REPAIR    . TONSILLECTOMY AND ADENOIDECTOMY    . TOTAL ABDOMINAL HYSTERECTOMY W/ BILATERAL SALPINGOOPHORECTOMY      Social History: Patient lives in [place] with her husband.  The patient walks with a cane or walker.  She was a Statistician.  Went to [employer].  Former smoker.    Allergies  Allergen Reactions  . Oxycodone-Aspirin Nausea And Vomiting  . Codeine Nausea And Vomiting  . Hydrocodone Nausea And Vomiting  . Morphine And Related Nausea And Vomiting  . Oxycodone Nausea And Vomiting  . Pilocarpine Other (See Comments)    Reaction:  Unknown   . Rivaroxaban Other (See Comments)    Reaction:  Back pain/headaches     Family history: family history includes Lymphoma in her daughter; Migraines in her daughter and mother; Scleroderma in her sister; Sjogren's syndrome in her brother, brother, and father; Stomach cancer in her father; Stroke in her mother.  Prior to Admission medications   Medication Sig Start Date End Date Taking? Authorizing Provider  apixaban (ELIQUIS) 5 MG TABS tablet Take 1 tablet (5 mg total) by mouth 2 (two) times daily. 01/17/16   Lelon Perla, MD  cyclobenzaprine (FLEXERIL) 5 MG tablet Take 1 tablet (5 mg total) by mouth at bedtime. 11/08/15   Donika K Patel, DO  diltiazem (CARDIZEM CD) 180 MG 24 hr capsule TAKE 1 CAPSULE (180 MG TOTAL) BY MOUTH DAILY. 03/07/16   Lelon Perla, MD  docusate sodium (COLACE) 100 MG capsule Take 100 mg by mouth at bedtime.     Historical Provider, MD  dorzolamide (TRUSOPT) 2 % ophthalmic solution Place 1 drop into both eyes 2 (two) times daily.    Historical Provider, MD    fenofibrate (TRICOR) 145 MG tablet Take 145 mg by mouth at bedtime.     Historical Provider, MD  metFORMIN (GLUCOPHAGE-XR) 500 MG 24 hr tablet Take 500 mg by mouth every evening.     Historical Provider, MD  omeprazole-sodium bicarbonate (ZEGERID) 40-1100 MG capsule Take 1 capsule by mouth daily before breakfast.    Historical Provider, MD  ondansetron (ZOFRAN) 4 MG tablet Take 4 mg by mouth every 8 (eight) hours as needed for nausea or vomiting.    Historical Provider, MD  pregabalin (LYRICA) 150 MG capsule Take 150-300 mg by mouth 2 (two) times daily. Pt takes one capsule every morning and two at bedtime.    Historical Provider, MD  promethazine (PHENERGAN) 25 MG tablet Take 12.5 mg by mouth every 6 (six) hours as needed for nausea  or vomiting.     Historical Provider, MD  timolol (BETIMOL) 0.5 % ophthalmic solution Place 1 drop into both eyes 2 (two) times daily.     Historical Provider, MD  traMADol (ULTRAM) 50 MG tablet Take 1-2 tablets (50-100 mg total) by mouth every 6 (six) hours as needed for moderate pain or severe pain. 0000000   Leighton Ruff, MD  traZODone (DESYREL) 50 MG tablet Take 25-50 mg by mouth at bedtime as needed for sleep.    Historical Provider, MD       Physical Exam: BP 123/58   Pulse 88   Temp 99.1 F (37.3 C) (Oral)   Resp 18   SpO2 97%  General appearance: Well-developed, elderly adult female, alert and in no acute distress.   Eyes: Anicteric, conjunctiva pale lids and lashes normal. PERRL.    ENT: No nasal deformity, discharge, epistaxis.  Hearing normal. OP moist without lesions.   Neck: No neck masses.  Trachea midline.  No thyromegaly/tenderness. Lymph: No cervical or supraclavicular lymphadenopathy. Skin: Warm and dry.  Pallor noted.  No jaundice.  No suspicious rashes or lesions. Cardiac: RRR, nl S1-S2, no murmurs appreciated.  Capillary refill is brisk.  JVP not visible.  No LE edema.  Radial pulses 2+ and symmetric. Respiratory: Normal respiratory  rate and rhythm.  CTAB without rales or wheezes. Abdomen: Abdomen soft.  No focal  TTP or guarding. No ascites, distension, hepatosplenomegaly.   MSK: No deformities or effusions.  No cyanosis or clubbing. Neuro: Cranial nerves grossly nromal.  Sensation intact to light touch. Speech is fluent.  Muscle strength globally weak.    Psych: Sensorium intact and responding to questions, attention normal.  Behavior appropriate.  Affect normal.  Judgment and insight appear normal.     Labs on Admission:  I have personally reviewed following labs and imaging studies: CBC:  Recent Labs Lab 04/15/16 1941  WBC 9.3  NEUTROABS 6.2  HGB 7.4*  HCT 25.1*  MCV 74.0*  PLT 123456   Basic Metabolic Panel:  Recent Labs Lab 04/15/16 1941  NA 135  K 3.6  CL 102  CO2 24  GLUCOSE 112*  BUN 11  CREATININE 0.71  CALCIUM 10.1   GFR: Estimated Creatinine Clearance: 54.2 mL/min (by C-G formula based on SCr of 0.71 mg/dL).  Liver Function Tests:  Recent Labs Lab 04/15/16 1941  AST 21  ALT 10*  ALKPHOS 41  BILITOT 0.3  PROT 5.8*  ALBUMIN 3.3*   No results for input(s): LIPASE, AMYLASE in the last 168 hours. No results for input(s): AMMONIA in the last 168 hours. Coagulation Profile: No results for input(s): INR, PROTIME in the last 168 hours. Cardiac Enzymes: No results for input(s): CKTOTAL, CKMB, CKMBINDEX, TROPONINI in the last 168 hours. BNP (last 3 results) No results for input(s): PROBNP in the last 8760 hours. HbA1C: No results for input(s): HGBA1C in the last 72 hours. CBG: No results for input(s): GLUCAP in the last 168 hours. Lipid Profile: No results for input(s): CHOL, HDL, LDLCALC, TRIG, CHOLHDL, LDLDIRECT in the last 72 hours. Thyroid Function Tests: No results for input(s): TSH, T4TOTAL, FREET4, T3FREE, THYROIDAB in the last 72 hours. Anemia Panel: No results for input(s): VITAMINB12, FOLATE, FERRITIN, TIBC, IRON, RETICCTPCT in the last 72 hours. Sepsis Labs: Invalid  input(s): PROCALCITONIN, LACTICIDVEN No results found for this or any previous visit (from the past 240 hour(s)).       Radiological Exams on Admission: Personally reviewed CT abdomen report: Ct Abdomen Pelvis W  Contrast  Result Date: 04/16/2016 CLINICAL DATA:  Generalized weakness and fatigue. EXAM: CT ABDOMEN AND PELVIS WITH CONTRAST TECHNIQUE: Multidetector CT imaging of the abdomen and pelvis was performed using the standard protocol following bolus administration of intravenous contrast. CONTRAST:  148mL ISOVUE-300 IOPAMIDOL (ISOVUE-300) INJECTION 61% COMPARISON:  01/01/2016 FINDINGS: Lower chest: No acute abnormality. Hepatobiliary: No focal liver abnormality is seen. No gallstones, gallbladder wall thickening, or biliary dilatation. Pancreas: Unremarkable. No pancreatic ductal dilatation or surrounding inflammatory changes. Spleen: Normal in size without focal abnormality. Adrenals/Urinary Tract: Adrenal glands are unremarkable. Kidneys are normal, without renal calculi, focal lesion, or hydronephrosis. Bladder is unremarkable. Stomach/Bowel: Stomach, small bowel and appendix are normal. Moderate colonic diverticulosis. Partial sigmoidectomy with end-to-side anastomosis in the low pelvic midline, appearing unremarkable. Previously observed colovesical and colovaginal fistulas are no longer evident. Vascular/Lymphatic: The abdominal aorta is normal in caliber with moderate atherosclerotic calcification. No pathologic adenopathy is evident in the abdomen or pelvis. Reproductive: Status post hysterectomy. No adnexal masses. Other: Subcutaneous stranding in the right lower quadrant may represent prior ostomy site or prior surgical incision. No drainable collection. Small fat containing umbilical hernia. No ascites. No acute inflammatory changes within the abdomen or pelvis. Musculoskeletal: No significant skeletal lesion. Moderately severe degenerative lumbar disc and facet disease, with grade 1  degenerative spondylolisthesis at L4-5, unchanged. IMPRESSION: 1. Since the study of 01/01/2016, the patient has undergone partial sigmoidectomy, and the fistulas are no longer evident. Unremarkable appearances of the sigmoid anastomosis. 2. Mild stranding opacities in the subcutaneous right lower quadrant, likely related to the prior ostomy or prior surgery. No abscess or drainable collection. 3. No acute inflammatory changes are evident within the abdomen or pelvis. 4. Colonic diverticulosis. 5. Small fat containing umbilical hernia. 6. Moderately severe lumbar degenerative disc and facet disease. Electronically Signed   By: Andreas Newport M.D.   On: 04/16/2016 01:45        Assessment/Plan  1. GI bleed with symptomatic anemia:  Hemodynamically stable, appears benign, no bleeding apparent in ER.  EDP reports dark stool, ?melena, no gross blood.     -PPI IV -NPO and MIVF -Consult to GI, appreciate recommendations -Transfuse one unit for symptomatic anemia and repeat CBC afterwards -Hold apixaban   2. Atrial fibrillation:  CHADS2Vasc 7.  On apixaban. -Hold apixaban given ?GI bleed -Continue diltiazem  3. Hypertension:  Normotensive at admission.  4. Diabetes:  -Hold metformin -SSI q6hrs  5. Migraines:  -Avoid NSAIDs  6. Sjogrens' syndrome:   7. Other medications:  -Continue pregabalin     DVT prophylaxis: SCDs  Code Status: FULL  Family Communication: None present  Disposition Plan: Anticipate PPI and NPO and consult to [facility] GI Consults called: None overnight Admission status: INPATIENT    Medical decision making: Patient seen at 2:45 AM on 04/16/2016.  The patient was discussed with Irena Cords, PA-C.  What exists of the patient's chart was reviewed in depth and summarized above.  Clinical condition: stable.        Edwin Dada [facility] Pager [phone]      At the time of admission, it appears that the appropriate admission status  for this patient is INPATIENT. This is judged to be reasonable and necessary in order to provide the required intensity of service to ensure the patient's safety given the presenting symptoms, physical exam findings, and initial radiographic and laboratory data in the context of their chronic comorbidities.  Together, these circumstances are felt to place her at high risk for  further clinical deterioration threatening life, limb, or organ.   Patient requires inpatient status due to high intensity of service, high risk for further deterioration and high frequency of surveillance required because of this acute illness that poses a threat to life, limb or bodily function.  Factors that support inpatient status include presentation with Symptomatic anemia Drop in Hgb from 9.8 baseline within the last 2 months to 7.4 g/dL Positive occult blood fecal testing Use of blood thinner (apixaban)  I certify that at the point of admission it is my clinical judgment that the patient will require inpatient hospital care spanning beyond 2 midnights from the point of admission and that early discharge would result in unnecessary risk of decompensation and readmission or threat to life, limb or bodily function.

## 2016-04-16 NOTE — ED Notes (Signed)
Patient transported to CT 

## 2016-04-16 NOTE — ED Notes (Signed)
Ordered an IV pump/channel to administer Saline infusion.

## 2016-04-17 ENCOUNTER — Encounter (HOSPITAL_COMMUNITY): Payer: Self-pay

## 2016-04-17 ENCOUNTER — Encounter (HOSPITAL_COMMUNITY)
Admission: EM | Disposition: A | Discharge status: Home / Self Care | Payer: Self-pay | Source: Home / Self Care | Attending: Internal Medicine

## 2016-04-17 ENCOUNTER — Inpatient Hospital Stay (HOSPITAL_COMMUNITY): Payer: Medicare Other | Admitting: Anesthesiology

## 2016-04-17 DIAGNOSIS — I10 Essential (primary) hypertension: Secondary | ICD-10-CM

## 2016-04-17 DIAGNOSIS — E119 Type 2 diabetes mellitus without complications: Secondary | ICD-10-CM

## 2016-04-17 DIAGNOSIS — K922 Gastrointestinal hemorrhage, unspecified: Principal | ICD-10-CM

## 2016-04-17 DIAGNOSIS — G43909 Migraine, unspecified, not intractable, without status migrainosus: Secondary | ICD-10-CM

## 2016-04-17 HISTORY — PX: GIVENS CAPSULE STUDY: SHX5432

## 2016-04-17 HISTORY — PX: ESOPHAGOGASTRODUODENOSCOPY: SHX5428

## 2016-04-17 LAB — CBC
HEMATOCRIT: 24.6 % — AB (ref 36.0–46.0)
HEMATOCRIT: 29.9 % — AB (ref 36.0–46.0)
HEMOGLOBIN: 7.2 g/dL — AB (ref 12.0–15.0)
Hemoglobin: 9 g/dL — ABNORMAL LOW (ref 12.0–15.0)
MCH: 22.3 pg — ABNORMAL LOW (ref 26.0–34.0)
MCH: 23.4 pg — AB (ref 26.0–34.0)
MCHC: 29.3 g/dL — ABNORMAL LOW (ref 30.0–36.0)
MCHC: 30.1 g/dL (ref 30.0–36.0)
MCV: 76.2 fL — AB (ref 78.0–100.0)
MCV: 77.9 fL — AB (ref 78.0–100.0)
Platelets: 192 10*3/uL (ref 150–400)
Platelets: 215 10*3/uL (ref 150–400)
RBC: 3.23 MIL/uL — ABNORMAL LOW (ref 3.87–5.11)
RBC: 3.84 MIL/uL — AB (ref 3.87–5.11)
RDW: 19.6 % — ABNORMAL HIGH (ref 11.5–15.5)
RDW: 19.6 % — ABNORMAL HIGH (ref 11.5–15.5)
WBC: 7.2 10*3/uL (ref 4.0–10.5)
WBC: 8.5 10*3/uL (ref 4.0–10.5)

## 2016-04-17 LAB — BASIC METABOLIC PANEL
Anion gap: 5 (ref 5–15)
BUN: 10 mg/dL (ref 6–20)
CALCIUM: 9.3 mg/dL (ref 8.9–10.3)
CHLORIDE: 106 mmol/L (ref 101–111)
CO2: 26 mmol/L (ref 22–32)
Creatinine, Ser: 0.7 mg/dL (ref 0.44–1.00)
GFR calc non Af Amer: >60 mL/min (ref >60)
GLUCOSE: 101 mg/dL — AB (ref 65–99)
POTASSIUM: 3.9 mmol/L (ref 3.5–5.1)
Sodium: 137 mmol/L (ref 135–145)

## 2016-04-17 LAB — GLUCOSE, CAPILLARY
GLUCOSE-CAPILLARY: 95 mg/dL (ref 65–99)
Glucose-Capillary: 94 mg/dL (ref 65–99)

## 2016-04-17 LAB — PREPARE RBC (CROSSMATCH)

## 2016-04-17 SURGERY — EGD (ESOPHAGOGASTRODUODENOSCOPY)
Anesthesia: Monitor Anesthesia Care

## 2016-04-17 MED ORDER — DIPHENHYDRAMINE HCL 50 MG/ML IJ SOLN
25.0000 mg | Freq: Once | INTRAMUSCULAR | Status: AC
  Administered 2016-04-17: 25 mg via INTRAVENOUS
  Filled 2016-04-17: qty 1

## 2016-04-17 MED ORDER — FUROSEMIDE 10 MG/ML IJ SOLN
20.0000 mg | Freq: Once | INTRAMUSCULAR | Status: AC
  Administered 2016-04-17: 20 mg via INTRAVENOUS
  Filled 2016-04-17: qty 2

## 2016-04-17 MED ORDER — APIXABAN 5 MG PO TABS
5.0000 mg | ORAL_TABLET | Freq: Two times a day (BID) | ORAL | Status: DC
  Administered 2016-04-17 – 2016-04-18 (×2): 5 mg via ORAL
  Filled 2016-04-17 (×2): qty 1

## 2016-04-17 MED ORDER — WHITE PETROLATUM GEL
Status: AC
  Administered 2016-04-17: 16:00:00
  Filled 2016-04-17: qty 1

## 2016-04-17 MED ORDER — PROPOFOL 500 MG/50ML IV EMUL
INTRAVENOUS | Status: DC | PRN
  Administered 2016-04-17: 75 ug/kg/min via INTRAVENOUS

## 2016-04-17 MED ORDER — SODIUM CHLORIDE 0.9 % IV SOLN
Freq: Once | INTRAVENOUS | Status: AC
  Administered 2016-04-17: 09:00:00 via INTRAVENOUS

## 2016-04-17 MED ORDER — ACETAMINOPHEN 325 MG PO TABS
650.0000 mg | ORAL_TABLET | Freq: Once | ORAL | Status: AC
  Administered 2016-04-17: 650 mg via ORAL
  Filled 2016-04-17: qty 2

## 2016-04-17 MED ORDER — BUTAMBEN-TETRACAINE-BENZOCAINE 2-2-14 % EX AERO
INHALATION_SPRAY | CUTANEOUS | Status: DC | PRN
  Administered 2016-04-17: 2 via TOPICAL

## 2016-04-17 MED ORDER — LACTATED RINGERS IV SOLN
INTRAVENOUS | Status: DC
  Administered 2016-04-17: 12:00:00 via INTRAVENOUS

## 2016-04-17 NOTE — Anesthesia Preprocedure Evaluation (Addendum)
Anesthesia Evaluation  Patient identified by MRN, date of birth, ID band Patient awake    Reviewed: Allergy & Precautions, NPO status , Patient's Chart, lab work & pertinent test results  History of Anesthesia Complications (+) PONV  Airway Mallampati: III  TM Distance: >3 FB Neck ROM: Full    Dental   Pulmonary sleep apnea , former smoker,    breath sounds clear to auscultation       Cardiovascular hypertension, Pt. on medications + dysrhythmias  Rhythm:Regular Rate:Normal     Neuro/Psych  Headaches, CVA    GI/Hepatic GERD  ,Colon CA s/p resection   Endo/Other  diabetes, Type 2, Oral Hypoglycemic Agents  Renal/GU      Musculoskeletal  (+) Arthritis ,   Abdominal   Peds  Hematology  (+) anemia ,   Anesthesia Other Findings   Reproductive/Obstetrics                            Lab Results  Component Value Date   WBC 7.2 04/17/2016   HGB 7.2 (L) 04/17/2016   HCT 24.6 (L) 04/17/2016   MCV 76.2 (L) 04/17/2016   PLT 192 04/17/2016   Lab Results  Component Value Date   CREATININE 0.70 04/17/2016   BUN 10 04/17/2016   NA 137 04/17/2016   K 3.9 04/17/2016   CL 106 04/17/2016   CO2 26 04/17/2016    Anesthesia Physical Anesthesia Plan  ASA: III  Anesthesia Plan: MAC   Post-op Pain Management:    Induction: Intravenous  Airway Management Planned: Natural Airway and Nasal Cannula  Additional Equipment:   Intra-op Plan:   Post-operative Plan:   Informed Consent: I have reviewed the patients History and Physical, chart, labs and discussed the procedure including the risks, benefits and alternatives for the proposed anesthesia with the patient or authorized representative who has indicated his/her understanding and acceptance.   Dental advisory given  Plan Discussed with:   Anesthesia Plan Comments:         Anesthesia Quick Evaluation

## 2016-04-17 NOTE — ED Provider Notes (Signed)
[facility] DEPT Provider Note   CSN: [id number] Arrival date & time: 04/15/16  1906     History   Chief Complaint Chief Complaint  Patient presents with  . Anemia    Fall    HPI Krystal Clarke is a 81 y.o. female.  HPI Patient presents to the emergency department with increasing general weakness over the last 3 weeks.  Patient states that he got to the point today where she was so weak she fell.  She states that nothing seems make the condition better, but exertion makes the condition worse.  The patient, states she has had some mild shortness of breath.  Patient states that she saw her doctor today who advised her that her hemoglobin was low.  Patient states she had a surgical procedure done 2 months ago, but had no complications from this and was not severely anemic following the procedure.  Patient is on blood thinners for atrial fibrillation.  Past Medical History:  Diagnosis Date  . Acute respiratory failure with hypoxia ([place]) 12/06/2012  . Allergy   . Blindness of right eye   . Complication of anesthesia   . Dehydration with hyponatremia 12/07/2012  . Diabetes mellitus without complication ([place])    diagnosed 2 weeks ago- No CBG meter as of yet.  Marland Kitchen DIVERTICULITIS, HX OF 01/27/2009   Qualifier: Diagnosis of  By: Jerold Coombe    . Gait disorder 06/05/2012  . GERD (gastroesophageal reflux disease)   . Glaucoma   . Glucose intolerance (impaired glucose tolerance)   . History of diverticulitis   . Hypertension   . Legally blind   . Migraine triggered seizures ([place])    history of  . Migraines   . Neuropathy ([place])   . OP (osteoporosis)   . Osteoarthritis    osteoarthritis -right knee. uses walker. Left shoulder -"Limited ROM"  . Paroxysmal atrial fibrillation ([place]) 12/06/2012   Echo (12/07/12): Moderate focal basal hypertrophy of the septum, vigorous LV function, EF 65-70%, indeterminate diastolic function, normal wall motion, trivial MR, moderate LAE, trivial TR;   Xarelto started 11/2012  . Peripheral neuropathy (H[facility])    neuropathy  . Pneumonia   . PONV (postoperative nausea and vomiting)    no problems other past few surgeries  . SHINGLES, HX OF 07/17/2009   Qualifier: Diagnosis of  By: Jerold Coombe    . Siriasis   . Sjogren's syndrome ([place])    bilateral vision  . Sleep apnea    does not wear CPAP  . Stroke [facility])    residual - rare Intermittent expressive aphasia,    Patient Active Problem List   Diagnosis Date Noted  . GI bleed 04/16/2016  . Symptomatic anemia 04/16/2016  . Hypoxia - on oxygen 02/10/2016  . Diabetes mellitus without complication ([place])   . Obesity (BMI 30-39.9) 02/08/2016  . Lichen sclerosus 0000000  . 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 02/07/2016 02/07/2016  . Chest pain 11/04/2014  . Elevated white blood cell count 11/04/2014  . B12 deficiency 05/20/2013  . Seizure disorder ([place]) 12/07/2012  . Atrial fibrillation ([place]) 12/06/2012  . Sjogren's syndrome ([place])   . Hereditary and idiopathic peripheral neuropathy 05/09/2012  . 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 ([place]) 05/09/2012  . History of detached retina repair 02/24/2012  . Primary open angle glaucoma of both eyes, severe stage 02/24/2012  . ANEMIA, B12 DEFICIENCY 07/19/2009  . Anxiety state 01/27/2009  . Sleep apnea 12/19/2008  . MIXED HYPERLIPIDEMIA 07/18/2008  .  HYPERTRIGLYCERIDEMIA 08/28/2006  . Migraine headache 08/28/2006  . Essential hypertension 08/28/2006  . GERD 08/28/2006  . PSORIASIS 08/28/2006  . Osteoarthritis 08/28/2006  . OSTEOPOROSIS 08/28/2006    Past Surgical History:  Procedure Laterality Date  . BONE TUMOR EXCISION Right    arm  . CATARACT EXTRACTION    . COLONOSCOPY WITH PROPOFOL N/A 06/09/2015   Procedure: COLONOSCOPY WITH PROPOFOL;  Surgeon: Ronald Lobo, MD;  Location: [facility];  Service: Endoscopy;  Laterality: N/A;  . DILATION AND CURETTAGE OF UTERUS    .  ESOPHAGOGASTRODUODENOSCOPY (EGD) WITH PROPOFOL N/A 06/09/2015   Procedure: ESOPHAGOGASTRODUODENOSCOPY (EGD) WITH PROPOFOL;  Surgeon: Ronald Lobo, MD;  Location: [facility];  Service: Endoscopy;  Laterality: N/A;  . EYE SURGERY    . FOOT FRACTURE SURGERY Left 2007  . KNEE ARTHROSCOPY Right   . LUMBAR LAMINECTOMY    . ROBOT ASSISTED LAPAROSCOPIC PARTIAL COLECTOMY  02/07/2016   Procedure: ROBOT ASSISTED LAPAROSCOPIC PARTIAL COLECTOMY;  Surgeon: Leighton Ruff, MD;  Location: [facility];  Service: General;;  . SHOULDER OPEN ROTATOR CUFF REPAIR    . TONSILLECTOMY AND ADENOIDECTOMY    . TOTAL ABDOMINAL HYSTERECTOMY W/ BILATERAL SALPINGOOPHORECTOMY      OB History    No data available       Home Medications    Prior to Admission medications   Medication Sig Start Date End Date Taking? Authorizing Provider  apixaban (ELIQUIS) 5 MG TABS tablet Take 1 tablet (5 mg total) by mouth 2 (two) times daily. 01/17/16  Yes Lelon Perla, MD  benzonatate (TESSALON) 100 MG capsule Take 100 mg by mouth 3 (three) times daily.   Yes Historical Provider, MD  celecoxib (CELEBREX) 200 MG capsule Take 200 mg by mouth as needed for mild pain.   Yes Historical Provider, MD  diltiazem (CARDIZEM CD) 180 MG 24 hr capsule TAKE 1 CAPSULE (180 MG TOTAL) BY MOUTH DAILY. 03/07/16  Yes Lelon Perla, MD  dorzolamide (TRUSOPT) 2 % ophthalmic solution Place 1 drop into both eyes 2 (two) times daily.   Yes Historical Provider, MD  fenofibrate (TRICOR) 145 MG tablet Take 145 mg by mouth at bedtime.    Yes Historical Provider, MD  metFORMIN (GLUCOPHAGE-XR) 500 MG 24 hr tablet Take 500 mg by mouth every evening.    Yes Historical Provider, MD  omeprazole-sodium bicarbonate (ZEGERID) 40-1100 MG capsule Take 1 capsule by mouth daily before breakfast.   Yes Historical Provider, MD  ondansetron (ZOFRAN) 4 MG tablet Take 4 mg by mouth every 8 (eight) hours as needed for nausea or vomiting.   Yes Historical Provider, MD    pregabalin (LYRICA) 150 MG capsule Take 150-300 mg by mouth 2 (two) times daily. Pt takes one capsule every morning and two at bedtime.   Yes Historical Provider, MD  promethazine (PHENERGAN) 25 MG tablet Take 12.5 mg by mouth every 6 (six) hours as needed for nausea or vomiting.    Yes Historical Provider, MD  timolol (BETIMOL) 0.5 % ophthalmic solution Place 1 drop into both eyes 2 (two) times daily.    Yes Historical Provider, MD  traMADol (ULTRAM) 50 MG tablet Take 1-2 tablets (50-100 mg total) by mouth every 6 (six) hours as needed for moderate pain or severe pain. 0000000  Yes Leighton Ruff, MD  traZODone (DESYREL) 50 MG tablet Take 25-50 mg by mouth at bedtime as needed for sleep.   Yes Historical Provider, MD    Family History Family History  Problem Relation Age of Onset  .  Stroke Mother   . Migraines Mother   . Stomach cancer Father   . Sjogren's syndrome Father   . Sjogren's syndrome Brother   . Sjogren's syndrome Brother   . Scleroderma Sister     raynaud's scleroderma  . Lymphoma Daughter   . Migraines Daughter   . Heart disease      maternal side  . Stroke      maternal side, 1st degree female <60    Social History Social History  Substance Use Topics  . Smoking status: Former Smoker    Types: Cigarettes  . Smokeless tobacco: Never Used     Comment: quit smoking before age of 61  . Alcohol use No     Allergies   Oxycodone-aspirin; Codeine; Hydrocodone; Morphine and related; Oxycodone; Pilocarpine; and Rivaroxaban   Review of Systems Review of Systems All other systems negative except as documented in the HPI. All pertinent positives and negatives as reviewed in the HPI.  Physical Exam Updated Vital Signs BP 132/90 (BP Location: Left Arm)   Pulse 96   Temp 98.2 F (36.8 C) (Oral)   Resp 19   Ht 5\' 1"  (1.549 m)   Wt 76.2 kg   SpO2 98%   BMI 31.74 kg/m   Physical Exam  Constitutional: She is oriented to person, place, and time. She appears  well-developed and well-nourished. No distress.  HENT:  Head: Normocephalic and atraumatic.  Mouth/Throat: Oropharynx is clear and moist.  Eyes: Pupils are equal, round, and reactive to light.  Neck: Normal range of motion. Neck supple.  Cardiovascular: Normal rate, regular rhythm and normal heart sounds.  Exam reveals no gallop and no friction rub.   No murmur heard. Pulmonary/Chest: Effort normal and breath sounds normal. No respiratory distress. She has no wheezes.  Abdominal: Soft. Bowel sounds are normal. She exhibits no distension and no mass. There is tenderness. There is no rebound and no guarding.  Neurological: She is alert and oriented to person, place, and time. She exhibits normal muscle tone. Coordination normal.  Skin: Skin is warm and dry. No rash noted. No erythema.  Psychiatric: She has a normal mood and affect. Her behavior is normal.  Nursing note and vitals reviewed.    ED Treatments / Results  Labs (all labs ordered are listed, but only abnormal results are displayed) Labs Reviewed  CBC WITH DIFFERENTIAL/PLATELET - Abnormal; Notable for the following:       Result Value   RBC 3.39 (*)    Hemoglobin 7.4 (*)    HCT 25.1 (*)    MCV 74.0 (*)    MCH 21.8 (*)    MCHC 29.5 (*)    RDW 19.1 (*)    All other components within normal limits  COMPREHENSIVE METABOLIC PANEL - Abnormal; Notable for the following:    Glucose, Bld 112 (*)    Total Protein 5.8 (*)    Albumin 3.3 (*)    ALT 10 (*)    All other components within normal limits  CBC - Abnormal; Notable for the following:    RBC 3.40 (*)    Hemoglobin 7.7 (*)    HCT 25.7 (*)    MCV 75.6 (*)    MCH 22.6 (*)    RDW 19.3 (*)    All other components within normal limits  CBC - Abnormal; Notable for the following:    RBC 3.23 (*)    Hemoglobin 7.2 (*)    HCT 24.6 (*)    MCV 76.2 (*)  MCH 22.3 (*)    MCHC 29.3 (*)    RDW 19.6 (*)    All other components within normal limits  GLUCOSE, CAPILLARY -  Abnormal; Notable for the following:    Glucose-Capillary 105 (*)    All other components within normal limits  POC OCCULT BLOOD, ED - Abnormal; Notable for the following:    Fecal Occult Bld POSITIVE (*)    All other components within normal limits  CBG MONITORING, ED - Abnormal; Notable for the following:    Glucose-Capillary 112 (*)    All other components within normal limits  GLUCOSE, CAPILLARY  GLUCOSE, CAPILLARY  BASIC METABOLIC PANEL  CBG MONITORING, ED  TYPE AND SCREEN  ABO/RH  PREPARE RBC (CROSSMATCH)    EKG  EKG Interpretation None       Radiology Ct Abdomen Pelvis W Contrast  Result Date: 04/16/2016 CLINICAL DATA:  Generalized weakness and fatigue. EXAM: CT ABDOMEN AND PELVIS WITH CONTRAST TECHNIQUE: Multidetector CT imaging of the abdomen and pelvis was performed using the standard protocol following bolus administration of intravenous contrast. CONTRAST:  185mL ISOVUE-300 IOPAMIDOL (ISOVUE-300) INJECTION 61% COMPARISON:  01/01/2016 FINDINGS: Lower chest: No acute abnormality. Hepatobiliary: No focal liver abnormality is seen. No gallstones, gallbladder wall thickening, or biliary dilatation. Pancreas: Unremarkable. No pancreatic ductal dilatation or surrounding inflammatory changes. Spleen: Normal in size without focal abnormality. Adrenals/Urinary Tract: Adrenal glands are unremarkable. Kidneys are normal, without renal calculi, focal lesion, or hydronephrosis. Bladder is unremarkable. Stomach/Bowel: Stomach, small bowel and appendix are normal. Moderate colonic diverticulosis. Partial sigmoidectomy with end-to-side anastomosis in the low pelvic midline, appearing unremarkable. Previously observed colovesical and colovaginal fistulas are no longer evident. Vascular/Lymphatic: The abdominal aorta is normal in caliber with moderate atherosclerotic calcification. No pathologic adenopathy is evident in the abdomen or pelvis. Reproductive: Status post hysterectomy. No adnexal  masses. Other: Subcutaneous stranding in the right lower quadrant may represent prior ostomy site or prior surgical incision. No drainable collection. Small fat containing umbilical hernia. No ascites. No acute inflammatory changes within the abdomen or pelvis. Musculoskeletal: No significant skeletal lesion. Moderately severe degenerative lumbar disc and facet disease, with grade 1 degenerative spondylolisthesis at L4-5, unchanged. IMPRESSION: 1. Since the study of 01/01/2016, the patient has undergone partial sigmoidectomy, and the fistulas are no longer evident. Unremarkable appearances of the sigmoid anastomosis. 2. Mild stranding opacities in the subcutaneous right lower quadrant, likely related to the prior ostomy or prior surgery. No abscess or drainable collection. 3. No acute inflammatory changes are evident within the abdomen or pelvis. 4. Colonic diverticulosis. 5. Small fat containing umbilical hernia. 6. Moderately severe lumbar degenerative disc and facet disease. Electronically Signed   By: Andreas Newport M.D.   On: 04/16/2016 01:45    Procedures Procedures (including critical care time)  Medications Ordered in ED Medications  0.9 %  sodium chloride infusion ( Intravenous Not Given 04/16/16 1226)  pantoprazole (PROTONIX) injection 40 mg (40 mg Intravenous Given 04/16/16 2126)  0.9 %  sodium chloride infusion ( Intravenous Rate/Dose Verify 04/17/16 0500)  acetaminophen (TYLENOL) tablet 650 mg (650 mg Oral Given 04/16/16 1356)    Or  acetaminophen (TYLENOL) suppository 650 mg ( Rectal See Alternative 04/16/16 1356)  ondansetron (ZOFRAN) tablet 4 mg (not administered)    Or  ondansetron (ZOFRAN) injection 4 mg (not administered)  diltiazem (CARDIZEM CD) 24 hr capsule 180 mg (180 mg Oral Given 04/16/16 1051)  insulin aspart (novoLOG) injection 0-9 Units (0 Units Subcutaneous Not Given 04/17/16 0600)  pregabalin (LYRICA)  capsule 300 mg (300 mg Oral Given 04/16/16 2126)  pregabalin (LYRICA)  capsule 150 mg (150 mg Oral Given 04/16/16 1322)  timolol (TIMOPTIC) 0.5 % ophthalmic solution 1 drop (1 drop Both Eyes Given 04/16/16 2126)  ondansetron (ZOFRAN) injection 4 mg (4 mg Intravenous Given 04/16/16 0036)  iopamidol (ISOVUE-300) 61 % injection (100 mLs Intravenous Contrast Given 04/16/16 0108)     Initial Impression / Assessment and Plan / ED Course  I have reviewed the triage vital signs and the nursing notes.  Pertinent labs & imaging results that were available during my care of the patient were reviewed by me and considered in my medical decision making (see chart for details).     I spoke with the [facility] hospitalist about admission for the patient for symptomatic anemia.  Patient is given the plan and all questions were answered  Final Clinical Impressions(s) / ED Diagnoses   Final diagnoses:  Symptomatic anemia    New Prescriptions Current Discharge Medication List       Dalia Heading, PA-C 04/17/16 [id number]    Merryl Hacker, MD 04/23/16 [phone]

## 2016-04-17 NOTE — Transfer of Care (Signed)
Immediate Anesthesia Transfer of Care Note  Patient: Krystal Clarke  Procedure(s) Performed: Procedure(s): ESOPHAGOGASTRODUODENOSCOPY (EGD) (N/A)  Patient Location: Endoscopy Unit  Anesthesia Type:MAC  Level of Consciousness: awake, alert , oriented and patient cooperative  Airway & Oxygen Therapy: Patient Spontanous Breathing and Patient connected to nasal cannula oxygen  Post-op Assessment: Report given to RN, Post -op Vital signs reviewed and stable and Patient moving all extremities X 4  Post vital signs: Reviewed and stable  Last Vitals:  Vitals:   04/17/16 1201 04/17/16 1232  BP: 119/69 (!) 105/43  Pulse: 89 75  Resp: 16 18  Temp: 37 C 36.9 C    Last Pain:  Vitals:   04/17/16 1232  TempSrc: Oral  PainSc:          Complications: No apparent anesthesia complications

## 2016-04-17 NOTE — Progress Notes (Signed)
[facility]  Patient Status: Active (receiving services up to time of hospitalization)  [facility] is providing the following services: RN and PT  If patient discharges after hours, please call [phone].   Krystal Clarke 04/17/2016, 10:30 AM

## 2016-04-17 NOTE — Progress Notes (Addendum)
PROGRESS NOTE        PATIENT DETAILS Name: Krystal Clarke Age: 81 y.o. Sex: female Date of Birth: [birth year] Admit Date: 04/15/2016 Admitting Physician Edwin Dada, MD [id number] S, MD  Brief Narrative: Patient is a 81 y.o. female with history of Sjogren's syndrome, A. fib on anticoagulation, prior history of CVA presented to the hospital with generalized weakness, and was found to have hemoglobin of 7, she was thought to have a occult GI bleeding and admitted for further evaluation and treatment. See below for further details  Subjective: Lying comfortably in bed without any major complaints.  Assessment/Plan: Probable upper GI bleed with acute blood loss anemia: GI consulted, underwent EGD on 1/31 without any major bleeding foci. She had a colonoscopy last year that was unremarkable. GI planning on Capsule Endoscopy. Recommendations are to resume anticoagulation. Hemoglobin Stable at 7.2,Will go ahead and transfuse 1 unit PRBC Today. Plans Are to Monitor Overnight and Follow CBC.  Atrial fibrillation: Rate controlled with Cardizem-we will resume Eliquis-per GI recommendations.CHADS2Vasc 7.   Hypertension: BP controlled-continue Cardizem  Type 2 diabetes: CBGs stable with SSI, resume metformin on discharge  Migraine headaches: Continue Lyrica. Currently stable-we'll need to avoid NSAIDs as on anticoagulation and suspicion for upper GI bleeding  History of Sjogren syndrome  History of 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 02/07/2016  DVT Prophylaxis: Full dose anticoagulation with Eliquis  Code Status: Full code  Family Communication: None at bedside  Disposition Plan: Remain inpatient-home on 2/1-if stable  Antimicrobial agents: Anti-infectives    None      Procedures: EGD 1/31  CONSULTS:  GI  Time spent: 25 minutes-Greater than 50% of this time was spent in counseling, explanation of  diagnosis, planning of further management, and coordination of care.  MEDICATIONS: Scheduled Meds: . sodium chloride   Intravenous Once  . diltiazem  180 mg Oral Daily  . insulin aspart  0-9 Units Subcutaneous Q6H  . pantoprazole  40 mg Intravenous Q12H  . pregabalin  150 mg Oral Daily  . pregabalin  300 mg Oral QHS  . timolol  1 drop Both Eyes BID  . white petrolatum       Continuous Infusions: PRN Meds:.acetaminophen **OR** acetaminophen, ondansetron **OR** ondansetron (ZOFRAN) IV   PHYSICAL EXAM: Vital signs: Vitals:   04/17/16 1247 04/17/16 1250 04/17/16 1300 04/17/16 1310  BP:  127/62 118/66 129/68  Pulse:  80 76 83  Resp:  18 19 19   Temp:      TempSrc:      SpO2:  99% 95% 95%  Weight: 76.2 kg (168 lb)     Height: 5\' 1"  (1.549 m)      Filed Weights   04/16/16 2123 04/17/16 1137 04/17/16 1247  Weight: 76.2 kg (168 lb) 76.2 kg (168 lb) 76.2 kg (168 lb)   Body mass index is 31.74 kg/m.   General appearance :Awake, alert, not in any distress. Speech Clear. Not toxic Looking Eyes:, pupils equally reactive to light and accomodation,no scleral icterus. HEENT: Atraumatic and Normocephalic Neck: supple, no JVD. No cervical lymphadenopathy. No thyromegaly Resp:Good air entry bilaterally, no added sounds  CVS: S1 S2 regular, no murmurs.  GI: Bowel sounds present, Non tender and not distended with no gaurding, rigidity or rebound.No organomegaly Extremities: B/L Lower Ext shows no edema, both legs are warm to touch Neurology:  speech clear,Non focal, sensation is grossly intact. Psychiatric: Normal judgment and insight. Alert and oriented x 3. Normal mood. Musculoskeletal:No digital cyanosis Skin:No Rash, warm and dry Wounds:N/A  I have personally reviewed following labs and imaging studies  LABORATORY DATA: CBC:  Recent Labs Lab 04/15/16 1941 04/16/16 1659 04/17/16 0512  WBC 9.3 9.1 7.2  NEUTROABS 6.2  --   --   HGB 7.4* 7.7* 7.2*  HCT 25.1* 25.7* 24.6*  MCV  74.0* 75.6* 76.2*  PLT 213 195 [id number]    Basic Metabolic Panel:  Recent Labs Lab 04/15/16 1941 04/17/16 0512  NA 135 137  K 3.6 3.9  CL 102 106  CO2 24 26  GLUCOSE 112* 101*  BUN 11 10  CREATININE 0.71 0.70  CALCIUM 10.1 9.3    GFR: Estimated Creatinine Clearance: 54.2 mL/min (by C-G formula based on SCr of 0.7 mg/dL).  Liver Function Tests:  Recent Labs Lab 04/15/16 1941  AST 21  ALT 10*  ALKPHOS 41  BILITOT 0.3  PROT 5.8*  ALBUMIN 3.3*   No results for input(s): LIPASE, AMYLASE in the last 168 hours. No results for input(s): AMMONIA in the last 168 hours.  Coagulation Profile: No results for input(s): INR, PROTIME in the last 168 hours.  Cardiac Enzymes: No results for input(s): CKTOTAL, CKMB, CKMBINDEX, TROPONINI in the last 168 hours.  BNP (last 3 results) No results for input(s): PROBNP in the last 8760 hours.  HbA1C: No results for input(s): HGBA1C in the last 72 hours.  CBG:  Recent Labs Lab 04/16/16 1317 04/16/16 1808 04/16/16 2347 04/17/16 0619 04/17/16 1401  GLUCAP 112* 96 105* 94 95    Lipid Profile: No results for input(s): CHOL, HDL, LDLCALC, TRIG, CHOLHDL, LDLDIRECT in the last 72 hours.  Thyroid Function Tests: No results for input(s): TSH, T4TOTAL, FREET4, T3FREE, THYROIDAB in the last 72 hours.  Anemia Panel: No results for input(s): VITAMINB12, FOLATE, FERRITIN, TIBC, IRON, RETICCTPCT in the last 72 hours.  Urine analysis:    Component Value Date/Time   COLORURINE AMBER (A) 10/20/2014 1310   APPEARANCEUR HAZY (A) 10/20/2014 1310   LABSPEC 1.026 10/20/2014 1310   PHURINE 5.5 10/20/2014 1310   GLUCOSEU NEGATIVE 10/20/2014 1310   HGBUR NEGATIVE 10/20/2014 1310   HGBUR negative 09/25/2009 0928   BILIRUBINUR SMALL (A) 10/20/2014 1310   KETONESUR 15 (A) 10/20/2014 1310   PROTEINUR 100 (A) 10/20/2014 1310   UROBILINOGEN 1.0 10/20/2014 1310   NITRITE NEGATIVE 10/20/2014 1310   LEUKOCYTESUR SMALL (A) 10/20/2014 1310     Sepsis Labs: Lactic Acid, Venous No results found for: LATICACIDVEN  MICROBIOLOGY: No results found for this or any previous visit (from the past 240 hour(s)).  RADIOLOGY STUDIES/RESULTS: Ct Abdomen Pelvis W Contrast  Result Date: 04/16/2016 CLINICAL DATA:  Generalized weakness and fatigue. EXAM: CT ABDOMEN AND PELVIS WITH CONTRAST TECHNIQUE: Multidetector CT imaging of the abdomen and pelvis was performed using the standard protocol following bolus administration of intravenous contrast. CONTRAST:  128mL ISOVUE-300 IOPAMIDOL (ISOVUE-300) INJECTION 61% COMPARISON:  01/01/2016 FINDINGS: Lower chest: No acute abnormality. Hepatobiliary: No focal liver abnormality is seen. No gallstones, gallbladder wall thickening, or biliary dilatation. Pancreas: Unremarkable. No pancreatic ductal dilatation or surrounding inflammatory changes. Spleen: Normal in size without focal abnormality. Adrenals/Urinary Tract: Adrenal glands are unremarkable. Kidneys are normal, without renal calculi, focal lesion, or hydronephrosis. Bladder is unremarkable. Stomach/Bowel: Stomach, small bowel and appendix are normal. Moderate colonic diverticulosis. Partial sigmoidectomy with end-to-side anastomosis in the low pelvic midline, appearing unremarkable. Previously  observed colovesical and colovaginal fistulas are no longer evident. Vascular/Lymphatic: The abdominal aorta is normal in caliber with moderate atherosclerotic calcification. No pathologic adenopathy is evident in the abdomen or pelvis. Reproductive: Status post hysterectomy. No adnexal masses. Other: Subcutaneous stranding in the right lower quadrant may represent prior ostomy site or prior surgical incision. No drainable collection. Small fat containing umbilical hernia. No ascites. No acute inflammatory changes within the abdomen or pelvis. Musculoskeletal: No significant skeletal lesion. Moderately severe degenerative lumbar disc and facet disease, with grade 1  degenerative spondylolisthesis at L4-5, unchanged. IMPRESSION: 1. Since the study of 01/01/2016, the patient has undergone partial sigmoidectomy, and the fistulas are no longer evident. Unremarkable appearances of the sigmoid anastomosis. 2. Mild stranding opacities in the subcutaneous right lower quadrant, likely related to the prior ostomy or prior surgery. No abscess or drainable collection. 3. No acute inflammatory changes are evident within the abdomen or pelvis. 4. Colonic diverticulosis. 5. Small fat containing umbilical hernia. 6. Moderately severe lumbar degenerative disc and facet disease. Electronically Signed   By: Andreas Newport M.D.   On: 04/16/2016 01:45     LOS: 1 day   Oren Binet, MD  [facility] Pager:[phone]  If 7PM-7AM, please contact night-coverage [email or web address] Password [email or web address] 04/17/2016, 2:22 PM

## 2016-04-17 NOTE — Op Note (Signed)
[facility] Patient Name: Krystal Clarke Procedure Date : 04/17/2016 MRN: [id number] Attending MD: Missy Sabins , MD Date of Birth: 1936/01/13 CSN: [id number] Age: 81 Admit Type: Inpatient Procedure:                Upper GI endoscopy Indications:              Occult blood in stool Providers:                Elyse Jarvis. Amedeo Plenty, MD, Carolynn Comment, RN, Corliss Parish, Technician Referring MD:              Medicines:                Propofol per Anesthesia Complications:            No immediate complications. Estimated Blood Loss:     Estimated blood loss: none. Procedure:                Pre-Anesthesia Assessment:                           - Prior to the procedure, a History and Physical                            was performed, and patient medications and                            allergies were reviewed. The patient's tolerance of                            previous anesthesia was also reviewed. The risks                            and benefits of the procedure and the sedation                            options and risks were discussed with the patient.                            All questions were answered, and informed consent                            was obtained. Prior Anticoagulants: The patient has                            taken Eliquis (apixaban), last dose was 3 days                            prior to procedure. ASA Grade Assessment: III - A                            patient with severe systemic disease. After  reviewing the risks and benefits, the patient was                            deemed in satisfactory condition to undergo the                            procedure.                           After obtaining informed consent, the endoscope was                            passed under direct vision. Throughout the                            procedure, the patient's blood pressure, pulse, and                 oxygen saturations were monitored continuously. The                            [id number]) scope was introduced through the                            mouth, and advanced to the second part of duodenum.                            The upper GI endoscopy was accomplished without                            difficulty. The patient tolerated the procedure                            well. Scope In: Scope Out: Findings:      The examined esophagus was normal.      The entire examined stomach was normal.      The examined duodenum was normal. Impression:               - Normal esophagus.                           - Normal stomach.                           - Normal examined duodenum.                           - No specimens collected. Moderate Sedation:      no moderate sedation Recommendation:           - Patient has a contact number available for                            emergencies. The signs and symptoms of potential                            delayed complications were discussed with the  patient. Return to normal activities tomorrow.                            Written discharge instructions were provided to the                            patient.                           - Resume previous diet.                           - Resume Eliquis (apixaban) at prior dose today.                           - To visualize the small bowel, perform video                            capsule endoscopy today. Procedure Code(s):        --- Professional ---                           [phone], Esophagogastroduodenoscopy, flexible,                            transoral; diagnostic, including collection of                            specimen(s) by brushing or washing, when performed                            (separate procedure) Diagnosis Code(s):        --- Professional ---                           R19.5, Other fecal abnormalities CPT copyright 2016 American  Medical Association. All rights reserved. The codes documented in this report are preliminary and upon coder review may  be revised to meet current compliance requirements. Missy Sabins, MD 04/17/2016 12:30:33 PM This report has been signed electronically. Number of Addenda: 0

## 2016-04-17 NOTE — Anesthesia Postprocedure Evaluation (Signed)
Anesthesia Post Note  Patient: Krystal Clarke  Procedure(s) Performed: Procedure(s) (LRB): ESOPHAGOGASTRODUODENOSCOPY (EGD) (N/A) GIVENS CAPSULE STUDY (N/A)  Patient location during evaluation: PACU Anesthesia Type: MAC Level of consciousness: awake and alert Pain management: pain level controlled Vital Signs Assessment: post-procedure vital signs reviewed and stable Respiratory status: spontaneous breathing, nonlabored ventilation, respiratory function stable and patient connected to nasal cannula oxygen Cardiovascular status: stable and blood pressure returned to baseline Anesthetic complications: no       Last Vitals:  Vitals:   04/17/16 1300 04/17/16 1310  BP: 118/66 129/68  Pulse: 76 83  Resp: 19 19  Temp:      Last Pain:  Vitals:   04/17/16 1232  TempSrc: Oral  PainSc:                  [name]

## 2016-04-17 NOTE — Anesthesia Procedure Notes (Signed)
Procedure Name: MAC Date/Time: 04/17/2016 12:11 PM Performed by: Carney Living Pre-anesthesia Checklist: Patient identified, Emergency Drugs available, Suction available, Patient being monitored and Timeout performed Patient Re-evaluated:Patient Re-evaluated prior to inductionOxygen Delivery Method: Nasal cannula

## 2016-04-18 ENCOUNTER — Encounter (HOSPITAL_COMMUNITY): Payer: Self-pay | Admitting: Gastroenterology

## 2016-04-18 DIAGNOSIS — I481 Persistent atrial fibrillation: Secondary | ICD-10-CM

## 2016-04-18 LAB — CBC
HCT: 28 % — ABNORMAL LOW (ref 36.0–46.0)
HEMOGLOBIN: 8.4 g/dL — AB (ref 12.0–15.0)
MCH: 23.5 pg — AB (ref 26.0–34.0)
MCHC: 30 g/dL (ref 30.0–36.0)
MCV: 78.4 fL (ref 78.0–100.0)
Platelets: 205 10*3/uL (ref 150–400)
RBC: 3.57 MIL/uL — AB (ref 3.87–5.11)
RDW: 20.1 % — ABNORMAL HIGH (ref 11.5–15.5)
WBC: 7.8 10*3/uL (ref 4.0–10.5)

## 2016-04-18 LAB — GLUCOSE, CAPILLARY
Glucose-Capillary: 113 mg/dL — ABNORMAL HIGH (ref 65–99)
Glucose-Capillary: 119 mg/dL — ABNORMAL HIGH (ref 65–99)
Glucose-Capillary: 99 mg/dL (ref 65–99)

## 2016-04-18 LAB — TYPE AND SCREEN
BLOOD PRODUCT EXPIRATION DATE: 201802132359
Blood Product Expiration Date: 201802022359
ISSUE DATE / TIME: 201801300540
ISSUE DATE / TIME: 201801310947
UNIT TYPE AND RH: 9500
Unit Type and Rh: 9500

## 2016-04-18 MED ORDER — BISACODYL 5 MG PO TBEC: 10.0000 mg | DELAYED_RELEASE_TABLET | Freq: Every day | ORAL | Status: DC | PRN

## 2016-04-18 NOTE — Care Management Note (Signed)
Case Management Note  Patient Details  Name: Krystal Clarke MRN: [id number] Date of Birth: [birth year]  Subjective/Objective:                    Action/Plan:  Resumption of care  Expected Discharge Date:  04/18/16               Expected Discharge Plan:  [place]  In-House Referral:     Discharge planning Services  CM Consult  Post Acute Care Choice:    Choice offered to:  Patient  DME Arranged:    DME Agency:     HH Arranged:  RN, PT [place] Agency:  [place]  Status of Service:  Completed, signed off  If discussed at [place] of Stay Meetings, dates discussed:    Additional Comments:  Marilu Favre, RN 04/18/2016, 3:40 PM

## 2016-04-18 NOTE — Progress Notes (Signed)
Removed leads and monitor of capsule endoscopy as per instruction from day shift RN.  Patient resting on bed comfortably watching TV.

## 2016-04-18 NOTE — Discharge Summary (Signed)
PATIENT DETAILS Name: Krystal Clarke Age: 81 y.o. Sex: female Date of Birth: February 03, 1936 MRN: [id number]. Admitting Physician: Edwin Dada, MD [id number] S, MD  Admit Date: 04/15/2016 Discharge date: 04/18/2016  Recommendations for Outpatient Follow-up:  1. Follow up with PCP in 1-2 weeks 2. Please obtain BMP/CBC in one week 3. Please ensure follow up with Dr Cristina Gong [facility]) 4. Please follow up on the following pending results:  Admitted From:  Home  Disposition: [place]: No  Equipment/Devices: None  Discharge Condition: Stable  CODE STATUS: FULL CODE  Diet recommendation:  Heart Healthy / Carb Modified  Brief Summary: See H&P, Labs, Consult and Test reports for all details in brief, Patient is a 81 y.o. female with history of Sjogren's syndrome, A. fib on anticoagulation, prior history of CVA presented to the hospital with generalized weakness, and was found to have hemoglobin of 7, she was thought to have a occult GI bleeding and admitted for further evaluation and treatment. See below for further details  Brief Hospital Course: Probable upper GI bleed with acute blood loss anemia: GI consulted, underwent EGD on 1/31 without any major bleeding foci. She had a colonoscopy last year that was unremarkable. GI subsequently performed a Capsule Endoscopy. Spoke with Dr Marisa Hua MD-per Dr Jari Favre EGD is negative. Per GI recommendation post EGD-patient was resumed on her Eliquis on 1/31-it was briefly held since admission. Furthermore-no over GI bleeding noted-patient has not had a BM since hospitalized. Ok to [employer] further recommendations from [place] from outpatient follow up with GI. Note-patient was transfused 2 units of PRBC during this hospital stay. Hb stable at 8.4  Atrial fibrillation: Rate controlled with Cardizem-we will resume Eliquis-per GI recommendations.CHADS2Vasc 7.   Hypertension: BP controlled-continue  Cardizem  Type 2 diabetes: CBGs stable with SSI, resume metformin on discharge  Migraine headaches: Continue Lyrica. Currently stable- need to avoid NSAIDs as on anticoagulation and suspicion for upper GI bleeding  History of Sjogren syndrome  History of Colovesical fistula s/p robotic colectomy &bladder closure 02/07/2016  Procedures/Studies: EGD 1/31>>NO Bleeding   Discharge Diagnoses:  Principal Problem:   GI bleed Active Problems:   Migraine headache   Essential hypertension   Sjogren's syndrome (HCC)   Atrial fibrillation ([place])   Diabetes mellitus without complication ([place])   Symptomatic anemia   Discharge Instructions:  Activity:  As tolerated with Full fall precautions use walker/cane & assistance as needed   Discharge Instructions    Call MD for:    Complete by:  As directed    Black or bloody stools   Call MD for:  difficulty breathing, headache or visual disturbances    Complete by:  As directed    Call MD for:  persistant nausea and vomiting    Complete by:  As directed    Diet - low sodium heart healthy    Complete by:  As directed    Increase activity slowly    Complete by:  As directed      Allergies as of 04/18/2016      Reactions   Oxycodone-aspirin Nausea And Vomiting   Codeine Nausea And Vomiting   Hydrocodone Nausea And Vomiting   Morphine And Related Nausea And Vomiting   Oxycodone Nausea And Vomiting   Pilocarpine Other (See Comments)   Reaction:  Unknown    Rivaroxaban Other (See Comments)   Reaction:  Back pain/headaches       Medication List    TAKE these medications  apixaban 5 MG Tabs tablet Commonly known as:  ELIQUIS Take 1 tablet (5 mg total) by mouth 2 (two) times daily.   benzonatate 100 MG capsule Commonly known as:  TESSALON Take 100 mg by mouth 3 (three) times daily.   diltiazem 180 MG 24 hr capsule Commonly known as:  CARDIZEM CD TAKE 1 CAPSULE (180 MG TOTAL) BY MOUTH DAILY.   dorzolamide 2 % ophthalmic  solution Commonly known as:  TRUSOPT Place 1 drop into both eyes 2 (two) times daily.   fenofibrate 145 MG tablet Commonly known as:  TRICOR Take 145 mg by mouth at bedtime.   metFORMIN 500 MG 24 hr tablet Commonly known as:  GLUCOPHAGE-XR Take 500 mg by mouth every evening.   omeprazole-sodium bicarbonate 40-1100 MG capsule Commonly known as:  ZEGERID Take 1 capsule by mouth daily before breakfast.   ondansetron 4 MG tablet Commonly known as:  ZOFRAN Take 4 mg by mouth every 8 (eight) hours as needed for nausea or vomiting.   pregabalin 150 MG capsule Commonly known as:  LYRICA Take 150-300 mg by mouth 2 (two) times daily. Pt takes one capsule every morning and two at bedtime.   promethazine 25 MG tablet Commonly known as:  PHENERGAN Take 12.5 mg by mouth every 6 (six) hours as needed for nausea or vomiting.   timolol 0.5 % ophthalmic solution Commonly known as:  BETIMOL Place 1 drop into both eyes 2 (two) times daily.   traMADol 50 MG tablet Commonly known as:  ULTRAM Take 1-2 tablets (50-100 mg total) by mouth every 6 (six) hours as needed for moderate pain or severe pain.   traZODone 50 MG tablet Commonly known as:  DESYREL Take 25-50 mg by mouth at bedtime as needed for sleep.      Follow-up Information    WHITE,[name] S, MD. Schedule an appointment as soon as possible for a visit in 1 week(s).   Specialty:  Family Medicine Contact information: [place]  [address] [phone]        Cleotis Nipper, MD. Schedule an appointment as soon as possible for a visit in 2 week(s).   Specialty:  Gastroenterology Contact information: [id number] [place] [address] [phone]          Allergies  Allergen Reactions  . Oxycodone-Aspirin Nausea And Vomiting  . Codeine Nausea And Vomiting  . Hydrocodone Nausea And Vomiting  . Morphine And Related Nausea And Vomiting  . Oxycodone Nausea And Vomiting  .  Pilocarpine Other (See Comments)    Reaction:  Unknown   . Rivaroxaban Other (See Comments)    Reaction:  Back pain/headaches     Consultations:   neurology  Other Procedures/Studies: Ct Abdomen Pelvis W Contrast  Result Date: 04/16/2016 CLINICAL DATA:  Generalized weakness and fatigue. EXAM: CT ABDOMEN AND PELVIS WITH CONTRAST TECHNIQUE: Multidetector CT imaging of the abdomen and pelvis was performed using the standard protocol following bolus administration of intravenous contrast. CONTRAST:  13mL ISOVUE-300 IOPAMIDOL (ISOVUE-300) INJECTION 61% COMPARISON:  01/01/2016 FINDINGS: Lower chest: No acute abnormality. Hepatobiliary: No focal liver abnormality is seen. No gallstones, gallbladder wall thickening, or biliary dilatation. Pancreas: Unremarkable. No pancreatic ductal dilatation or surrounding inflammatory changes. Spleen: Normal in size without focal abnormality. Adrenals/Urinary Tract: Adrenal glands are unremarkable. Kidneys are normal, without renal calculi, focal lesion, or hydronephrosis. Bladder is unremarkable. Stomach/Bowel: Stomach, small bowel and appendix are normal. Moderate colonic diverticulosis. Partial sigmoidectomy with end-to-side anastomosis in the  low pelvic midline, appearing unremarkable. Previously observed colovesical and colovaginal fistulas are no longer evident. Vascular/Lymphatic: The abdominal aorta is normal in caliber with moderate atherosclerotic calcification. No pathologic adenopathy is evident in the abdomen or pelvis. Reproductive: Status post hysterectomy. No adnexal masses. Other: Subcutaneous stranding in the right lower quadrant may represent prior ostomy site or prior surgical incision. No drainable collection. Small fat containing umbilical hernia. No ascites. No acute inflammatory changes within the abdomen or pelvis. Musculoskeletal: No significant skeletal lesion. Moderately severe degenerative lumbar disc and facet disease, with grade 1 degenerative  spondylolisthesis at L4-5, unchanged. IMPRESSION: 1. Since the study of 01/01/2016, the patient has undergone partial sigmoidectomy, and the fistulas are no longer evident. Unremarkable appearances of the sigmoid anastomosis. 2. Mild stranding opacities in the subcutaneous right lower quadrant, likely related to the prior ostomy or prior surgery. No abscess or drainable collection. 3. No acute inflammatory changes are evident within the abdomen or pelvis. 4. Colonic diverticulosis. 5. Small fat containing umbilical hernia. 6. Moderately severe lumbar degenerative disc and facet disease. Electronically Signed   By: Andreas Newport M.D.   On: 04/16/2016 01:45     TODAY-DAY OF DISCHARGE:  Subjective:   Pennylane Mccory today has no headache,no chest abdominal pain,no new weakness tingling or numbness, feels much better wants to go home today. No BM since admission  Objective:   Blood pressure 105/60, pulse 81, temperature 98.6 F (37 C), resp. rate 18, height 5\' 1"  (1.549 m), weight 76.5 kg (168 lb 10.4 oz), SpO2 96 %.  Intake/Output Summary (Last 24 hours) at 04/18/16 1459 Last data filed at 04/18/16 1130  Gross per 24 hour  Intake             1200 ml  Output                0 ml  Net             1200 ml   Filed Weights   04/17/16 1137 04/17/16 1247 04/18/16 1025  Weight: 76.2 kg (168 lb) 76.2 kg (168 lb) 76.5 kg (168 lb 10.4 oz)    Exam: Awake Alert, Oriented *3, No new F.N deficits, Normal affect [place].AT,PERRAL Supple Neck,No JVD, No cervical lymphadenopathy appriciated.  Symmetrical Chest wall movement, Good air movement bilaterally, CTAB RRR,No Gallops,Rubs or new Murmurs, No Parasternal Heave +ve B.Sounds, Abd Soft, Non tender, No organomegaly appriciated, No rebound -guarding or rigidity. No Cyanosis, Clubbing or edema, No new Rash or bruise   PERTINENT RADIOLOGIC STUDIES: Ct Abdomen Pelvis W Contrast  Result Date: 04/16/2016 CLINICAL DATA:  Generalized weakness and fatigue.  EXAM: CT ABDOMEN AND PELVIS WITH CONTRAST TECHNIQUE: Multidetector CT imaging of the abdomen and pelvis was performed using the standard protocol following bolus administration of intravenous contrast. CONTRAST:  136mL ISOVUE-300 IOPAMIDOL (ISOVUE-300) INJECTION 61% COMPARISON:  01/01/2016 FINDINGS: Lower chest: No acute abnormality. Hepatobiliary: No focal liver abnormality is seen. No gallstones, gallbladder wall thickening, or biliary dilatation. Pancreas: Unremarkable. No pancreatic ductal dilatation or surrounding inflammatory changes. Spleen: Normal in size without focal abnormality. Adrenals/Urinary Tract: Adrenal glands are unremarkable. Kidneys are normal, without renal calculi, focal lesion, or hydronephrosis. Bladder is unremarkable. Stomach/Bowel: Stomach, small bowel and appendix are normal. Moderate colonic diverticulosis. Partial sigmoidectomy with end-to-side anastomosis in the low pelvic midline, appearing unremarkable. Previously observed colovesical and colovaginal fistulas are no longer evident. Vascular/Lymphatic: The abdominal aorta is normal in caliber with moderate atherosclerotic calcification. No pathologic adenopathy is evident in the abdomen or  pelvis. Reproductive: Status post hysterectomy. No adnexal masses. Other: Subcutaneous stranding in the right lower quadrant may represent prior ostomy site or prior surgical incision. No drainable collection. Small fat containing umbilical hernia. No ascites. No acute inflammatory changes within the abdomen or pelvis. Musculoskeletal: No significant skeletal lesion. Moderately severe degenerative lumbar disc and facet disease, with grade 1 degenerative spondylolisthesis at L4-5, unchanged. IMPRESSION: 1. Since the study of 01/01/2016, the patient has undergone partial sigmoidectomy, and the fistulas are no longer evident. Unremarkable appearances of the sigmoid anastomosis. 2. Mild stranding opacities in the subcutaneous right lower quadrant,  likely related to the prior ostomy or prior surgery. No abscess or drainable collection. 3. No acute inflammatory changes are evident within the abdomen or pelvis. 4. Colonic diverticulosis. 5. Small fat containing umbilical hernia. 6. Moderately severe lumbar degenerative disc and facet disease. Electronically Signed   By: Andreas Newport M.D.   On: 04/16/2016 01:45     PERTINENT LAB RESULTS: CBC:  Recent Labs  04/17/16 1419 04/18/16 0443  WBC 8.5 7.8  HGB 9.0* 8.4*  HCT 29.9* 28.0*  PLT 215 205   CMET CMP     Component Value Date/Time   NA 137 04/17/2016 0512   NA 135 (A) 02/19/2016   K 3.9 04/17/2016 0512   CL 106 04/17/2016 0512   CO2 26 04/17/2016 0512   GLUCOSE 101 (H) 04/17/2016 0512   GLUCOSE 123 (H) 01/28/2006 1030   BUN 10 04/17/2016 0512   BUN 7 02/19/2016   CREATININE 0.70 04/17/2016 0512   CREATININE 1.05 (H) 04/05/2015 0954   CALCIUM 9.3 04/17/2016 0512   PROT 5.8 (L) 04/15/2016 1941   ALBUMIN 3.3 (L) 04/15/2016 1941   AST 21 04/15/2016 1941   ALT 10 (L) 04/15/2016 1941   ALKPHOS 41 04/15/2016 1941   BILITOT 0.3 04/15/2016 1941   GFRNONAA >60 04/17/2016 0512   GFRNONAA 72 04/21/2014 1501   GFRAA >60 04/17/2016 0512   GFRAA 83 04/21/2014 1501    GFR Estimated Creatinine Clearance: 54.3 mL/min (by C-G formula based on SCr of 0.7 mg/dL). No results for input(s): LIPASE, AMYLASE in the last 72 hours. No results for input(s): CKTOTAL, CKMB, CKMBINDEX, TROPONINI in the last 72 hours. Invalid input(s): POCBNP No results for input(s): DDIMER in the last 72 hours. No results for input(s): HGBA1C in the last 72 hours. No results for input(s): CHOL, HDL, LDLCALC, TRIG, CHOLHDL, LDLDIRECT in the last 72 hours. No results for input(s): TSH, T4TOTAL, T3FREE, THYROIDAB in the last 72 hours.  Invalid input(s): FREET3 No results for input(s): VITAMINB12, FOLATE, FERRITIN, TIBC, IRON, RETICCTPCT in the last 72 hours. Coags: No results for input(s): INR in the  last 72 hours.  Invalid input(s): PT Microbiology: No results found for this or any previous visit (from the past 240 hour(s)).  FURTHER DISCHARGE INSTRUCTIONS:  Get Medicines reviewed and adjusted: Please take all your medications with you for your next visit with your Primary MD  Laboratory/radiological data: Please request your Primary MD to go over all hospital tests and procedure/radiological results at the follow up, please ask your Primary MD to get all Hospital records sent to his/her office.  In some cases, they will be blood work, cultures and biopsy results pending at the time of your discharge. Please request that your primary care M.D. goes through all the records of your hospital data and follows up on these results.  Also Note the following: If you experience worsening of your admission symptoms, develop shortness  of breath, life threatening emergency, suicidal or homicidal thoughts you must seek medical attention immediately by calling 911 or calling your MD immediately  if symptoms less severe.  You must read complete instructions/literature along with all the possible adverse reactions/side effects for all the Medicines you take and that have been prescribed to you. Take any new Medicines after you have completely understood and accpet all the possible adverse reactions/side effects.   Do not drive when taking Pain medications or sleeping medications (Benzodaizepines)  Do not take more than prescribed Pain, Sleep and Anxiety Medications. It is not advisable to combine anxiety,sleep and pain medications without talking with your primary care practitioner  Special Instructions: If you have smoked or chewed Tobacco  in the last 2 yrs please stop smoking, stop any regular Alcohol  and or any Recreational drug use.  Wear Seat belts while driving.  Please note: You were cared for by a hospitalist during your hospital stay. Once you are discharged, your primary care physician  will handle any further medical issues. Please note that NO REFILLS for any discharge medications will be authorized once you are discharged, as it is imperative that you return to your primary care physician (or establish a relationship with a primary care physician if you do not have one) for your post hospital discharge needs so that they can reassess your need for medications and monitor your lab values.  Total Time spent coordinating discharge including counseling, education and face to face time equals  45 minutes.  SignedOren Binet 04/18/2016 2:59 PM

## 2016-04-18 NOTE — Discharge Instructions (Signed)
Gastrointestinal Bleeding °Gastrointestinal (GI) bleeding is bleeding somewhere along the digestive tract, between the mouth and anus. This can be caused by various problems. The severity of these problems can range from mild to serious or even life-threatening. If you have GI bleeding, you may find blood in your stools (feces), you may have black stools, or you may vomit blood. If there is a lot of bleeding, you may need to stay in the hospital. °What are the causes? °This condition may be caused by: °· Esophagitis. This is inflammation, irritation, or swelling of the esophagus. °· Hemorrhoids. These are swollen veins in the rectum. °· Anal fissures. These are areas of painful tearing that are often caused by passing hard stool. °· Diverticulosis. These are pouches that form on the colon over time, with age, and may bleed a lot. °· Diverticulitis. This is inflammation in areas with diverticulosis. It can cause pain, fever, and bloody stools, although bleeding may be mild. °· Polyps and cancer. Colon cancer often starts out as precancerous polyps. °· Gastritis and ulcers. With these, bleeding may come from the upper GI tract, near the stomach. °What are the signs or symptoms? °Symptoms of this condition may include: °· Bright red blood in your vomit, or vomit that looks like coffee grounds. °· Bloody, black, or tarry stools. °¨ Bleeding from the lower GI tract will usually cause red or maroon blood in the stools. °¨ Bleeding from the upper GI tract may cause black, tarry, often bad-smelling stools. °¨ In certain cases, if the bleeding is fast enough, the stools may be red. °· Pain or cramping in the abdomen. °How is this diagnosed? °This condition may be diagnosed based on: °· Medical history and physical exam. °· Various tests, such as: °¨ Blood tests. °¨ X-rays and other imaging tests. °¨ Esophagogastroduodenoscopy (EGD). In this test, a flexible, lighted tube is used to look at your esophagus, stomach, and small  intestine. °¨ Colonoscopy. In this test, a flexible, lighted tube is used to look at your colon. °How is this treated? °Treatment for this condition depends on the cause of the bleeding. For example: °· For bleeding from the esophagus, stomach, small intestine, or colon, the health care provider doing your EGD or colonoscopy may be able to stop the bleeding as part of the procedure. °· Inflammation or infection of the colon can be treated with medicines. °· Certain rectal problems can be treated with creams, suppositories, or warm baths. °· Surgery is sometimes needed. °· Blood transfusions are sometimes needed if a lot of blood has been lost. °If bleeding is slow, you may be allowed to go home. If there is a lot of bleeding, you will need to stay in the hospital for observation. °Follow these instructions at home: °· Take over-the-counter and prescription medicines only as told by your health care provider. °· Eat foods that are high in fiber. This will help to keep your stools soft. These foods include whole grains, legumes, fruits, and vegetables. Eating 1-3 prunes each day works well for many people. °· Drink enough fluid to keep your urine clear or pale yellow. °· Keep all follow-up visits as told by your health care provider. This is important. °Contact a health care provider if: °· Your symptoms do not improve. °Get help right away if: °· Your bleeding increases. °· You feel light-headed or you faint. °· You feel weak. °· You have severe cramps in your back or abdomen. °· You pass large blood clots in your stool. °· Your symptoms are   getting worse. °This information is not intended to replace advice given to you by your health care provider. Make sure you discuss any questions you have with your health care provider. °Document Released: 03/01/2000 Document Revised: 08/02/2015 Document Reviewed: 08/22/2014 °Elsevier Interactive Patient Education © 2017 [employer] ° °

## 2016-04-19 DIAGNOSIS — I4891 Unspecified atrial fibrillation: Secondary | ICD-10-CM | POA: Diagnosis not present

## 2016-04-19 DIAGNOSIS — I10 Essential (primary) hypertension: Secondary | ICD-10-CM | POA: Diagnosis not present

## 2016-04-19 DIAGNOSIS — D638 Anemia in other chronic diseases classified elsewhere: Secondary | ICD-10-CM | POA: Diagnosis not present

## 2016-04-19 DIAGNOSIS — G40909 Epilepsy, unspecified, not intractable, without status epilepticus: Secondary | ICD-10-CM | POA: Diagnosis not present

## 2016-04-19 DIAGNOSIS — Z48815 Encounter for surgical aftercare following surgery on the digestive system: Secondary | ICD-10-CM | POA: Diagnosis not present

## 2016-04-19 DIAGNOSIS — E1142 Type 2 diabetes mellitus with diabetic polyneuropathy: Secondary | ICD-10-CM | POA: Diagnosis not present

## 2016-04-20 ENCOUNTER — Encounter (HOSPITAL_COMMUNITY): Payer: Self-pay

## 2016-04-20 ENCOUNTER — Emergency Department (HOSPITAL_COMMUNITY)
Admission: EM | Admit: 2016-04-20 | Discharge: 2016-04-20 | Disposition: A | Discharge status: Home / Self Care | Payer: Medicare Other | Attending: Emergency Medicine | Admitting: Emergency Medicine

## 2016-04-20 DIAGNOSIS — R0902 Hypoxemia: Secondary | ICD-10-CM | POA: Diagnosis not present

## 2016-04-20 DIAGNOSIS — I10 Essential (primary) hypertension: Secondary | ICD-10-CM | POA: Insufficient documentation

## 2016-04-20 DIAGNOSIS — Z8673 Personal history of transient ischemic attack (TIA), and cerebral infarction without residual deficits: Secondary | ICD-10-CM | POA: Diagnosis not present

## 2016-04-20 DIAGNOSIS — Z87891 Personal history of nicotine dependence: Secondary | ICD-10-CM | POA: Diagnosis not present

## 2016-04-20 DIAGNOSIS — K625 Hemorrhage of anus and rectum: Secondary | ICD-10-CM

## 2016-04-20 DIAGNOSIS — E1140 Type 2 diabetes mellitus with diabetic neuropathy, unspecified: Secondary | ICD-10-CM | POA: Diagnosis not present

## 2016-04-20 LAB — COMPREHENSIVE METABOLIC PANEL
ALK PHOS: 57 U/L (ref 38–126)
ALT: 15 U/L (ref 14–54)
AST: 24 U/L (ref 15–41)
Albumin: 3.6 g/dL (ref 3.5–5.0)
Anion gap: 7 (ref 5–15)
BILIRUBIN TOTAL: 0.7 mg/dL (ref 0.3–1.2)
BUN: 12 mg/dL (ref 6–20)
CALCIUM: 9.9 mg/dL (ref 8.9–10.3)
CO2: 26 mmol/L (ref 22–32)
CREATININE: 0.75 mg/dL (ref 0.44–1.00)
Chloride: 105 mmol/L (ref 101–111)
GFR calc Af Amer: >60 mL/min (ref >60)
GFR calc non Af Amer: >60 mL/min (ref >60)
GLUCOSE: 108 mg/dL — AB (ref 65–99)
Potassium: 3.7 mmol/L (ref 3.5–5.1)
SODIUM: 138 mmol/L (ref 135–145)
TOTAL PROTEIN: 6.4 g/dL — AB (ref 6.5–8.1)

## 2016-04-20 LAB — CBC WITH DIFFERENTIAL/PLATELET
BASOS ABS: 0 10*3/uL (ref 0.0–0.1)
Basophils Relative: 0 %
Eosinophils Absolute: 0.2 10*3/uL (ref 0.0–0.7)
Eosinophils Relative: 2 %
HEMATOCRIT: 28.1 % — AB (ref 36.0–46.0)
Hemoglobin: 8.5 g/dL — ABNORMAL LOW (ref 12.0–15.0)
LYMPHS ABS: 2 10*3/uL (ref 0.7–4.0)
Lymphocytes Relative: 26 %
MCH: 23.5 pg — ABNORMAL LOW (ref 26.0–34.0)
MCHC: 30.2 g/dL (ref 30.0–36.0)
MCV: 77.6 fL — ABNORMAL LOW (ref 78.0–100.0)
MONOS PCT: 9 %
Monocytes Absolute: 0.7 10*3/uL (ref 0.1–1.0)
NEUTROS ABS: 4.8 10*3/uL (ref 1.7–7.7)
Neutrophils Relative %: 63 %
Platelets: 218 10*3/uL (ref 150–400)
RBC: 3.62 MIL/uL — ABNORMAL LOW (ref 3.87–5.11)
RDW: 21.7 % — AB (ref 11.5–15.5)
WBC: 7.7 10*3/uL (ref 4.0–10.5)

## 2016-04-20 LAB — POC OCCULT BLOOD, ED: Fecal Occult Bld: NEGATIVE

## 2016-04-20 LAB — PROTIME-INR
INR: 1.22
Prothrombin Time: 15.5 seconds — ABNORMAL HIGH (ref 11.4–15.2)

## 2016-04-20 LAB — TYPE AND SCREEN
ABO/RH(D): O NEG
ANTIBODY SCREEN: NEGATIVE

## 2016-04-20 NOTE — ED Provider Notes (Signed)
Emergency Department Provider Note   I have reviewed the triage vital signs and the nursing notes.   HISTORY  Chief Complaint Rectal Bleeding and Anemia   HPI Krystal Clarke is a 81 y.o. female with PMH of a-fib on Eliquis, HTN, Migraine HA, GERD, and CVA presents to the emergency room in for evaluation of large volume black tarry stools that started last night. The patient was recently admitted for GI bleeding that resolved. She had a negative upper endoscopy. Patient also had pill endoscopy with plan to follow with her gastroenterologist later this week. In November she had closure of a colon fistula. Patient states that last night she had some return of constipation and took laxatives with large volume, black bowel movements. She had multiple throughout the evening began to feel weak this morning. Patient states she received blood transfusion during her last hospitalization and was told to immediately return to the emergency department if bleeding returned. She has been taking her Eliquis since discharge. She denies any chest pain or difficulty breathing. No fevers. No abdominal pain.    Past Medical History:  Diagnosis Date  . Acute respiratory failure with hypoxia ([place]) 12/06/2012  . Allergy   . Blindness of right eye   . Complication of anesthesia   . Dehydration with hyponatremia 12/07/2012  . Diabetes mellitus without complication ([place])    diagnosed 2 weeks ago- No CBG meter as of yet.  Marland Kitchen DIVERTICULITIS, HX OF 01/27/2009   Qualifier: Diagnosis of  By: Jerold Coombe    . Gait disorder 06/05/2012  . GERD (gastroesophageal reflux disease)   . Glaucoma   . Glucose intolerance (impaired glucose tolerance)   . History of diverticulitis   . Hypertension   . Legally blind   . Migraine triggered seizures ([place])    history of  . Migraines   . Neuropathy ([place])   . OP (osteoporosis)   . Osteoarthritis    osteoarthritis -right knee. uses walker. Left shoulder -"Limited ROM"    . Paroxysmal atrial fibrillation ([place]) 12/06/2012   Echo (12/07/12): Moderate focal basal hypertrophy of the septum, vigorous LV function, EF 65-70%, indeterminate diastolic function, normal wall motion, trivial MR, moderate LAE, trivial TR;  Xarelto started 11/2012  . Peripheral neuropathy ([facility])    neuropathy  . Pneumonia   . PONV (postoperative nausea and vomiting)    no problems other past few surgeries  . SHINGLES, HX OF 07/17/2009   Qualifier: Diagnosis of  By: Jerold Coombe    . Siriasis   . Sjogren's syndrome ([place])    bilateral vision  . Sleep apnea    does not wear CPAP  . Stroke [facility])    residual - rare Intermittent expressive aphasia,    Patient Active Problem List   Diagnosis Date Noted  . GI bleed 04/16/2016  . Symptomatic anemia 04/16/2016  . Hypoxia - on oxygen 02/10/2016  . Diabetes mellitus without complication ([place])   . Obesity (BMI 30-39.9) 02/08/2016  . Lichen sclerosus 0000000  . 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 02/07/2016 02/07/2016  . Chest pain 11/04/2014  . Elevated white blood cell count 11/04/2014  . B12 deficiency 05/20/2013  . Seizure disorder ([place]) 12/07/2012  . Atrial fibrillation ([place]) 12/06/2012  . Sjogren's syndrome ([place])   . Hereditary and idiopathic peripheral neuropathy 05/09/2012  . 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 ([place]) 05/09/2012  . History of detached retina repair 02/24/2012  . Primary open angle glaucoma of both  eyes, severe stage 02/24/2012  . ANEMIA, B12 DEFICIENCY 07/19/2009  . Anxiety state 01/27/2009  . Sleep apnea 12/19/2008  . MIXED HYPERLIPIDEMIA 07/18/2008  . HYPERTRIGLYCERIDEMIA 08/28/2006  . Migraine headache 08/28/2006  . Essential hypertension 08/28/2006  . GERD 08/28/2006  . PSORIASIS 08/28/2006  . Osteoarthritis 08/28/2006  . OSTEOPOROSIS 08/28/2006    Past Surgical History:  Procedure Laterality Date  . BONE TUMOR EXCISION Right    arm  .  CATARACT EXTRACTION    . COLONOSCOPY WITH PROPOFOL N/A 06/09/2015   Procedure: COLONOSCOPY WITH PROPOFOL;  Surgeon: Ronald Lobo, MD;  Location: [facility];  Service: Endoscopy;  Laterality: N/A;  . DILATION AND CURETTAGE OF UTERUS    . ESOPHAGOGASTRODUODENOSCOPY N/A 04/17/2016   Procedure: ESOPHAGOGASTRODUODENOSCOPY (EGD);  Surgeon: Teena Irani, MD;  Location[facility] ENDOSCOPY;  Service: Endoscopy;  Laterality: N/A;  . ESOPHAGOGASTRODUODENOSCOPY (EGD) WITH PROPOFOL N/A 06/09/2015   Procedure: ESOPHAGOGASTRODUODENOSCOPY (EGD) WITH PROPOFOL;  Surgeon: Ronald Lobo, MD;  Location: [facility] ENDOSCOPY;  Service: Endoscopy;  Laterality: N/A;  . EYE SURGERY    . FOOT FRACTURE SURGERY Left 2007  . GIVENS CAPSULE STUDY N/A 04/17/2016   Procedure: GIVENS CAPSULE STUDY;  Surgeon: Teena Irani, MD;  Location: [facility];  Service: Endoscopy;  Laterality: N/A;  . KNEE ARTHROSCOPY Right   . LUMBAR LAMINECTOMY    . ROBOT ASSISTED LAPAROSCOPIC PARTIAL COLECTOMY  02/07/2016   Procedure: ROBOT ASSISTED LAPAROSCOPIC PARTIAL COLECTOMY;  Surgeon: Leighton Ruff, MD;  Location: [facility] ORS;  Service: General;;  . SHOULDER OPEN ROTATOR CUFF REPAIR    . TONSILLECTOMY AND ADENOIDECTOMY    . TOTAL ABDOMINAL HYSTERECTOMY W/ BILATERAL SALPINGOOPHORECTOMY      Current Outpatient Rx  . Order #: [id number] Class: Normal  . Order #: [id number] Class: Normal  . Order #: [id number] Class: Historical Med  . Order #: [id number] Class: Historical Med  . Order #: [id number] Class: Historical Med  . Order #: [id number] Class: Historical Med  . Order #: [id number] Class: Historical Med  . Order #: [id number] Class: Historical Med  . Order #: [id number] Class: Historical Med  . Order #: [id number] Class: Normal  . Order #: [id number] Class: Historical Med    Allergies Oxycodone-aspirin; Codeine; Hydrocodone; Morphine and related; Oxycodone; Pilocarpine; and Rivaroxaban  Family History  Problem Relation Age of Onset  . Stroke Mother   . Migraines  Mother   . Stomach cancer Father   . Sjogren's syndrome Father   . Sjogren's syndrome Brother   . Sjogren's syndrome Brother   . Scleroderma Sister     raynaud's scleroderma  . Lymphoma Daughter   . Migraines Daughter   . Heart disease      maternal side  . Stroke      maternal side, 1st degree female <60    Social History Social History  Substance Use Topics  . Smoking status: Former Smoker    Types: Cigarettes  . Smokeless tobacco: Never Used     Comment: quit smoking before age of 89  . Alcohol use No    Review of Systems  Constitutional: No fever/chills Eyes: No visual changes. ENT: No sore throat. Cardiovascular: Denies chest pain. Respiratory: Denies shortness of breath. Gastrointestinal: No abdominal pain.  No nausea, no vomiting. Positive large volume black stool.  Genitourinary: Negative for dysuria. Musculoskeletal: Negative for back pain. Skin: Negative for rash. Neurological: Negative for headaches, focal weakness or numbness.  10-point ROS otherwise negative.  ____________________________________________   PHYSICAL EXAM:  VITAL SIGNS: ED Triage Vitals  Enc Vitals Group     BP 04/20/16 1336  121/77     Pulse Rate 04/20/16 1336 86     Resp 04/20/16 1336 16     Temp 04/20/16 1336 98.2 F (36.8 C)     Temp Source 04/20/16 1336 Oral     SpO2 04/20/16 1327 94 %     Weight 04/20/16 1337 168 lb (76.2 kg)     Height 04/20/16 1337 5\' 1"  (1.549 m)     Pain Score 04/20/16 1337 4   Constitutional: Alert and oriented. Well appearing and in no acute distress. Eyes: Conjunctivae are normal.  Head: Atraumatic. Nose: No congestion/rhinnorhea. Mouth/Throat: Mucous membranes are moist.  Oropharynx non-erythematous. Neck: No stridor.   Cardiovascular: Normal rate, regular rhythm. Good peripheral circulation. Grossly normal heart sounds.   Respiratory: Normal respiratory effort.  No retractions. Lungs CTAB. Gastrointestinal: Soft and nontender. No distention.  Normal rectal exam with brown stool. No melena. No BRB.  Musculoskeletal: No lower extremity tenderness nor edema. No gross deformities of extremities. Neurologic:  Normal speech and language. No gross focal neurologic deficits are appreciated.  Skin:  Skin is warm, dry and intact. No rash noted. Psychiatric: Mood and affect are normal. Speech and behavior are normal.  ____________________________________________   LABS (all labs ordered are listed, but only abnormal results are displayed)  Labs Reviewed  COMPREHENSIVE METABOLIC PANEL - Abnormal; Notable for the following:       Result Value   Glucose, Bld 108 (*)    Total Protein 6.4 (*)    All other components within normal limits  CBC WITH DIFFERENTIAL/PLATELET - Abnormal; Notable for the following:    RBC 3.62 (*)    Hemoglobin 8.5 (*)    HCT 28.1 (*)    MCV 77.6 (*)    MCH 23.5 (*)    RDW 21.7 (*)    All other components within normal limits  PROTIME-INR - Abnormal; Notable for the following:    Prothrombin Time 15.5 (*)    All other components within normal limits  POC OCCULT BLOOD, ED  TYPE AND SCREEN   ____________________________________________  RADIOLOGY  None ____________________________________________   PROCEDURES  Procedure(s) performed:   Procedures  None ____________________________________________   INITIAL IMPRESSION / ASSESSMENT AND PLAN / ED COURSE  Pertinent labs & imaging results that were available during my care of the patient were reviewed by me and considered in my medical decision making (see chart for details).  Patient resents to the emergency department for evaluation of large volume black stool overnight. She had some mild weakness this morning. Recently admitted for GI bleed of unknown source. Has plans to follow with gastroenterology later this week. Patient did require 2 units of blood transfusion during last admission. No melena or bright red blood on my exam and the emergency  department. Plan for repeat labs and discussion with gastroenterology PRN.   H/H at discharge baseline. Hemoccult negative. Patient with normal vital signs and has GI follow up later this week. Will discharge at this time.  At this time, I do not feel there is any life-threatening condition present. I have reviewed and discussed all results (EKG, imaging, lab, urine as appropriate), exam findings with patient. I have reviewed nursing notes and appropriate previous records.  I feel the patient is safe to be discharged home without further emergent workup. Discussed usual and customary return precautions. Patient and family (if present) verbalize understanding and are comfortable with this plan.  Patient will follow-up with their primary care provider. If they do not have a primary  care provider, information for follow-up has been provided to them. All questions have been answered.  ____________________________________________  FINAL CLINICAL IMPRESSION(S) / ED DIAGNOSES  Final diagnoses:  Rectal bleeding     MEDICATIONS GIVEN DURING THIS VISIT:  None  NEW OUTPATIENT MEDICATIONS STARTED DURING THIS VISIT:  None   Note:  This document was prepared using Dragon voice recognition software and may include unintentional dictation errors.  Nanda Quinton, MD Emergency Medicine   Margette Fast, MD 04/20/16 [phone]

## 2016-04-20 NOTE — ED Triage Notes (Signed)
Per [facility] pt from home reports was seen at PCP Tuesday for bloody stools, generalized weakness and had low hemoglobin. Pt reports episode of bloody stool last night (pt brought sample in bag).

## 2016-04-20 NOTE — Discharge Instructions (Signed)
You were seen in the ED today with concern for rectal bleeding. We found no blood in the stool and your hemoglobin was the same as when you left the hospital. We feel that you are safe to return home at this time. Follow up with your gastroenterologist in the coming week as scheduled.   Return with any new or worsening bleeding, lightheadedness, difficulty breathing, or other concerning symptoms.

## 2016-04-20 NOTE — ED Notes (Signed)
Bed: [id number] Expected date: 04/20/16 Expected time: 1:14 PM Means of arrival: Ambulance Comments: 81 yo Low Hgb bloody stools

## 2016-04-23 DIAGNOSIS — I959 Hypotension, unspecified: Secondary | ICD-10-CM | POA: Diagnosis not present

## 2016-04-23 DIAGNOSIS — K922 Gastrointestinal hemorrhage, unspecified: Secondary | ICD-10-CM | POA: Diagnosis not present

## 2016-04-23 DIAGNOSIS — D509 Iron deficiency anemia, unspecified: Secondary | ICD-10-CM | POA: Diagnosis not present

## 2016-04-24 DIAGNOSIS — D638 Anemia in other chronic diseases classified elsewhere: Secondary | ICD-10-CM | POA: Diagnosis not present

## 2016-04-24 DIAGNOSIS — I10 Essential (primary) hypertension: Secondary | ICD-10-CM | POA: Diagnosis not present

## 2016-04-24 DIAGNOSIS — G40909 Epilepsy, unspecified, not intractable, without status epilepticus: Secondary | ICD-10-CM | POA: Diagnosis not present

## 2016-04-24 DIAGNOSIS — E1142 Type 2 diabetes mellitus with diabetic polyneuropathy: Secondary | ICD-10-CM | POA: Diagnosis not present

## 2016-04-24 DIAGNOSIS — I4891 Unspecified atrial fibrillation: Secondary | ICD-10-CM | POA: Diagnosis not present

## 2016-04-24 DIAGNOSIS — Z48815 Encounter for surgical aftercare following surgery on the digestive system: Secondary | ICD-10-CM | POA: Diagnosis not present

## 2016-04-25 DIAGNOSIS — K922 Gastrointestinal hemorrhage, unspecified: Secondary | ICD-10-CM | POA: Diagnosis not present

## 2016-04-25 DIAGNOSIS — D62 Acute posthemorrhagic anemia: Secondary | ICD-10-CM | POA: Diagnosis not present

## 2016-04-25 DIAGNOSIS — E1142 Type 2 diabetes mellitus with diabetic polyneuropathy: Secondary | ICD-10-CM | POA: Diagnosis not present

## 2016-04-25 DIAGNOSIS — E785 Hyperlipidemia, unspecified: Secondary | ICD-10-CM | POA: Diagnosis not present

## 2016-04-25 DIAGNOSIS — K219 Gastro-esophageal reflux disease without esophagitis: Secondary | ICD-10-CM | POA: Diagnosis not present

## 2016-04-25 DIAGNOSIS — G40909 Epilepsy, unspecified, not intractable, without status epilepticus: Secondary | ICD-10-CM | POA: Diagnosis not present

## 2016-04-25 DIAGNOSIS — D638 Anemia in other chronic diseases classified elsewhere: Secondary | ICD-10-CM | POA: Diagnosis not present

## 2016-04-25 DIAGNOSIS — Z7984 Long term (current) use of oral hypoglycemic drugs: Secondary | ICD-10-CM | POA: Diagnosis not present

## 2016-04-25 DIAGNOSIS — I10 Essential (primary) hypertension: Secondary | ICD-10-CM | POA: Diagnosis not present

## 2016-04-25 DIAGNOSIS — F419 Anxiety disorder, unspecified: Secondary | ICD-10-CM | POA: Diagnosis not present

## 2016-04-25 DIAGNOSIS — I4891 Unspecified atrial fibrillation: Secondary | ICD-10-CM | POA: Diagnosis not present

## 2016-04-25 DIAGNOSIS — E669 Obesity, unspecified: Secondary | ICD-10-CM | POA: Diagnosis not present

## 2016-04-29 DIAGNOSIS — D638 Anemia in other chronic diseases classified elsewhere: Secondary | ICD-10-CM | POA: Diagnosis not present

## 2016-04-29 DIAGNOSIS — D62 Acute posthemorrhagic anemia: Secondary | ICD-10-CM | POA: Diagnosis not present

## 2016-04-29 DIAGNOSIS — E1142 Type 2 diabetes mellitus with diabetic polyneuropathy: Secondary | ICD-10-CM | POA: Diagnosis not present

## 2016-04-29 DIAGNOSIS — I4891 Unspecified atrial fibrillation: Secondary | ICD-10-CM | POA: Diagnosis not present

## 2016-04-29 DIAGNOSIS — G40909 Epilepsy, unspecified, not intractable, without status epilepticus: Secondary | ICD-10-CM | POA: Diagnosis not present

## 2016-04-29 DIAGNOSIS — K922 Gastrointestinal hemorrhage, unspecified: Secondary | ICD-10-CM | POA: Diagnosis not present

## 2016-05-03 DIAGNOSIS — G40909 Epilepsy, unspecified, not intractable, without status epilepticus: Secondary | ICD-10-CM | POA: Diagnosis not present

## 2016-05-03 DIAGNOSIS — I4891 Unspecified atrial fibrillation: Secondary | ICD-10-CM | POA: Diagnosis not present

## 2016-05-03 DIAGNOSIS — E1142 Type 2 diabetes mellitus with diabetic polyneuropathy: Secondary | ICD-10-CM | POA: Diagnosis not present

## 2016-05-03 DIAGNOSIS — D638 Anemia in other chronic diseases classified elsewhere: Secondary | ICD-10-CM | POA: Diagnosis not present

## 2016-05-03 DIAGNOSIS — K922 Gastrointestinal hemorrhage, unspecified: Secondary | ICD-10-CM | POA: Diagnosis not present

## 2016-05-03 DIAGNOSIS — D62 Acute posthemorrhagic anemia: Secondary | ICD-10-CM | POA: Diagnosis not present

## 2016-05-06 DIAGNOSIS — R195 Other fecal abnormalities: Secondary | ICD-10-CM | POA: Diagnosis not present

## 2016-05-06 DIAGNOSIS — D500 Iron deficiency anemia secondary to blood loss (chronic): Secondary | ICD-10-CM | POA: Diagnosis not present

## 2016-05-06 DIAGNOSIS — R143 Flatulence: Secondary | ICD-10-CM | POA: Diagnosis not present

## 2016-05-06 DIAGNOSIS — R110 Nausea: Secondary | ICD-10-CM | POA: Diagnosis not present

## 2016-05-07 DIAGNOSIS — D62 Acute posthemorrhagic anemia: Secondary | ICD-10-CM | POA: Diagnosis not present

## 2016-05-07 DIAGNOSIS — D638 Anemia in other chronic diseases classified elsewhere: Secondary | ICD-10-CM | POA: Diagnosis not present

## 2016-05-07 DIAGNOSIS — I4891 Unspecified atrial fibrillation: Secondary | ICD-10-CM | POA: Diagnosis not present

## 2016-05-07 DIAGNOSIS — K922 Gastrointestinal hemorrhage, unspecified: Secondary | ICD-10-CM | POA: Diagnosis not present

## 2016-05-07 DIAGNOSIS — G40909 Epilepsy, unspecified, not intractable, without status epilepticus: Secondary | ICD-10-CM | POA: Diagnosis not present

## 2016-05-07 DIAGNOSIS — E1142 Type 2 diabetes mellitus with diabetic polyneuropathy: Secondary | ICD-10-CM | POA: Diagnosis not present

## 2016-05-09 DIAGNOSIS — E1142 Type 2 diabetes mellitus with diabetic polyneuropathy: Secondary | ICD-10-CM | POA: Diagnosis not present

## 2016-05-09 DIAGNOSIS — K922 Gastrointestinal hemorrhage, unspecified: Secondary | ICD-10-CM | POA: Diagnosis not present

## 2016-05-09 DIAGNOSIS — D509 Iron deficiency anemia, unspecified: Secondary | ICD-10-CM | POA: Diagnosis not present

## 2016-05-09 DIAGNOSIS — D62 Acute posthemorrhagic anemia: Secondary | ICD-10-CM | POA: Diagnosis not present

## 2016-05-09 DIAGNOSIS — D638 Anemia in other chronic diseases classified elsewhere: Secondary | ICD-10-CM | POA: Diagnosis not present

## 2016-05-09 DIAGNOSIS — I4891 Unspecified atrial fibrillation: Secondary | ICD-10-CM | POA: Diagnosis not present

## 2016-05-09 DIAGNOSIS — G40909 Epilepsy, unspecified, not intractable, without status epilepticus: Secondary | ICD-10-CM | POA: Diagnosis not present

## 2016-05-10 DIAGNOSIS — K922 Gastrointestinal hemorrhage, unspecified: Secondary | ICD-10-CM | POA: Diagnosis not present

## 2016-05-10 DIAGNOSIS — E1142 Type 2 diabetes mellitus with diabetic polyneuropathy: Secondary | ICD-10-CM | POA: Diagnosis not present

## 2016-05-10 DIAGNOSIS — G40909 Epilepsy, unspecified, not intractable, without status epilepticus: Secondary | ICD-10-CM | POA: Diagnosis not present

## 2016-05-10 DIAGNOSIS — D638 Anemia in other chronic diseases classified elsewhere: Secondary | ICD-10-CM | POA: Diagnosis not present

## 2016-05-10 DIAGNOSIS — D62 Acute posthemorrhagic anemia: Secondary | ICD-10-CM | POA: Diagnosis not present

## 2016-05-10 DIAGNOSIS — I4891 Unspecified atrial fibrillation: Secondary | ICD-10-CM | POA: Diagnosis not present

## 2016-05-14 DIAGNOSIS — K922 Gastrointestinal hemorrhage, unspecified: Secondary | ICD-10-CM | POA: Diagnosis not present

## 2016-05-14 DIAGNOSIS — E1142 Type 2 diabetes mellitus with diabetic polyneuropathy: Secondary | ICD-10-CM | POA: Diagnosis not present

## 2016-05-14 DIAGNOSIS — G40909 Epilepsy, unspecified, not intractable, without status epilepticus: Secondary | ICD-10-CM | POA: Diagnosis not present

## 2016-05-14 DIAGNOSIS — I4891 Unspecified atrial fibrillation: Secondary | ICD-10-CM | POA: Diagnosis not present

## 2016-05-14 DIAGNOSIS — D62 Acute posthemorrhagic anemia: Secondary | ICD-10-CM | POA: Diagnosis not present

## 2016-05-14 DIAGNOSIS — D638 Anemia in other chronic diseases classified elsewhere: Secondary | ICD-10-CM | POA: Diagnosis not present

## 2016-05-15 DIAGNOSIS — K922 Gastrointestinal hemorrhage, unspecified: Secondary | ICD-10-CM | POA: Diagnosis not present

## 2016-05-15 DIAGNOSIS — G40909 Epilepsy, unspecified, not intractable, without status epilepticus: Secondary | ICD-10-CM | POA: Diagnosis not present

## 2016-05-15 DIAGNOSIS — D638 Anemia in other chronic diseases classified elsewhere: Secondary | ICD-10-CM | POA: Diagnosis not present

## 2016-05-15 DIAGNOSIS — D62 Acute posthemorrhagic anemia: Secondary | ICD-10-CM | POA: Diagnosis not present

## 2016-05-15 DIAGNOSIS — E1142 Type 2 diabetes mellitus with diabetic polyneuropathy: Secondary | ICD-10-CM | POA: Diagnosis not present

## 2016-05-15 DIAGNOSIS — I4891 Unspecified atrial fibrillation: Secondary | ICD-10-CM | POA: Diagnosis not present

## 2016-05-17 DIAGNOSIS — D638 Anemia in other chronic diseases classified elsewhere: Secondary | ICD-10-CM | POA: Diagnosis not present

## 2016-05-17 DIAGNOSIS — E1142 Type 2 diabetes mellitus with diabetic polyneuropathy: Secondary | ICD-10-CM | POA: Diagnosis not present

## 2016-05-17 DIAGNOSIS — D62 Acute posthemorrhagic anemia: Secondary | ICD-10-CM | POA: Diagnosis not present

## 2016-05-17 DIAGNOSIS — D500 Iron deficiency anemia secondary to blood loss (chronic): Secondary | ICD-10-CM | POA: Diagnosis not present

## 2016-05-17 DIAGNOSIS — G40909 Epilepsy, unspecified, not intractable, without status epilepticus: Secondary | ICD-10-CM | POA: Diagnosis not present

## 2016-05-17 DIAGNOSIS — I4891 Unspecified atrial fibrillation: Secondary | ICD-10-CM | POA: Diagnosis not present

## 2016-05-17 DIAGNOSIS — K922 Gastrointestinal hemorrhage, unspecified: Secondary | ICD-10-CM | POA: Diagnosis not present

## 2016-05-21 DIAGNOSIS — D638 Anemia in other chronic diseases classified elsewhere: Secondary | ICD-10-CM | POA: Diagnosis not present

## 2016-05-21 DIAGNOSIS — G40909 Epilepsy, unspecified, not intractable, without status epilepticus: Secondary | ICD-10-CM | POA: Diagnosis not present

## 2016-05-21 DIAGNOSIS — K922 Gastrointestinal hemorrhage, unspecified: Secondary | ICD-10-CM | POA: Diagnosis not present

## 2016-05-21 DIAGNOSIS — E1142 Type 2 diabetes mellitus with diabetic polyneuropathy: Secondary | ICD-10-CM | POA: Diagnosis not present

## 2016-05-21 DIAGNOSIS — D62 Acute posthemorrhagic anemia: Secondary | ICD-10-CM | POA: Diagnosis not present

## 2016-05-21 DIAGNOSIS — I4891 Unspecified atrial fibrillation: Secondary | ICD-10-CM | POA: Diagnosis not present

## 2016-05-22 DIAGNOSIS — I4891 Unspecified atrial fibrillation: Secondary | ICD-10-CM | POA: Diagnosis not present

## 2016-05-22 DIAGNOSIS — G40909 Epilepsy, unspecified, not intractable, without status epilepticus: Secondary | ICD-10-CM | POA: Diagnosis not present

## 2016-05-22 DIAGNOSIS — K922 Gastrointestinal hemorrhage, unspecified: Secondary | ICD-10-CM | POA: Diagnosis not present

## 2016-05-22 DIAGNOSIS — E1142 Type 2 diabetes mellitus with diabetic polyneuropathy: Secondary | ICD-10-CM | POA: Diagnosis not present

## 2016-05-22 DIAGNOSIS — D638 Anemia in other chronic diseases classified elsewhere: Secondary | ICD-10-CM | POA: Diagnosis not present

## 2016-05-22 DIAGNOSIS — D62 Acute posthemorrhagic anemia: Secondary | ICD-10-CM | POA: Diagnosis not present

## 2016-05-28 DIAGNOSIS — D638 Anemia in other chronic diseases classified elsewhere: Secondary | ICD-10-CM | POA: Diagnosis not present

## 2016-05-28 DIAGNOSIS — G40909 Epilepsy, unspecified, not intractable, without status epilepticus: Secondary | ICD-10-CM | POA: Diagnosis not present

## 2016-05-28 DIAGNOSIS — I4891 Unspecified atrial fibrillation: Secondary | ICD-10-CM | POA: Diagnosis not present

## 2016-05-28 DIAGNOSIS — E1142 Type 2 diabetes mellitus with diabetic polyneuropathy: Secondary | ICD-10-CM | POA: Diagnosis not present

## 2016-05-28 DIAGNOSIS — K922 Gastrointestinal hemorrhage, unspecified: Secondary | ICD-10-CM | POA: Diagnosis not present

## 2016-05-28 DIAGNOSIS — D62 Acute posthemorrhagic anemia: Secondary | ICD-10-CM | POA: Diagnosis not present

## 2016-05-29 DIAGNOSIS — K922 Gastrointestinal hemorrhage, unspecified: Secondary | ICD-10-CM | POA: Diagnosis not present

## 2016-05-29 DIAGNOSIS — D638 Anemia in other chronic diseases classified elsewhere: Secondary | ICD-10-CM | POA: Diagnosis not present

## 2016-05-29 DIAGNOSIS — G40909 Epilepsy, unspecified, not intractable, without status epilepticus: Secondary | ICD-10-CM | POA: Diagnosis not present

## 2016-05-29 DIAGNOSIS — D62 Acute posthemorrhagic anemia: Secondary | ICD-10-CM | POA: Diagnosis not present

## 2016-05-29 DIAGNOSIS — I4891 Unspecified atrial fibrillation: Secondary | ICD-10-CM | POA: Diagnosis not present

## 2016-05-29 DIAGNOSIS — E1142 Type 2 diabetes mellitus with diabetic polyneuropathy: Secondary | ICD-10-CM | POA: Diagnosis not present

## 2016-06-05 ENCOUNTER — Telehealth: Payer: Self-pay | Admitting: Cardiology

## 2016-06-05 DIAGNOSIS — D62 Acute posthemorrhagic anemia: Secondary | ICD-10-CM | POA: Diagnosis not present

## 2016-06-05 DIAGNOSIS — K922 Gastrointestinal hemorrhage, unspecified: Secondary | ICD-10-CM | POA: Diagnosis not present

## 2016-06-05 DIAGNOSIS — D638 Anemia in other chronic diseases classified elsewhere: Secondary | ICD-10-CM | POA: Diagnosis not present

## 2016-06-05 DIAGNOSIS — D500 Iron deficiency anemia secondary to blood loss (chronic): Secondary | ICD-10-CM | POA: Diagnosis not present

## 2016-06-05 DIAGNOSIS — E1142 Type 2 diabetes mellitus with diabetic polyneuropathy: Secondary | ICD-10-CM | POA: Diagnosis not present

## 2016-06-05 DIAGNOSIS — I4891 Unspecified atrial fibrillation: Secondary | ICD-10-CM | POA: Diagnosis not present

## 2016-06-05 DIAGNOSIS — G40909 Epilepsy, unspecified, not intractable, without status epilepticus: Secondary | ICD-10-CM | POA: Diagnosis not present

## 2016-06-05 NOTE — Telephone Encounter (Signed)
Discussed with husband, he measured her pulse which is in the 70s. The patient stays in atrial fibrillation. Therefore as long as she is rate controlled, her afib is good. However I cannot explain her on and off SOB for the past 5 days, husband cannot tell me if she has sign of volume overload such as LE edema or orthopnea. He says it occurs both with exertion and occasionally at rest, but she is not SOB now. It was her home nurse who told her she is in afib today, but based on our record, she stays in afib. I am concerned about possible worsening anemia as a cause as well. I recommend her to check with her PCP to see if can be seen earlier, if not, I would be happy to see her next week myself. She understands that if her symptom worsen, she will need to go to ED.

## 2016-06-05 NOTE — Telephone Encounter (Signed)
New message   Pt husband is calling. Pt c/o Shortness Of Breath: STAT if SOB developed within the last 24 hours or pt is noticeably SOB on the phone  1. Are you currently SOB (can you hear that pt is SOB on the phone)? Not right now  2. How long have you been experiencing SOB? 5 days  3. Are you SOB when sitting or when up moving around? Both   4. Are you currently experiencing any other symptoms? Per pt husband, pt is having afib.

## 2016-06-05 NOTE — Telephone Encounter (Signed)
Spoke with pt husband he states that pt is in afib currently, we have no appts thru next week. Informed to go to the ER, pt adamantly declines ER. I spoke with Isaac Laud and he states that pt is always in AFIB, he will call pt

## 2016-06-06 DIAGNOSIS — R0600 Dyspnea, unspecified: Secondary | ICD-10-CM | POA: Diagnosis not present

## 2016-06-06 DIAGNOSIS — I480 Paroxysmal atrial fibrillation: Secondary | ICD-10-CM | POA: Diagnosis not present

## 2016-06-06 DIAGNOSIS — R110 Nausea: Secondary | ICD-10-CM | POA: Diagnosis not present

## 2016-06-06 NOTE — Telephone Encounter (Signed)
noted 

## 2016-06-08 ENCOUNTER — Other Ambulatory Visit: Payer: Self-pay | Admitting: Adult Health

## 2016-06-10 DIAGNOSIS — D638 Anemia in other chronic diseases classified elsewhere: Secondary | ICD-10-CM | POA: Diagnosis not present

## 2016-06-10 DIAGNOSIS — E1142 Type 2 diabetes mellitus with diabetic polyneuropathy: Secondary | ICD-10-CM | POA: Diagnosis not present

## 2016-06-10 DIAGNOSIS — K922 Gastrointestinal hemorrhage, unspecified: Secondary | ICD-10-CM | POA: Diagnosis not present

## 2016-06-10 DIAGNOSIS — I4891 Unspecified atrial fibrillation: Secondary | ICD-10-CM | POA: Diagnosis not present

## 2016-06-10 DIAGNOSIS — G40909 Epilepsy, unspecified, not intractable, without status epilepticus: Secondary | ICD-10-CM | POA: Diagnosis not present

## 2016-06-10 DIAGNOSIS — D62 Acute posthemorrhagic anemia: Secondary | ICD-10-CM | POA: Diagnosis not present

## 2016-06-11 DIAGNOSIS — G40909 Epilepsy, unspecified, not intractable, without status epilepticus: Secondary | ICD-10-CM | POA: Diagnosis not present

## 2016-06-11 DIAGNOSIS — K922 Gastrointestinal hemorrhage, unspecified: Secondary | ICD-10-CM | POA: Diagnosis not present

## 2016-06-11 DIAGNOSIS — D62 Acute posthemorrhagic anemia: Secondary | ICD-10-CM | POA: Diagnosis not present

## 2016-06-11 DIAGNOSIS — D638 Anemia in other chronic diseases classified elsewhere: Secondary | ICD-10-CM | POA: Diagnosis not present

## 2016-06-11 DIAGNOSIS — E1142 Type 2 diabetes mellitus with diabetic polyneuropathy: Secondary | ICD-10-CM | POA: Diagnosis not present

## 2016-06-11 DIAGNOSIS — I4891 Unspecified atrial fibrillation: Secondary | ICD-10-CM | POA: Diagnosis not present

## 2016-06-19 DIAGNOSIS — I4891 Unspecified atrial fibrillation: Secondary | ICD-10-CM | POA: Diagnosis not present

## 2016-06-19 DIAGNOSIS — E1142 Type 2 diabetes mellitus with diabetic polyneuropathy: Secondary | ICD-10-CM | POA: Diagnosis not present

## 2016-06-19 DIAGNOSIS — D638 Anemia in other chronic diseases classified elsewhere: Secondary | ICD-10-CM | POA: Diagnosis not present

## 2016-06-19 DIAGNOSIS — K922 Gastrointestinal hemorrhage, unspecified: Secondary | ICD-10-CM | POA: Diagnosis not present

## 2016-06-19 DIAGNOSIS — G40909 Epilepsy, unspecified, not intractable, without status epilepticus: Secondary | ICD-10-CM | POA: Diagnosis not present

## 2016-06-19 DIAGNOSIS — D62 Acute posthemorrhagic anemia: Secondary | ICD-10-CM | POA: Diagnosis not present

## 2016-06-20 DIAGNOSIS — M19012 Primary osteoarthritis, left shoulder: Secondary | ICD-10-CM | POA: Diagnosis not present

## 2016-06-20 DIAGNOSIS — M1711 Unilateral primary osteoarthritis, right knee: Secondary | ICD-10-CM | POA: Diagnosis not present

## 2016-06-20 DIAGNOSIS — M75102 Unspecified rotator cuff tear or rupture of left shoulder, not specified as traumatic: Secondary | ICD-10-CM | POA: Diagnosis not present

## 2016-06-27 DIAGNOSIS — M19012 Primary osteoarthritis, left shoulder: Secondary | ICD-10-CM | POA: Diagnosis not present

## 2016-07-03 DIAGNOSIS — M3501 Sicca syndrome with keratoconjunctivitis: Secondary | ICD-10-CM | POA: Diagnosis not present

## 2016-07-03 DIAGNOSIS — H401133 Primary open-angle glaucoma, bilateral, severe stage: Secondary | ICD-10-CM | POA: Diagnosis not present

## 2016-07-03 DIAGNOSIS — Z961 Presence of intraocular lens: Secondary | ICD-10-CM | POA: Diagnosis not present

## 2016-07-03 DIAGNOSIS — H18893 Other specified disorders of cornea, bilateral: Secondary | ICD-10-CM | POA: Diagnosis not present

## 2016-07-11 DIAGNOSIS — I480 Paroxysmal atrial fibrillation: Secondary | ICD-10-CM | POA: Diagnosis not present

## 2016-07-11 DIAGNOSIS — D509 Iron deficiency anemia, unspecified: Secondary | ICD-10-CM | POA: Diagnosis not present

## 2016-07-11 DIAGNOSIS — E785 Hyperlipidemia, unspecified: Secondary | ICD-10-CM | POA: Diagnosis not present

## 2016-07-11 DIAGNOSIS — R531 Weakness: Secondary | ICD-10-CM | POA: Diagnosis not present

## 2016-07-11 DIAGNOSIS — E559 Vitamin D deficiency, unspecified: Secondary | ICD-10-CM | POA: Diagnosis not present

## 2016-07-11 DIAGNOSIS — Z7984 Long term (current) use of oral hypoglycemic drugs: Secondary | ICD-10-CM | POA: Diagnosis not present

## 2016-07-11 DIAGNOSIS — M3500 Sicca syndrome, unspecified: Secondary | ICD-10-CM | POA: Diagnosis not present

## 2016-07-11 DIAGNOSIS — E1143 Type 2 diabetes mellitus with diabetic autonomic (poly)neuropathy: Secondary | ICD-10-CM | POA: Diagnosis not present

## 2016-07-22 DIAGNOSIS — H401133 Primary open-angle glaucoma, bilateral, severe stage: Secondary | ICD-10-CM | POA: Diagnosis not present

## 2016-07-23 DIAGNOSIS — E1143 Type 2 diabetes mellitus with diabetic autonomic (poly)neuropathy: Secondary | ICD-10-CM | POA: Diagnosis not present

## 2016-07-23 DIAGNOSIS — D500 Iron deficiency anemia secondary to blood loss (chronic): Secondary | ICD-10-CM | POA: Diagnosis not present

## 2016-07-23 DIAGNOSIS — M47816 Spondylosis without myelopathy or radiculopathy, lumbar region: Secondary | ICD-10-CM | POA: Diagnosis not present

## 2016-07-23 DIAGNOSIS — I480 Paroxysmal atrial fibrillation: Secondary | ICD-10-CM | POA: Diagnosis not present

## 2016-07-23 DIAGNOSIS — I4891 Unspecified atrial fibrillation: Secondary | ICD-10-CM | POA: Diagnosis not present

## 2016-07-23 DIAGNOSIS — E785 Hyperlipidemia, unspecified: Secondary | ICD-10-CM | POA: Diagnosis not present

## 2016-07-23 DIAGNOSIS — Z7984 Long term (current) use of oral hypoglycemic drugs: Secondary | ICD-10-CM | POA: Diagnosis not present

## 2016-07-23 DIAGNOSIS — D509 Iron deficiency anemia, unspecified: Secondary | ICD-10-CM | POA: Diagnosis not present

## 2016-08-07 DIAGNOSIS — M75102 Unspecified rotator cuff tear or rupture of left shoulder, not specified as traumatic: Secondary | ICD-10-CM | POA: Diagnosis not present

## 2016-08-07 DIAGNOSIS — M1711 Unilateral primary osteoarthritis, right knee: Secondary | ICD-10-CM | POA: Diagnosis not present

## 2016-08-07 DIAGNOSIS — M19012 Primary osteoarthritis, left shoulder: Secondary | ICD-10-CM | POA: Diagnosis not present

## 2016-08-16 DIAGNOSIS — G894 Chronic pain syndrome: Secondary | ICD-10-CM | POA: Diagnosis not present

## 2016-08-16 DIAGNOSIS — M47816 Spondylosis without myelopathy or radiculopathy, lumbar region: Secondary | ICD-10-CM | POA: Diagnosis not present

## 2016-09-16 ENCOUNTER — Ambulatory Visit (INDEPENDENT_AMBULATORY_CARE_PROVIDER_SITE_OTHER): Payer: Medicare Other | Admitting: Neurology

## 2016-09-16 ENCOUNTER — Encounter: Payer: Self-pay | Admitting: Neurology

## 2016-09-16 VITALS — BP 110/78 | HR 90 | Ht 61.0 in | Wt 178.2 lb

## 2016-09-16 DIAGNOSIS — G63 Polyneuropathy in diseases classified elsewhere: Secondary | ICD-10-CM | POA: Diagnosis not present

## 2016-09-16 DIAGNOSIS — G44221 Chronic tension-type headache, intractable: Secondary | ICD-10-CM

## 2016-09-16 DIAGNOSIS — M3509 Sicca syndrome with other organ involvement: Secondary | ICD-10-CM | POA: Diagnosis not present

## 2016-09-16 DIAGNOSIS — I639 Cerebral infarction, unspecified: Secondary | ICD-10-CM | POA: Diagnosis not present

## 2016-09-16 DIAGNOSIS — R269 Unspecified abnormalities of gait and mobility: Secondary | ICD-10-CM | POA: Diagnosis not present

## 2016-09-16 DIAGNOSIS — R251 Tremor, unspecified: Secondary | ICD-10-CM

## 2016-09-16 DIAGNOSIS — I6381 Other cerebral infarction due to occlusion or stenosis of small artery: Secondary | ICD-10-CM

## 2016-09-16 DIAGNOSIS — M3506 Sjogren syndrome with peripheral nervous system involvement: Secondary | ICD-10-CM

## 2016-09-16 MED ORDER — TIZANIDINE HCL 2 MG PO CAPS: 2.0000 mg | ORAL_CAPSULE | Freq: Three times a day (TID) | ORAL | 11 refills | Status: DC

## 2016-09-16 NOTE — Progress Notes (Signed)
Follow-up Visit   Date: 09/16/16    Krystal Clarke MRN: [id number] DOB: April 15, 1935   [name] is a delightful 81 y.o. right-handed Caucasian female with hypertension, Sjogren's syndrome (diagnosed 6629) complicated by neuropathy and vision impairment, migraines, atrial fibrillation (on apixiban), history of shingles affecting left T6 (07/2009) and stroke (September 2014, residual word-finding difficulty) returning to the clinic for follow-up visit   History of present illness: She was under the care of Dr. Erling Cruz at [facility] for a over 30 years for migraines, stroke, peripheral neuropathy, headaches, and gait instability. She has a long history of migraines or started at the age of 37. Her headaches were well controlled on Depakote 250 twice a day. However, it was stopped due to low platelets and anticoagulation. She complains of right-sided throbbing and pulsating pain. She has nausea, photophobia, and phonophobia.  Previously tried medication for headaches is depakote, Botox (2 trials without benefit) and maxalt. Being on apixiban, she is unable to take NSAIDs due to bleeding risk.  She was given a prednisone taper dose with dramatic improvement of headaches and has been relatively well since then.   She was admitted to the hospital in September 2014 for community-acquired pneumonia and found to have new onset atrial fibrillation. Due to worsening headaches, MRI of the brain was obtained in October which showed a small left thalamic subacute stroke.  In 2015, she was started on Lyrica for headaches and neuropathy and responded well.   . - UPDATE 05/10/2015:  She presents today with two new issues (1) Around early January, she started having new complaints of intense sneezing for 5-7 times, followed by 5-minutes of bilateral arms shakes.  There is no loss of consciousness, weakness, numbness/tingling, or confusion. It usually occurs when she is sitting in her car. She is  able to communicate during these spells. (2) She also has another sensation of both hands feeling cold and developing painful tingling, only occuring when sitting on the commode to void or defecate, which has been ongoing since earlier this year.  There is no weakness. This sensation is worse when she is straining.  These symptoms do not occur when sitting, sneezing, coughing, or laughing.  - UPDATE 08/16/2015:  She continues to have sneezing spells and shaking of the arms, but her EEG is normal.  She did not have NCS/EMG because of GI illness and fistula.  She continues to have right hand numbness, and does not wish to have NCS/EMG until her other medical issues are better.  Neuropathy is well controlled on Lyrica 150mg -300mg .  Headaches continue to be mild and infrequent.  She is mostly bothered by his GI symptoms and is struggling to decide whether she would like to undergo surgery for this or not.  - UPDATE 04/12/2016:  She was hospitalized in November with colovesical fistula s/p robotic colectomy & bladder closure 02/07/2016 and has been recovering from this. She is now back at home and getting home nursing care and PT.  Since using her walker at all times, she has not suffered any falls.  Today, she has new complaints of intermittent bee sting sensation over the base of neck about 5 times per week, lasting a few hours. This does not radiate into her scalp.  The pain can be very bothersome sometimes.  She denies any headaches or neck pain.  Her neuropathy has becomes much less painful and well controlled with Lyrica 150-300.  She has noticed less frequent spells of sneezing  and arm shaking.  - UPDATE 09/16/2016:  She is here for 6 month appointment.  She no longer has stabbing or stinging pain in her neck, but has low dull headache.  Her painful neuropathy is well-controlled on Lyrica 150-300mg . She is having greater difficulty with walking and is interested in getting an electric scooter as her husband  cannot push a wheelchair. She is very bothered by the increased frequency of sneezing that occurs in conjunction with right arm shaking, which occurs daily and more intense than in the past. She is awake during this time and there is no LOC.  Over the past three weeks, she also feels as if she may have had a minor stroke because her confusion and word-finding difficulty is worse. No lateralizing weakness of paresthesias. She remains on Eliquis for atrial fibrillation and had some palpitations a few weeks ago.   Medications:  Current Outpatient Prescriptions on File Prior to Visit  Medication Sig Dispense Refill  . apixaban (ELIQUIS) 5 MG TABS tablet Take 1 tablet (5 mg total) by mouth 2 (two) times daily. 180 tablet 2  . diltiazem (CARDIZEM CD) 180 MG 24 hr capsule TAKE 1 CAPSULE (180 MG TOTAL) BY MOUTH DAILY. 90 capsule 3  . dorzolamide (TRUSOPT) 2 % ophthalmic solution Place 1 drop into both eyes 2 (two) times daily.    . fenofibrate (TRICOR) 145 MG tablet Take 145 mg by mouth at bedtime.   0  . metFORMIN (GLUCOPHAGE-XR) 500 MG 24 hr tablet Take 500 mg by mouth every evening.     Marland Kitchen omeprazole-sodium bicarbonate (ZEGERID) 40-1100 MG capsule Take 1 capsule by mouth daily before breakfast.    . ondansetron (ZOFRAN) 4 MG tablet Take 4 mg by mouth every 8 (eight) hours as needed for nausea or vomiting.    . pregabalin (LYRICA) 150 MG capsule Take 150-300 mg by mouth 2 (two) times daily. Pt takes one capsule every morning and two at bedtime.    . timolol (BETIMOL) 0.5 % ophthalmic solution Place 1 drop into both eyes 2 (two) times daily.     . traMADol (ULTRAM) 50 MG tablet Take 1-2 tablets (50-100 mg total) by mouth every 6 (six) hours as needed for moderate pain or severe pain. 30 tablet 0  . traZODone (DESYREL) 50 MG tablet Take 25-50 mg by mouth at bedtime as needed for sleep.     No current facility-administered medications on file prior to visit.     Allergies:  Allergies  Allergen Reactions    . Oxycodone-Aspirin Nausea And Vomiting  . Codeine Nausea And Vomiting  . Hydrocodone Nausea And Vomiting  . Morphine And Related Nausea And Vomiting  . Oxycodone Nausea And Vomiting  . Pilocarpine Other (See Comments)    Reaction:  Unknown   . Rivaroxaban Other (See Comments)    Reaction:  Back pain/headaches      Review of Systems:  CONSTITUTIONAL: No fevers, chills, night sweats, +weight loss.   EYES: +visual changes or eye pain ENT: No hearing changes.  No history of nose bleeds.   RESPIRATORY: +cough, wheezing and shortness of breath.   CARDIOVASCULAR: Negative for chest pain, and palpitations.   GI: +for abdominal discomfort, blood in stools or black stools.  No recent change in bowel habits.   GU:  No history of incontinence.  MUSCLOSKELETAL: No history of joint pain or swelling.  No myalgias.   SKIN: No for lesions, rash, and itching.   ENDOCRINE: Negative for cold or heat intolerance, polydipsia  or goiter.   PSYCH:  No depression or anxiety symptoms.   NEURO: As Above.   Vital Signs:  BP 110/78   Pulse 90   Ht 5\' 1"  (1.549 m)   Wt 178 lb 4 oz (80.9 kg)   SpO2 96%   BMI 33.68 kg/m   Neurological Exam: MENTAL STATUS including orientation to time, place, person, recent and remote memory, attention span and concentration, language, and fund of knowledge is fair.   Speech is not dysarthric.  I do not appreciate any word-finding difficulty.  CRANIAL NERVES:  Severe visual impairment bilaterally, she is only able to see colors. Irregular pupils, but reactive to light.  Mild bilateral ptosis. Face is symmetric. Dry oral mucosa and tongue.   MOTOR: Motor strength is 5-/5 throughout, except L dorsiflexion and toe extension is 4/5.  Tone is normal.    SENSATION:  Vibration intact in the upper extremities and absent distal to ankles bilaterally.    COORDINATION/GAIT: Gait is moderately wide-based with small, short steps, stooped posture, assisted with walker.    Data: MRI brain wo contrast 12/23/2012: Very small acute/ subacute left thalamic nonhemorrhagic infarct. Prominent small vessel disease type changes. No intracranial hemorrhage. Mild atrophy without hydrocephalus.  Prior clinic notes from Dr. Rexene Alberts, previous neurological workup includes:  EEG 06/03/1995, 08/31/2008, 05/25/2015: normal  CSF 1991: normal  Lab Results  Component Value Date   VITAMINB12 260 04/12/2016   Lab Results  Component Value Date   TSH 0.834 12/06/2012    IMPRESSION/PLAN:   1. Mixed large and small fiber sensorimotor peripheral neuropathy due to Sjogren's disease, stable  - Numbness involving glove-stocking distribution as a progression of the disease  - Pain is controlled with Lyrica 150mg  in the morning and 300mg  at bedtime  2. Migraine without aura, chronic daily headaches - stable  - Previously used:  VPA (stopped due to low PLT), triptans (stroke), flexeril (no improvement), botox  - She had significant benefit with trial of medrol dose pak  - Continue Lyrica  - Start tizandine 2mg  for tension headaches  3. Vitamin B12 deficiency  - Start OTC vitamin B12 1032mcg  4.  Spells of sneezing with associated abnormal hand movements of the right arm  - These are not suggeestive seizures based on history.  EEG and US carotids nondiagnostic  - Repeat EEG due to increased frequency of spells, although overall suspicion for seizure is very low  - Ambulatory EEG declined  5. Multifactorial gait instability with generalized weakness, uses 4-wheeled rollator.   - She would like to get an electric scooter, however with her visual deficits, I'm not sure if this is the best option  - She may need a mobility assessment for recommendations  6. Left thalamic stroke, likely cardioembolic due to afib on apixiban  - CT head declined to assess for interval stroke  7. Age-related memory loss possibly transitioning into vascular dementia  - Neuropsychological testing 05/25/2010:  Decreased verbal fluency, otherwise normal exam.  Return to clinic in 58-months  The duration of this appointment visit was 40 minutes of face-to-face time with the patient.  Greater than 50% of this time was spent in counseling, explanation of diagnosis, planning of further management, and coordination of care.   Thank you for allowing me to participate in patient's care.  If I can answer any additional questions, I would be pleased to do so.    Sincerely,    Avalon Coppinger K. Posey Pronto, DO

## 2016-09-16 NOTE — Patient Instructions (Addendum)
1.  Routine EEG  2.  Start tizanidine 2mg  at bedtime  3.  Start vitamin B12 1016mcg daily

## 2016-09-19 DIAGNOSIS — M19012 Primary osteoarthritis, left shoulder: Secondary | ICD-10-CM | POA: Diagnosis not present

## 2016-09-26 ENCOUNTER — Ambulatory Visit (INDEPENDENT_AMBULATORY_CARE_PROVIDER_SITE_OTHER): Payer: Medicare Other | Admitting: Neurology

## 2016-09-26 DIAGNOSIS — G63 Polyneuropathy in diseases classified elsewhere: Secondary | ICD-10-CM | POA: Diagnosis not present

## 2016-09-26 DIAGNOSIS — M3509 Sicca syndrome with other organ involvement: Secondary | ICD-10-CM | POA: Diagnosis not present

## 2016-09-26 DIAGNOSIS — R251 Tremor, unspecified: Secondary | ICD-10-CM | POA: Diagnosis not present

## 2016-09-26 DIAGNOSIS — G44221 Chronic tension-type headache, intractable: Secondary | ICD-10-CM

## 2016-09-26 DIAGNOSIS — M3506 Sjogren syndrome with peripheral nervous system involvement: Secondary | ICD-10-CM

## 2016-09-30 NOTE — Procedures (Signed)
ELECTROENCEPHALOGRAM REPORT  Date of Study: 09/26/2016  Patient's Name: Krystal Clarke MRN: [id number] Date of Birth: 20-Apr-1935  Referring Provider: Dr. Narda Amber  Clinical History: This is a 81 year old woman with recurrent episodes of sneezing with abnormal hand movements in the right arm. EEG for classification.  Medications: LYRICA ELIQUIS  CARDIZEM CD TRUSOPT  TRICOR metFORMIN (XR)   [place]  Technical Summary: A multichannel digital 1-hour EEG recording measured by the international 10-20 system with electrodes applied with paste and impedances below 5000 ohms performed in our laboratory with EKG monitoring in an awake and asleep patient.  Hyperventilation was not performed. Photic stimulation was performed.  The digital EEG was referentially recorded, reformatted, and digitally filtered in a variety of bipolar and referential montages for optimal display.    Description: The patient is awake and asleep during the recording.  During maximal wakefulness, there is a symmetric, medium voltage 8-8.5 Hz posterior dominant rhythm that attenuates with eye opening.  The record is symmetric.  During drowsiness and sleep, there is an increase in theta and delta slowing of the background,with shifting asymmetry seen over the bilateral temporal regions. Poorly formed vertex waves and sleep spindles were seen.  Photic stimulation did not elicit any abnormalities.  There were no epileptiform discharges or electrographic seizures seen.    EKG lead was unremarkable.  Impression: This 1-hour awake and asleep EEG is within normal limits for age.   Clinical Correlation: A normal EEG does not exclude a clinical diagnosis of epilepsy.  If further clinical questions remain, prolonged EEG may be helpful.  Clinical correlation is advised.   Ellouise Newer, M.D.

## 2016-10-01 ENCOUNTER — Telehealth: Payer: Self-pay | Admitting: *Deleted

## 2016-10-01 NOTE — Telephone Encounter (Signed)
Left message informing patient.

## 2016-10-01 NOTE — Telephone Encounter (Signed)
-----   Message from Cameron Sprang, MD sent at 10/01/2016  9:44 AM EDT ----- Pls her know EEG was normal, thanks

## 2016-10-02 DIAGNOSIS — H18893 Other specified disorders of cornea, bilateral: Secondary | ICD-10-CM | POA: Diagnosis not present

## 2016-10-02 DIAGNOSIS — M3501 Sicca syndrome with keratoconjunctivitis: Secondary | ICD-10-CM | POA: Diagnosis not present

## 2016-10-08 ENCOUNTER — Ambulatory Visit: Payer: Medicare Other | Attending: Neurology | Admitting: Physical Therapy

## 2016-10-08 DIAGNOSIS — R2689 Other abnormalities of gait and mobility: Secondary | ICD-10-CM

## 2016-10-08 DIAGNOSIS — M6281 Muscle weakness (generalized): Secondary | ICD-10-CM

## 2016-10-08 NOTE — Therapy (Signed)
[facility] [address] [place], [place], [address] Phone: [phone]   Fax:  [phone]  Physical Therapy Evaluation  Patient Details  Name: Krystal Clarke MRN: [id number] Date of Birth: Nov 26, 1935 Referring Provider: Narda Amber, DO  Encounter Date: 10/08/2016      PT End of Session - 10/08/16 1858    Visit Number 1   Authorization Type Medicare/Cigna   PT Start Time 5009   PT Stop Time 1039   PT Time Calculation (min) 75 min      Past Medical History:  Diagnosis Date  . Acute respiratory failure with hypoxia ([place]) 12/06/2012  . Allergy   . Blindness of right eye   . Complication of anesthesia   . Dehydration with hyponatremia 12/07/2012  . Diabetes mellitus without complication ([place])    diagnosed 2 weeks ago- No CBG meter as of yet.  Marland Kitchen DIVERTICULITIS, HX OF 01/27/2009   Qualifier: Diagnosis of  By: Jerold Coombe    . Gait disorder 06/05/2012  . GERD (gastroesophageal reflux disease)   . Glaucoma   . Glucose intolerance (impaired glucose tolerance)   . History of diverticulitis   . Hypertension   . Legally blind   . Migraine triggered seizures ([place])    history of  . Migraines   . Neuropathy   . OP (osteoporosis)   . Osteoarthritis    osteoarthritis -right knee. uses walker. Left shoulder -"Limited ROM"  . Paroxysmal atrial fibrillation ([place]) 12/06/2012   Echo (12/07/12): Moderate focal basal hypertrophy of the septum, vigorous LV function, EF 65-70%, indeterminate diastolic function, normal wall motion, trivial MR, moderate LAE, trivial TR;  Xarelto started 11/2012  . Peripheral neuropathy    neuropathy  . Pneumonia   . PONV (postoperative nausea and vomiting)    no problems other past few surgeries  . SHINGLES, HX OF 07/17/2009   Qualifier: Diagnosis of  By: Jerold Coombe    . Siriasis   . Sjogren's syndrome ([place])    bilateral vision  . Sleep apnea    does not wear CPAP  . Stroke [facility])     residual - rare Intermittent expressive aphasia,    Past Surgical History:  Procedure Laterality Date  . BONE TUMOR EXCISION Right    arm  . CATARACT EXTRACTION    . COLONOSCOPY WITH PROPOFOL N/A 06/09/2015   Procedure: COLONOSCOPY WITH PROPOFOL;  Surgeon: Ronald Lobo, MD;  Location: [facility];  Service: Endoscopy;  Laterality: N/A;  . DILATION AND CURETTAGE OF UTERUS    . ESOPHAGOGASTRODUODENOSCOPY N/A 04/17/2016   Procedure: ESOPHAGOGASTRODUODENOSCOPY (EGD);  Surgeon: Teena Irani, MD;  Location: [facility] ENDOSCOPY;  Service: Endoscopy;  Laterality: N/A;  . ESOPHAGOGASTRODUODENOSCOPY (EGD) WITH PROPOFOL N/A 06/09/2015   Procedure: ESOPHAGOGASTRODUODENOSCOPY (EGD) WITH PROPOFOL;  Surgeon: Ronald Lobo, MD;  Location[facility] ENDOSCOPY;  Service: Endoscopy;  Laterality: N/A;  . EYE SURGERY    . FOOT FRACTURE SURGERY Left 2007  . GIVENS CAPSULE STUDY N/A 04/17/2016   Procedure: GIVENS CAPSULE STUDY;  Surgeon: Teena Irani, MD;  Location: [facility];  Service: Endoscopy;  Laterality: N/A;  . KNEE ARTHROSCOPY Right   . LUMBAR LAMINECTOMY    . ROBOT ASSISTED LAPAROSCOPIC PARTIAL COLECTOMY  02/07/2016   Procedure: ROBOT ASSISTED LAPAROSCOPIC PARTIAL COLECTOMY;  Surgeon: Leighton Ruff, MD;  Location: [facility];  Service: General;;  . SHOULDER OPEN ROTATOR CUFF REPAIR    . TONSILLECTOMY AND ADENOIDECTOMY    . TOTAL ABDOMINAL HYSTERECTOMY W/ BILATERAL SALPINGOOPHORECTOMY  There were no vitals filed for this visit.       Subjective Assessment - 10/08/16 1853    Subjective Pt presents for power wheelchair evaluation - Josh Cadle, ATP from [facility] present for eval   Patient is accompained by: Family member  husband   Patient Stated Goals obtain power wheelchair   Currently in Pain? No/denies            [facility] PT Assessment - 10/08/16 0955      Assessment   Medical Diagnosis Neuropathy:  Sjogren's syndrome   Referring Provider Narda Amber, DO   Onset Date/Surgical Date --  2014      Precautions   Precautions Fall;Other (comment)  legally blind     Balance Screen   Has the patient fallen in the past 6 months No   Has the patient had a decrease in activity level because of a fear of falling?  No   Is the patient reluctant to leave their home because of a fear of falling?  No            Objective measurements completed on examination: See above findings.          Complete LMN for power wheelchair to be finalized after home assessment/wheelchair trial for safety with wheelchair operation by pt - to be completed by  Vendor Josh Cadle, ATP with [facility].  Recommending [employer] power wheelchair with captain's seat                  Plan - 10/08/16 1859    Clinical Impression Statement Pt is a 81 yr old female with idiopathic neuropathy, gait abnormality, osteoporosis and Sjogren's syndrome.  Pt is at high fall risk per TUG score 29.75 secs and uses RW for assistance with ambulation. Pt evaluated for power wheelchair for independence and safety with mobility.   Clinical Presentation Stable   PT Frequency One time visit   PT Treatment/Interventions ADLs/Self Care Home Management;Patient/family education;Other (comment)  wheelchair management   Consulted and Agree with Plan of Care Patient;Family member/caregiver   Family Member Consulted spouse      Patient will benefit from skilled therapeutic intervention in order to improve the following deficits and impairments:  Abnormal gait, Decreased strength, Decreased balance  Visit Diagnosis: Muscle weakness (generalized) - Plan: PT plan of care cert/re-cert  Other abnormalities of gait and mobility - Plan: PT plan of care cert/re-cert      G-Codes - 62/70/35 1905    Functional Assessment Tool Used (Outpatient Only) TUG score 29.75 secs with RW   Functional Limitation Mobility: Walking and moving around   Mobility: Walking and Moving Around Current Status [phone]) At least 60 percent but less than 80  percent impaired, limited or restricted   Mobility: Walking and Moving Around Goal Status [phone]) At least 60 percent but less than 80 percent impaired, limited or restricted   Mobility: Walking and Moving Around Discharge Status [phone]) At least 60 percent but less than 80 percent impaired, limited or restricted       Problem List Patient Active Problem List   Diagnosis Date Noted  . GI bleed 04/16/2016  . Symptomatic anemia 04/16/2016  . Hypoxia - on oxygen 02/10/2016  . Diabetes mellitus without complication ([place])   . Obesity (BMI 30-39.9) 02/08/2016  . Lichen sclerosus 67/89/3810  . 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 02/07/2016 02/07/2016  . Chest pain 11/04/2014  . Elevated white blood cell count 11/04/2014  . B12 deficiency 05/20/2013  .  Seizure disorder ([place]) 12/07/2012  . Atrial fibrillation ([place]) 12/06/2012  . Sjogren's syndrome ([place])   . Hereditary and idiopathic peripheral neuropathy 05/09/2012  . 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 ([place]) 05/09/2012  . History of detached retina repair 02/24/2012  . Primary open angle glaucoma of both eyes, severe stage 02/24/2012  . ANEMIA, B12 DEFICIENCY 07/19/2009  . Anxiety state 01/27/2009  . Sleep apnea 12/19/2008  . MIXED HYPERLIPIDEMIA 07/18/2008  . HYPERTRIGLYCERIDEMIA 08/28/2006  . Migraine headache 08/28/2006  . Essential hypertension 08/28/2006  . GERD 08/28/2006  . PSORIASIS 08/28/2006  . Osteoarthritis 08/28/2006  . OSTEOPOROSIS 08/28/2006    Alda Lea, PT 10/08/2016, 7:11 PM  [facility] [address] [place], [place], [address] Phone: [phone]   Fax:  [phone]  Name: CHAYLA SHANDS MRN: [id number] Date of Birth: 1935/12/27

## 2016-10-22 DIAGNOSIS — L989 Disorder of the skin and subcutaneous tissue, unspecified: Secondary | ICD-10-CM | POA: Diagnosis not present

## 2016-10-22 DIAGNOSIS — I480 Paroxysmal atrial fibrillation: Secondary | ICD-10-CM | POA: Diagnosis not present

## 2016-10-22 DIAGNOSIS — D509 Iron deficiency anemia, unspecified: Secondary | ICD-10-CM | POA: Diagnosis not present

## 2016-10-22 DIAGNOSIS — R531 Weakness: Secondary | ICD-10-CM | POA: Diagnosis not present

## 2016-10-22 DIAGNOSIS — E538 Deficiency of other specified B group vitamins: Secondary | ICD-10-CM | POA: Diagnosis not present

## 2016-10-22 DIAGNOSIS — E559 Vitamin D deficiency, unspecified: Secondary | ICD-10-CM | POA: Diagnosis not present

## 2016-10-22 DIAGNOSIS — E1143 Type 2 diabetes mellitus with diabetic autonomic (poly)neuropathy: Secondary | ICD-10-CM | POA: Diagnosis not present

## 2016-10-22 DIAGNOSIS — E785 Hyperlipidemia, unspecified: Secondary | ICD-10-CM | POA: Diagnosis not present

## 2016-10-30 DIAGNOSIS — M1711 Unilateral primary osteoarthritis, right knee: Secondary | ICD-10-CM | POA: Diagnosis not present

## 2016-11-05 ENCOUNTER — Encounter: Payer: Self-pay | Admitting: Neurology

## 2016-11-05 ENCOUNTER — Ambulatory Visit (INDEPENDENT_AMBULATORY_CARE_PROVIDER_SITE_OTHER): Payer: Medicare Other | Admitting: Neurology

## 2016-11-05 VITALS — BP 110/80 | HR 94 | Ht 61.0 in | Wt 178.2 lb

## 2016-11-05 DIAGNOSIS — M19012 Primary osteoarthritis, left shoulder: Secondary | ICD-10-CM

## 2016-11-05 DIAGNOSIS — M3506 Sjogren syndrome with peripheral nervous system involvement: Secondary | ICD-10-CM

## 2016-11-05 DIAGNOSIS — G63 Polyneuropathy in diseases classified elsewhere: Secondary | ICD-10-CM

## 2016-11-05 DIAGNOSIS — I639 Cerebral infarction, unspecified: Secondary | ICD-10-CM

## 2016-11-05 DIAGNOSIS — Z7409 Other reduced mobility: Secondary | ICD-10-CM

## 2016-11-05 DIAGNOSIS — R269 Unspecified abnormalities of gait and mobility: Secondary | ICD-10-CM

## 2016-11-05 DIAGNOSIS — M3509 Sicca syndrome with other organ involvement: Secondary | ICD-10-CM

## 2016-11-05 NOTE — Progress Notes (Signed)
Follow-up Visit   Date: 11/05/16    Krystal Clarke MRN: [id number] DOB: 01-06-1936   [name] is a 81 y.o. right-handed Caucasian female with hypertension, Sjogren's syndrome (diagnosed 9485) complicated by neuropathy and vision impairment, migraines, atrial fibrillation (on apixiban), history of shingles affecting left T6 (07/2009) and left thalamic stroke (September 2014, residual word-finding difficulty) here for mobility examination.    She has been under the care of Dr. Erling Cruz at [facility] for a over 30 years for migraines, stroke, peripheral neuropathy, headaches, and gait instability and established care with me in 2015. She had long history of neuropathy due to Sjogren's syndrome manifesting with painful paresthesias of the feet and lower legs, and gait imbalance. Neuropathic pain is well-controlled on Lyrica 150 in the morning and 300mg  at bedtime.  She has suffered several falls over the past year, despite using a walker.  In addition to neuropathy, her gait is limited by her generalized deconitioning, vision, and arthritis.  She has osteoarthritis of the left shoulder which makes gross motor tasks with her left arm, such as reaching for objects, very difficult.  She is able to bathing and dress herself. She needs some help with feeding at times.  She is unable to cook, do laundry, or other household chores.  At home, she is using a a rollator but is having greater difficulty walking to the kitchen and bathroom, as well as transferring off the commode.  She does not have a wheelchair because neither her or her husband have the physical conditioning to push it.   Medications:  Current Outpatient Prescriptions on File Prior to Visit  Medication Sig Dispense Refill  . apixaban (ELIQUIS) 5 MG TABS tablet Take 1 tablet (5 mg total) by mouth 2 (two) times daily. 180 tablet 2  . diltiazem (CARDIZEM CD) 180 MG 24 hr capsule TAKE 1 CAPSULE (180 MG TOTAL) BY MOUTH DAILY.  90 capsule 3  . dorzolamide (TRUSOPT) 2 % ophthalmic solution Place 1 drop into both eyes 2 (two) times daily.    . fenofibrate (TRICOR) 145 MG tablet Take 145 mg by mouth at bedtime.   0  . metFORMIN (GLUCOPHAGE-XR) 500 MG 24 hr tablet Take 500 mg by mouth every evening.     Marland Kitchen omeprazole-sodium bicarbonate (ZEGERID) 40-1100 MG capsule Take 1 capsule by mouth daily before breakfast.    . ondansetron (ZOFRAN) 4 MG tablet Take 4 mg by mouth every 8 (eight) hours as needed for nausea or vomiting.    . pregabalin (LYRICA) 150 MG capsule Take 150-300 mg by mouth 2 (two) times daily. Pt takes one capsule every morning and two at bedtime.    . timolol (BETIMOL) 0.5 % ophthalmic solution Place 1 drop into both eyes 2 (two) times daily.     . tizanidine (ZANAFLEX) 2 MG capsule Take 1 capsule (2 mg total) by mouth 3 (three) times daily. 30 capsule 11  . traMADol (ULTRAM) 50 MG tablet Take 1-2 tablets (50-100 mg total) by mouth every 6 (six) hours as needed for moderate pain or severe pain. 30 tablet 0  . traZODone (DESYREL) 50 MG tablet Take 25-50 mg by mouth at bedtime as needed for sleep.     No current facility-administered medications on file prior to visit.     Allergies:  Allergies  Allergen Reactions  . Oxycodone-Aspirin Nausea And Vomiting  . Codeine Nausea And Vomiting  . Hydrocodone Nausea And Vomiting  . Morphine And Related Nausea And Vomiting  .  Oxycodone Nausea And Vomiting  . Pilocarpine Other (See Comments)    Reaction:  Unknown   . Rivaroxaban Other (See Comments)    Reaction:  Back pain/headaches      Review of Systems:  CONSTITUTIONAL: No fevers, chills, night sweats, weight loss.   EYES: +visual changes or eye pain ENT: No hearing changes.  No history of nose bleeds.   RESPIRATORY: No cough, wheezing and shortness of breath.   CARDIOVASCULAR: Negative for chest pain, and palpitations.   GI: No for abdominal discomfort, blood in stools or black stools.  No recent change  in bowel habits.   GU:  No history of incontinence.  MUSCLOSKELETAL: +history of joint pain or swelling.  +myalgias.   SKIN: No for lesions, rash, and itching.   ENDOCRINE: Negative for cold or heat intolerance, polydipsia or goiter.   PSYCH:  No depression or anxiety symptoms.   NEURO: As Above. +weakness, +imbalance   Vital Signs:  BP 110/80   Pulse 94   Ht 5\' 1"  (1.549 m)   Wt 178 lb 3 oz (80.8 kg)   SpO2 94%   BMI 33.67 kg/m   GENERAL:  Sitting comfortably, no acute distress Cardiac:  Irregularly irregular Pulm:  Clear to auscultation Ext:  No edema  Neurological Exam: MENTAL STATUS including orientation to time, place, person, recent and remote memory, attention span and concentration, language, and fund of knowledge is fair.   Speech is not dysarthric.    CRANIAL NERVES:  Moderate visual impairment bilaterally, able to see colors. Irregular pupils, but reactive to light.  Mild bilateral ptosis. Face is symmetric. Dry oral mucosa and tongue.   MOTOR: Motor strength is 5-/5 throughout, except L deltoid is 4/5,  L dorsiflexion and toe extension is 4/5.  Tone is normal.    SENSATION:  Vibration intact in the upper extremities and absent distal to ankles bilaterally.    COORDINATION/GAIT: She is unable to stand from chair without using arms.  Gait is moderately wide-based with small, short steps, stooped posture, assisted with rollator.   Data: MRI brain wo contrast 12/23/2012: Very small acute/ subacute left thalamic nonhemorrhagic infarct. Prominent small vessel disease type changes. No intracranial hemorrhage. Mild atrophy without hydrocephalus.   IMPRESSION/PLAN:   Krystal Clarke is a 81 year-old female here today for mobility examination.  Her mobility limitation is due to progressive neurological deterioration of peripheral neuropathy (due to Sjogren's syndrome) as well as deconditioning and osteoarthritis.  Despite using her walker, she is having falls due to imbalance and  weakness.  She is unable to use a manual wheelchair because of left arm weakness and shoulder arthritis.  Therefore, I recommend a power wheelchair.  She does have physical and cognitive capability to operate a power wheelchair.  I have reviewed the PT evaluation and concur with their recommendations.    The duration of this appointment visit was 30 minutes of face-to-face time with the patient.  Greater than 50% of this time was spent in counseling, explanation of diagnosis, planning of further management, and coordination of care.   Thank you for allowing me to participate in patient's care.  If I can answer any additional questions, I would be pleased to do so.    Sincerely,    Kateline Kinkade K. Posey Pronto, DO

## 2016-11-08 ENCOUNTER — Other Ambulatory Visit: Payer: Self-pay | Admitting: Cardiology

## 2016-11-08 DIAGNOSIS — R6889 Other general symptoms and signs: Secondary | ICD-10-CM | POA: Diagnosis not present

## 2016-11-08 DIAGNOSIS — I480 Paroxysmal atrial fibrillation: Secondary | ICD-10-CM | POA: Diagnosis not present

## 2016-11-08 DIAGNOSIS — R5382 Chronic fatigue, unspecified: Secondary | ICD-10-CM | POA: Diagnosis not present

## 2016-11-08 DIAGNOSIS — Z111 Encounter for screening for respiratory tuberculosis: Secondary | ICD-10-CM | POA: Diagnosis not present

## 2016-11-08 DIAGNOSIS — R61 Generalized hyperhidrosis: Secondary | ICD-10-CM | POA: Diagnosis not present

## 2016-11-08 DIAGNOSIS — D509 Iron deficiency anemia, unspecified: Secondary | ICD-10-CM | POA: Diagnosis not present

## 2016-11-11 ENCOUNTER — Ambulatory Visit
Admission: RE | Admit: 2016-11-11 | Discharge: 2016-11-11 | Disposition: A | Discharge status: Home / Self Care | Payer: Medicare Other | Source: Ambulatory Visit | Attending: Family Medicine | Admitting: Family Medicine

## 2016-11-11 ENCOUNTER — Other Ambulatory Visit: Payer: Self-pay | Admitting: Family Medicine

## 2016-11-11 DIAGNOSIS — M3501 Sicca syndrome with keratoconjunctivitis: Secondary | ICD-10-CM | POA: Diagnosis not present

## 2016-11-11 DIAGNOSIS — H1830 Unspecified corneal membrane change: Secondary | ICD-10-CM | POA: Diagnosis not present

## 2016-11-11 DIAGNOSIS — I4891 Unspecified atrial fibrillation: Secondary | ICD-10-CM | POA: Diagnosis not present

## 2016-11-11 DIAGNOSIS — H18893 Other specified disorders of cornea, bilateral: Secondary | ICD-10-CM | POA: Diagnosis not present

## 2016-11-11 DIAGNOSIS — H401133 Primary open-angle glaucoma, bilateral, severe stage: Secondary | ICD-10-CM | POA: Diagnosis not present

## 2016-11-11 DIAGNOSIS — I480 Paroxysmal atrial fibrillation: Secondary | ICD-10-CM

## 2016-11-19 DIAGNOSIS — Z23 Encounter for immunization: Secondary | ICD-10-CM | POA: Diagnosis not present

## 2016-11-20 IMAGING — CR DG FINGER RING 2+V*R*
1 series · 1 of 1 positions shown · non-contrast
Comparison: None.

CLINICAL DATA: Third finger pain and swelling.

EXAM:
RIGHT RING FINGER 2+V

[PA]
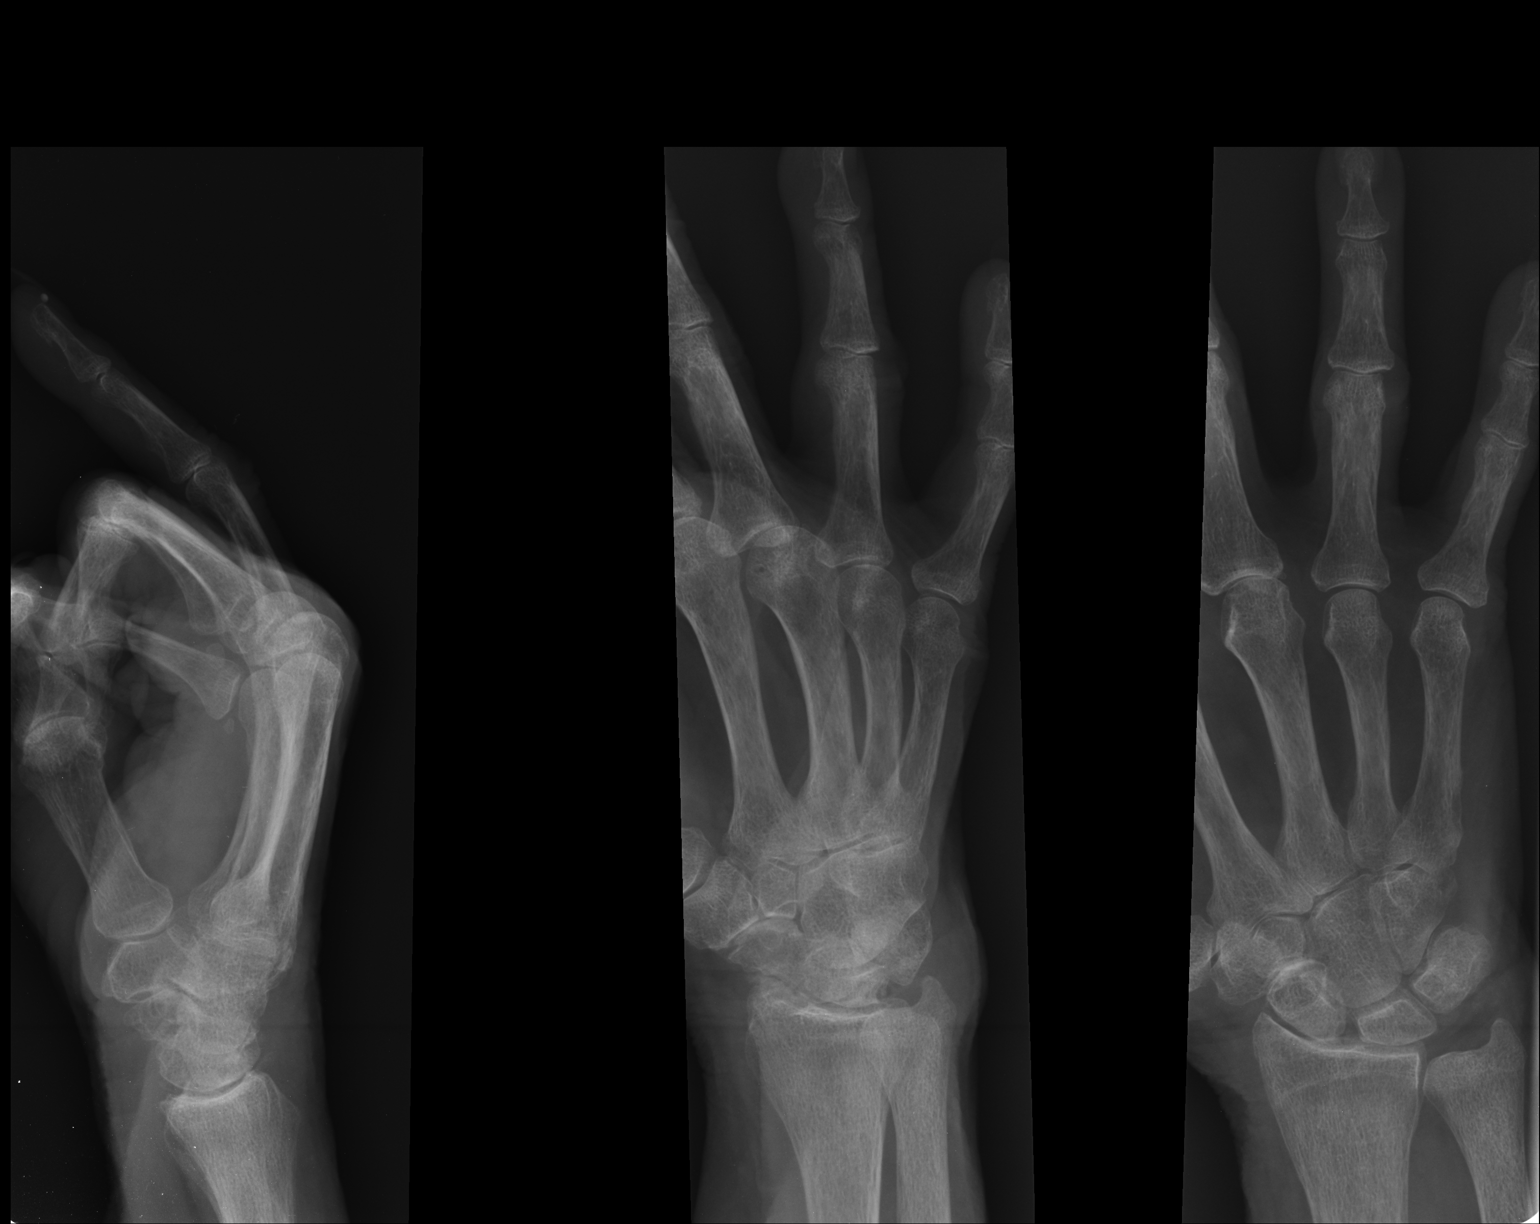

[1 of 1 positions shown; findings below may reference images not displayed]

FINDINGS: There is a 1 mm density along the medial side of the nail of the
third digit. There are minimal degenerative changes over the
interphalangeal joints and third MCP joint. There are degenerative
changes over the carpal bones and radiocarpal joint. There is no
acute fracture or dislocation.
IMPRESSION: No acute findings.

Degenerative changes as described.

## 2016-11-25 DIAGNOSIS — H401133 Primary open-angle glaucoma, bilateral, severe stage: Secondary | ICD-10-CM | POA: Diagnosis not present

## 2016-12-08 ENCOUNTER — Other Ambulatory Visit: Payer: Self-pay | Admitting: Neurology

## 2016-12-09 DIAGNOSIS — H1830 Unspecified corneal membrane change: Secondary | ICD-10-CM | POA: Diagnosis not present

## 2016-12-09 DIAGNOSIS — M3501 Sicca syndrome with keratoconjunctivitis: Secondary | ICD-10-CM | POA: Diagnosis not present

## 2016-12-09 DIAGNOSIS — M3500 Sicca syndrome, unspecified: Secondary | ICD-10-CM | POA: Diagnosis not present

## 2016-12-09 DIAGNOSIS — H18893 Other specified disorders of cornea, bilateral: Secondary | ICD-10-CM | POA: Diagnosis not present

## 2016-12-12 DIAGNOSIS — G629 Polyneuropathy, unspecified: Secondary | ICD-10-CM | POA: Diagnosis not present

## 2016-12-12 DIAGNOSIS — R5382 Chronic fatigue, unspecified: Secondary | ICD-10-CM | POA: Diagnosis not present

## 2016-12-12 DIAGNOSIS — D509 Iron deficiency anemia, unspecified: Secondary | ICD-10-CM | POA: Diagnosis not present

## 2016-12-12 DIAGNOSIS — R404 Transient alteration of awareness: Secondary | ICD-10-CM | POA: Diagnosis not present

## 2016-12-12 DIAGNOSIS — R5381 Other malaise: Secondary | ICD-10-CM | POA: Diagnosis not present

## 2016-12-14 DIAGNOSIS — M810 Age-related osteoporosis without current pathological fracture: Secondary | ICD-10-CM | POA: Diagnosis not present

## 2016-12-14 DIAGNOSIS — H548 Legal blindness, as defined in USA: Secondary | ICD-10-CM | POA: Diagnosis not present

## 2016-12-14 DIAGNOSIS — D509 Iron deficiency anemia, unspecified: Secondary | ICD-10-CM | POA: Diagnosis not present

## 2016-12-14 DIAGNOSIS — Z7984 Long term (current) use of oral hypoglycemic drugs: Secondary | ICD-10-CM | POA: Diagnosis not present

## 2016-12-14 DIAGNOSIS — E119 Type 2 diabetes mellitus without complications: Secondary | ICD-10-CM | POA: Diagnosis not present

## 2016-12-14 DIAGNOSIS — Z7901 Long term (current) use of anticoagulants: Secondary | ICD-10-CM | POA: Diagnosis not present

## 2016-12-14 DIAGNOSIS — M170 Bilateral primary osteoarthritis of knee: Secondary | ICD-10-CM | POA: Diagnosis not present

## 2016-12-14 DIAGNOSIS — G629 Polyneuropathy, unspecified: Secondary | ICD-10-CM | POA: Diagnosis not present

## 2016-12-18 DIAGNOSIS — D509 Iron deficiency anemia, unspecified: Secondary | ICD-10-CM | POA: Diagnosis not present

## 2016-12-18 DIAGNOSIS — M170 Bilateral primary osteoarthritis of knee: Secondary | ICD-10-CM | POA: Diagnosis not present

## 2016-12-18 DIAGNOSIS — H548 Legal blindness, as defined in USA: Secondary | ICD-10-CM | POA: Diagnosis not present

## 2016-12-18 DIAGNOSIS — G629 Polyneuropathy, unspecified: Secondary | ICD-10-CM | POA: Diagnosis not present

## 2016-12-18 DIAGNOSIS — M810 Age-related osteoporosis without current pathological fracture: Secondary | ICD-10-CM | POA: Diagnosis not present

## 2016-12-18 DIAGNOSIS — E119 Type 2 diabetes mellitus without complications: Secondary | ICD-10-CM | POA: Diagnosis not present

## 2016-12-19 DIAGNOSIS — G629 Polyneuropathy, unspecified: Secondary | ICD-10-CM | POA: Diagnosis not present

## 2016-12-19 DIAGNOSIS — D509 Iron deficiency anemia, unspecified: Secondary | ICD-10-CM | POA: Diagnosis not present

## 2016-12-19 DIAGNOSIS — H548 Legal blindness, as defined in USA: Secondary | ICD-10-CM | POA: Diagnosis not present

## 2016-12-19 DIAGNOSIS — E119 Type 2 diabetes mellitus without complications: Secondary | ICD-10-CM | POA: Diagnosis not present

## 2016-12-19 DIAGNOSIS — M810 Age-related osteoporosis without current pathological fracture: Secondary | ICD-10-CM | POA: Diagnosis not present

## 2016-12-19 DIAGNOSIS — M170 Bilateral primary osteoarthritis of knee: Secondary | ICD-10-CM | POA: Diagnosis not present

## 2016-12-20 DIAGNOSIS — H18893 Other specified disorders of cornea, bilateral: Secondary | ICD-10-CM | POA: Diagnosis not present

## 2016-12-20 DIAGNOSIS — M3500 Sicca syndrome, unspecified: Secondary | ICD-10-CM | POA: Diagnosis not present

## 2016-12-20 DIAGNOSIS — M3501 Sicca syndrome with keratoconjunctivitis: Secondary | ICD-10-CM | POA: Diagnosis not present

## 2016-12-20 DIAGNOSIS — H401133 Primary open-angle glaucoma, bilateral, severe stage: Secondary | ICD-10-CM | POA: Diagnosis not present

## 2016-12-24 DIAGNOSIS — E119 Type 2 diabetes mellitus without complications: Secondary | ICD-10-CM | POA: Diagnosis not present

## 2016-12-24 DIAGNOSIS — D509 Iron deficiency anemia, unspecified: Secondary | ICD-10-CM | POA: Diagnosis not present

## 2016-12-24 DIAGNOSIS — M810 Age-related osteoporosis without current pathological fracture: Secondary | ICD-10-CM | POA: Diagnosis not present

## 2016-12-24 DIAGNOSIS — G629 Polyneuropathy, unspecified: Secondary | ICD-10-CM | POA: Diagnosis not present

## 2016-12-24 DIAGNOSIS — M170 Bilateral primary osteoarthritis of knee: Secondary | ICD-10-CM | POA: Diagnosis not present

## 2016-12-24 DIAGNOSIS — H548 Legal blindness, as defined in USA: Secondary | ICD-10-CM | POA: Diagnosis not present

## 2016-12-27 DIAGNOSIS — G629 Polyneuropathy, unspecified: Secondary | ICD-10-CM | POA: Diagnosis not present

## 2016-12-27 DIAGNOSIS — H548 Legal blindness, as defined in USA: Secondary | ICD-10-CM | POA: Diagnosis not present

## 2016-12-27 DIAGNOSIS — E119 Type 2 diabetes mellitus without complications: Secondary | ICD-10-CM | POA: Diagnosis not present

## 2016-12-27 DIAGNOSIS — M170 Bilateral primary osteoarthritis of knee: Secondary | ICD-10-CM | POA: Diagnosis not present

## 2016-12-27 DIAGNOSIS — D509 Iron deficiency anemia, unspecified: Secondary | ICD-10-CM | POA: Diagnosis not present

## 2016-12-27 DIAGNOSIS — M810 Age-related osteoporosis without current pathological fracture: Secondary | ICD-10-CM | POA: Diagnosis not present

## 2016-12-31 ENCOUNTER — Telehealth: Payer: Self-pay | Admitting: Neurology

## 2016-12-31 NOTE — Telephone Encounter (Signed)
Note faxed.

## 2016-12-31 NOTE — Telephone Encounter (Signed)
Debbie with [facility] called and needs office notes from 11/05/2016 visit for pt faxed to her CB# [phone] Fax# [phone]

## 2017-01-01 DIAGNOSIS — H548 Legal blindness, as defined in USA: Secondary | ICD-10-CM | POA: Diagnosis not present

## 2017-01-01 DIAGNOSIS — D509 Iron deficiency anemia, unspecified: Secondary | ICD-10-CM | POA: Diagnosis not present

## 2017-01-01 DIAGNOSIS — M170 Bilateral primary osteoarthritis of knee: Secondary | ICD-10-CM | POA: Diagnosis not present

## 2017-01-01 DIAGNOSIS — M810 Age-related osteoporosis without current pathological fracture: Secondary | ICD-10-CM | POA: Diagnosis not present

## 2017-01-01 DIAGNOSIS — E119 Type 2 diabetes mellitus without complications: Secondary | ICD-10-CM | POA: Diagnosis not present

## 2017-01-01 DIAGNOSIS — G629 Polyneuropathy, unspecified: Secondary | ICD-10-CM | POA: Diagnosis not present

## 2017-01-03 DIAGNOSIS — M170 Bilateral primary osteoarthritis of knee: Secondary | ICD-10-CM | POA: Diagnosis not present

## 2017-01-03 DIAGNOSIS — M810 Age-related osteoporosis without current pathological fracture: Secondary | ICD-10-CM | POA: Diagnosis not present

## 2017-01-03 DIAGNOSIS — H548 Legal blindness, as defined in USA: Secondary | ICD-10-CM | POA: Diagnosis not present

## 2017-01-03 DIAGNOSIS — E119 Type 2 diabetes mellitus without complications: Secondary | ICD-10-CM | POA: Diagnosis not present

## 2017-01-03 DIAGNOSIS — D509 Iron deficiency anemia, unspecified: Secondary | ICD-10-CM | POA: Diagnosis not present

## 2017-01-03 DIAGNOSIS — G629 Polyneuropathy, unspecified: Secondary | ICD-10-CM | POA: Diagnosis not present

## 2017-01-08 DIAGNOSIS — M170 Bilateral primary osteoarthritis of knee: Secondary | ICD-10-CM | POA: Diagnosis not present

## 2017-01-08 DIAGNOSIS — D509 Iron deficiency anemia, unspecified: Secondary | ICD-10-CM | POA: Diagnosis not present

## 2017-01-08 DIAGNOSIS — G629 Polyneuropathy, unspecified: Secondary | ICD-10-CM | POA: Diagnosis not present

## 2017-01-08 DIAGNOSIS — H548 Legal blindness, as defined in USA: Secondary | ICD-10-CM | POA: Diagnosis not present

## 2017-01-08 DIAGNOSIS — M810 Age-related osteoporosis without current pathological fracture: Secondary | ICD-10-CM | POA: Diagnosis not present

## 2017-01-08 DIAGNOSIS — E119 Type 2 diabetes mellitus without complications: Secondary | ICD-10-CM | POA: Diagnosis not present

## 2017-01-09 DIAGNOSIS — E119 Type 2 diabetes mellitus without complications: Secondary | ICD-10-CM | POA: Diagnosis not present

## 2017-01-09 DIAGNOSIS — H548 Legal blindness, as defined in USA: Secondary | ICD-10-CM | POA: Diagnosis not present

## 2017-01-09 DIAGNOSIS — D509 Iron deficiency anemia, unspecified: Secondary | ICD-10-CM | POA: Diagnosis not present

## 2017-01-09 DIAGNOSIS — G629 Polyneuropathy, unspecified: Secondary | ICD-10-CM | POA: Diagnosis not present

## 2017-01-09 DIAGNOSIS — M810 Age-related osteoporosis without current pathological fracture: Secondary | ICD-10-CM | POA: Diagnosis not present

## 2017-01-09 DIAGNOSIS — M170 Bilateral primary osteoarthritis of knee: Secondary | ICD-10-CM | POA: Diagnosis not present

## 2017-01-13 DIAGNOSIS — H548 Legal blindness, as defined in USA: Secondary | ICD-10-CM | POA: Diagnosis not present

## 2017-01-13 DIAGNOSIS — E119 Type 2 diabetes mellitus without complications: Secondary | ICD-10-CM | POA: Diagnosis not present

## 2017-01-13 DIAGNOSIS — M810 Age-related osteoporosis without current pathological fracture: Secondary | ICD-10-CM | POA: Diagnosis not present

## 2017-01-13 DIAGNOSIS — D509 Iron deficiency anemia, unspecified: Secondary | ICD-10-CM | POA: Diagnosis not present

## 2017-01-13 DIAGNOSIS — J0100 Acute maxillary sinusitis, unspecified: Secondary | ICD-10-CM | POA: Diagnosis not present

## 2017-01-13 DIAGNOSIS — G629 Polyneuropathy, unspecified: Secondary | ICD-10-CM | POA: Diagnosis not present

## 2017-01-13 DIAGNOSIS — M170 Bilateral primary osteoarthritis of knee: Secondary | ICD-10-CM | POA: Diagnosis not present

## 2017-01-17 ENCOUNTER — Telehealth: Payer: Self-pay | Admitting: Neurology

## 2017-01-17 NOTE — Telephone Encounter (Signed)
Patient states that she was approved for the motorize wheelchair and has not heard anything and she wants to know the status of that. Please call her and let her know

## 2017-01-17 NOTE — Telephone Encounter (Signed)
Called Andria Rhein and she is looking into the status of the wheelchair.

## 2017-01-20 DIAGNOSIS — D509 Iron deficiency anemia, unspecified: Secondary | ICD-10-CM | POA: Diagnosis not present

## 2017-01-20 DIAGNOSIS — H548 Legal blindness, as defined in USA: Secondary | ICD-10-CM | POA: Diagnosis not present

## 2017-01-20 DIAGNOSIS — E119 Type 2 diabetes mellitus without complications: Secondary | ICD-10-CM | POA: Diagnosis not present

## 2017-01-20 DIAGNOSIS — M170 Bilateral primary osteoarthritis of knee: Secondary | ICD-10-CM | POA: Diagnosis not present

## 2017-01-20 DIAGNOSIS — M810 Age-related osteoporosis without current pathological fracture: Secondary | ICD-10-CM | POA: Diagnosis not present

## 2017-01-20 DIAGNOSIS — G629 Polyneuropathy, unspecified: Secondary | ICD-10-CM | POA: Diagnosis not present

## 2017-01-23 DIAGNOSIS — R5382 Chronic fatigue, unspecified: Secondary | ICD-10-CM | POA: Diagnosis not present

## 2017-01-23 DIAGNOSIS — E538 Deficiency of other specified B group vitamins: Secondary | ICD-10-CM | POA: Diagnosis not present

## 2017-01-23 DIAGNOSIS — E1143 Type 2 diabetes mellitus with diabetic autonomic (poly)neuropathy: Secondary | ICD-10-CM | POA: Diagnosis not present

## 2017-01-23 DIAGNOSIS — E871 Hypo-osmolality and hyponatremia: Secondary | ICD-10-CM | POA: Diagnosis not present

## 2017-01-23 DIAGNOSIS — E559 Vitamin D deficiency, unspecified: Secondary | ICD-10-CM | POA: Diagnosis not present

## 2017-01-23 DIAGNOSIS — D509 Iron deficiency anemia, unspecified: Secondary | ICD-10-CM | POA: Diagnosis not present

## 2017-01-25 ENCOUNTER — Other Ambulatory Visit: Payer: Self-pay | Admitting: Neurology

## 2017-01-27 ENCOUNTER — Other Ambulatory Visit: Payer: Self-pay | Admitting: *Deleted

## 2017-01-27 DIAGNOSIS — E119 Type 2 diabetes mellitus without complications: Secondary | ICD-10-CM | POA: Diagnosis not present

## 2017-01-27 DIAGNOSIS — M170 Bilateral primary osteoarthritis of knee: Secondary | ICD-10-CM | POA: Diagnosis not present

## 2017-01-27 DIAGNOSIS — M810 Age-related osteoporosis without current pathological fracture: Secondary | ICD-10-CM | POA: Diagnosis not present

## 2017-01-27 DIAGNOSIS — G629 Polyneuropathy, unspecified: Secondary | ICD-10-CM | POA: Diagnosis not present

## 2017-01-27 DIAGNOSIS — H548 Legal blindness, as defined in USA: Secondary | ICD-10-CM | POA: Diagnosis not present

## 2017-01-27 DIAGNOSIS — D509 Iron deficiency anemia, unspecified: Secondary | ICD-10-CM | POA: Diagnosis not present

## 2017-01-27 MED ORDER — TIZANIDINE HCL 2 MG PO CAPS: 2.0000 mg | ORAL_CAPSULE | Freq: Three times a day (TID) | ORAL | 11 refills | Status: DC

## 2017-02-04 DIAGNOSIS — G629 Polyneuropathy, unspecified: Secondary | ICD-10-CM | POA: Diagnosis not present

## 2017-02-04 DIAGNOSIS — H548 Legal blindness, as defined in USA: Secondary | ICD-10-CM | POA: Diagnosis not present

## 2017-02-04 DIAGNOSIS — E119 Type 2 diabetes mellitus without complications: Secondary | ICD-10-CM | POA: Diagnosis not present

## 2017-02-04 DIAGNOSIS — D509 Iron deficiency anemia, unspecified: Secondary | ICD-10-CM | POA: Diagnosis not present

## 2017-02-04 DIAGNOSIS — M810 Age-related osteoporosis without current pathological fracture: Secondary | ICD-10-CM | POA: Diagnosis not present

## 2017-02-04 DIAGNOSIS — M170 Bilateral primary osteoarthritis of knee: Secondary | ICD-10-CM | POA: Diagnosis not present

## 2017-02-10 DIAGNOSIS — D509 Iron deficiency anemia, unspecified: Secondary | ICD-10-CM | POA: Diagnosis not present

## 2017-02-10 DIAGNOSIS — G629 Polyneuropathy, unspecified: Secondary | ICD-10-CM | POA: Diagnosis not present

## 2017-02-10 DIAGNOSIS — E119 Type 2 diabetes mellitus without complications: Secondary | ICD-10-CM | POA: Diagnosis not present

## 2017-02-10 DIAGNOSIS — M170 Bilateral primary osteoarthritis of knee: Secondary | ICD-10-CM | POA: Diagnosis not present

## 2017-02-10 DIAGNOSIS — M810 Age-related osteoporosis without current pathological fracture: Secondary | ICD-10-CM | POA: Diagnosis not present

## 2017-02-10 DIAGNOSIS — H548 Legal blindness, as defined in USA: Secondary | ICD-10-CM | POA: Diagnosis not present

## 2017-02-12 DIAGNOSIS — Z7984 Long term (current) use of oral hypoglycemic drugs: Secondary | ICD-10-CM | POA: Diagnosis not present

## 2017-02-12 DIAGNOSIS — D509 Iron deficiency anemia, unspecified: Secondary | ICD-10-CM | POA: Diagnosis not present

## 2017-02-12 DIAGNOSIS — M170 Bilateral primary osteoarthritis of knee: Secondary | ICD-10-CM | POA: Diagnosis not present

## 2017-02-12 DIAGNOSIS — G629 Polyneuropathy, unspecified: Secondary | ICD-10-CM | POA: Diagnosis not present

## 2017-02-12 DIAGNOSIS — Z7901 Long term (current) use of anticoagulants: Secondary | ICD-10-CM | POA: Diagnosis not present

## 2017-02-12 DIAGNOSIS — M810 Age-related osteoporosis without current pathological fracture: Secondary | ICD-10-CM | POA: Diagnosis not present

## 2017-02-12 DIAGNOSIS — E119 Type 2 diabetes mellitus without complications: Secondary | ICD-10-CM | POA: Diagnosis not present

## 2017-02-12 DIAGNOSIS — H548 Legal blindness, as defined in USA: Secondary | ICD-10-CM | POA: Diagnosis not present

## 2017-02-14 ENCOUNTER — Ambulatory Visit (INDEPENDENT_AMBULATORY_CARE_PROVIDER_SITE_OTHER): Payer: Medicare Other | Admitting: Neurology

## 2017-02-14 ENCOUNTER — Encounter: Payer: Self-pay | Admitting: Neurology

## 2017-02-14 VITALS — BP 142/76 | HR 73 | Ht 60.0 in | Wt 170.0 lb

## 2017-02-14 DIAGNOSIS — I639 Cerebral infarction, unspecified: Secondary | ICD-10-CM | POA: Diagnosis not present

## 2017-02-14 DIAGNOSIS — R269 Unspecified abnormalities of gait and mobility: Secondary | ICD-10-CM

## 2017-02-14 DIAGNOSIS — G63 Polyneuropathy in diseases classified elsewhere: Secondary | ICD-10-CM | POA: Diagnosis not present

## 2017-02-14 DIAGNOSIS — G44221 Chronic tension-type headache, intractable: Secondary | ICD-10-CM

## 2017-02-14 DIAGNOSIS — M3509 Sicca syndrome with other organ involvement: Secondary | ICD-10-CM | POA: Diagnosis not present

## 2017-02-14 DIAGNOSIS — R251 Tremor, unspecified: Secondary | ICD-10-CM | POA: Diagnosis not present

## 2017-02-14 DIAGNOSIS — M3506 Sjogren syndrome with peripheral nervous system involvement: Secondary | ICD-10-CM

## 2017-02-14 NOTE — Progress Notes (Signed)
Follow-up Visit   Date: 02/14/17    Krystal Clarke MRN: [id number] DOB: December 03, 1935   [name] is a 80 y.o. right-handed Caucasian female with hypertension, Sjogren's syndrome (diagnosed 4401) complicated by neuropathy and vision impairment, migraines, atrial fibrillation (on apixiban), history of shingles affecting left T6 (07/2009) and stroke (September 2014, residual word-finding difficulty) returning to the clinic for follow-up visit for neuropathy.   History of present illness: She was under the care of Dr. Erling Cruz at [facility] for a over 30 years for migraines, stroke, peripheral neuropathy, headaches, and gait instability. She has a long history of migraines or started at the age of 79. Her headaches were well controlled on Depakote 250 twice a day. However, it was stopped due to low platelets and anticoagulation. Previously tried medication for headaches is depakote, Botox (2 trials without benefit) and maxalt. Being on apixiban, she is unable to take NSAIDs due to bleeding risk.  She was given a prednisone taper dose with dramatic improvement of headaches and has been relatively well since then.   She was admitted to the hospital in September 2014 for community-acquired pneumonia and found to have new onset atrial fibrillation. Due to worsening headaches, MRI of the brain was obtained in October which showed a small left thalamic subacute stroke.  In 2015, she was started on Lyrica 150-300mg  for headaches and neuropathy and responded well.    In 2017, she was having sneezing spells with shaking of the arms.  This was evaluated with EEG which was normal and eventually became less frequent.  She was hospitalized in November 2018 with colovesical fistula s/p robotic colectomy & bladder closure 02/07/2016.  - UPDATE 09/16/2016:  She is here for 6 month appointment.  She no longer has stabbing or stinging pain in her neck, but has low dull headache.  Her painful neuropathy  is well-controlled on Lyrica 150-300mg . She is having greater difficulty with walking and is interested in getting an electric scooter as her husband cannot push a wheelchair. She is very bothered by the increased frequency of sneezing that occurs in conjunction with right arm shaking, which occurs daily and more intense than in the past. She is awake during this time and there is no LOC.  Over the past three weeks, she also feels as if she may have had a minor stroke because her confusion and word-finding difficulty is worse. No lateralizing weakness of paresthesias. She remains on Eliquis for atrial fibrillation and had some palpitations a few weeks ago.   UPDATE 02/14/2017: Today, she continues to complains of frequent and vigourous sneezing spells with right arm tremor, now occurring daily.  There is never any loss of consciousness.  Her Lyrica was reduced to 150mg  BID by her PCP and she has been tolerating this well - no worsening migraines or neuropathy. She complains of sharp pain at the base of head on the right which was controlled with tizanidine, but now occurs about 2-4 times per week. Husband says that she stays very tired and sleepy during the day.  Her last visit in August was for motorized power chair assessment, but she has not heard anything about whether this was approved or not.   Medications:  Current Outpatient Medications on File Prior to Visit  Medication Sig Dispense Refill  . cyclobenzaprine (FLEXERIL) 5 MG tablet TAKE 1 TABLET BY MOUTH AT BEDTIME 90 tablet 3  . diltiazem (CARDIZEM CD) 180 MG 24 hr capsule TAKE 1 CAPSULE (180 MG  TOTAL) BY MOUTH DAILY. 90 capsule 3  . dorzolamide (TRUSOPT) 2 % ophthalmic solution Place 1 drop into both eyes 2 (two) times daily.    . dorzolamide-timolol (COSOPT) 22.3-6.8 MG/ML ophthalmic solution     . ELIQUIS 5 MG TABS tablet TAKE 1 TABLET (5 MG TOTAL) BY MOUTH 2 (TWO) TIMES DAILY. 180 tablet 1  . fenofibrate (TRICOR) 145 MG tablet Take 145 mg by  mouth at bedtime.   0  . FERREX 150 150 MG capsule     . metFORMIN (GLUCOPHAGE-XR) 500 MG 24 hr tablet Take 500 mg by mouth every evening.     Marland Kitchen omeprazole-sodium bicarbonate (ZEGERID) 40-1100 MG capsule Take 1 capsule by mouth daily before breakfast.    . ondansetron (ZOFRAN) 4 MG tablet Take 4 mg by mouth every 8 (eight) hours as needed for nausea or vomiting.    . pregabalin (LYRICA) 150 MG capsule Take 150-300 mg by mouth 2 (two) times daily. Pt takes one capsule every morning and two at bedtime.    . timolol (BETIMOL) 0.5 % ophthalmic solution Place 1 drop into both eyes 2 (two) times daily.     . tizanidine (ZANAFLEX) 2 MG capsule Take 1 capsule (2 mg total) 3 (three) times daily by mouth. 90 capsule 11  . traMADol (ULTRAM) 50 MG tablet Take 1-2 tablets (50-100 mg total) by mouth every 6 (six) hours as needed for moderate pain or severe pain. 30 tablet 0  . traZODone (DESYREL) 50 MG tablet Take 25-50 mg by mouth at bedtime as needed for sleep.    Marland Kitchen triamcinolone cream (KENALOG) 0.1 %      No current facility-administered medications on file prior to visit.     Allergies:  Allergies  Allergen Reactions  . Oxycodone-Aspirin Nausea And Vomiting  . Codeine Nausea And Vomiting  . Hydrocodone Nausea And Vomiting  . Morphine And Related Nausea And Vomiting  . Oxycodone Nausea And Vomiting  . Pilocarpine Other (See Comments)    Reaction:  Unknown   . Rivaroxaban Other (See Comments)    Reaction:  Back pain/headaches      Review of Systems:  CONSTITUTIONAL: No fevers, chills, night sweats, no weight loss.   EYES: +visual changes or eye pain ENT: No hearing changes.  No history of nose bleeds.   RESPIRATORY: No cough, wheezing and shortness of breath.   CARDIOVASCULAR: Negative for chest pain, and palpitations.   GI: No abdominal discomfort, blood in stools or black stools.  No recent change in bowel habits.   GU:  No history of incontinence.  MUSCLOSKELETAL: +history of joint pain  or swelling.  No myalgias.   SKIN: No for lesions, rash, and itching.   ENDOCRINE: Negative for cold or heat intolerance, polydipsia or goiter.   PSYCH:  No depression or anxiety symptoms.   NEURO: As Above.   Vital Signs:  BP (!) 142/76   Pulse 73   Ht 5' (1.524 m)   Wt 170 lb (77.1 kg)   SpO2 96%   BMI 33.20 kg/m   Neurological Exam: MENTAL STATUS including orientation to time, place, person, recent and remote memory, attention span and concentration, language, and fund of knowledge is fair.   Speech is not dysarthric.    CRANIAL NERVES:  Severe visual impairment bilaterally, she is only able to see colors. Irregular pupils, but reactive to light.  Mild bilateral ptosis. Face is symmetric. Dry oral mucosa and tongue.   MOTOR: Motor strength is 5-/5 proximally.  L  dorsiflexion and toe extension is 4/5.  Tone is normal.    SENSATION:  Vibration intact in the upper extremities and absent distal to ankles bilaterally.    COORDINATION/GAIT: Gait is moderately wide-based with small, short steps assisted with walker.   Data: MRI brain wo contrast 12/23/2012: Very small acute/ subacute left thalamic nonhemorrhagic infarct. Prominent small vessel disease type changes. No intracranial hemorrhage. Mild atrophy without hydrocephalus.  Prior clinic notes from Dr. Rexene Alberts, previous neurological workup includes:  EEG 06/03/1995, 08/31/2008, 05/25/2015: normal  CSF 1991: normal  Lab Results  Component Value Date   VITAMINB12 260 04/12/2016   Lab Results  Component Value Date   TSH 0.834 12/06/2012    IMPRESSION/PLAN:   1. Mixed large and small fiber sensorimotor peripheral neuropathy due to Sjogren's disease, stable  - Numbness involving glove-stocking distribution as a progression of the disease  - Pain is controlled with Lyrica 150mg  BID (lowered from 150-300)  2. Migraine without aura, chronic daily headaches - stable  - Previously used:  VPA (stopped due to low PLT), triptans (stroke),  flexeril (no improvement), botox  - She had significant benefit with trial of medrol dose pak  3.  Tension headaches - worsening  - Reduce tizandine 2mg  at bedtime due to sedation and take as needed for sharp pain  4.  Spells of sneezing with associated abnormal hand movements of the right arm - increased frequency now occurring daily.  - These are not suggeestive seizures based on history.  EEG and US carotids nondiagnostic  - Repeat EEG due to increased frequency of spells, although overall suspicion for seizure is very low  - Ambulatory EEG to capture spells.    5. Multifactorial gait instability with generalized weakness, uses 4-wheeled rollator.   - She had motorized power chair evaluation and has not had heard whether this is approved, we will follow-up on this.    6. Left thalamic stroke, likely cardioembolic due to afib on apixiban  7. Age-related memory loss possibly transitioning into vascular dementia  - Neuropsychological testing 05/25/2010: Decreased verbal fluency, otherwise normal exam.  Return to clinic in 6 months  Greater than 50% of this 35 minute visit was spent in counseling, explanation of diagnosis, planning of further management, and coordination of care.    Thank you for allowing me to participate in patient's care.  If I can answer any additional questions, I would be pleased to do so.    Sincerely,    Mattix Imhof K. Posey Pronto, DO

## 2017-02-14 NOTE — Patient Instructions (Addendum)
Reduce tizandine 2mg  at bedtime and take as needed for sharp pain  Ambulatory EEG  We will follow-up with your motorized chair status

## 2017-02-17 DIAGNOSIS — M170 Bilateral primary osteoarthritis of knee: Secondary | ICD-10-CM | POA: Diagnosis not present

## 2017-02-17 DIAGNOSIS — E119 Type 2 diabetes mellitus without complications: Secondary | ICD-10-CM | POA: Diagnosis not present

## 2017-02-17 DIAGNOSIS — D509 Iron deficiency anemia, unspecified: Secondary | ICD-10-CM | POA: Diagnosis not present

## 2017-02-17 DIAGNOSIS — H548 Legal blindness, as defined in USA: Secondary | ICD-10-CM | POA: Diagnosis not present

## 2017-02-17 DIAGNOSIS — G629 Polyneuropathy, unspecified: Secondary | ICD-10-CM | POA: Diagnosis not present

## 2017-02-17 DIAGNOSIS — M810 Age-related osteoporosis without current pathological fracture: Secondary | ICD-10-CM | POA: Diagnosis not present

## 2017-02-19 DIAGNOSIS — M810 Age-related osteoporosis without current pathological fracture: Secondary | ICD-10-CM | POA: Diagnosis not present

## 2017-02-19 DIAGNOSIS — H548 Legal blindness, as defined in USA: Secondary | ICD-10-CM | POA: Diagnosis not present

## 2017-02-19 DIAGNOSIS — D509 Iron deficiency anemia, unspecified: Secondary | ICD-10-CM | POA: Diagnosis not present

## 2017-02-19 DIAGNOSIS — G629 Polyneuropathy, unspecified: Secondary | ICD-10-CM | POA: Diagnosis not present

## 2017-02-19 DIAGNOSIS — M170 Bilateral primary osteoarthritis of knee: Secondary | ICD-10-CM | POA: Diagnosis not present

## 2017-02-19 DIAGNOSIS — E119 Type 2 diabetes mellitus without complications: Secondary | ICD-10-CM | POA: Diagnosis not present

## 2017-02-24 ENCOUNTER — Other Ambulatory Visit: Payer: Medicare Other

## 2017-02-25 DIAGNOSIS — H548 Legal blindness, as defined in USA: Secondary | ICD-10-CM | POA: Diagnosis not present

## 2017-02-25 DIAGNOSIS — D509 Iron deficiency anemia, unspecified: Secondary | ICD-10-CM | POA: Diagnosis not present

## 2017-02-25 DIAGNOSIS — G629 Polyneuropathy, unspecified: Secondary | ICD-10-CM | POA: Diagnosis not present

## 2017-02-25 DIAGNOSIS — E119 Type 2 diabetes mellitus without complications: Secondary | ICD-10-CM | POA: Diagnosis not present

## 2017-02-25 DIAGNOSIS — M810 Age-related osteoporosis without current pathological fracture: Secondary | ICD-10-CM | POA: Diagnosis not present

## 2017-02-25 DIAGNOSIS — M170 Bilateral primary osteoarthritis of knee: Secondary | ICD-10-CM | POA: Diagnosis not present

## 2017-02-26 ENCOUNTER — Other Ambulatory Visit: Payer: Medicare Other

## 2017-02-27 DIAGNOSIS — H548 Legal blindness, as defined in USA: Secondary | ICD-10-CM | POA: Diagnosis not present

## 2017-02-27 DIAGNOSIS — I480 Paroxysmal atrial fibrillation: Secondary | ICD-10-CM | POA: Diagnosis not present

## 2017-02-27 DIAGNOSIS — M170 Bilateral primary osteoarthritis of knee: Secondary | ICD-10-CM | POA: Diagnosis not present

## 2017-02-27 DIAGNOSIS — E119 Type 2 diabetes mellitus without complications: Secondary | ICD-10-CM | POA: Diagnosis not present

## 2017-02-27 DIAGNOSIS — E1143 Type 2 diabetes mellitus with diabetic autonomic (poly)neuropathy: Secondary | ICD-10-CM | POA: Diagnosis not present

## 2017-02-27 DIAGNOSIS — M47816 Spondylosis without myelopathy or radiculopathy, lumbar region: Secondary | ICD-10-CM | POA: Diagnosis not present

## 2017-02-27 DIAGNOSIS — I4891 Unspecified atrial fibrillation: Secondary | ICD-10-CM | POA: Diagnosis not present

## 2017-02-27 DIAGNOSIS — M810 Age-related osteoporosis without current pathological fracture: Secondary | ICD-10-CM | POA: Diagnosis not present

## 2017-02-27 DIAGNOSIS — D509 Iron deficiency anemia, unspecified: Secondary | ICD-10-CM | POA: Diagnosis not present

## 2017-02-27 DIAGNOSIS — G629 Polyneuropathy, unspecified: Secondary | ICD-10-CM | POA: Diagnosis not present

## 2017-02-27 DIAGNOSIS — D500 Iron deficiency anemia secondary to blood loss (chronic): Secondary | ICD-10-CM | POA: Diagnosis not present

## 2017-02-27 DIAGNOSIS — E785 Hyperlipidemia, unspecified: Secondary | ICD-10-CM | POA: Diagnosis not present

## 2017-03-03 DIAGNOSIS — M810 Age-related osteoporosis without current pathological fracture: Secondary | ICD-10-CM | POA: Diagnosis not present

## 2017-03-03 DIAGNOSIS — E119 Type 2 diabetes mellitus without complications: Secondary | ICD-10-CM | POA: Diagnosis not present

## 2017-03-03 DIAGNOSIS — M170 Bilateral primary osteoarthritis of knee: Secondary | ICD-10-CM | POA: Diagnosis not present

## 2017-03-03 DIAGNOSIS — G629 Polyneuropathy, unspecified: Secondary | ICD-10-CM | POA: Diagnosis not present

## 2017-03-03 DIAGNOSIS — H548 Legal blindness, as defined in USA: Secondary | ICD-10-CM | POA: Diagnosis not present

## 2017-03-03 DIAGNOSIS — D509 Iron deficiency anemia, unspecified: Secondary | ICD-10-CM | POA: Diagnosis not present

## 2017-03-04 ENCOUNTER — Other Ambulatory Visit: Payer: Medicare Other | Admitting: Neurology

## 2017-03-05 DIAGNOSIS — E119 Type 2 diabetes mellitus without complications: Secondary | ICD-10-CM | POA: Diagnosis not present

## 2017-03-05 DIAGNOSIS — H548 Legal blindness, as defined in USA: Secondary | ICD-10-CM | POA: Diagnosis not present

## 2017-03-05 DIAGNOSIS — D509 Iron deficiency anemia, unspecified: Secondary | ICD-10-CM | POA: Diagnosis not present

## 2017-03-05 DIAGNOSIS — M810 Age-related osteoporosis without current pathological fracture: Secondary | ICD-10-CM | POA: Diagnosis not present

## 2017-03-05 DIAGNOSIS — G629 Polyneuropathy, unspecified: Secondary | ICD-10-CM | POA: Diagnosis not present

## 2017-03-05 DIAGNOSIS — M170 Bilateral primary osteoarthritis of knee: Secondary | ICD-10-CM | POA: Diagnosis not present

## 2017-03-06 ENCOUNTER — Other Ambulatory Visit: Payer: Self-pay | Admitting: Cardiology

## 2017-03-06 DIAGNOSIS — I4891 Unspecified atrial fibrillation: Secondary | ICD-10-CM

## 2017-03-07 DIAGNOSIS — M1711 Unilateral primary osteoarthritis, right knee: Secondary | ICD-10-CM | POA: Diagnosis not present

## 2017-03-12 DIAGNOSIS — Z8719 Personal history of other diseases of the digestive system: Secondary | ICD-10-CM | POA: Insufficient documentation

## 2017-03-12 DIAGNOSIS — T6701XA Heatstroke and sunstroke, initial encounter: Secondary | ICD-10-CM | POA: Insufficient documentation

## 2017-03-12 DIAGNOSIS — I639 Cerebral infarction, unspecified: Secondary | ICD-10-CM | POA: Insufficient documentation

## 2017-03-12 DIAGNOSIS — H5440 Blindness, one eye, unspecified eye: Secondary | ICD-10-CM | POA: Insufficient documentation

## 2017-03-12 DIAGNOSIS — H548 Legal blindness, as defined in USA: Secondary | ICD-10-CM | POA: Insufficient documentation

## 2017-03-12 DIAGNOSIS — G629 Polyneuropathy, unspecified: Secondary | ICD-10-CM | POA: Insufficient documentation

## 2017-03-12 DIAGNOSIS — M810 Age-related osteoporosis without current pathological fracture: Secondary | ICD-10-CM | POA: Insufficient documentation

## 2017-03-12 DIAGNOSIS — K219 Gastro-esophageal reflux disease without esophagitis: Secondary | ICD-10-CM | POA: Insufficient documentation

## 2017-03-12 DIAGNOSIS — R112 Nausea with vomiting, unspecified: Secondary | ICD-10-CM | POA: Insufficient documentation

## 2017-03-12 DIAGNOSIS — G43109 Migraine with aura, not intractable, without status migrainosus: Secondary | ICD-10-CM | POA: Insufficient documentation

## 2017-03-12 DIAGNOSIS — T8859XA Other complications of anesthesia, initial encounter: Secondary | ICD-10-CM | POA: Insufficient documentation

## 2017-03-12 DIAGNOSIS — I10 Essential (primary) hypertension: Secondary | ICD-10-CM | POA: Insufficient documentation

## 2017-03-12 DIAGNOSIS — Z9889 Other specified postprocedural states: Secondary | ICD-10-CM

## 2017-03-12 DIAGNOSIS — R7302 Impaired glucose tolerance (oral): Secondary | ICD-10-CM | POA: Insufficient documentation

## 2017-03-12 DIAGNOSIS — T7840XA Allergy, unspecified, initial encounter: Secondary | ICD-10-CM | POA: Insufficient documentation

## 2017-03-12 DIAGNOSIS — H409 Unspecified glaucoma: Secondary | ICD-10-CM | POA: Insufficient documentation

## 2017-03-12 DIAGNOSIS — T4145XA Adverse effect of unspecified anesthetic, initial encounter: Secondary | ICD-10-CM | POA: Insufficient documentation

## 2017-03-12 DIAGNOSIS — J189 Pneumonia, unspecified organism: Secondary | ICD-10-CM | POA: Insufficient documentation

## 2017-03-12 DIAGNOSIS — G43909 Migraine, unspecified, not intractable, without status migrainosus: Secondary | ICD-10-CM | POA: Insufficient documentation

## 2017-03-12 DIAGNOSIS — R569 Unspecified convulsions: Secondary | ICD-10-CM

## 2017-03-13 DIAGNOSIS — G629 Polyneuropathy, unspecified: Secondary | ICD-10-CM | POA: Diagnosis not present

## 2017-03-13 DIAGNOSIS — H548 Legal blindness, as defined in USA: Secondary | ICD-10-CM | POA: Diagnosis not present

## 2017-03-13 DIAGNOSIS — M810 Age-related osteoporosis without current pathological fracture: Secondary | ICD-10-CM | POA: Diagnosis not present

## 2017-03-13 DIAGNOSIS — M170 Bilateral primary osteoarthritis of knee: Secondary | ICD-10-CM | POA: Diagnosis not present

## 2017-03-13 DIAGNOSIS — E119 Type 2 diabetes mellitus without complications: Secondary | ICD-10-CM | POA: Diagnosis not present

## 2017-03-13 DIAGNOSIS — D509 Iron deficiency anemia, unspecified: Secondary | ICD-10-CM | POA: Diagnosis not present

## 2017-03-17 ENCOUNTER — Ambulatory Visit: Payer: Medicare Other | Admitting: Neurology

## 2017-03-17 DIAGNOSIS — M810 Age-related osteoporosis without current pathological fracture: Secondary | ICD-10-CM | POA: Diagnosis not present

## 2017-03-17 DIAGNOSIS — G629 Polyneuropathy, unspecified: Secondary | ICD-10-CM | POA: Diagnosis not present

## 2017-03-17 DIAGNOSIS — D509 Iron deficiency anemia, unspecified: Secondary | ICD-10-CM | POA: Diagnosis not present

## 2017-03-17 DIAGNOSIS — H548 Legal blindness, as defined in USA: Secondary | ICD-10-CM | POA: Diagnosis not present

## 2017-03-17 DIAGNOSIS — E119 Type 2 diabetes mellitus without complications: Secondary | ICD-10-CM | POA: Diagnosis not present

## 2017-03-17 DIAGNOSIS — M170 Bilateral primary osteoarthritis of knee: Secondary | ICD-10-CM | POA: Diagnosis not present

## 2017-03-19 DIAGNOSIS — H18893 Other specified disorders of cornea, bilateral: Secondary | ICD-10-CM | POA: Diagnosis not present

## 2017-03-19 DIAGNOSIS — Z961 Presence of intraocular lens: Secondary | ICD-10-CM | POA: Diagnosis not present

## 2017-03-19 DIAGNOSIS — H02403 Unspecified ptosis of bilateral eyelids: Secondary | ICD-10-CM | POA: Diagnosis not present

## 2017-03-19 DIAGNOSIS — H401133 Primary open-angle glaucoma, bilateral, severe stage: Secondary | ICD-10-CM | POA: Diagnosis not present

## 2017-03-19 DIAGNOSIS — M3501 Sicca syndrome with keratoconjunctivitis: Secondary | ICD-10-CM | POA: Diagnosis not present

## 2017-03-24 ENCOUNTER — Ambulatory Visit (INDEPENDENT_AMBULATORY_CARE_PROVIDER_SITE_OTHER): Payer: Medicare Other | Admitting: Neurology

## 2017-03-24 DIAGNOSIS — R251 Tremor, unspecified: Secondary | ICD-10-CM | POA: Diagnosis not present

## 2017-03-26 DIAGNOSIS — H548 Legal blindness, as defined in USA: Secondary | ICD-10-CM | POA: Diagnosis not present

## 2017-03-26 DIAGNOSIS — D509 Iron deficiency anemia, unspecified: Secondary | ICD-10-CM | POA: Diagnosis not present

## 2017-03-26 DIAGNOSIS — M810 Age-related osteoporosis without current pathological fracture: Secondary | ICD-10-CM | POA: Diagnosis not present

## 2017-03-26 DIAGNOSIS — G629 Polyneuropathy, unspecified: Secondary | ICD-10-CM | POA: Diagnosis not present

## 2017-03-26 DIAGNOSIS — E119 Type 2 diabetes mellitus without complications: Secondary | ICD-10-CM | POA: Diagnosis not present

## 2017-03-26 DIAGNOSIS — M170 Bilateral primary osteoarthritis of knee: Secondary | ICD-10-CM | POA: Diagnosis not present

## 2017-03-26 NOTE — Progress Notes (Signed)
HPI: FU atrial fibrillation. She has a hx of Sjogren's syndrome, seizure disorder, HTN, HL, peripheral neuropathy. She was admitted 9/14 after presenting with overall functional decline, cough and generalized weakness and malaise. She was found to be in atrial fibrillation with RVR. CHADS2-VASc=4. She was placed on Xarelto for anticoagulation. Echo (12/07/12): Moderate focal basal hypertrophy of the septum, vigorous LV function, EF 65-70%, indeterminate diastolic function, normal wall motion, trivial MR, moderate LAE, trivial TR. She converted to sinus rhythm. MRI in October of 2014 showed a very small acute/subacute left thalamic infarct. Patient found to be in recurrent atrial fibrillation 8/16. Nuclear study 8/16 showed EF 65 and no ischemia. Echo 8/16 showed normal LV function. Patient declined cardioversion previously and her amiodarone was discontinued. Holter monitor February 2017 showed atrial fibrillation with PVCs or aberrantly conducted beats rate controlled. Showed 1-39% bilateral stenosis. Since last seen,   Current Outpatient Medications  Medication Sig Dispense Refill  . cyclobenzaprine (FLEXERIL) 5 MG tablet TAKE 1 TABLET BY MOUTH AT BEDTIME 90 tablet 3  . diltiazem (CARDIZEM CD) 180 MG 24 hr capsule TAKE 1 CAPSULE (180 MG TOTAL) BY MOUTH DAILY. 90 capsule 3  . dorzolamide (TRUSOPT) 2 % ophthalmic solution Place 1 drop into both eyes 2 (two) times daily.    . dorzolamide-timolol (COSOPT) 22.3-6.8 MG/ML ophthalmic solution     . ELIQUIS 5 MG TABS tablet TAKE 1 TABLET (5 MG TOTAL) BY MOUTH 2 (TWO) TIMES DAILY. 180 tablet 1  . fenofibrate (TRICOR) 145 MG tablet Take 145 mg by mouth at bedtime.   0  . FERREX 150 150 MG capsule     . metFORMIN (GLUCOPHAGE-XR) 500 MG 24 hr tablet Take 500 mg by mouth every evening.     Marland Kitchen omeprazole-sodium bicarbonate (ZEGERID) 40-1100 MG capsule Take 1 capsule by mouth daily before breakfast.    . ondansetron (ZOFRAN) 4 MG tablet Take 4 mg by mouth  every 8 (eight) hours as needed for nausea or vomiting.    . pregabalin (LYRICA) 150 MG capsule Take 150-300 mg by mouth 2 (two) times daily. Pt takes one capsule every morning and two at bedtime.    . timolol (BETIMOL) 0.5 % ophthalmic solution Place 1 drop into both eyes 2 (two) times daily.     . tizanidine (ZANAFLEX) 2 MG capsule Take 1 capsule (2 mg total) 3 (three) times daily by mouth. 90 capsule 11  . traMADol (ULTRAM) 50 MG tablet Take 1-2 tablets (50-100 mg total) by mouth every 6 (six) hours as needed for moderate pain or severe pain. 30 tablet 0  . traZODone (DESYREL) 50 MG tablet Take 25-50 mg by mouth at bedtime as needed for sleep.    Marland Kitchen triamcinolone cream (KENALOG) 0.1 %      No current facility-administered medications for this visit.      Past Medical History:  Diagnosis Date  . Acute respiratory failure with hypoxia ([place]) 12/06/2012  . Allergy   . Blindness of right eye   . Complication of anesthesia   . Dehydration with hyponatremia 12/07/2012  . Diabetes mellitus without complication ([place])    diagnosed 2 weeks ago- No CBG meter as of yet.  Marland Kitchen DIVERTICULITIS, HX OF 01/27/2009   Qualifier: Diagnosis of  By: Jerold Coombe    . Gait disorder 06/05/2012  . GERD (gastroesophageal reflux disease)   . Glaucoma   . Glucose intolerance (impaired glucose tolerance)   . History of diverticulitis   . Hypertension   .  Legally blind   . Migraine triggered seizures ([place])    history of  . Migraines   . Neuropathy   . OP (osteoporosis)   . Osteoarthritis    osteoarthritis -right knee. uses walker. Left shoulder -"Limited ROM"  . Paroxysmal atrial fibrillation ([place]) 12/06/2012   Echo (12/07/12): Moderate focal basal hypertrophy of the septum, vigorous LV function, EF 65-70%, indeterminate diastolic function, normal wall motion, trivial MR, moderate LAE, trivial TR;  Xarelto started 11/2012  . Peripheral neuropathy    neuropathy  . Pneumonia   . PONV (postoperative nausea and  vomiting)    no problems other past few surgeries  . SHINGLES, HX OF 07/17/2009   Qualifier: Diagnosis of  By: Jerold Coombe    . Siriasis   . Sjogren's syndrome ([place])    bilateral vision  . Sleep apnea    does not wear CPAP  . Stroke [facility])    residual - rare Intermittent expressive aphasia,    Past Surgical History:  Procedure Laterality Date  . BONE TUMOR EXCISION Right    arm  . CATARACT EXTRACTION    . COLONOSCOPY WITH PROPOFOL N/A 06/09/2015   Procedure: COLONOSCOPY WITH PROPOFOL;  Surgeon: Ronald Lobo, MD;  Location: [place];  Service: Endoscopy;  Laterality: N/A;  . DILATION AND CURETTAGE OF UTERUS    . ESOPHAGOGASTRODUODENOSCOPY N/A 04/17/2016   Procedure: ESOPHAGOGASTRODUODENOSCOPY (EGD);  Surgeon: Teena Irani, MD;  Location: [facility] ENDOSCOPY;  Service: Endoscopy;  Laterality: N/A;  . ESOPHAGOGASTRODUODENOSCOPY (EGD) WITH PROPOFOL N/A 06/09/2015   Procedure: ESOPHAGOGASTRODUODENOSCOPY (EGD) WITH PROPOFOL;  Surgeon: Ronald Lobo, MD;  Location: [facility] ENDOSCOPY;  Service: Endoscopy;  Laterality: N/A;  . EYE SURGERY    . FOOT FRACTURE SURGERY Left 2007  . GIVENS CAPSULE STUDY N/A 04/17/2016   Procedure: GIVENS CAPSULE STUDY;  Surgeon: Teena Irani, MD;  Location: [place];  Service: Endoscopy;  Laterality: N/A;  . KNEE ARTHROSCOPY Right   . LUMBAR LAMINECTOMY    . ROBOT ASSISTED LAPAROSCOPIC PARTIAL COLECTOMY  02/07/2016   Procedure: ROBOT ASSISTED LAPAROSCOPIC PARTIAL COLECTOMY;  Surgeon: Leighton Ruff, MD;  Location: [facility];  Service: General;;  . SHOULDER OPEN ROTATOR CUFF REPAIR    . TONSILLECTOMY AND ADENOIDECTOMY    . TOTAL ABDOMINAL HYSTERECTOMY W/ BILATERAL SALPINGOOPHORECTOMY      Social History   Socioeconomic History  . Marital status: Married    Spouse name: jesse  . Number of children: 2  . Years of education: college  . Highest education level: Not on file  Social Needs  . Financial resource strain: Not on file  . Food insecurity - worry: Not on  file  . Food insecurity - inability: Not on file  . Transportation needs - medical: Not on file  . Transportation needs - non-medical: Not on file  Occupational History  . Occupation: retired  Tobacco Use  . Smoking status: Former Smoker    Types: Cigarettes  . Smokeless tobacco: Never Used  . Tobacco comment: quit smoking before age of 54  Substance and Sexual Activity  . Alcohol use: No    Alcohol/week: 0.0 oz  . Drug use: No  . Sexual activity: Not on file  Other Topics Concern  . Not on file  Social History Narrative   She lives with husband of 1 years.  They have a one story home.  One daughter.     Family History  Problem Relation Age of Onset  . Stroke Mother   . Migraines Mother   .  Stomach cancer Father   . Sjogren's syndrome Father   . Sjogren's syndrome Brother   . Sjogren's syndrome Brother   . Scleroderma Sister        raynaud's scleroderma  . Lymphoma Daughter   . Migraines Daughter   . Heart disease Unknown        maternal side  . Stroke Unknown        maternal side, 1st degree female <60    ROS: no fevers or chills, productive cough, hemoptysis, dysphasia, odynophagia, melena, hematochezia, dysuria, hematuria, rash, seizure activity, orthopnea, PND, pedal edema, claudication. Remaining systems are negative.  Physical Exam: Well-developed obese in no acute distress.  Skin is warm and dry.  HEENT is normal.  Neck is supple.  Chest is clear to auscultation with normal expansion.  Cardiovascular exam is irregular Abdominal exam nontender or distended. No masses palpated. Extremities show no edema. neuro grossly intact  ECG- atrial fibrillation at a rate of 90. Normal axis. Cannot rule out prior septal infarct. Nonspecific ST changes.personally reviewed  A/P  1 permanent atrial fibrillation-we have not pursued cardioversion in the past at patient's request. She is asymptomatic and I think this is appropriate. Continue Cardizem for rate control.  Continue apixaban. Check hemoglobin and renal function.  2 hypertension-blood pressure is controlled. Continue present medications.  3 hyperlipidemia-managed by primary care.  Kirk Ruths, MD

## 2017-03-27 DIAGNOSIS — D509 Iron deficiency anemia, unspecified: Secondary | ICD-10-CM | POA: Diagnosis not present

## 2017-03-27 DIAGNOSIS — I4891 Unspecified atrial fibrillation: Secondary | ICD-10-CM | POA: Diagnosis not present

## 2017-03-27 DIAGNOSIS — D500 Iron deficiency anemia secondary to blood loss (chronic): Secondary | ICD-10-CM | POA: Diagnosis not present

## 2017-03-27 DIAGNOSIS — I480 Paroxysmal atrial fibrillation: Secondary | ICD-10-CM | POA: Diagnosis not present

## 2017-03-27 DIAGNOSIS — E1143 Type 2 diabetes mellitus with diabetic autonomic (poly)neuropathy: Secondary | ICD-10-CM | POA: Diagnosis not present

## 2017-03-27 DIAGNOSIS — E785 Hyperlipidemia, unspecified: Secondary | ICD-10-CM | POA: Diagnosis not present

## 2017-03-27 DIAGNOSIS — M47816 Spondylosis without myelopathy or radiculopathy, lumbar region: Secondary | ICD-10-CM | POA: Diagnosis not present

## 2017-03-31 DIAGNOSIS — H401133 Primary open-angle glaucoma, bilateral, severe stage: Secondary | ICD-10-CM | POA: Diagnosis not present

## 2017-03-31 NOTE — Procedures (Signed)
ELECTROENCEPHALOGRAM REPORT  Dates of Recording: 03/24/2017 12:14PM to 03/26/2017 8:19AM  Patient's Name: Krystal Clarke MRN: [id number] Date of Birth: 1935-11-11  Referring Provider: Dr. Narda Amber  Procedure: 48-hour ambulatory EEG  History: This is a 82 year old woman with recurrent episodes of frequent and vigorous sneezing spells with right arm tremor  Medications:  LYRICA 150 MG capsule FLEXERIL 5 MG tablet  CARDIZEM CD 180 MG 24 hr capsule  TRUSOPT 2 % ophthalmic solution   COSOPT 22.3-6.8 MG/ML ophthalmic solution    ELIQUIS 5 MG TABS tablet  TRICOR 145 MG tablet   FERREX 150 150 MG capsule   GLUCOPHAGE-XR 500 MG 24 hr tablet  ZEGERID 40-1100 MG capsule . ZOFRAN 4 MG tablet  BETIMOL 0.5 % ophthalmic solution  ZANAFLEX 2 MG capsule ULTRAM 50 MG tablet DESYREL 50 MG tablet   KENALOG 0.1 %   Technical Summary: This is a 48-hour multichannel digital EEG recording measured by the international 10-20 system with electrodes applied with paste and impedances below 5000 ohms performed as portable with EKG monitoring.  The digital EEG was referentially recorded, reformatted, and digitally filtered in a variety of bipolar and referential montages for optimal display.    DESCRIPTION OF RECORDING: During maximal wakefulness, the background activity consisted of a symmetric 8Hz  posterior dominant rhythm which was reactive to eye opening.  There were no epileptiform discharges or focal slowing seen in wakefulness.  During the recording, the patient progresses through wakefulness, drowsiness, and Stage 2 sleep.  Again, there were no epileptiform discharges seen.  Events: On 1/7 at 2100 hours, she has a sting on the back of her neck. Electrographically, there were no EEG or EKG changes seen.  On 1/8 at 1100hours, she has a sting in her neck. Electrographically, there were no EEG or EKG changes seen.  On 1/8 at 1408 hours, she reports a spell. Patient is not on video at  this time. Electrographically, there were no EEG or EKG changes seen.  On 1/9 at 0900 hours, she has a cold sweat. Electrographically, there were no EEG or EKG changes seen.  There were no electrographic seizures seen.  EKG lead showed irregular rhythm.  IMPRESSION: This 48-hour ambulatory EEG study is normal.    CLINICAL CORRELATION: A normal EEG does not exclude a clinical diagnosis of epilepsy. Patient reported a spell (not captured on video), with no epileptiform discharges on EEG. Episodes of sting on neck and cold sweat did not show any EEG changes as well. If further clinical questions remain, inpatient video EEG monitoring may be helpful.   Ellouise Newer, M.D.

## 2017-04-01 ENCOUNTER — Telehealth: Payer: Self-pay | Admitting: *Deleted

## 2017-04-01 NOTE — Telephone Encounter (Signed)
-----   Message from Alda Berthold, DO sent at 03/31/2017 10:54 AM EST ----- Please inform Mrs. Dilan that her EEG was normal - no evidence of seizures causing these shaking spells.

## 2017-04-01 NOTE — Telephone Encounter (Signed)
Patient and husband given the results.

## 2017-04-02 ENCOUNTER — Ambulatory Visit (INDEPENDENT_AMBULATORY_CARE_PROVIDER_SITE_OTHER): Payer: Medicare Other | Admitting: Cardiology

## 2017-04-02 ENCOUNTER — Encounter: Payer: Self-pay | Admitting: Cardiology

## 2017-04-02 VITALS — BP 124/80 | HR 90 | Ht 60.0 in | Wt 175.8 lb

## 2017-04-02 DIAGNOSIS — E7800 Pure hypercholesterolemia, unspecified: Secondary | ICD-10-CM

## 2017-04-02 DIAGNOSIS — I481 Persistent atrial fibrillation: Secondary | ICD-10-CM | POA: Diagnosis not present

## 2017-04-02 DIAGNOSIS — I4819 Other persistent atrial fibrillation: Secondary | ICD-10-CM

## 2017-04-02 DIAGNOSIS — I10 Essential (primary) hypertension: Secondary | ICD-10-CM | POA: Diagnosis not present

## 2017-04-02 LAB — BASIC METABOLIC PANEL WITH GFR
BUN/Creatinine Ratio: 14 (ref 12–28)
BUN: 14 mg/dL (ref 8–27)
CO2: 26 mmol/L (ref 20–29)
Calcium: 10.7 mg/dL — ABNORMAL HIGH (ref 8.7–10.3)
Chloride: 96 mmol/L (ref 96–106)
Creatinine, Ser: 0.99 mg/dL (ref 0.57–1.00)
GFR calc Af Amer: 63 mL/min/1.73
GFR calc non Af Amer: 54 mL/min/1.73 — ABNORMAL LOW
Glucose: 99 mg/dL (ref 65–99)
Potassium: 4.6 mmol/L (ref 3.5–5.2)
Sodium: 137 mmol/L (ref 134–144)

## 2017-04-02 LAB — CBC
Hematocrit: 47.5 % — ABNORMAL HIGH (ref 34.0–46.6)
Hemoglobin: 16.2 g/dL — ABNORMAL HIGH (ref 11.1–15.9)
MCH: 31.1 pg (ref 26.6–33.0)
MCHC: 34.1 g/dL (ref 31.5–35.7)
MCV: 91 fL (ref 79–97)
Platelets: 255 x10E3/uL (ref 150–379)
RBC: 5.21 x10E6/uL (ref 3.77–5.28)
RDW: 14.5 % (ref 12.3–15.4)
WBC: 8.4 x10E3/uL (ref 3.4–10.8)

## 2017-04-02 NOTE — Patient Instructions (Signed)

## 2017-04-09 DIAGNOSIS — H16001 Unspecified corneal ulcer, right eye: Secondary | ICD-10-CM | POA: Diagnosis not present

## 2017-04-09 DIAGNOSIS — Z9842 Cataract extraction status, left eye: Secondary | ICD-10-CM | POA: Diagnosis not present

## 2017-04-09 DIAGNOSIS — Z961 Presence of intraocular lens: Secondary | ICD-10-CM | POA: Diagnosis not present

## 2017-04-09 DIAGNOSIS — M3501 Sicca syndrome with keratoconjunctivitis: Secondary | ICD-10-CM | POA: Diagnosis not present

## 2017-04-09 DIAGNOSIS — H18893 Other specified disorders of cornea, bilateral: Secondary | ICD-10-CM | POA: Diagnosis not present

## 2017-04-09 DIAGNOSIS — Z9841 Cataract extraction status, right eye: Secondary | ICD-10-CM | POA: Diagnosis not present

## 2017-04-11 DIAGNOSIS — H16001 Unspecified corneal ulcer, right eye: Secondary | ICD-10-CM | POA: Diagnosis not present

## 2017-04-11 DIAGNOSIS — Z961 Presence of intraocular lens: Secondary | ICD-10-CM | POA: Diagnosis not present

## 2017-04-11 DIAGNOSIS — M3501 Sicca syndrome with keratoconjunctivitis: Secondary | ICD-10-CM | POA: Diagnosis not present

## 2017-04-11 DIAGNOSIS — H02403 Unspecified ptosis of bilateral eyelids: Secondary | ICD-10-CM | POA: Diagnosis not present

## 2017-04-11 DIAGNOSIS — H18893 Other specified disorders of cornea, bilateral: Secondary | ICD-10-CM | POA: Diagnosis not present

## 2017-04-14 DIAGNOSIS — Z961 Presence of intraocular lens: Secondary | ICD-10-CM | POA: Diagnosis not present

## 2017-04-14 DIAGNOSIS — M3501 Sicca syndrome with keratoconjunctivitis: Secondary | ICD-10-CM | POA: Diagnosis not present

## 2017-04-14 DIAGNOSIS — H18893 Other specified disorders of cornea, bilateral: Secondary | ICD-10-CM | POA: Diagnosis not present

## 2017-04-14 DIAGNOSIS — H02403 Unspecified ptosis of bilateral eyelids: Secondary | ICD-10-CM | POA: Diagnosis not present

## 2017-04-14 DIAGNOSIS — H16001 Unspecified corneal ulcer, right eye: Secondary | ICD-10-CM | POA: Diagnosis not present

## 2017-04-22 ENCOUNTER — Encounter: Payer: Self-pay | Admitting: Neurology

## 2017-04-22 ENCOUNTER — Ambulatory Visit (INDEPENDENT_AMBULATORY_CARE_PROVIDER_SITE_OTHER): Payer: Medicare Other | Admitting: Neurology

## 2017-04-22 VITALS — BP 104/64 | HR 93 | Ht 60.0 in | Wt 176.4 lb

## 2017-04-22 DIAGNOSIS — M3506 Sjogren syndrome with peripheral nervous system involvement: Secondary | ICD-10-CM

## 2017-04-22 DIAGNOSIS — R413 Other amnesia: Secondary | ICD-10-CM | POA: Diagnosis not present

## 2017-04-22 DIAGNOSIS — M3509 Sicca syndrome with other organ involvement: Secondary | ICD-10-CM | POA: Diagnosis not present

## 2017-04-22 DIAGNOSIS — R202 Paresthesia of skin: Secondary | ICD-10-CM | POA: Diagnosis not present

## 2017-04-22 DIAGNOSIS — G44221 Chronic tension-type headache, intractable: Secondary | ICD-10-CM

## 2017-04-22 DIAGNOSIS — G63 Polyneuropathy in diseases classified elsewhere: Secondary | ICD-10-CM

## 2017-04-22 MED ORDER — METHYLPREDNISOLONE 4 MG PO TBPK: ORAL_TABLET | ORAL | 0 refills | Status: DC

## 2017-04-22 NOTE — Patient Instructions (Addendum)
Please call my office with the name of the medication   Start medrol dose pack   Return to clinic in 5 months

## 2017-04-22 NOTE — Progress Notes (Signed)
Follow-up Visit   Date: 04/22/17    Krystal Clarke MRN: [id number] DOB: 12-03-1935   [name] is a 82 y.o. right-handed Caucasian female with hypertension, Sjogren's syndrome (diagnosed 0932) complicated by neuropathy and vision impairment, migraines, atrial fibrillation (on apixiban), history of shingles affecting left T6 (07/2009) and stroke (September 2014, residual word-finding difficulty) returning to the clinic for follow-up visit for neuropathy.   History of present illness: She was under the care of Dr. Erling Cruz at [facility] for a over 30 years for migraines, stroke, peripheral neuropathy, headaches, and gait instability. She has a long history of migraines or started at the age of 77. Her headaches were well controlled on Depakote 250 twice a day. However, it was stopped due to low platelets and anticoagulation. Previously tried medication for headaches is depakote, Botox (2 trials without benefit) and maxalt. Being on apixiban, she is unable to take NSAIDs due to bleeding risk.  She was given a prednisone taper dose with dramatic improvement of headaches and has been relatively well since then.   She was admitted to the hospital in September 2014 for community-acquired pneumonia and found to have new onset atrial fibrillation. Due to worsening headaches, MRI of the brain was obtained in October which showed a small left thalamic subacute stroke.  In 2015, she was started on Lyrica 150-300mg  for headaches and neuropathy and responded well.    In 2017, she was having sneezing spells with shaking of the arms.  This was evaluated with EEG which was normal and eventually became less frequent.  She was hospitalized in November 2018 with colovesical fistula s/p robotic colectomy & bladder closure 02/07/2016.  - UPDATE 09/16/2016:  She is here for 6 month appointment.  She no longer has stabbing or stinging pain in her neck, but has low dull headache.  Her painful neuropathy  is well-controlled on Lyrica 150-300mg . She is having greater difficulty with walking and is interested in getting an electric scooter as her husband cannot push a wheelchair. She is very bothered by the increased frequency of sneezing that occurs in conjunction with right arm shaking, which occurs daily and more intense than in the past. She is awake during this time and there is no LOC.  Over the past three weeks, she also feels as if she may have had a minor stroke because her confusion and word-finding difficulty is worse. No lateralizing weakness of paresthesias. She remains on Eliquis for atrial fibrillation and had some palpitations a few weeks ago.   UPDATE 02/14/2017: Today, she continues to complains of frequent and vigourous sneezing spells with right arm tremor, now occurring daily.  There is never any loss of consciousness.  Her Lyrica was reduced to 150mg  BID by her PCP and she has been tolerating this well - no worsening migraines or neuropathy. She complains of sharp pain at the base of head on the right which was controlled with tizanidine, but now occurs about 2-4 times per week. Husband says that she stays very tired and sleepy during the day.  Her last visit in August was for motorized power chair assessment, but she has not heard anything about whether this was approved or not.   UPDATE 04/22/2017:  She is here for follow-up visit. Patient arrived 50 minutes late.  She received her motorized chair last week.  She continues to struggle with remembering words. Her headaches doing much better on tizanidine 2mg , occurring 2-3 times per week and lasting a few  hours.  She treats them with tylenol which helps. She is complaining of stinging sensation at the base of the head, which is worse.   Neuropathy remains well-controlled on Lyrica 150mg  twice daily.   Medications:  Current Outpatient Medications on File Prior to Visit  Medication Sig Dispense Refill  . cyclobenzaprine (FLEXERIL) 5 MG  tablet TAKE 1 TABLET BY MOUTH AT BEDTIME 90 tablet 3  . diltiazem (CARDIZEM CD) 180 MG 24 hr capsule TAKE 1 CAPSULE (180 MG TOTAL) BY MOUTH DAILY. 90 capsule 3  . dorzolamide (TRUSOPT) 2 % ophthalmic solution Place 1 drop into both eyes 2 (two) times daily.    . dorzolamide-timolol (COSOPT) 22.3-6.8 MG/ML ophthalmic solution     . ELIQUIS 5 MG TABS tablet TAKE 1 TABLET (5 MG TOTAL) BY MOUTH 2 (TWO) TIMES DAILY. 180 tablet 1  . fenofibrate (TRICOR) 145 MG tablet Take 145 mg by mouth at bedtime.   0  . FERREX 150 150 MG capsule     . metFORMIN (GLUCOPHAGE-XR) 500 MG 24 hr tablet Take 500 mg by mouth every evening.     Marland Kitchen omeprazole-sodium bicarbonate (ZEGERID) 40-1100 MG capsule Take 1 capsule by mouth daily before breakfast.    . ondansetron (ZOFRAN) 4 MG tablet Take 4 mg by mouth every 8 (eight) hours as needed for nausea or vomiting.    . pregabalin (LYRICA) 150 MG capsule Take 150-300 mg by mouth 2 (two) times daily. Pt takes one capsule every morning and two at bedtime.    . timolol (BETIMOL) 0.5 % ophthalmic solution Place 1 drop into both eyes 2 (two) times daily.     . tizanidine (ZANAFLEX) 2 MG capsule Take 1 capsule (2 mg total) 3 (three) times daily by mouth. 90 capsule 11  . traMADol (ULTRAM) 50 MG tablet Take 1-2 tablets (50-100 mg total) by mouth every 6 (six) hours as needed for moderate pain or severe pain. 30 tablet 0  . traZODone (DESYREL) 50 MG tablet Take 25-50 mg by mouth at bedtime as needed for sleep.    Marland Kitchen triamcinolone cream (KENALOG) 0.1 %      No current facility-administered medications on file prior to visit.     Allergies:  Allergies  Allergen Reactions  . Oxycodone-Aspirin Nausea And Vomiting  . Codeine Nausea And Vomiting  . Hydrocodone Nausea And Vomiting  . Morphine And Related Nausea And Vomiting  . Oxycodone Nausea And Vomiting  . Pilocarpine Other (See Comments)    Reaction:  Unknown   . Rivaroxaban Other (See Comments)    Reaction:  Back pain/headaches       Review of Systems:  CONSTITUTIONAL: No fevers, chills, night sweats, no weight loss.   EYES: +visual changes or eye pain ENT: No hearing changes.  No history of nose bleeds.   RESPIRATORY: No cough, wheezing and shortness of breath.   CARDIOVASCULAR: Negative for chest pain, and palpitations.   GI: No abdominal discomfort, blood in stools or black stools.  No recent change in bowel habits.   GU:  No history of incontinence.  MUSCLOSKELETAL: +history of joint pain or swelling.  No myalgias.   SKIN: No for lesions, rash, and itching.   ENDOCRINE: Negative for cold or heat intolerance, polydipsia or goiter.   PSYCH:  No depression or anxiety symptoms.   NEURO: As Above.   Vital Signs:  BP 104/64   Pulse 93   Ht 5' (1.524 m)   Wt 176 lb 7 oz (80 kg)   SpO2  97%   BMI 34.46 kg/m   Neurological Exam: MENTAL STATUS including orientation to time, place, person, recent and remote memory, attention span and concentration, language, and fund of knowledge is fair.   Speech is not dysarthric.    CRANIAL NERVES:  Severe visual impairment bilaterally, blind in the right eye, gross movements in the left.  Mild bilateral ptosis. Face is symmetric. Dry oral mucosa and tongue.   There is no tenderness over the right GON nerves, tenderness is at the central occipital region  MOTOR: Motor strength is 5-/5 proximally.  L dorsiflexion and toe extension is 4/5.     COORDINATION/GAIT: Gait is moderately wide-based with small, short steps assisted with walker.   Data: MRI brain wo contrast 12/23/2012: Very small acute/ subacute left thalamic nonhemorrhagic infarct. Prominent small vessel disease type changes. No intracranial hemorrhage. Mild atrophy without hydrocephalus.  Prior work-up at [facility]: EEG 06/03/1995, 08/31/2008, 05/25/2015: normal  CSF 1991: normal  Ambulatory EEG 03/24/2017:  Normal  Lab Results  Component Value Date   VITAMINB12 260 04/12/2016   Lab Results  Component Value Date    TSH 0.834 12/06/2012    IMPRESSION/PLAN:   1. Chronic headaches, now with paresthesias over the base of the neck.  - Start medrol dose pack  2.  Tension headaches are improved with tizanidine 2mg  at bedtime  3.  Mixed large and small fiber sensorimotor peripheral neuropathy due to Sjogren's disease, stable.   - Numbness involving glove-stocking distribution as a progression of the disease  - Pain is controlled with Lyrica 150mg  BID (lowered from 150-300)  4. Migraine without aura, chronic daily headaches - stable  - Previously used:  VPA (stopped due to low PLT), triptans (stroke), flexeril (no improvement), botox, medrol dose pack (effective)  5.  Spells of sneezing with associated abnormal hand movements of the right arm, unclear etiology.  Ambulatory EEG is normal.  Patient reassured these movements are not epileptic.  6. Multifactorial gait instability with generalized weakness, uses 4-wheeled rollator and has received her motorized power chair  7. Left thalamic stroke, likely cardioembolic due to afib on apixiban with residual expressive aphasia  8. Age-related memory loss possibly transitioning into vascular dementia  - Neuropsychological testing 05/25/2010: Decreased verbal fluency, otherwise normal exam.  - We discussed repeating neuropsychological testing, but given her visual impairment, cognition would be difficult to assess accurately  - She was asking about a medication that she saw in a commercial but does not recall the name, I asked her to call my office with the name so I can decide if this is suitable for her or not.  -   Return to clinic in 5 months  Greater than 50% of this 20 minute visit was spent in counseling, explanation of diagnosis, planning of further management, and coordination of care.   Thank you for allowing me to participate in patient's care.  If I can answer any additional questions, I would be pleased to do so.    Sincerely,    Lateya Dauria K.  Posey Pronto, DO

## 2017-04-23 DIAGNOSIS — H18893 Other specified disorders of cornea, bilateral: Secondary | ICD-10-CM | POA: Diagnosis not present

## 2017-04-23 DIAGNOSIS — H16001 Unspecified corneal ulcer, right eye: Secondary | ICD-10-CM | POA: Diagnosis not present

## 2017-04-23 DIAGNOSIS — Z961 Presence of intraocular lens: Secondary | ICD-10-CM | POA: Diagnosis not present

## 2017-04-23 DIAGNOSIS — M3501 Sicca syndrome with keratoconjunctivitis: Secondary | ICD-10-CM | POA: Diagnosis not present

## 2017-04-23 DIAGNOSIS — H02403 Unspecified ptosis of bilateral eyelids: Secondary | ICD-10-CM | POA: Diagnosis not present

## 2017-04-24 ENCOUNTER — Telehealth: Payer: Self-pay | Admitting: Neurology

## 2017-04-24 NOTE — Telephone Encounter (Signed)
Patient called and would like you to please call her regarding a medication for her memory. She lmom and tried to pronounce it. Thanks

## 2017-04-25 NOTE — Telephone Encounter (Signed)
Called patient back and left message for her to call me back.  Instructed her to leave voicemail with the spelling of the medication.

## 2017-04-28 DIAGNOSIS — R5383 Other fatigue: Secondary | ICD-10-CM | POA: Diagnosis not present

## 2017-04-28 DIAGNOSIS — E669 Obesity, unspecified: Secondary | ICD-10-CM | POA: Diagnosis not present

## 2017-04-28 DIAGNOSIS — E538 Deficiency of other specified B group vitamins: Secondary | ICD-10-CM | POA: Diagnosis not present

## 2017-04-28 DIAGNOSIS — E559 Vitamin D deficiency, unspecified: Secondary | ICD-10-CM | POA: Diagnosis not present

## 2017-04-28 DIAGNOSIS — D509 Iron deficiency anemia, unspecified: Secondary | ICD-10-CM | POA: Diagnosis not present

## 2017-04-28 DIAGNOSIS — Z6832 Body mass index (BMI) 32.0-32.9, adult: Secondary | ICD-10-CM | POA: Diagnosis not present

## 2017-05-04 ENCOUNTER — Other Ambulatory Visit: Payer: Self-pay | Admitting: Cardiology

## 2017-05-05 DIAGNOSIS — H401133 Primary open-angle glaucoma, bilateral, severe stage: Secondary | ICD-10-CM | POA: Diagnosis not present

## 2017-05-05 DIAGNOSIS — H16001 Unspecified corneal ulcer, right eye: Secondary | ICD-10-CM | POA: Diagnosis not present

## 2017-05-05 DIAGNOSIS — H18893 Other specified disorders of cornea, bilateral: Secondary | ICD-10-CM | POA: Diagnosis not present

## 2017-05-07 DIAGNOSIS — H02403 Unspecified ptosis of bilateral eyelids: Secondary | ICD-10-CM | POA: Diagnosis not present

## 2017-05-07 DIAGNOSIS — Z961 Presence of intraocular lens: Secondary | ICD-10-CM | POA: Diagnosis not present

## 2017-05-07 DIAGNOSIS — M3501 Sicca syndrome with keratoconjunctivitis: Secondary | ICD-10-CM | POA: Diagnosis not present

## 2017-05-07 DIAGNOSIS — H16001 Unspecified corneal ulcer, right eye: Secondary | ICD-10-CM | POA: Diagnosis not present

## 2017-05-07 DIAGNOSIS — H18893 Other specified disorders of cornea, bilateral: Secondary | ICD-10-CM | POA: Diagnosis not present

## 2017-05-14 DIAGNOSIS — M1711 Unilateral primary osteoarthritis, right knee: Secondary | ICD-10-CM | POA: Diagnosis not present

## 2017-05-26 ENCOUNTER — Telehealth: Payer: Self-pay | Admitting: Neurology

## 2017-05-26 NOTE — Telephone Encounter (Signed)
Patient called due to swelling in her foot and she Increased Lyrica by 1 pill at night. Please Call. Thanks

## 2017-05-26 NOTE — Telephone Encounter (Signed)
I called patient back and she said that her foot is swollen twice its normal size.  She said that she has been putting blue emu cream on it and it is helping a little but her foot is still very painful.  Instructed her to call her PCP to see if they can get her in to be seen.  Patient agreed.

## 2017-05-27 DIAGNOSIS — R609 Edema, unspecified: Secondary | ICD-10-CM | POA: Diagnosis not present

## 2017-05-27 DIAGNOSIS — I480 Paroxysmal atrial fibrillation: Secondary | ICD-10-CM | POA: Diagnosis not present

## 2017-05-27 DIAGNOSIS — L03115 Cellulitis of right lower limb: Secondary | ICD-10-CM | POA: Diagnosis not present

## 2017-05-27 DIAGNOSIS — R21 Rash and other nonspecific skin eruption: Secondary | ICD-10-CM | POA: Diagnosis not present

## 2017-05-27 DIAGNOSIS — R600 Localized edema: Secondary | ICD-10-CM | POA: Diagnosis not present

## 2017-06-03 ENCOUNTER — Ambulatory Visit (INDEPENDENT_AMBULATORY_CARE_PROVIDER_SITE_OTHER): Payer: Medicare Other | Admitting: Adult Health

## 2017-06-03 ENCOUNTER — Encounter: Payer: Self-pay | Admitting: Adult Health

## 2017-06-03 VITALS — BP 120/82 | HR 99 | Ht 60.0 in | Wt 182.0 lb

## 2017-06-03 DIAGNOSIS — I10 Essential (primary) hypertension: Secondary | ICD-10-CM

## 2017-06-03 DIAGNOSIS — R600 Localized edema: Secondary | ICD-10-CM | POA: Diagnosis not present

## 2017-06-03 DIAGNOSIS — I519 Heart disease, unspecified: Secondary | ICD-10-CM | POA: Diagnosis not present

## 2017-06-03 DIAGNOSIS — Z79899 Other long term (current) drug therapy: Secondary | ICD-10-CM | POA: Diagnosis not present

## 2017-06-03 DIAGNOSIS — I481 Persistent atrial fibrillation: Secondary | ICD-10-CM | POA: Diagnosis not present

## 2017-06-03 DIAGNOSIS — I4819 Other persistent atrial fibrillation: Secondary | ICD-10-CM

## 2017-06-03 LAB — BASIC METABOLIC PANEL
BUN/Creatinine Ratio: 16 (ref 12–28)
BUN: 12 mg/dL (ref 8–27)
CALCIUM: 10.4 mg/dL — AB (ref 8.7–10.3)
CHLORIDE: 97 mmol/L (ref 96–106)
CO2: 26 mmol/L (ref 20–29)
Creatinine, Ser: 0.76 mg/dL (ref 0.57–1.00)
GFR calc non Af Amer: 75 mL/min/{1.73_m2} (ref >59)
GFR, EST AFRICAN AMERICAN: 86 mL/min/{1.73_m2} (ref >59)
GLUCOSE: 123 mg/dL — AB (ref 65–99)
POTASSIUM: 4 mmol/L (ref 3.5–5.2)
Sodium: 138 mmol/L (ref 134–144)

## 2017-06-03 MED ORDER — FUROSEMIDE 20 MG PO TABS: 20.0000 mg | ORAL_TABLET | Freq: Every day | ORAL | 3 refills | Status: DC

## 2017-06-03 NOTE — Patient Instructions (Signed)
Medication Instructions:  TAKE LASIX 20MG  DAILY  If you need a refill on your cardiac medications before your next appointment, please call your pharmacy.  Labwork: BMET TODAY HERE IN OUR OFFICE AT [facility]  TESTING: Echocardiogram - Your physician has requested that you have an echocardiogram. Echocardiography is a painless test that uses sound waves to create images of your heart. It provides your doctor with information about the size and shape of your heart and how well your heart's chambers and valves are working. This procedure takes approximately one hour. There are no restrictions for this procedure. This will be performed at our [facility] location - [address].  Your physician has requested that you have a lower or upper extremity vascular US. This test is an ultrasound of the VEINS in the legs. It looks at [facility] blood flow in the legs and arms. Allow one hour for Lower VASCULAR scans. There are no restrictions or special instructions  SPECIAL INSTRUCTIONS: MAKE SURE TO GET AND WEAR COMPRESSION STOCKINGS AT [place] (ZIPPERED) DAILY   Follow-Up: Your physician wants you to follow-up in: ASAP-[address] [place].  Thank you for choosing [facility] at [facility]!!

## 2017-06-03 NOTE — Progress Notes (Signed)
Cardiology Office Note   Date:  06/03/2017   ID:  Krystal Clarke, DOB [birth year], MRN [id number]  PCP:  Harlan Stains, MD  Cardiologist: [facility]   Chief Complaint  Patient presents with  . Foot Swelling     History of Present Illness: Krystal Clarke is a 82 y.o. female who presents for ongoing assessment and management of atrial fibrillation, on Xarelto, hypertension, hyperlipidemia, with history of Sjogren;s  syndrome and seizure disorder. Patient had hospitalization on 11/29/2012 with functional decline and generalized weakness, MRI in October 2014 showed very small acute/subacute left traumatic infarct. She was last seen by Dr. Stanford Breed on 04/02/2017, she declined cardioversion. She was asymptomatic. She was continued on diltiazem for rate control and changed to apixaban 5 mg twice a day. At that time blood pressure was well-controlled, labs for cholesterol followed by primary care.  She comes today with complaints of lower extremity edema worse on the right. She has been followed by her primary care physician Dr. Dema Severin, who has prescribed Lasix which she was to take for 7 days she does not remember the dose, she did not bring it with her. It did help to relieve some lower extremity edema but she remains edematous. She refuses support hose. She is wearing knee-high stockings which are cutting off circulation around the proximal pretibial area just under the knee. She does have pain above the sock line. There is no pain when she walks. She states that the edema has occurred over the last few weeks, normally her legs are very thin. She also complains of some reddish blotches on the right leg. She was placed on antibiotic therapy by PCP.  She and her husband admits to eating out a lot, but not adding salt to food at home.  Past Medical History:  Diagnosis Date  . Acute respiratory failure with hypoxia ([place]) 12/06/2012  . Allergy   . Blindness of right eye   . Complication of  anesthesia   . Dehydration with hyponatremia 12/07/2012  . Diabetes mellitus without complication ([place])    diagnosed 2 weeks ago- No CBG meter as of yet.  Marland Kitchen DIVERTICULITIS, HX OF 01/27/2009   Qualifier: Diagnosis of  By: Jerold Coombe    . Gait disorder 06/05/2012  . GERD (gastroesophageal reflux disease)   . Glaucoma   . Glucose intolerance (impaired glucose tolerance)   . History of diverticulitis   . Hypertension   . Legally blind   . Migraine triggered seizures ([place])    history of  . Migraines   . Neuropathy   . OP (osteoporosis)   . Osteoarthritis    osteoarthritis -right knee. uses walker. Left shoulder -"Limited ROM"  . Paroxysmal atrial fibrillation ([place]) 12/06/2012   Echo (12/07/12): Moderate focal basal hypertrophy of the septum, vigorous LV function, EF 65-70%, indeterminate diastolic function, normal wall motion, trivial MR, moderate LAE, trivial TR;  Xarelto started 11/2012  . Peripheral neuropathy    neuropathy  . Pneumonia   . PONV (postoperative nausea and vomiting)    no problems other past few surgeries  . SHINGLES, HX OF 07/17/2009   Qualifier: Diagnosis of  By: Jerold Coombe    . Siriasis   . Sjogren's syndrome ([place])    bilateral vision  . Sleep apnea    does not wear CPAP  . Stroke [facility])    residual - rare Intermittent expressive aphasia,    Past Surgical History:  Procedure Laterality Date  . BONE TUMOR EXCISION Right  arm  . CATARACT EXTRACTION    . COLONOSCOPY WITH PROPOFOL N/A 06/09/2015   Procedure: COLONOSCOPY WITH PROPOFOL;  Surgeon: Ronald Lobo, MD;  Location: [place];  Service: Endoscopy;  Laterality: N/A;  . DILATION AND CURETTAGE OF UTERUS    . ESOPHAGOGASTRODUODENOSCOPY N/A 04/17/2016   Procedure: ESOPHAGOGASTRODUODENOSCOPY (EGD);  Surgeon: Teena Irani, MD;  Location: [facility] ENDOSCOPY;  Service: Endoscopy;  Laterality: N/A;  . ESOPHAGOGASTRODUODENOSCOPY (EGD) WITH PROPOFOL N/A 06/09/2015   Procedure: ESOPHAGOGASTRODUODENOSCOPY (EGD)  WITH PROPOFOL;  Surgeon: Ronald Lobo, MD;  Location: [facility] ENDOSCOPY;  Service: Endoscopy;  Laterality: N/A;  . EYE SURGERY    . FOOT FRACTURE SURGERY Left 2007  . GIVENS CAPSULE STUDY N/A 04/17/2016   Procedure: GIVENS CAPSULE STUDY;  Surgeon: Teena Irani, MD;  Location: [place];  Service: Endoscopy;  Laterality: N/A;  . KNEE ARTHROSCOPY Right   . LUMBAR LAMINECTOMY    . ROBOT ASSISTED LAPAROSCOPIC PARTIAL COLECTOMY  02/07/2016   Procedure: ROBOT ASSISTED LAPAROSCOPIC PARTIAL COLECTOMY;  Surgeon: Leighton Ruff, MD;  Location: [facility];  Service: General;;  . SHOULDER OPEN ROTATOR CUFF REPAIR    . TONSILLECTOMY AND ADENOIDECTOMY    . TOTAL ABDOMINAL HYSTERECTOMY W/ BILATERAL SALPINGOOPHORECTOMY       Current Outpatient Medications  Medication Sig Dispense Refill  . cyclobenzaprine (FLEXERIL) 5 MG tablet TAKE 1 TABLET BY MOUTH AT BEDTIME 90 tablet 3  . diltiazem (CARDIZEM CD) 180 MG 24 hr capsule TAKE 1 CAPSULE (180 MG TOTAL) BY MOUTH DAILY. 90 capsule 3  . dorzolamide (TRUSOPT) 2 % ophthalmic solution Place 1 drop into both eyes 2 (two) times daily.    . dorzolamide-timolol (COSOPT) 22.3-6.8 MG/ML ophthalmic solution     . ELIQUIS 5 MG TABS tablet TAKE 1 TABLET (5 MG TOTAL) BY MOUTH 2 (TWO) TIMES DAILY. 180 tablet 1  . fenofibrate (TRICOR) 145 MG tablet Take 145 mg by mouth at bedtime.   0  . FERREX 150 150 MG capsule     . metFORMIN (GLUCOPHAGE-XR) 500 MG 24 hr tablet Take 500 mg by mouth every evening.     . methylPREDNISolone (MEDROL DOSEPAK) 4 MG TBPK tablet follow package directions 21 tablet 0  . omeprazole-sodium bicarbonate (ZEGERID) 40-1100 MG capsule Take 1 capsule by mouth daily before breakfast.    . ondansetron (ZOFRAN) 4 MG tablet Take 4 mg by mouth every 8 (eight) hours as needed for nausea or vomiting.    . pregabalin (LYRICA) 150 MG capsule Take 150-300 mg by mouth 2 (two) times daily. Pt takes one capsule every morning and two at bedtime.    . timolol (BETIMOL) 0.5 %  ophthalmic solution Place 1 drop into both eyes 2 (two) times daily.     . tizanidine (ZANAFLEX) 2 MG capsule Take 1 capsule (2 mg total) 3 (three) times daily by mouth. 90 capsule 11  . traMADol (ULTRAM) 50 MG tablet Take 1-2 tablets (50-100 mg total) by mouth every 6 (six) hours as needed for moderate pain or severe pain. 30 tablet 0  . traZODone (DESYREL) 50 MG tablet Take 25-50 mg by mouth at bedtime as needed for sleep.    Marland Kitchen triamcinolone cream (KENALOG) 0.1 %      No current facility-administered medications for this visit.     Allergies:   Morphine; Oxycodone-aspirin; Codeine; Hydrocodone; Morphine and related; Oxycodone; Pilocarpine; and Rivaroxaban    Social History:  The patient  reports that she has quit smoking. Her smoking use included cigarettes. she has never used smokeless tobacco.  She reports that she does not drink alcohol or use drugs.   Family History:  The patient's family history includes Heart disease in her unknown relative; Lymphoma in her daughter; Migraines in her daughter and mother; Scleroderma in her sister; Sjogren's syndrome in her brother, brother, and father; Stomach cancer in her father; Stroke in her mother and unknown relative.    ROS: All other systems are reviewed and negative. Unless otherwise mentioned in H&P    PHYSICAL EXAM: VS:  BP 120/82   Pulse 99   Ht 5' (1.524 m)   Wt 182 lb (82.6 kg)   BMI 35.54 kg/m  , BMI Body mass index is 35.54 kg/m. GEN: Well nourished, well developed, in no acute distress  HEENT: normal  Neck: no JVD, carotid bruits, or masses Cardiac: IRRR; no murmurs, rubs, or gallops R>L pretibial edema. With 2+ edema in the right foot, compared to 1+ edema in the left foot.  Respiratory: Mild crackles in the bases, but relieved with cough.    GI: soft, nontender, nondistended, + BS MS: no deformity or atrophy  Skin: warm and dry, no rash. Red blotchy skin lesions noted pretibial in the proximal area.  Neuro:  Strength and  sensation are intact Psych: euthymic mood, full affect   Recent Labs: 04/02/2017: BUN 14; Creatinine, Ser 0.99; Hemoglobin 16.2; Platelets 255; Potassium 4.6; Sodium 137    Lipid Panel    Component Value Date/Time   CHOL 129 07/17/2009 0845   TRIG 128.0 07/17/2009 0845   HDL 47.20 07/17/2009 0845   CHOLHDL 3 07/17/2009 0845   VLDL 25.6 07/17/2009 0845   LDLCALC 56 07/17/2009 0845      Wt Readings from Last 3 Encounters:  06/03/17 182 lb (82.6 kg)  04/22/17 176 lb 7 oz (80 kg)  04/02/17 175 lb 12.8 oz (79.7 kg)      Other studies Reviewed: Echo 11/16/2014 Study Conclusions  - Left ventricle: The cavity size was normal. Systolic function was   normal. The estimated ejection fraction was in the range of 55%   to 60%. Wall motion was normal; there were no regional wall   motion abnormalities.  NM Stress 11/16/2014 Overall Study Impression Myocardial perfusion is normal. The study is normal. This is a low risk study. Overall left ventricular systolic function was normal. LV cavity size is normal. Nuclear stress EF: 65%. The left ventricular ejection fraction is normal (55-65%).      ASSESSMENT AND PLAN:  1. New onset LEE: Patient has had worsening symptoms for the last 3 weeks. She has been on Lasix, (she believes 40 mg although uncertain) or 7 days with improvement but not limitation of lower extremity edema. Right leg is greater than left leg concerning her edema, but pretibial edema is noted in both. I doubt that she has DVT being on ELIQUIS twice a day, but will check vascular ultrasound to evaluate for venous insufficiency. She is also on Medrol Dosepak which may also causing some fluid retention.  The patient is advised not to wear the knee-high TED hose as the band across the top is cutting into her skin. I have suggested support hose. There are hose available which have a zipper along the medial side, like a women's boot, which I have explained to her. This may be  easier to place and wear. Continue Lasix 20 mg daily. Going to check a BMET to evaluate kidney function and echocardiogram to evaluate changes in LV function causing fluid retention. Request copies of  labs from Dr. Orest Dikes office taken one week ago  She is advised a low-sodium diet and to keep her legs elevated as much as possible. It does appear to be dependent edema at this time as she states her legs look some better when she has been elevated. They are sore to touch.  2. Atrial fibrillation. She remains on diltiazem and ELIQUIS if heart rate control. Diltiazem may be causing some of her fluid retention in dependent position, but she has been taking this for a while with no increased dosing or edema until this time. As stated above will repeat echocardiogram for changes in LV function.  3. Hypertriglyceridemia: Remains on fenofibrate.   Current medicines are reviewed at length with the patient today.    Labs/ tests ordered today include: Lower extremity venous Doppler ultrasound, echocardiogram, BMET   Phill Myron. West Pugh, ANP, AACC   06/03/2017 9:12 AM    [place]  [address] [place], [place] [address] Phone: [phone]; Fax: [phone]

## 2017-06-10 NOTE — Progress Notes (Signed)
HPI: FU atrial fibrillation. She has a hx of Sjogren's syndrome, seizure disorder, HTN, HL, peripheral neuropathy. She was admitted 9/14 after presenting with overall functional decline, cough and generalized weakness and malaise. She was found to be in atrial fibrillation with RVR. CHADS2-VASc=4. She was placed on Xarelto for anticoagulation. Echo (12/07/12): Moderate focal basal hypertrophy of the septum, vigorous LV function, EF 65-70%, indeterminate diastolic function, normal wall motion, trivial MR, moderate LAE, trivial TR. She converted to sinus rhythm. MRI in October of 2014 showed a very small acute/subacute left thalamic infarct. Patient found to be in recurrent atrial fibrillation 8/16. Nuclear study 8/16 showed EF 65 and no ischemia. Patient declined cardioversion previously and her amiodarone was discontinued. Holter monitor February 2017 showed atrial fibrillation with PVCs or aberrantly conducted beats rate controlled. Showed 1-39% bilateral stenosis. Patient seen recently for worsening lower ext edema.  Echocardiogram March 2019 showed .  Lower extremity Dopplers March 2019 showed normal LV function with proximal septal thickening.  There was mild mitral regurgitation, moderate left atrial enlargement and mild tricuspid regurgitation. Pt continued on lasix 20 mg daily and compression hose recommended. Since last seen,she occasionally has dyspnea.  No chest pain, palpitations or syncope.  Minimal pedal edema.  Current Outpatient Medications  Medication Sig Dispense Refill  . cyclobenzaprine (FLEXERIL) 5 MG tablet TAKE 1 TABLET BY MOUTH AT BEDTIME 90 tablet 3  . diltiazem (CARDIZEM CD) 180 MG 24 hr capsule TAKE 1 CAPSULE (180 MG TOTAL) BY MOUTH DAILY. 90 capsule 3  . dorzolamide (TRUSOPT) 2 % ophthalmic solution Place 1 drop into both eyes 2 (two) times daily.    . dorzolamide-timolol (COSOPT) 22.3-6.8 MG/ML ophthalmic solution     . ELIQUIS 5 MG TABS tablet TAKE 1 TABLET (5 MG  TOTAL) BY MOUTH 2 (TWO) TIMES DAILY. 180 tablet 1  . fenofibrate (TRICOR) 145 MG tablet Take 145 mg by mouth at bedtime.   0  . FERREX 150 150 MG capsule     . furosemide (LASIX) 20 MG tablet Take 1 tablet (20 mg total) by mouth daily. 30 tablet 3  . metFORMIN (GLUCOPHAGE-XR) 500 MG 24 hr tablet Take 500 mg by mouth every evening.     . methylPREDNISolone (MEDROL DOSEPAK) 4 MG TBPK tablet follow package directions 21 tablet 0  . omeprazole-sodium bicarbonate (ZEGERID) 40-1100 MG capsule Take 1 capsule by mouth daily before breakfast.    . ondansetron (ZOFRAN) 4 MG tablet Take 4 mg by mouth every 8 (eight) hours as needed for nausea or vomiting.    . pregabalin (LYRICA) 150 MG capsule Take 150-300 mg by mouth 2 (two) times daily. Pt takes one capsule every morning and two at bedtime.    . timolol (BETIMOL) 0.5 % ophthalmic solution Place 1 drop into both eyes 2 (two) times daily.     . tizanidine (ZANAFLEX) 2 MG capsule Take 1 capsule (2 mg total) 3 (three) times daily by mouth. 90 capsule 11  . traMADol (ULTRAM) 50 MG tablet Take 1-2 tablets (50-100 mg total) by mouth every 6 (six) hours as needed for moderate pain or severe pain. 30 tablet 0  . traZODone (DESYREL) 50 MG tablet Take 25-50 mg by mouth at bedtime as needed for sleep.    Marland Kitchen triamcinolone cream (KENALOG) 0.1 %      No current facility-administered medications for this visit.      Past Medical History:  Diagnosis Date  . Acute respiratory failure with hypoxia ([place]) 12/06/2012  .  Allergy   . Blindness of right eye   . Complication of anesthesia   . Dehydration with hyponatremia 12/07/2012  . Diabetes mellitus without complication ([place])    diagnosed 2 weeks ago- No CBG meter as of yet.  Marland Kitchen DIVERTICULITIS, HX OF 01/27/2009   Qualifier: Diagnosis of  By: Jerold Coombe    . Gait disorder 06/05/2012  . GERD (gastroesophageal reflux disease)   . Glaucoma   . Glucose intolerance (impaired glucose tolerance)   . History of  diverticulitis   . Hypertension   . Legally blind   . Migraine triggered seizures ([place])    history of  . Migraines   . Neuropathy   . OP (osteoporosis)   . Osteoarthritis    osteoarthritis -right knee. uses walker. Left shoulder -"Limited ROM"  . Paroxysmal atrial fibrillation ([place]) 12/06/2012   Echo (12/07/12): Moderate focal basal hypertrophy of the septum, vigorous LV function, EF 65-70%, indeterminate diastolic function, normal wall motion, trivial MR, moderate LAE, trivial TR;  Xarelto started 11/2012  . Peripheral neuropathy    neuropathy  . Pneumonia   . PONV (postoperative nausea and vomiting)    no problems other past few surgeries  . SHINGLES, HX OF 07/17/2009   Qualifier: Diagnosis of  By: Jerold Coombe    . Siriasis   . Sjogren's syndrome ([place])    bilateral vision  . Sleep apnea    does not wear CPAP  . Stroke [facility])    residual - rare Intermittent expressive aphasia,    Past Surgical History:  Procedure Laterality Date  . BONE TUMOR EXCISION Right    arm  . CATARACT EXTRACTION    . COLONOSCOPY WITH PROPOFOL N/A 06/09/2015   Procedure: COLONOSCOPY WITH PROPOFOL;  Surgeon: Ronald Lobo, MD;  Location: [place];  Service: Endoscopy;  Laterality: N/A;  . DILATION AND CURETTAGE OF UTERUS    . ESOPHAGOGASTRODUODENOSCOPY N/A 04/17/2016   Procedure: ESOPHAGOGASTRODUODENOSCOPY (EGD);  Surgeon: Teena Irani, MD;  Location: [facility] ENDOSCOPY;  Service: Endoscopy;  Laterality: N/A;  . ESOPHAGOGASTRODUODENOSCOPY (EGD) WITH PROPOFOL N/A 06/09/2015   Procedure: ESOPHAGOGASTRODUODENOSCOPY (EGD) WITH PROPOFOL;  Surgeon: Ronald Lobo, MD;  Location: [facility] ENDOSCOPY;  Service: Endoscopy;  Laterality: N/A;  . EYE SURGERY    . FOOT FRACTURE SURGERY Left 2007  . GIVENS CAPSULE STUDY N/A 04/17/2016   Procedure: GIVENS CAPSULE STUDY;  Surgeon: Teena Irani, MD;  Location: [place];  Service: Endoscopy;  Laterality: N/A;  . KNEE ARTHROSCOPY Right   . LUMBAR LAMINECTOMY    . ROBOT ASSISTED  LAPAROSCOPIC PARTIAL COLECTOMY  02/07/2016   Procedure: ROBOT ASSISTED LAPAROSCOPIC PARTIAL COLECTOMY;  Surgeon: Leighton Ruff, MD;  [facility];  Service: General;;  . SHOULDER OPEN ROTATOR CUFF REPAIR    . TONSILLECTOMY AND ADENOIDECTOMY    . TOTAL ABDOMINAL HYSTERECTOMY W/ BILATERAL SALPINGOOPHORECTOMY      Social History   Socioeconomic History  . Marital status: Married    Spouse name: jesse  . Number of children: 2  . Years of education: college  . Highest education level: Not on file  Occupational History  . Occupation: retired  Scientific laboratory technician  . Financial resource strain: Not on file  . Food insecurity:    Worry: Not on file    Inability: Not on file  . Transportation needs:    Medical: Not on file    Non-medical: Not on file  Tobacco Use  . Smoking status: Former Smoker    Types: Cigarettes  . Smokeless tobacco:  Never Used  . Tobacco comment: quit smoking before age of 18  Substance and Sexual Activity  . Alcohol use: No    Alcohol/week: 0.0 oz  . Drug use: No  . Sexual activity: Not on file  Lifestyle  . Physical activity:    Days per week: Not on file    Minutes per session: Not on file  . Stress: Not on file  Relationships  . Social connections:    Talks on phone: Not on file    Gets together: Not on file    Attends religious service: Not on file    Active member of club or organization: Not on file    Attends meetings of clubs or organizations: Not on file    Relationship status: Not on file  . Intimate partner violence:    Fear of current or ex partner: Not on file    Emotionally abused: Not on file    Physically abused: Not on file    Forced sexual activity: Not on file  Other Topics Concern  . Not on file  Social History Narrative   She lives with husband of 83 years.  They have a one story home.  One daughter.     Family History  Problem Relation Age of Onset  . Stroke Mother   . Migraines Mother   . Stomach cancer Father   .  Sjogren's syndrome Father   . Sjogren's syndrome Brother   . Sjogren's syndrome Brother   . Scleroderma Sister        raynaud's scleroderma  . Lymphoma Daughter   . Migraines Daughter   . Heart disease Unknown        maternal side  . Stroke Unknown        maternal side, 1st degree female <60    ROS: Arthralgias but no fevers or chills, productive cough, hemoptysis, dysphasia, odynophagia, melena, hematochezia, dysuria, hematuria, rash, seizure activity, orthopnea, PND. Remaining systems are negative.  Physical Exam: Well-developed obese, chronically ill appearing in no acute distress.  Skin is warm and dry.  HEENT is normal.  Neck is supple.  Chest is clear to auscultation with normal expansion.  Cardiovascular exam is irregular Abdominal exam nontender or distended. No masses palpated. Extremities show trace edema. neuro grossly intact   A/P  1 permanent atrial fibrillation-plan to continue Cardizem for rate control.  Continue apixaban.  Patient declined cardioversion previously.  2 hypertension-pressure is controlled.  Continue present medications.  3 hyperlipidemia-managed by primary care.  4 chronic diastolic congestive heart failure-patient doing well from a symptomatic standpoint.  Continue present dose of Lasix.  Continue low-sodium diet and fluid restriction.   Kirk Ruths, MD

## 2017-06-11 ENCOUNTER — Other Ambulatory Visit: Payer: Self-pay

## 2017-06-11 ENCOUNTER — Ambulatory Visit (HOSPITAL_COMMUNITY): Payer: Medicare Other | Attending: Internal Medicine

## 2017-06-11 DIAGNOSIS — R600 Localized edema: Secondary | ICD-10-CM

## 2017-06-11 DIAGNOSIS — I083 Combined rheumatic disorders of mitral, aortic and tricuspid valves: Secondary | ICD-10-CM | POA: Diagnosis not present

## 2017-06-11 DIAGNOSIS — I519 Heart disease, unspecified: Secondary | ICD-10-CM

## 2017-06-11 DIAGNOSIS — I420 Dilated cardiomyopathy: Secondary | ICD-10-CM | POA: Insufficient documentation

## 2017-06-12 DIAGNOSIS — I872 Venous insufficiency (chronic) (peripheral): Secondary | ICD-10-CM | POA: Diagnosis not present

## 2017-06-13 ENCOUNTER — Ambulatory Visit (HOSPITAL_COMMUNITY)
Admission: RE | Admit: 2017-06-13 | Discharge: 2017-06-13 | Disposition: A | Discharge status: Home / Self Care | Payer: Medicare Other | Source: Ambulatory Visit | Attending: Cardiology | Admitting: Cardiology

## 2017-06-13 DIAGNOSIS — R600 Localized edema: Secondary | ICD-10-CM

## 2017-06-13 DIAGNOSIS — E785 Hyperlipidemia, unspecified: Secondary | ICD-10-CM | POA: Diagnosis not present

## 2017-06-13 DIAGNOSIS — I519 Heart disease, unspecified: Secondary | ICD-10-CM | POA: Diagnosis not present

## 2017-06-13 DIAGNOSIS — D500 Iron deficiency anemia secondary to blood loss (chronic): Secondary | ICD-10-CM | POA: Diagnosis not present

## 2017-06-13 DIAGNOSIS — I480 Paroxysmal atrial fibrillation: Secondary | ICD-10-CM | POA: Diagnosis not present

## 2017-06-13 DIAGNOSIS — M47816 Spondylosis without myelopathy or radiculopathy, lumbar region: Secondary | ICD-10-CM | POA: Diagnosis not present

## 2017-06-13 DIAGNOSIS — D509 Iron deficiency anemia, unspecified: Secondary | ICD-10-CM | POA: Diagnosis not present

## 2017-06-13 DIAGNOSIS — E1143 Type 2 diabetes mellitus with diabetic autonomic (poly)neuropathy: Secondary | ICD-10-CM | POA: Diagnosis not present

## 2017-06-13 DIAGNOSIS — I4891 Unspecified atrial fibrillation: Secondary | ICD-10-CM | POA: Diagnosis not present

## 2017-06-23 ENCOUNTER — Ambulatory Visit (INDEPENDENT_AMBULATORY_CARE_PROVIDER_SITE_OTHER): Payer: Medicare Other | Admitting: Cardiology

## 2017-06-23 ENCOUNTER — Encounter: Payer: Self-pay | Admitting: Cardiology

## 2017-06-23 VITALS — BP 124/80 | HR 87 | Ht 60.0 in | Wt 180.0 lb

## 2017-06-23 DIAGNOSIS — I482 Chronic atrial fibrillation, unspecified: Secondary | ICD-10-CM

## 2017-06-23 DIAGNOSIS — E7800 Pure hypercholesterolemia, unspecified: Secondary | ICD-10-CM | POA: Diagnosis not present

## 2017-06-23 DIAGNOSIS — I10 Essential (primary) hypertension: Secondary | ICD-10-CM

## 2017-06-23 NOTE — Patient Instructions (Signed)
Your physician wants you to follow-up in: 6 MONTHS WITH DR CRENSHAW You will receive a reminder letter in the mail two months in advance. If you don't receive a letter, please call our office to schedule the follow-up appointment.   If you need a refill on your cardiac medications before your next appointment, please call your pharmacy.  

## 2017-07-09 DIAGNOSIS — E785 Hyperlipidemia, unspecified: Secondary | ICD-10-CM | POA: Diagnosis not present

## 2017-07-09 DIAGNOSIS — I4891 Unspecified atrial fibrillation: Secondary | ICD-10-CM | POA: Diagnosis not present

## 2017-07-09 DIAGNOSIS — M47816 Spondylosis without myelopathy or radiculopathy, lumbar region: Secondary | ICD-10-CM | POA: Diagnosis not present

## 2017-07-09 DIAGNOSIS — E1143 Type 2 diabetes mellitus with diabetic autonomic (poly)neuropathy: Secondary | ICD-10-CM | POA: Diagnosis not present

## 2017-07-09 DIAGNOSIS — D509 Iron deficiency anemia, unspecified: Secondary | ICD-10-CM | POA: Diagnosis not present

## 2017-07-09 DIAGNOSIS — D500 Iron deficiency anemia secondary to blood loss (chronic): Secondary | ICD-10-CM | POA: Diagnosis not present

## 2017-07-10 DIAGNOSIS — R110 Nausea: Secondary | ICD-10-CM | POA: Diagnosis not present

## 2017-07-10 DIAGNOSIS — R1084 Generalized abdominal pain: Secondary | ICD-10-CM | POA: Diagnosis not present

## 2017-07-22 DIAGNOSIS — E860 Dehydration: Secondary | ICD-10-CM | POA: Diagnosis not present

## 2017-07-24 DIAGNOSIS — I8312 Varicose veins of left lower extremity with inflammation: Secondary | ICD-10-CM | POA: Diagnosis not present

## 2017-07-24 DIAGNOSIS — I8311 Varicose veins of right lower extremity with inflammation: Secondary | ICD-10-CM | POA: Diagnosis not present

## 2017-08-14 DIAGNOSIS — M47816 Spondylosis without myelopathy or radiculopathy, lumbar region: Secondary | ICD-10-CM | POA: Diagnosis not present

## 2017-08-14 DIAGNOSIS — D509 Iron deficiency anemia, unspecified: Secondary | ICD-10-CM | POA: Diagnosis not present

## 2017-08-14 DIAGNOSIS — D500 Iron deficiency anemia secondary to blood loss (chronic): Secondary | ICD-10-CM | POA: Diagnosis not present

## 2017-08-14 DIAGNOSIS — I480 Paroxysmal atrial fibrillation: Secondary | ICD-10-CM | POA: Diagnosis not present

## 2017-08-14 DIAGNOSIS — E1143 Type 2 diabetes mellitus with diabetic autonomic (poly)neuropathy: Secondary | ICD-10-CM | POA: Diagnosis not present

## 2017-08-14 DIAGNOSIS — E785 Hyperlipidemia, unspecified: Secondary | ICD-10-CM | POA: Diagnosis not present

## 2017-08-15 DIAGNOSIS — J209 Acute bronchitis, unspecified: Secondary | ICD-10-CM | POA: Diagnosis not present

## 2017-08-18 DIAGNOSIS — H401133 Primary open-angle glaucoma, bilateral, severe stage: Secondary | ICD-10-CM | POA: Diagnosis not present

## 2017-08-18 DIAGNOSIS — H18893 Other specified disorders of cornea, bilateral: Secondary | ICD-10-CM | POA: Diagnosis not present

## 2017-08-28 DIAGNOSIS — E559 Vitamin D deficiency, unspecified: Secondary | ICD-10-CM | POA: Diagnosis not present

## 2017-08-28 DIAGNOSIS — I480 Paroxysmal atrial fibrillation: Secondary | ICD-10-CM | POA: Diagnosis not present

## 2017-08-28 DIAGNOSIS — R609 Edema, unspecified: Secondary | ICD-10-CM | POA: Diagnosis not present

## 2017-08-28 DIAGNOSIS — E1143 Type 2 diabetes mellitus with diabetic autonomic (poly)neuropathy: Secondary | ICD-10-CM | POA: Diagnosis not present

## 2017-08-28 DIAGNOSIS — E785 Hyperlipidemia, unspecified: Secondary | ICD-10-CM | POA: Diagnosis not present

## 2017-08-28 DIAGNOSIS — R05 Cough: Secondary | ICD-10-CM | POA: Diagnosis not present

## 2017-08-28 DIAGNOSIS — R350 Frequency of micturition: Secondary | ICD-10-CM | POA: Diagnosis not present

## 2017-08-28 DIAGNOSIS — G4700 Insomnia, unspecified: Secondary | ICD-10-CM | POA: Diagnosis not present

## 2017-09-05 ENCOUNTER — Ambulatory Visit
Admission: RE | Admit: 2017-09-05 | Discharge: 2017-09-05 | Disposition: A | Discharge status: Home / Self Care | Payer: Medicare Other | Source: Ambulatory Visit | Attending: Family Medicine | Admitting: Family Medicine

## 2017-09-05 ENCOUNTER — Other Ambulatory Visit: Payer: Self-pay | Admitting: Family Medicine

## 2017-09-05 DIAGNOSIS — R05 Cough: Secondary | ICD-10-CM

## 2017-09-05 DIAGNOSIS — R059 Cough, unspecified: Secondary | ICD-10-CM

## 2017-09-09 DIAGNOSIS — I8311 Varicose veins of right lower extremity with inflammation: Secondary | ICD-10-CM | POA: Diagnosis not present

## 2017-09-09 DIAGNOSIS — I8312 Varicose veins of left lower extremity with inflammation: Secondary | ICD-10-CM | POA: Diagnosis not present

## 2017-09-11 DIAGNOSIS — D500 Iron deficiency anemia secondary to blood loss (chronic): Secondary | ICD-10-CM | POA: Diagnosis not present

## 2017-09-11 DIAGNOSIS — I4891 Unspecified atrial fibrillation: Secondary | ICD-10-CM | POA: Diagnosis not present

## 2017-09-11 DIAGNOSIS — M47816 Spondylosis without myelopathy or radiculopathy, lumbar region: Secondary | ICD-10-CM | POA: Diagnosis not present

## 2017-09-11 DIAGNOSIS — D509 Iron deficiency anemia, unspecified: Secondary | ICD-10-CM | POA: Diagnosis not present

## 2017-09-11 DIAGNOSIS — E785 Hyperlipidemia, unspecified: Secondary | ICD-10-CM | POA: Diagnosis not present

## 2017-09-11 DIAGNOSIS — E1143 Type 2 diabetes mellitus with diabetic autonomic (poly)neuropathy: Secondary | ICD-10-CM | POA: Diagnosis not present

## 2017-09-11 DIAGNOSIS — I480 Paroxysmal atrial fibrillation: Secondary | ICD-10-CM | POA: Diagnosis not present

## 2017-09-17 DIAGNOSIS — H18893 Other specified disorders of cornea, bilateral: Secondary | ICD-10-CM | POA: Diagnosis not present

## 2017-09-17 DIAGNOSIS — Z961 Presence of intraocular lens: Secondary | ICD-10-CM | POA: Diagnosis not present

## 2017-09-17 DIAGNOSIS — H02403 Unspecified ptosis of bilateral eyelids: Secondary | ICD-10-CM | POA: Diagnosis not present

## 2017-09-17 DIAGNOSIS — M3501 Sicca syndrome with keratoconjunctivitis: Secondary | ICD-10-CM | POA: Diagnosis not present

## 2017-09-26 DIAGNOSIS — M1711 Unilateral primary osteoarthritis, right knee: Secondary | ICD-10-CM | POA: Diagnosis not present

## 2017-09-26 DIAGNOSIS — M1712 Unilateral primary osteoarthritis, left knee: Secondary | ICD-10-CM | POA: Diagnosis not present

## 2017-10-10 ENCOUNTER — Ambulatory Visit (INDEPENDENT_AMBULATORY_CARE_PROVIDER_SITE_OTHER): Payer: Medicare Other | Admitting: Neurology

## 2017-10-10 ENCOUNTER — Encounter: Payer: Self-pay | Admitting: Neurology

## 2017-10-10 VITALS — BP 104/70 | HR 75 | Ht 60.0 in | Wt 179.5 lb

## 2017-10-10 DIAGNOSIS — R251 Tremor, unspecified: Secondary | ICD-10-CM

## 2017-10-10 DIAGNOSIS — G63 Polyneuropathy in diseases classified elsewhere: Secondary | ICD-10-CM

## 2017-10-10 DIAGNOSIS — M3509 Sicca syndrome with other organ involvement: Secondary | ICD-10-CM

## 2017-10-10 DIAGNOSIS — G44221 Chronic tension-type headache, intractable: Secondary | ICD-10-CM | POA: Diagnosis not present

## 2017-10-10 DIAGNOSIS — M3506 Sjogren syndrome with peripheral nervous system involvement: Secondary | ICD-10-CM

## 2017-10-10 NOTE — Progress Notes (Signed)
Follow-up Visit   Date: 10/10/17    MISCHELLE Clarke MRN: [id number] DOB: 07-Aug-1935   [name] is a 82 y.o. right-handed Caucasian female with hypertension, Sjogren's syndrome (diagnosed 4970) complicated by neuropathy and vision impairment, migraines, atrial fibrillation (on apixiban), history of shingles affecting left T6 (07/2009) and stroke (September 2014, residual word-finding difficulty) returning to the clinic for follow-up visit for neuropathy.   History of present illness: She was under the care of Dr. Erling Cruz at [facility] for a over 30 years for migraines, stroke, peripheral neuropathy, headaches, and gait instability. She has a long history of migraines or started at the age of 9. Her headaches were well controlled on Depakote 250 twice a day. However, it was stopped due to low platelets and anticoagulation. Previously tried medication for headaches is depakote, Botox (2 trials without benefit) and maxalt. Being on apixiban, she is unable to take NSAIDs due to bleeding risk.  She was given a prednisone taper dose with dramatic improvement of headaches and has been relatively well since then.   She was admitted to the hospital in September 2014 for community-acquired pneumonia and found to have new onset atrial fibrillation. Due to worsening headaches, MRI of the brain was obtained in October which showed a small left thalamic subacute stroke.  In 2015, she was started on Lyrica 150-300mg  for headaches and neuropathy and responded well.    In 2017, she was having sneezing spells with shaking of the arms.  This was evaluated with EEG which was normal, this became worse again in 2018, again EEG was negative. She was hospitalized in November 2018 with colovesical fistula s/p robotic colectomy & bladder closure 02/07/2016.  UPDATE 10/10/2017:  She is here for follow-up visit. Overall, she feels that there is a gradual decline in general health.  She is aware she is  getting older and much of her medical conditions cannot be cured.  She had severe bout of bronchitis several weeks and developed lingering cough.  Patient tells me that due to her chronic cough, her Lyrica was reduced to 100mg  twice daily.  Since lowering the dose, her neuropathy, stinging pain in the head, and sneezing spells are much worse.   Medications:  Current Outpatient Medications on File Prior to Visit  Medication Sig Dispense Refill  . cephALEXin (KEFLEX) 500 MG capsule cephalexin 500 mg capsule    . cyclobenzaprine (FLEXERIL) 5 MG tablet TAKE 1 TABLET BY MOUTH AT BEDTIME 90 tablet 3  . diltiazem (CARDIZEM CD) 180 MG 24 hr capsule TAKE 1 CAPSULE (180 MG TOTAL) BY MOUTH DAILY. 90 capsule 3  . dorzolamide (TRUSOPT) 2 % ophthalmic solution Place 1 drop into both eyes 2 (two) times daily.    . dorzolamide-timolol (COSOPT) 22.3-6.8 MG/ML ophthalmic solution     . ELIQUIS 5 MG TABS tablet TAKE 1 TABLET (5 MG TOTAL) BY MOUTH 2 (TWO) TIMES DAILY. 180 tablet 1  . fenofibrate (TRICOR) 145 MG tablet Take 145 mg by mouth at bedtime.   0  . FERREX 150 150 MG capsule     . ferrous sulfate 325 (65 FE) MG tablet Take 325 mg by mouth 2 (two) times daily.  12  . lisinopril (PRINIVIL,ZESTRIL) 2.5 MG tablet lisinopril 2.5 mg tablet    . LYRICA 100 MG capsule Take 100 mg by mouth 2 (two) times daily.  1  . metFORMIN (GLUCOPHAGE-XR) 500 MG 24 hr tablet Take 500 mg by mouth every evening.     Marland Kitchen  omeprazole-sodium bicarbonate (ZEGERID) 40-1100 MG capsule Take 1 capsule by mouth daily before breakfast.    . ondansetron (ZOFRAN) 8 MG tablet TAKE ONE TABLET EVERY 6 HOURS AS NEEDED FOR NAUSEA ORALLY 10 DAYS  0  . sucralfate (CARAFATE) 1 g tablet sucralfate 1 gram tablet    . timolol (BETIMOL) 0.5 % ophthalmic solution Place 1 drop into both eyes 2 (two) times daily.     . tizanidine (ZANAFLEX) 2 MG capsule Take 1 capsule (2 mg total) 3 (three) times daily by mouth. 90 capsule 11  . traMADol (ULTRAM) 50 MG tablet  Take 1-2 tablets (50-100 mg total) by mouth every 6 (six) hours as needed for moderate pain or severe pain. 30 tablet 0  . traZODone (DESYREL) 50 MG tablet Take 25-50 mg by mouth at bedtime as needed for sleep.    Marland Kitchen triamcinolone cream (KENALOG) 0.1 %     . furosemide (LASIX) 20 MG tablet Take 1 tablet (20 mg total) by mouth daily. 30 tablet 3   No current facility-administered medications on file prior to visit.     Allergies:  Allergies  Allergen Reactions  . Morphine   . Oxycodone-Aspirin Nausea And Vomiting  . Codeine Nausea And Vomiting  . Hydrocodone Nausea And Vomiting  . Morphine And Related Nausea And Vomiting  . Oxycodone Nausea And Vomiting  . Pilocarpine Other (See Comments)    Reaction:  Unknown   . Rivaroxaban Other (See Comments)    Reaction:  Back pain/headaches      Review of Systems:  CONSTITUTIONAL: No fevers, chills, night sweats, no weight loss.   EYES: +visual changes or eye pain ENT: No hearing changes.  No history of nose bleeds.   RESPIRATORY: No cough, wheezing and shortness of breath.   CARDIOVASCULAR: Negative for chest pain, and palpitations.   GI: No abdominal discomfort, blood in stools or black stools.  No recent change in bowel habits.   GU:  No history of incontinence.  MUSCLOSKELETAL: +history of joint pain or swelling.  No myalgias.   SKIN: No for lesions, rash, and itching.   ENDOCRINE: Negative for cold or heat intolerance, polydipsia or goiter.   PSYCH:  No depression or anxiety symptoms.   NEURO: As Above.   Vital Signs:  BP 104/70   Pulse 75   Ht 5' (1.524 m)   Wt 179 lb 8 oz (81.4 kg)   SpO2 95%   BMI 35.06 kg/m   Neurological Exam: MENTAL STATUS including orientation to time, place, person, recent and remote memory, attention span and concentration, language, and fund of knowledge is fair.   Speech is not dysarthric.    CRANIAL NERVES:  Severe visual impairment bilaterally, blind in the right eye, gross movements in the  left.  Mild bilateral ptosis. Face is symmetric. Dry oral mucosa and tongue.     MOTOR: Motor strength is 5-/5 proximally.  Left dorsiflexion and toe extension is 4/5.     SENSORY:  Vibration is intact at the knees, absent distal to ankles.  COORDINATION/GAIT: Gait is moderately wide-based with small, short steps assisted with walker.   Data: MRI brain wo contrast 12/23/2012: Very small acute/ subacute left thalamic nonhemorrhagic infarct. Prominent small vessel disease type changes. No intracranial hemorrhage. Mild atrophy without hydrocephalus.  Prior work-up at [facility]: EEG 06/03/1995, 08/31/2008, 05/25/2015: normal  CSF 1991: normal  Ambulatory EEG 03/24/2017:  Normal  Lab Results  Component Value Date   VITAMINB12 260 04/12/2016   Lab Results  Component  Value Date   TSH 0.834 12/06/2012    IMPRESSION/PLAN:   1.  Peripheral neuropathy due to Sjogren's disease, involving stocking-glove distribution.   - Pain was previously controlled on Lyrica 150mg  BID, and since lowering to 100mg  BID has paresthesias are more painful   - I have asked patient to contact PCP's office to see whether it can be increased back to Lyrica 150mg  BID  2.  Tension headaches, continue tizanidine 2mg  TID as needed  3. Migraine without aura - resolved  - Previously used:  VPA (stopped due to low PLT), triptans (stroke), flexeril (no improvement), botox, medrol dose pack (effective)  4.  Spells of sneezing with associated abnormal hand movements of the right arm, unclear etiology.  Ambulatory EEG is normal.  Patient reassured these movements are not epileptic.  5.  History of left thalamic stroke, likely cardioembolic due to afib on apixiban with residual expressive aphasia  6. Age-related memory loss possibly transitioning into vascular dementia.  Stable.   Return to clinic in 6 months  Greater than 50% of this 20 minute visit was spent in counseling, explanation of diagnosis, planning of further management,  and coordination of care.   Thank you for allowing me to participate in patient's care.  If I can answer any additional questions, I would be pleased to do so.    Sincerely,    Donika K. Posey Pronto, DO

## 2017-10-13 DIAGNOSIS — I4891 Unspecified atrial fibrillation: Secondary | ICD-10-CM | POA: Diagnosis not present

## 2017-10-13 DIAGNOSIS — M47816 Spondylosis without myelopathy or radiculopathy, lumbar region: Secondary | ICD-10-CM | POA: Diagnosis not present

## 2017-10-13 DIAGNOSIS — E785 Hyperlipidemia, unspecified: Secondary | ICD-10-CM | POA: Diagnosis not present

## 2017-10-13 DIAGNOSIS — D509 Iron deficiency anemia, unspecified: Secondary | ICD-10-CM | POA: Diagnosis not present

## 2017-10-13 DIAGNOSIS — I480 Paroxysmal atrial fibrillation: Secondary | ICD-10-CM | POA: Diagnosis not present

## 2017-10-13 DIAGNOSIS — E1143 Type 2 diabetes mellitus with diabetic autonomic (poly)neuropathy: Secondary | ICD-10-CM | POA: Diagnosis not present

## 2017-10-13 DIAGNOSIS — D500 Iron deficiency anemia secondary to blood loss (chronic): Secondary | ICD-10-CM | POA: Diagnosis not present

## 2017-10-19 ENCOUNTER — Other Ambulatory Visit: Payer: Self-pay | Admitting: Adult Health

## 2017-10-20 DIAGNOSIS — H401133 Primary open-angle glaucoma, bilateral, severe stage: Secondary | ICD-10-CM | POA: Diagnosis not present

## 2017-10-20 DIAGNOSIS — H18893 Other specified disorders of cornea, bilateral: Secondary | ICD-10-CM | POA: Diagnosis not present

## 2017-10-23 ENCOUNTER — Telehealth: Payer: Self-pay | Admitting: Neurology

## 2017-10-23 NOTE — Telephone Encounter (Signed)
Patient called stating she wanted to talk to Caryl Pina about some changes in her health. She said that she thought she was experiencing TIA or mini strokes. She wasn't sure. Please call her at home [phone] and then if she doesn't answer call her cell at [phone]. Thanks.

## 2017-10-24 NOTE — Telephone Encounter (Signed)
I spoke with patient's husband and he thinks that maybe she has had another stroke.  She is forgetful and does not seem to comprehend things all the time.  He would like to have a scan done.  I instructed him to take her to the ER if she seems to decline over the weekend.

## 2017-10-24 NOTE — Telephone Encounter (Signed)
OK to order MRI brain wo contrast for memory changes.

## 2017-10-27 ENCOUNTER — Other Ambulatory Visit: Payer: Self-pay | Admitting: *Deleted

## 2017-10-27 DIAGNOSIS — R413 Other amnesia: Secondary | ICD-10-CM

## 2017-10-27 NOTE — Telephone Encounter (Signed)
MRI ordered. Patient notified

## 2017-11-01 ENCOUNTER — Other Ambulatory Visit: Payer: Self-pay | Admitting: Cardiology

## 2017-11-02 ENCOUNTER — Ambulatory Visit
Admission: RE | Admit: 2017-11-02 | Discharge: 2017-11-02 | Disposition: A | Discharge status: Home / Self Care | Payer: Medicare Other | Source: Ambulatory Visit | Attending: Neurology | Admitting: Neurology

## 2017-11-02 DIAGNOSIS — R413 Other amnesia: Secondary | ICD-10-CM

## 2017-11-03 ENCOUNTER — Telehealth: Payer: Self-pay | Admitting: *Deleted

## 2017-11-03 NOTE — Telephone Encounter (Signed)
-----   Message from Alda Berthold, DO sent at 11/03/2017 10:47 AM EDT ----- Please inform patient that there is no evidence of recent stroke.  There is atrophy/volume loss of the brain as compared to 2014, which is expected with age. Thanks.

## 2017-11-03 NOTE — Telephone Encounter (Signed)
Patient called and results were given.

## 2017-11-20 ENCOUNTER — Encounter: Payer: Self-pay | Admitting: Neurology

## 2017-11-22 ENCOUNTER — Other Ambulatory Visit: Payer: Self-pay | Admitting: Neurology

## 2017-11-24 DIAGNOSIS — H401133 Primary open-angle glaucoma, bilateral, severe stage: Secondary | ICD-10-CM | POA: Diagnosis not present

## 2017-11-27 DIAGNOSIS — Z23 Encounter for immunization: Secondary | ICD-10-CM | POA: Diagnosis not present

## 2017-11-27 DIAGNOSIS — G629 Polyneuropathy, unspecified: Secondary | ICD-10-CM | POA: Diagnosis not present

## 2017-11-27 DIAGNOSIS — N289 Disorder of kidney and ureter, unspecified: Secondary | ICD-10-CM | POA: Diagnosis not present

## 2017-11-27 DIAGNOSIS — M1711 Unilateral primary osteoarthritis, right knee: Secondary | ICD-10-CM | POA: Diagnosis not present

## 2017-11-27 DIAGNOSIS — R5383 Other fatigue: Secondary | ICD-10-CM | POA: Diagnosis not present

## 2017-12-30 ENCOUNTER — Encounter: Payer: Self-pay | Admitting: Cardiology

## 2018-01-08 ENCOUNTER — Other Ambulatory Visit: Payer: Self-pay | Admitting: Neurology

## 2018-01-13 NOTE — Progress Notes (Deleted)
    HPI: FU atrial fibrillation. She has a hx of Sjogren's syndrome, seizure disorder, HTN, HL, peripheral neuropathy. She was admitted 9/14 after presenting with overall functional decline, cough and generalized weakness and malaise. She was found to be in atrial fibrillation with RVR. CHADS2-VASc=4. She was placed on Xarelto for anticoagulation. Echo (12/07/12): Moderate focal basal hypertrophy of the septum, vigorous LV function, EF 65-70%, indeterminate diastolic function, normal wall motion, trivial MR, moderate LAE, trivial TR. She converted to sinus rhythm. MRI in October of 2014 showed a very small acute/subacute left thalamic infarct. Patient found to be in recurrent atrial fibrillation 8/16. Nuclear study 8/16 showed EF 65 and no ischemia. Patient declined cardioversion previously and her amiodarone was discontinued. Holter monitor February 2017 showed atrial fibrillation with PVCs or aberrantly conducted beats rate controlled. Showed 1-39% bilateral stenosis. Patient seen recently for worsening lower ext edema.  Echocardiogram March 2019 showed .  Lower extremity Dopplers March 2019 showed normal LV function with proximal septal thickening.  There was mild mitral regurgitation, moderate left atrial enlargement and mild tricuspid regurgitation. Pt continued on lasix 20 mg daily and compression hose recommended. Since last seen,   Current Outpatient Medications  Medication Sig Dispense Refill  . cephALEXin (KEFLEX) 500 MG capsule cephalexin 500 mg capsule    . cyclobenzaprine (FLEXERIL) 5 MG tablet TAKE 1 TABLET BY MOUTH AT BEDTIME 90 tablet 3  . diltiazem (CARDIZEM CD) 180 MG 24 hr capsule TAKE 1 CAPSULE (180 MG TOTAL) BY MOUTH DAILY. 90 capsule 3  . dorzolamide (TRUSOPT) 2 % ophthalmic solution Place 1 drop into both eyes 2 (two) times daily.    . dorzolamide-timolol (COSOPT) 22.3-6.8 MG/ML ophthalmic solution     . ELIQUIS 5 MG TABS tablet TAKE 1 TABLET (5 MG TOTAL) BY MOUTH 2 (TWO) TIMES  DAILY. 180 tablet 1  . fenofibrate (TRICOR) 145 MG tablet Take 145 mg by mouth at bedtime.   0  . FERREX 150 150 MG capsule     . ferrous sulfate 325 (65 FE) MG tablet Take 325 mg by mouth 2 (two) times daily.  12  . furosemide (LASIX) 20 MG tablet TAKE 1 TABLET BY MOUTH EVERY DAY 90 tablet 1  . lisinopril (PRINIVIL,ZESTRIL) 2.5 MG tablet lisinopril 2.5 mg tablet    . LYRICA 100 MG capsule Take 100 mg by mouth 2 (two) times daily.  1  . metFORMIN (GLUCOPHAGE-XR) 500 MG 24 hr tablet Take 500 mg by mouth every evening.     . omeprazole-sodium bicarbonate (ZEGERID) 40-1100 MG capsule Take 1 capsule by mouth daily before breakfast.    . ondansetron (ZOFRAN) 8 MG tablet TAKE ONE TABLET EVERY 6 HOURS AS NEEDED FOR NAUSEA ORALLY 10 DAYS  0  . sucralfate (CARAFATE) 1 g tablet sucralfate 1 gram tablet    . timolol (BETIMOL) 0.5 % ophthalmic solution Place 1 drop into both eyes 2 (two) times daily.     . tizanidine (ZANAFLEX) 2 MG capsule TAKE 1 CAPSULE (2 MG TOTAL) 3 (THREE) TIMES DAILY BY MOUTH. 90 capsule 5  . traMADol (ULTRAM) 50 MG tablet Take 1-2 tablets (50-100 mg total) by mouth every 6 (six) hours as needed for moderate pain or severe pain. 30 tablet 0  . traZODone (DESYREL) 50 MG tablet Take 25-50 mg by mouth at bedtime as needed for sleep.    . triamcinolone cream (KENALOG) 0.1 %      No current facility-administered medications for this visit.        Past Medical History:  Diagnosis Date  . Acute respiratory failure with hypoxia (HCC) 12/06/2012  . Allergy   . Blindness of right eye   . Complication of anesthesia   . Dehydration with hyponatremia 12/07/2012  . Diabetes mellitus without complication (HCC)    diagnosed 2 weeks ago- No CBG meter as of yet.  . DIVERTICULITIS, HX OF 01/27/2009   Qualifier: Diagnosis of  By: Lowne DO, Yvonne    . Gait disorder 06/05/2012  . GERD (gastroesophageal reflux disease)   . Glaucoma   . Glucose intolerance (impaired glucose tolerance)   . History  of diverticulitis   . Hypertension   . Legally blind   . Migraine triggered seizures (HCC)    history of  . Migraines   . Neuropathy   . OP (osteoporosis)   . Osteoarthritis    osteoarthritis -right knee. uses walker. Left shoulder -"Limited ROM"  . Paroxysmal atrial fibrillation (HCC) 12/06/2012   Echo (12/07/12): Moderate focal basal hypertrophy of the septum, vigorous LV function, EF 65-70%, indeterminate diastolic function, normal wall motion, trivial MR, moderate LAE, trivial TR;  Xarelto started 11/2012  . Peripheral neuropathy    neuropathy  . Pneumonia   . PONV (postoperative nausea and vomiting)    no problems other past few surgeries  . SHINGLES, HX OF 07/17/2009   Qualifier: Diagnosis of  By: Lowne DO, Yvonne    . Siriasis   . Sjogren's syndrome (HCC)    bilateral vision  . Sleep apnea    does not wear CPAP  . Stroke (HCC)    residual - rare Intermittent expressive aphasia,    Past Surgical History:  Procedure Laterality Date  . BONE TUMOR EXCISION Right    arm  . CATARACT EXTRACTION    . COLONOSCOPY WITH PROPOFOL N/A 06/09/2015   Procedure: COLONOSCOPY WITH PROPOFOL;  Surgeon: Robert Buccini, MD;  Location: [facility];  Service: Endoscopy;  Laterality: N/A;  . DILATION AND CURETTAGE OF UTERUS    . ESOPHAGOGASTRODUODENOSCOPY N/A 04/17/2016   Procedure: ESOPHAGOGASTRODUODENOSCOPY (EGD);  Surgeon: John Hayes, MD;  Location: [facility];  Service: Endoscopy;  Laterality: N/A;  . ESOPHAGOGASTRODUODENOSCOPY (EGD) WITH PROPOFOL N/A 06/09/2015   Procedure: ESOPHAGOGASTRODUODENOSCOPY (EGD) WITH PROPOFOL;  Surgeon: Robert Buccini, MD;  Location: [facility];  Service: Endoscopy;  Laterality: N/A;  . EYE SURGERY    . FOOT FRACTURE SURGERY Left 2007  . GIVENS CAPSULE STUDY N/A 04/17/2016   Procedure: GIVENS CAPSULE STUDY;  Surgeon: John Hayes, MD;  Location: [facility];  Service: Endoscopy;  Laterality: N/A;  . KNEE ARTHROSCOPY Right   . LUMBAR LAMINECTOMY    . ROBOT  ASSISTED LAPAROSCOPIC PARTIAL COLECTOMY  02/07/2016   Procedure: ROBOT ASSISTED LAPAROSCOPIC PARTIAL COLECTOMY;  Surgeon: Alicia Thomas, MD;  Location: [facility];  Service: General;;  . SHOULDER OPEN ROTATOR CUFF REPAIR    . TONSILLECTOMY AND ADENOIDECTOMY    . TOTAL ABDOMINAL HYSTERECTOMY W/ BILATERAL SALPINGOOPHORECTOMY      Social History   Socioeconomic History  . Marital status: Married    Spouse name: jesse  . Number of children: 2  . Years of education: college  . Highest education level: Not on file  Occupational History  . Occupation: retired  Social Needs  . Financial resource strain: Not on file  . Food insecurity:    Worry: Not on file    Inability: Not on file  . Transportation needs:    Medical: Not on file    Non-medical: Not on file    Tobacco Use  . Smoking status: Former Smoker    Types: Cigarettes  . Smokeless tobacco: Never Used  . Tobacco comment: quit smoking before age of 32  Substance and Sexual Activity  . Alcohol use: No    Alcohol/week: 0.0 standard drinks  . Drug use: No  . Sexual activity: Not on file  Lifestyle  . Physical activity:    Days per week: Not on file    Minutes per session: Not on file  . Stress: Not on file  Relationships  . Social connections:    Talks on phone: Not on file    Gets together: Not on file    Attends religious service: Not on file    Active member of club or organization: Not on file    Attends meetings of clubs or organizations: Not on file    Relationship status: Not on file  . Intimate partner violence:    Fear of current or ex partner: Not on file    Emotionally abused: Not on file    Physically abused: Not on file    Forced sexual activity: Not on file  Other Topics Concern  . Not on file  Social History Narrative   She lives with husband of 61 years.  They have a one story home.  One daughter.     Family History  Problem Relation Age of Onset  . Stroke Mother   . Migraines Mother   . Stomach  cancer Father   . Sjogren's syndrome Father   . Sjogren's syndrome Brother   . Sjogren's syndrome Brother   . Scleroderma Sister        raynaud's scleroderma  . Lymphoma Daughter   . Migraines Daughter   . Heart disease Unknown        maternal side  . Stroke Unknown        maternal side, 1st degree female <60    ROS: no fevers or chills, productive cough, hemoptysis, dysphasia, odynophagia, melena, hematochezia, dysuria, hematuria, rash, seizure activity, orthopnea, PND, pedal edema, claudication. Remaining systems are negative.  Physical Exam: Well-developed well-nourished in no acute distress.  Skin is warm and dry.  HEENT is normal.  Neck is supple.  Chest is clear to auscultation with normal expansion.  Cardiovascular exam is regular rate and rhythm.  Abdominal exam nontender or distended. No masses palpated. Extremities show no edema. neuro grossly intact  ECG- personally reviewed  A/P  1  Krystal Vos, MD    

## 2018-01-15 ENCOUNTER — Ambulatory Visit: Payer: Medicare Other | Admitting: Cardiology

## 2018-01-21 DIAGNOSIS — H02403 Unspecified ptosis of bilateral eyelids: Secondary | ICD-10-CM | POA: Diagnosis not present

## 2018-01-21 DIAGNOSIS — Z961 Presence of intraocular lens: Secondary | ICD-10-CM | POA: Diagnosis not present

## 2018-01-21 DIAGNOSIS — H409 Unspecified glaucoma: Secondary | ICD-10-CM | POA: Diagnosis not present

## 2018-01-21 DIAGNOSIS — H18893 Other specified disorders of cornea, bilateral: Secondary | ICD-10-CM | POA: Diagnosis not present

## 2018-01-21 DIAGNOSIS — M3501 Sicca syndrome with keratoconjunctivitis: Secondary | ICD-10-CM | POA: Diagnosis not present

## 2018-02-22 ENCOUNTER — Other Ambulatory Visit: Payer: Self-pay | Admitting: Cardiology

## 2018-02-22 DIAGNOSIS — I4891 Unspecified atrial fibrillation: Secondary | ICD-10-CM

## 2018-02-23 DIAGNOSIS — H401133 Primary open-angle glaucoma, bilateral, severe stage: Secondary | ICD-10-CM | POA: Diagnosis not present

## 2018-03-07 NOTE — Progress Notes (Deleted)
HPI: FU atrial fibrillation. She has a hx of Sjogren's syndrome, seizure disorder, HTN, HL, peripheral neuropathy. She was admitted 9/14 after presenting with overall functional decline, cough and generalized weakness and malaise. She was found to be in atrial fibrillation with RVR. CHADS2-VASc=4. She was placed on Xarelto for anticoagulation. Echo (12/07/12): Moderate focal basal hypertrophy of the septum, vigorous LV function, EF 65-70%, indeterminate diastolic function, normal wall motion, trivial MR, moderate LAE, trivial TR. She converted to sinus rhythm. MRI in October of 2014 showed a very small acute/subacute left thalamic infarct. Patient found to be in recurrent atrial fibrillation 8/16. Nuclear study 8/16 showed EF 65 and no ischemia. Patient declined cardioversion previously and her amiodarone was discontinued. Holter monitor February 2017 showed atrial fibrillation with PVCs or aberrantly conducted beats rate controlled. Showed 1-39% bilateral stenosis. Patient seen recently for worsening lower ext edema.  Echocardiogram March 2019 showed .  Lower extremity Dopplers March 2019 showed normal LV function with proximal septal thickening.  There was mild mitral regurgitation, moderate left atrial enlargement and mild tricuspid regurgitation. Pt continued on lasix 20 mg daily and compression hose recommended. Since last seen,   Current Outpatient Medications  Medication Sig Dispense Refill  . cephALEXin (KEFLEX) 500 MG capsule cephalexin 500 mg capsule    . cyclobenzaprine (FLEXERIL) 5 MG tablet TAKE 1 TABLET BY MOUTH AT BEDTIME 90 tablet 3  . diltiazem (CARDIZEM CD) 180 MG 24 hr capsule TAKE 1 CAPSULE (180 MG TOTAL) BY MOUTH DAILY. 90 capsule 3  . dorzolamide (TRUSOPT) 2 % ophthalmic solution Place 1 drop into both eyes 2 (two) times daily.    . dorzolamide-timolol (COSOPT) 22.3-6.8 MG/ML ophthalmic solution     . ELIQUIS 5 MG TABS tablet TAKE 1 TABLET (5 MG TOTAL) BY MOUTH 2 (TWO) TIMES  DAILY. 180 tablet 1  . fenofibrate (TRICOR) 145 MG tablet Take 145 mg by mouth at bedtime.   0  . FERREX 150 150 MG capsule     . ferrous sulfate 325 (65 FE) MG tablet Take 325 mg by mouth 2 (two) times daily.  12  . furosemide (LASIX) 20 MG tablet TAKE 1 TABLET BY MOUTH EVERY DAY 90 tablet 1  . lisinopril (PRINIVIL,ZESTRIL) 2.5 MG tablet lisinopril 2.5 mg tablet    . LYRICA 100 MG capsule Take 100 mg by mouth 2 (two) times daily.  1  . metFORMIN (GLUCOPHAGE-XR) 500 MG 24 hr tablet Take 500 mg by mouth every evening.     Marland Kitchen omeprazole-sodium bicarbonate (ZEGERID) 40-1100 MG capsule Take 1 capsule by mouth daily before breakfast.    . ondansetron (ZOFRAN) 8 MG tablet TAKE ONE TABLET EVERY 6 HOURS AS NEEDED FOR NAUSEA ORALLY 10 DAYS  0  . sucralfate (CARAFATE) 1 g tablet sucralfate 1 gram tablet    . timolol (BETIMOL) 0.5 % ophthalmic solution Place 1 drop into both eyes 2 (two) times daily.     . tizanidine (ZANAFLEX) 2 MG capsule TAKE 1 CAPSULE (2 MG TOTAL) 3 (THREE) TIMES DAILY BY MOUTH. 90 capsule 5  . traMADol (ULTRAM) 50 MG tablet Take 1-2 tablets (50-100 mg total) by mouth every 6 (six) hours as needed for moderate pain or severe pain. 30 tablet 0  . traZODone (DESYREL) 50 MG tablet Take 25-50 mg by mouth at bedtime as needed for sleep.    Marland Kitchen triamcinolone cream (KENALOG) 0.1 %      No current facility-administered medications for this visit.  Past Medical History:  Diagnosis Date  . Acute respiratory failure with hypoxia ([place]) 12/06/2012  . Allergy   . Blindness of right eye   . Complication of anesthesia   . Dehydration with hyponatremia 12/07/2012  . Diabetes mellitus without complication ([place])    diagnosed 2 weeks ago- No CBG meter as of yet.  Marland Kitchen DIVERTICULITIS, HX OF 01/27/2009   Qualifier: Diagnosis of  By: Jerold Coombe    . Gait disorder 06/05/2012  . GERD (gastroesophageal reflux disease)   . Glaucoma   . Glucose intolerance (impaired glucose tolerance)   . History  of diverticulitis   . Hypertension   . Legally blind   . Migraine triggered seizures ([place])    history of  . Migraines   . Neuropathy   . OP (osteoporosis)   . Osteoarthritis    osteoarthritis -right knee. uses walker. Left shoulder -"Limited ROM"  . Paroxysmal atrial fibrillation ([place]) 12/06/2012   Echo (12/07/12): Moderate focal basal hypertrophy of the septum, vigorous LV function, EF 65-70%, indeterminate diastolic function, normal wall motion, trivial MR, moderate LAE, trivial TR;  Xarelto started 11/2012  . Peripheral neuropathy    neuropathy  . Pneumonia   . PONV (postoperative nausea and vomiting)    no problems other past few surgeries  . SHINGLES, HX OF 07/17/2009   Qualifier: Diagnosis of  By: Jerold Coombe    . Siriasis   . Sjogren's syndrome ([place])    bilateral vision  . Sleep apnea    does not wear CPAP  . Stroke [facility])    residual - rare Intermittent expressive aphasia,    Past Surgical History:  Procedure Laterality Date  . BONE TUMOR EXCISION Right    arm  . CATARACT EXTRACTION    . COLONOSCOPY WITH PROPOFOL N/A 06/09/2015   Procedure: COLONOSCOPY WITH PROPOFOL;  Surgeon: Ronald Lobo, MD;  Location: [place];  Service: Endoscopy;  Laterality: N/A;  . DILATION AND CURETTAGE OF UTERUS    . ESOPHAGOGASTRODUODENOSCOPY N/A 04/17/2016   Procedure: ESOPHAGOGASTRODUODENOSCOPY (EGD);  Surgeon: Teena Irani, MD;  Location: [facility] ENDOSCOPY;  Service: Endoscopy;  Laterality: N/A;  . ESOPHAGOGASTRODUODENOSCOPY (EGD) WITH PROPOFOL N/A 06/09/2015   Procedure: ESOPHAGOGASTRODUODENOSCOPY (EGD) WITH PROPOFOL;  Surgeon: Ronald Lobo, MD;  Location: [facility] ENDOSCOPY;  Service: Endoscopy;  Laterality: N/A;  . EYE SURGERY    . FOOT FRACTURE SURGERY Left 2007  . GIVENS CAPSULE STUDY N/A 04/17/2016   Procedure: GIVENS CAPSULE STUDY;  Surgeon: Teena Irani, MD;  Location: [place];  Service: Endoscopy;  Laterality: N/A;  . KNEE ARTHROSCOPY Right   . LUMBAR LAMINECTOMY    . ROBOT  ASSISTED LAPAROSCOPIC PARTIAL COLECTOMY  02/07/2016   Procedure: ROBOT ASSISTED LAPAROSCOPIC PARTIAL COLECTOMY;  Surgeon: Leighton Ruff, MD;  Location: [facility];  Service: General;;  . SHOULDER OPEN ROTATOR CUFF REPAIR    . TONSILLECTOMY AND ADENOIDECTOMY    . TOTAL ABDOMINAL HYSTERECTOMY W/ BILATERAL SALPINGOOPHORECTOMY      Social History   Socioeconomic History  . Marital status: Married    Spouse name: jesse  . Number of children: 2  . Years of education: college  . Highest education level: Not on file  Occupational History  . Occupation: retired  Scientific laboratory technician  . Financial resource strain: Not on file  . Food insecurity:    Worry: Not on file    Inability: Not on file  . Transportation needs:    Medical: Not on file    Non-medical: Not on file  Tobacco Use  . Smoking status: Former Smoker    Types: Cigarettes  . Smokeless tobacco: Never Used  . Tobacco comment: quit smoking before age of 84  Substance and Sexual Activity  . Alcohol use: No    Alcohol/week: 0.0 standard drinks  . Drug use: No  . Sexual activity: Not on file  Lifestyle  . Physical activity:    Days per week: Not on file    Minutes per session: Not on file  . Stress: Not on file  Relationships  . Social connections:    Talks on phone: Not on file    Gets together: Not on file    Attends religious service: Not on file    Active member of club or organization: Not on file    Attends meetings of clubs or organizations: Not on file    Relationship status: Not on file  . Intimate partner violence:    Fear of current or ex partner: Not on file    Emotionally abused: Not on file    Physically abused: Not on file    Forced sexual activity: Not on file  Other Topics Concern  . Not on file  Social History Narrative   She lives with husband of 73 years.  They have a one story home.  One daughter.     Family History  Problem Relation Age of Onset  . Stroke Mother   . Migraines Mother   . Stomach  cancer Father   . Sjogren's syndrome Father   . Sjogren's syndrome Brother   . Sjogren's syndrome Brother   . Scleroderma Sister        raynaud's scleroderma  . Lymphoma Daughter   . Migraines Daughter   . Heart disease Unknown        maternal side  . Stroke Unknown        maternal side, 1st degree female <60    ROS: no fevers or chills, productive cough, hemoptysis, dysphasia, odynophagia, melena, hematochezia, dysuria, hematuria, rash, seizure activity, orthopnea, PND, pedal edema, claudication. Remaining systems are negative.  Physical Exam: Well-developed well-nourished in no acute distress.  Skin is warm and dry.  HEENT is normal.  Neck is supple.  Chest is clear to auscultation with normal expansion.  Cardiovascular exam is regular rate and rhythm.  Abdominal exam nontender or distended. No masses palpated. Extremities show no edema. neuro grossly intact  ECG- personally reviewed  A/P  1  Kirk Ruths, MD

## 2018-03-17 ENCOUNTER — Ambulatory Visit: Payer: Medicare Other | Admitting: Cardiology

## 2018-03-17 ENCOUNTER — Encounter

## 2018-03-24 DIAGNOSIS — M170 Bilateral primary osteoarthritis of knee: Secondary | ICD-10-CM | POA: Diagnosis not present

## 2018-03-24 DIAGNOSIS — M1711 Unilateral primary osteoarthritis, right knee: Secondary | ICD-10-CM | POA: Diagnosis not present

## 2018-03-25 DIAGNOSIS — M25512 Pain in left shoulder: Secondary | ICD-10-CM | POA: Diagnosis not present

## 2018-04-02 DIAGNOSIS — M1711 Unilateral primary osteoarthritis, right knee: Secondary | ICD-10-CM | POA: Diagnosis not present

## 2018-04-02 IMAGING — RF DG CYSTOGRAM 3+V
11 series · 11 of 11 positions shown · non-contrast
Comparison: CT 01/01/2016

CLINICAL DATA: Ripaldi Rimal repair of colovesicular fistula. Evaluate
for leak after surgery.

EXAM:
CYSTOGRAM
TECHNIQUE: After catheterization of the urinary bladder following sterile
technique the bladder was filled with 250 mL Cysto-Hypaque 30% by
drip infusion. This was maximal capacity, with further filling
limited by patient discomfort and leakage through the urethra.
Serial spot images were obtained during bladder filling and post
draining.
FLUOROSCOPY TIME:  Fluoroscopy Time:  1 minutes 6 seconds
Radiation Exposure Index (if provided by the fluoroscopic device):
44.1 mGy air,
Number of Acquired Spot Images: 0

[Series 1: cp_standard · 0.18mm/px · 1 of 1 slices shown (1 of 4)]
[im 1/1]
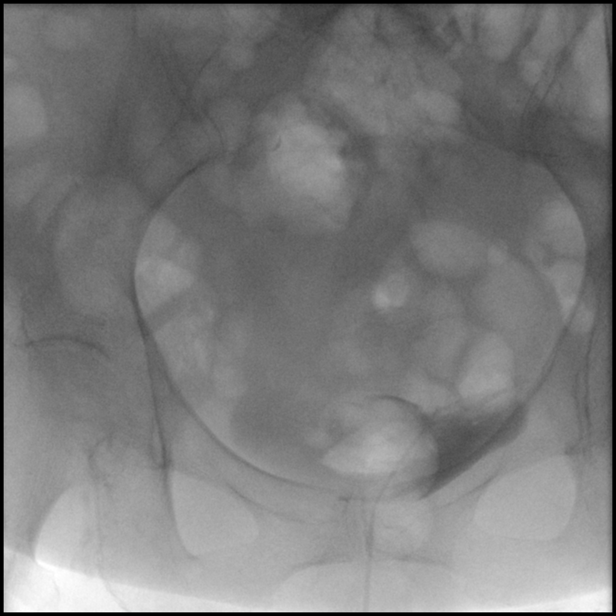

[Series 2: cp_standard · 0.18mm/px · 1 of 1 slices shown (2 of 4)]
[im 1/1]
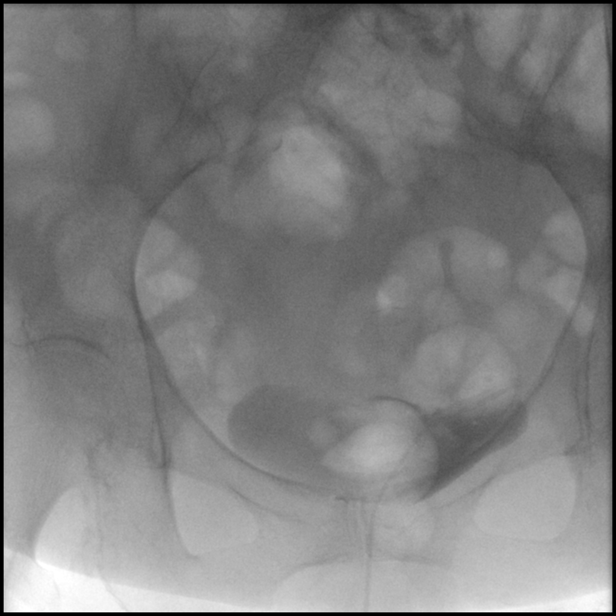

[Series 3: cp_standard · 0.18mm/px · 1 of 1 slices shown (3 of 4)]
[im 1/1]
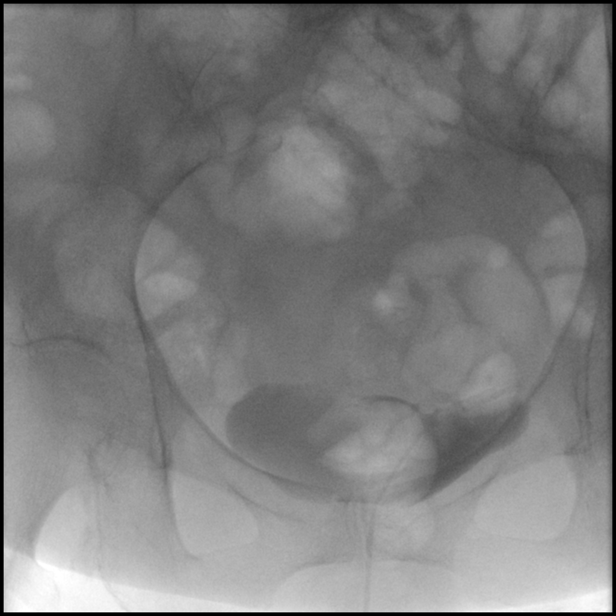

[Series 4: cp_standard · 0.18mm/px · 1 of 1 slices shown (4 of 4)]
[im 1/1]
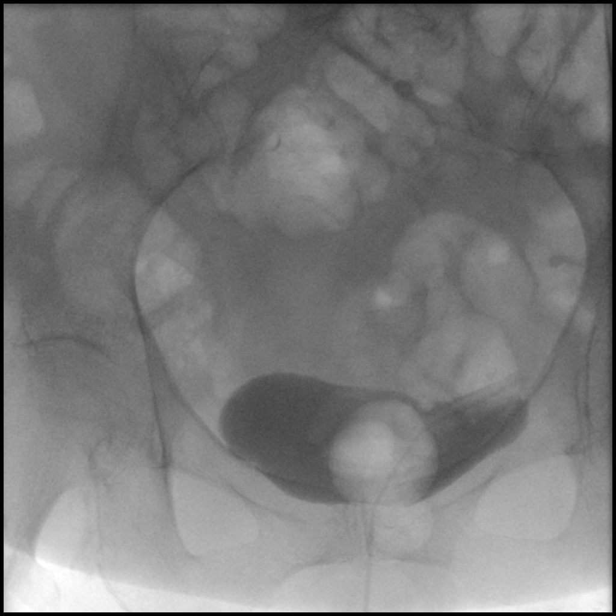

[Series 5: fluoro_iodine 2fps_bw · 0.17mm/px · 1 of 1 slices shown (1 of 7)]
[im 1/1]
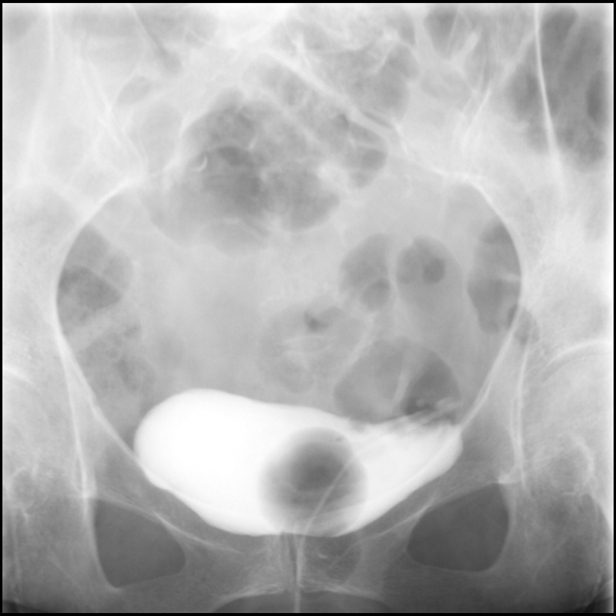

[Series 6: fluoro_iodine 2fps_bw · 0.17mm/px · 1 of 1 slices shown (2 of 7)]
[im 1/1]
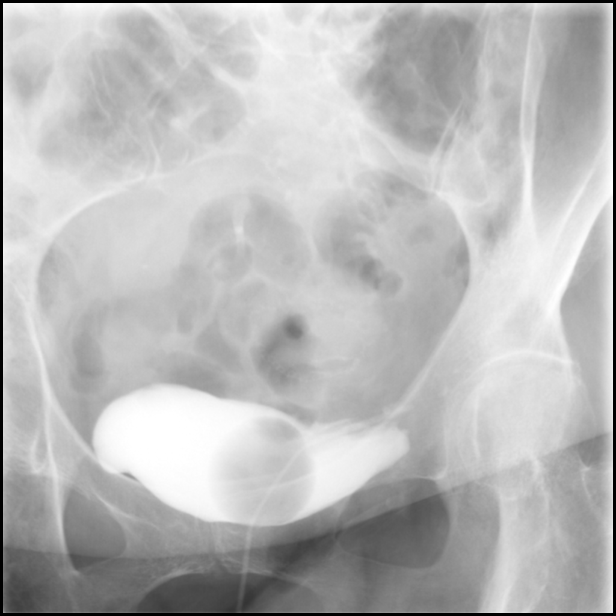

[Series 7: fluoro_iodine 2fps_bw · 0.17mm/px · 1 of 1 slices shown (3 of 7)]
[im 1/1]
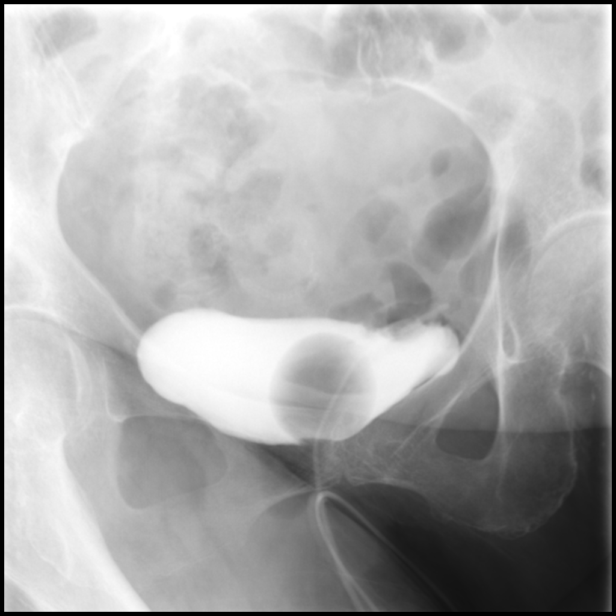

[Series 8: fluoro_iodine 2fps_bw · 0.19mm/px · 1 of 1 slices shown (4 of 7)]
[im 1/1]
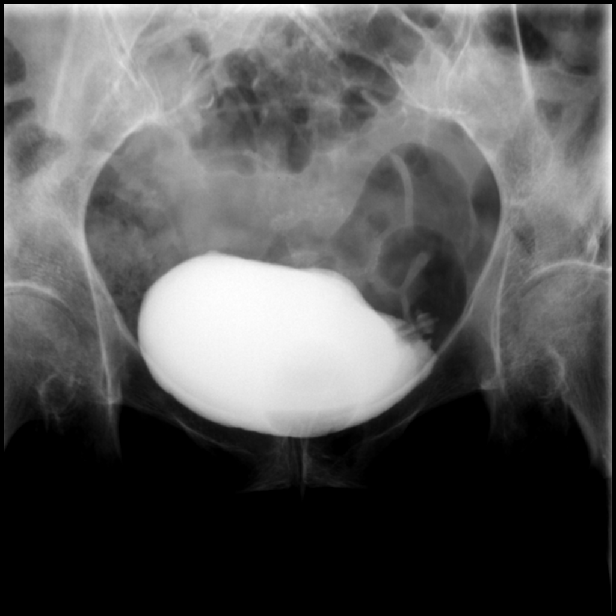

[Series 9: fluoro_iodine 2fps_bw · 0.19mm/px · 1 of 1 slices shown (5 of 7)]
[im 1/1]
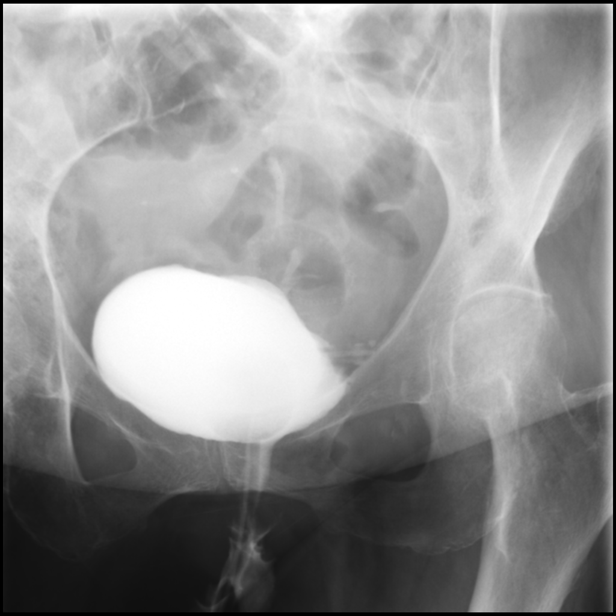

[Series 10: fluoro_iodine 2fps_bw · 0.19mm/px · 1 of 1 slices shown (6 of 7)]
[im 1/1]
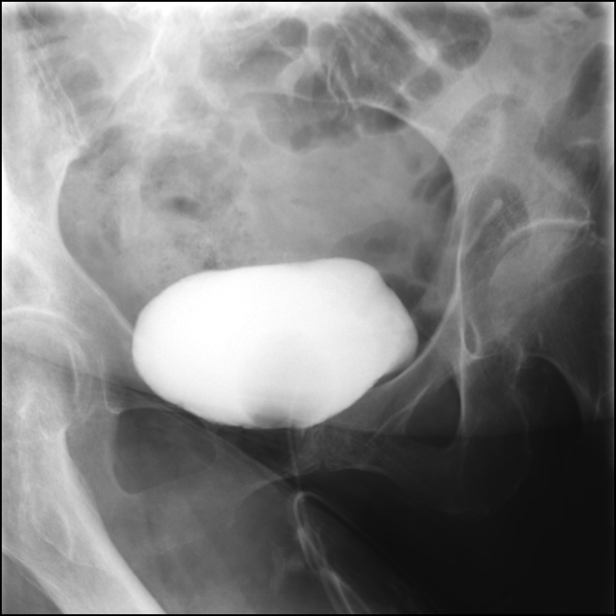

[Series 11: fluoro_iodine 2fps_bw · 0.21mm/px · 1 of 1 slices shown (7 of 7)]
[im 1/1]
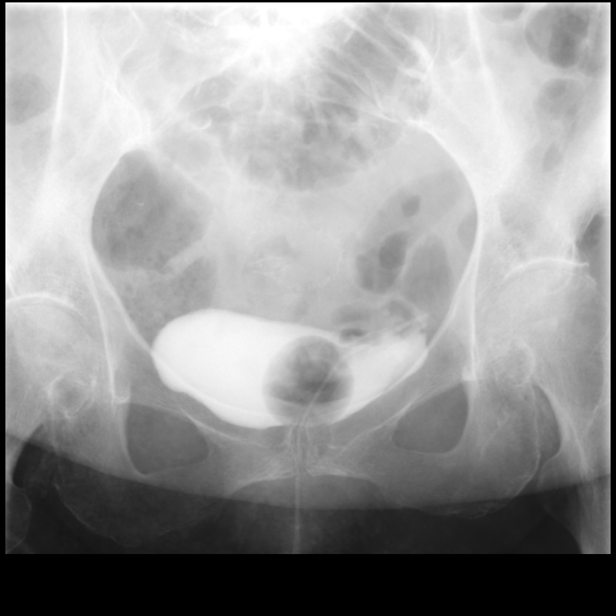

[11 of 11 positions shown; findings below may reference images not displayed]

FINDINGS: Irregularity of the left bladder dome at site of recent surgery,
expected. A Foley catheter tip also distorts this area. No leak or
collection. No vesicoureteral reflux.

Incomplete drainage of the bladder on post drainage image.
IMPRESSION: Postoperative bladder without leak.

## 2018-04-09 ENCOUNTER — Ambulatory Visit: Payer: Medicare Other | Admitting: Physician Assistant

## 2018-04-10 ENCOUNTER — Other Ambulatory Visit: Payer: Self-pay | Admitting: Adult Health

## 2018-04-10 ENCOUNTER — Encounter: Payer: Self-pay | Admitting: *Deleted

## 2018-04-18 NOTE — Progress Notes (Signed)
Follow-up Visit   Date: 04/20/18    Krystal Clarke MRN: [id number] DOB: May 22, 1935   [name] is a 83 y.o. right-handed Caucasian female with hypertension, Sjogren's syndrome (diagnosed 6606) complicated by neuropathy and vision impairment, migraines, atrial fibrillation (on apixiban), history of shingles affecting left T6 (07/2009) and stroke (September 2014, residual word-finding difficulty) returning to the clinic for follow-up visit for neuropathy and headaches.    History of present illness: She was under the care of Dr. Erling Cruz at [facility] for a over 30 years for migraines, stroke, peripheral neuropathy, headaches, and gait instability. She has a long history of migraines or started at the age of 38. Her headaches were well controlled on Depakote 250 twice a day. However, it was stopped due to low platelets and anticoagulation. Previously tried medication for headaches is depakote, Botox (2 trials without benefit) and maxalt. Being on apixiban, she is unable to take NSAIDs due to bleeding risk.  She was given a prednisone taper dose with dramatic improvement of headaches and has been relatively well since then.   She was admitted to the hospital in September 2014 for community-acquired pneumonia and found to have new onset atrial fibrillation. Due to worsening headaches, MRI of the brain was obtained in October which showed a small left thalamic subacute stroke.  In 2015, she was started on Lyrica 150-300mg  for headaches and neuropathy and responded well.    In 2017, she was having sneezing spells with shaking of the arms.  This was evaluated with EEG which was normal, this became worse again in 2018, again EEG was negative. She was hospitalized in November 2018 with colovesical fistula s/p robotic colectomy & bladder closure 02/07/2016.  UPDATE 04/18/2018: She is here for 49-month follow-up visit.  She continues to have gradual decline in overall health due to chronic  comorbidities.  Over the past few months, she had had worsening sharp pain in the scalp and a few spells sneezing.  Her neuropathy is well controlled on Lyrica 100 mg twice daily, however she does report doing better when it was at 150mg  twice a day. She has not had any falls and is very careful with walking.  She is able to do her own ADLs, needs assistance with IADLs.  Unfortunately, her vision remains poor contributing to functional debility.  Medications:  Current Outpatient Medications on File Prior to Visit  Medication Sig Dispense Refill  . cephALEXin (KEFLEX) 500 MG capsule cephalexin 500 mg capsule    . cyclobenzaprine (FLEXERIL) 5 MG tablet TAKE 1 TABLET BY MOUTH AT BEDTIME 90 tablet 3  . diltiazem (CARDIZEM CD) 180 MG 24 hr capsule TAKE 1 CAPSULE (180 MG TOTAL) BY MOUTH DAILY. 90 capsule 3  . dorzolamide (TRUSOPT) 2 % ophthalmic solution Place 1 drop into both eyes 2 (two) times daily.    . dorzolamide-timolol (COSOPT) 22.3-6.8 MG/ML ophthalmic solution     . ELIQUIS 5 MG TABS tablet TAKE 1 TABLET (5 MG TOTAL) BY MOUTH 2 (TWO) TIMES DAILY. 180 tablet 1  . fenofibrate (TRICOR) 145 MG tablet Take 145 mg by mouth at bedtime.   0  . FERREX 150 150 MG capsule     . ferrous sulfate 325 (65 FE) MG tablet Take 325 mg by mouth 2 (two) times daily.  12  . furosemide (LASIX) 20 MG tablet Take 1 tablet (20 mg total) by mouth daily. NEED OV. 90 tablet 0  . lisinopril (PRINIVIL,ZESTRIL) 2.5 MG tablet lisinopril 2.5 mg  tablet    . LYRICA 100 MG capsule Take 100 mg by mouth 2 (two) times daily.  1  . metFORMIN (GLUCOPHAGE-XR) 500 MG 24 hr tablet Take 500 mg by mouth every evening.     Marland Kitchen omeprazole-sodium bicarbonate (ZEGERID) 40-1100 MG capsule Take 1 capsule by mouth daily before breakfast.    . ondansetron (ZOFRAN) 8 MG tablet TAKE ONE TABLET EVERY 6 HOURS AS NEEDED FOR NAUSEA ORALLY 10 DAYS  0  . sucralfate (CARAFATE) 1 g tablet sucralfate 1 gram tablet    . timolol (BETIMOL) 0.5 % ophthalmic  solution Place 1 drop into both eyes 2 (two) times daily.     . tizanidine (ZANAFLEX) 2 MG capsule TAKE 1 CAPSULE (2 MG TOTAL) 3 (THREE) TIMES DAILY BY MOUTH. 90 capsule 5  . traMADol (ULTRAM) 50 MG tablet Take 1-2 tablets (50-100 mg total) by mouth every 6 (six) hours as needed for moderate pain or severe pain. 30 tablet 0  . traZODone (DESYREL) 50 MG tablet Take 25-50 mg by mouth at bedtime as needed for sleep.    Marland Kitchen triamcinolone cream (KENALOG) 0.1 %      No current facility-administered medications on file prior to visit.     Allergies:  Allergies  Allergen Reactions  . Morphine   . Oxycodone-Aspirin Nausea And Vomiting  . Codeine Nausea And Vomiting  . Hydrocodone Nausea And Vomiting  . Morphine And Related Nausea And Vomiting  . Oxycodone Nausea And Vomiting  . Pilocarpine Other (See Comments)    Reaction:  Unknown   . Rivaroxaban Other (See Comments)    Reaction:  Back pain/headaches      Review of Systems:  CONSTITUTIONAL: No fevers, chills, night sweats, no weight loss.   EYES: +visual changes or eye pain ENT: No hearing changes.  No history of nose bleeds.   RESPIRATORY: No cough, wheezing and shortness of breath.   CARDIOVASCULAR: Negative for chest pain, and palpitations.   GI: No abdominal discomfort, blood in stools or black stools.  No recent change in bowel habits.   GU:  No history of incontinence.  MUSCLOSKELETAL: +history of joint pain or swelling.  No myalgias.   SKIN: No for lesions, rash, and itching.   ENDOCRINE: Negative for cold or heat intolerance, polydipsia or goiter.   PSYCH:  No depression or anxiety symptoms.   NEURO: As Above.   Vital Signs:  BP 120/70   Pulse 68   Ht 5' (1.524 m)   Wt 170 lb 6 oz (77.3 kg)   SpO2 96%   BMI 33.27 kg/m   General Medical Exam:   General:  Well appearing, comfortable  Eyes/ENT: see cranial nerve examination.   Neck:   No carotid bruits. Respiratory:  Clear to auscultation, good air entry bilaterally.    Cardiac:  Irregularly irregular rhythm, no murmur.   Ext:  No edema  Neurological Exam: MENTAL STATUS including orientation to time, place, person, recent and remote memory, attention span and concentration, language, and fund of knowledge is fair.   Speech is not dysarthric.    CRANIAL NERVES:  Severe visual impairment bilaterally, blind in the right eye, gross movements in the left.  Mild bilateral ptosis. Face is symmetric. Dry oral mucosa and tongue.     MOTOR: Motor strength is 5-/5 proximally.  Left dorsiflexion and toe extension is 4/5.     SENSORY:  Vibration is intact at the knees, absent distal to ankles.  COORDINATION/GAIT: Gait is moderately wide-based with small, short  steps assisted with walker.   Data: MRI brain wo contrast 12/23/2012: Very small acute/ subacute left thalamic nonhemorrhagic infarct. Prominent small vessel disease type changes. No intracranial hemorrhage. Mild atrophy without hydrocephalus.  Prior work-up at [facility]: EEG 06/03/1995, 08/31/2008, 05/25/2015: normal  CSF 1991: normal  Ambulatory EEG 03/24/2017:  Normal  MRI brain 11/02/2017:  Moderate atrophy, with progression since 2014. Mild chronic microvascular ischemic changes similar to the prior study.  No acute abnormality.  Lab Results  Component Value Date   [id number] 283 04/12/2016   Lab Results  Component Value Date   TSH 0.834 12/06/2012    IMPRESSION/PLAN:   1.  Peripheral neuropathy due to Sjogren's disease, involving stocking-glove distribution.  Neuropathy remains stable.  Pain is relatively well-controlled on Lyrica, however she would like to go back to taking 150 mg twice daily which better controlled her neuropathy, headaches, and sneezing spells.  2.  Tension headaches, continue tizanidine 2mg  TID as needed  3. Migraine without aura - resolved.  Previously used:  VPA (stopped due to low PLT), triptans (stroke), flexeril (no improvement), botox, medrol dose pack (effective)  4.  Spells of  sneezing with associated abnormal hand movements of the right arm, unclear etiology.  Ambulatory EEG is normal.  Patient reassured these movements are not epileptic.  5.  History of left thalamic stroke, likely cardioembolic due to afib on apixiban with mild residual expressive aphasia  6.  Mild vascular dementia.  Stable.   Return to clinic in 6 months  Greater than 50% of this 25 minute visit was spent in counseling, explanation of diagnosis, planning of further management, and coordination of care due to her chronic illnesses and questions..     Thank you for allowing me to participate in patient's care.  If I can answer any additional questions, I would be pleased to do so.    Sincerely,    Winnie Umali K. Posey Pronto, DO

## 2018-04-20 ENCOUNTER — Ambulatory Visit (INDEPENDENT_AMBULATORY_CARE_PROVIDER_SITE_OTHER): Payer: Medicare Other | Admitting: Neurology

## 2018-04-20 ENCOUNTER — Encounter: Payer: Self-pay | Admitting: Neurology

## 2018-04-20 VITALS — BP 120/70 | HR 68 | Ht 60.0 in | Wt 170.4 lb

## 2018-04-20 DIAGNOSIS — G63 Polyneuropathy in diseases classified elsewhere: Secondary | ICD-10-CM | POA: Diagnosis not present

## 2018-04-20 DIAGNOSIS — R269 Unspecified abnormalities of gait and mobility: Secondary | ICD-10-CM | POA: Diagnosis not present

## 2018-04-20 DIAGNOSIS — G44221 Chronic tension-type headache, intractable: Secondary | ICD-10-CM | POA: Diagnosis not present

## 2018-04-20 DIAGNOSIS — M3509 Sicca syndrome with other organ involvement: Secondary | ICD-10-CM | POA: Diagnosis not present

## 2018-04-20 DIAGNOSIS — M3506 Sjogren syndrome with peripheral nervous system involvement: Secondary | ICD-10-CM

## 2018-04-20 NOTE — Patient Instructions (Addendum)
Increase Lyrica 150mg  twice daily.  If you develop swelling of the legs, please call my office and we will need to reduce the dose.  The physicians and staff at [facility] are committed to providing excellent care. You may receive a survey requesting feedback about your experience at our office. We strive to receive "very good" responses to the survey questions. If you feel that your experience would prevent you from giving the office a "very good " response, please contact our office to try to remedy the situation. We may be reached at [phone]. Thank you for taking the time out of your busy day to complete the survey.  Return to clinic in 6 months

## 2018-04-27 ENCOUNTER — Telehealth: Payer: Self-pay | Admitting: Neurology

## 2018-04-27 ENCOUNTER — Other Ambulatory Visit: Payer: Self-pay | Admitting: Adult Health

## 2018-04-27 DIAGNOSIS — G629 Polyneuropathy, unspecified: Secondary | ICD-10-CM | POA: Diagnosis not present

## 2018-04-27 DIAGNOSIS — R7303 Prediabetes: Secondary | ICD-10-CM | POA: Diagnosis not present

## 2018-04-27 DIAGNOSIS — E785 Hyperlipidemia, unspecified: Secondary | ICD-10-CM | POA: Diagnosis not present

## 2018-04-27 DIAGNOSIS — R609 Edema, unspecified: Secondary | ICD-10-CM | POA: Diagnosis not present

## 2018-04-27 DIAGNOSIS — I480 Paroxysmal atrial fibrillation: Secondary | ICD-10-CM | POA: Diagnosis not present

## 2018-04-27 DIAGNOSIS — G4700 Insomnia, unspecified: Secondary | ICD-10-CM | POA: Diagnosis not present

## 2018-04-27 MED ORDER — PREGABALIN 150 MG PO CAPS: 150.0000 mg | ORAL_CAPSULE | Freq: Two times a day (BID) | ORAL | 5 refills | Status: DC

## 2018-04-27 NOTE — Telephone Encounter (Signed)
Lyrica refill

## 2018-04-27 NOTE — Telephone Encounter (Signed)
Rx sent 

## 2018-04-27 NOTE — Telephone Encounter (Signed)
Pharm calling in about dosage for the lasix. Please call her back at [phone]. Thanks!

## 2018-04-27 NOTE — Telephone Encounter (Signed)
Patient needing a refill of her lyrica to the [facility] on [address] please.

## 2018-04-28 NOTE — Telephone Encounter (Signed)
Called Krystal Clarke back and patient's husband meant to say lyrica instead of lasix.  Lyrica has been ordered.

## 2018-05-13 ENCOUNTER — Other Ambulatory Visit: Payer: Self-pay

## 2018-05-13 MED ORDER — APIXABAN 5 MG PO TABS: 5.0000 mg | ORAL_TABLET | Freq: Two times a day (BID) | ORAL | 1 refills | Status: DC

## 2018-05-27 DIAGNOSIS — H02403 Unspecified ptosis of bilateral eyelids: Secondary | ICD-10-CM | POA: Diagnosis not present

## 2018-05-27 DIAGNOSIS — M3501 Sicca syndrome with keratoconjunctivitis: Secondary | ICD-10-CM | POA: Diagnosis not present

## 2018-05-27 DIAGNOSIS — Z961 Presence of intraocular lens: Secondary | ICD-10-CM | POA: Diagnosis not present

## 2018-05-27 DIAGNOSIS — H18893 Other specified disorders of cornea, bilateral: Secondary | ICD-10-CM | POA: Diagnosis not present

## 2018-06-01 DIAGNOSIS — H401133 Primary open-angle glaucoma, bilateral, severe stage: Secondary | ICD-10-CM | POA: Diagnosis not present

## 2018-07-29 ENCOUNTER — Telehealth: Payer: Self-pay | Admitting: Cardiology

## 2018-07-29 NOTE — Progress Notes (Signed)
Virtual Visit via Video Note   This visit type was conducted due to national recommendations for restrictions regarding the COVID-19 Pandemic (e.g. social distancing) in an effort to limit this patient's exposure and mitigate transmission in our community.  Due to her co-morbid illnesses, this patient is at least at moderate risk for complications without adequate follow up.  This format is felt to be most appropriate for this patient at this time.  All issues noted in this document were discussed and addressed.  A limited physical exam was performed with this format.  Please refer to the patient's chart for her consent to telehealth for [facility].   Date:  07/30/2018   ID:  Krystal Clarke, DOB 1935/12/29, MRN [id number]  Patient Location: Home Provider Location: Home  PCP:  Harlan Stains, MD  Cardiologist:  Kirk Ruths, MD   Evaluation Performed:  Follow-Up Visit  Chief Complaint:  FU atrial fibrillation  History of Present Illness:    FU atrial fibrillation. She has a hx of Sjogren's syndrome, seizure disorder, HTN, HL, peripheral neuropathy. She was admitted 9/14 after presenting with overall functional decline, cough and generalized weakness and malaise. She was found to be in atrial fibrillation with RVR. CHADS2-VASc=4. She was placed on Xarelto for anticoagulation. She converted to sinus rhythm. MRI in October of 2014 showed a very small acute/subacute left thalamic infarct. Patient found to be in recurrent atrial fibrillation 8/16. Nuclear study 8/16 showed EF 65 and no ischemia. Patient declined cardioversion previously and her amiodarone was discontinued. Holter monitor February 2017 showed atrial fibrillation with PVCs or aberrantly conducted beats rate controlled. Showed 1-39% bilateral stenosis. Echocardiogram March 2019 showed normal LV function, mild mitral regurgitation, moderate left atrial enlargement, mild right ventricular enlargement. Since last seen,patient  denies dyspnea, chest pain, palpitations, syncope, bleeding or lower extremity edema.  The patient does not have symptoms concerning for COVID-19 infection (fever, chills, cough, or new shortness of breath).    Past Medical History:  Diagnosis Date  . Acute respiratory failure with hypoxia ([place]) 12/06/2012  . Allergy   . Blindness of right eye   . Complication of anesthesia   . Dehydration with hyponatremia 12/07/2012  . Diabetes mellitus without complication ([place])    diagnosed 2 weeks ago- No CBG meter as of yet.  Marland Kitchen DIVERTICULITIS, HX OF 01/27/2009   Qualifier: Diagnosis of  By: Jerold Coombe    . Gait disorder 06/05/2012  . GERD (gastroesophageal reflux disease)   . Glaucoma   . Glucose intolerance (impaired glucose tolerance)   . History of diverticulitis   . Hypertension   . Legally blind   . Migraine triggered seizures ([place])    history of  . Migraines   . Neuropathy   . OP (osteoporosis)   . Osteoarthritis    osteoarthritis -right knee. uses walker. Left shoulder -"Limited ROM"  . Paroxysmal atrial fibrillation ([place]) 12/06/2012   Echo (12/07/12): Moderate focal basal hypertrophy of the septum, vigorous LV function, EF 65-70%, indeterminate diastolic function, normal wall motion, trivial MR, moderate LAE, trivial TR;  Xarelto started 11/2012  . Peripheral neuropathy    neuropathy  . Pneumonia   . PONV (postoperative nausea and vomiting)    no problems other past few surgeries  . SHINGLES, HX OF 07/17/2009   Qualifier: Diagnosis of  By: Jerold Coombe    . Siriasis   . Sjogren's syndrome ([place])    bilateral vision  . Sleep apnea    does not wear  CPAP  . Stroke ([place])    residual - rare Intermittent expressive aphasia,   Past Surgical History:  Procedure Laterality Date  . BONE TUMOR EXCISION Right    arm  . CATARACT EXTRACTION    . COLONOSCOPY WITH PROPOFOL N/A 06/09/2015   Procedure: COLONOSCOPY WITH PROPOFOL;  Surgeon: Ronald Lobo, MD;  Location: [place];   Service: Endoscopy;  Laterality: N/A;  . DILATION AND CURETTAGE OF UTERUS    . ESOPHAGOGASTRODUODENOSCOPY N/A 04/17/2016   Procedure: ESOPHAGOGASTRODUODENOSCOPY (EGD);  Surgeon: Teena Irani, MD;  Location[facility] ENDOSCOPY;  Service: Endoscopy;  Laterality: N/A;  . ESOPHAGOGASTRODUODENOSCOPY (EGD) WITH PROPOFOL N/A 06/09/2015   Procedure: ESOPHAGOGASTRODUODENOSCOPY (EGD) WITH PROPOFOL;  Surgeon: Ronald Lobo, MD;  Location: [facility] ENDOSCOPY;  Service: Endoscopy;  Laterality: N/A;  . EYE SURGERY    . FOOT FRACTURE SURGERY Left 2007  . GIVENS CAPSULE STUDY N/A 04/17/2016   Procedure: GIVENS CAPSULE STUDY;  Surgeon: Teena Irani, MD;  Location: [place];  Service: Endoscopy;  Laterality: N/A;  . KNEE ARTHROSCOPY Right   . LUMBAR LAMINECTOMY    . ROBOT ASSISTED LAPAROSCOPIC PARTIAL COLECTOMY  02/07/2016   Procedure: ROBOT ASSISTED LAPAROSCOPIC PARTIAL COLECTOMY;  Surgeon: Leighton Ruff, MD;  [facility]: [facility];  Service: General;;  . SHOULDER OPEN ROTATOR CUFF REPAIR    . TONSILLECTOMY AND ADENOIDECTOMY    . TOTAL ABDOMINAL HYSTERECTOMY W/ BILATERAL SALPINGOOPHORECTOMY       Current Meds  Medication Sig  . apixaban (ELIQUIS) 5 MG TABS tablet Take 1 tablet (5 mg total) by mouth 2 (two) times daily.  . cephALEXin (KEFLEX) 500 MG capsule cephalexin 500 mg capsule  . diltiazem (CARDIZEM CD) 180 MG 24 hr capsule TAKE 1 CAPSULE (180 MG TOTAL) BY MOUTH DAILY.  Marland Kitchen dorzolamide (TRUSOPT) 2 % ophthalmic solution Place 1 drop into both eyes 2 (two) times daily.  . dorzolamide-timolol (COSOPT) 22.3-6.8 MG/ML ophthalmic solution   . fenofibrate (TRICOR) 145 MG tablet Take 145 mg by mouth at bedtime.   . ondansetron (ZOFRAN) 8 MG tablet TAKE ONE TABLET EVERY 6 HOURS AS NEEDED FOR NAUSEA ORALLY 10 DAYS  . pantoprazole (PROTONIX) 40 MG tablet Take 40 mg by mouth daily.  . pregabalin (LYRICA) 150 MG capsule Take 1 capsule (150 mg total) by mouth 2 (two) times daily.     Allergies:   Morphine; Oxycodone-aspirin;  Codeine; Hydrocodone; Morphine and related; Oxycodone; Pilocarpine; and Rivaroxaban   Social History   Tobacco Use  . Smoking status: Former Smoker    Types: Cigarettes  . Smokeless tobacco: Never Used  . Tobacco comment: quit smoking before age of 83  Substance Use Topics  . Alcohol use: No    Alcohol/week: 0.0 standard drinks  . Drug use: No     Family Hx: The patient's family history includes Heart disease in an other family member; Lymphoma in her daughter; Migraines in her daughter and mother; Scleroderma in her sister; Sjogren's syndrome in her brother, brother, and father; Stomach cancer in her father; Stroke in her mother and another family member.  ROS:   Please see the history of present illness.    No fevers, chills or productive cough.  Patient with peripheral neuropathy. All other systems reviewed and are negative.   Recent Lipid Panel Lab Results  Component Value Date/Time   CHOL 129 07/17/2009 08:45 AM   TRIG 128.0 07/17/2009 08:45 AM   HDL 47.20 07/17/2009 08:45 AM   CHOLHDL 3 07/17/2009 08:45 AM   LDLCALC 56 07/17/2009 08:45 AM  Wt Readings from Last 3 Encounters:  04/20/18 170 lb 6 oz (77.3 kg)  10/10/17 179 lb 8 oz (81.4 kg)  06/23/17 180 lb (81.6 kg)     Objective:    Vital Signs:  There were no vitals taken for this visit.   VITAL SIGNS:  reviewed  No acute distress Answers questions appropriately Normal affect Remainder physical examination not performed (telehealth visit; coronavirus pandemic)  ASSESSMENT & PLAN:    1. Permanent atrial fibrillation-patient declined cardioversion in the past.  We are therefore treating with rate control and anticoagulation.  Continue Cardizem and apixaban.  Check hemoglobin and renal function. 2. Hypertension-Continue present medications and follow. 3. Chronic diastolic congestive heart failure-based on history she is euvolemic.  We discussed fluid restriction and low-sodium diet.  Check BMET. 4.  Hyperlipidemia-managed by primary care.  COVID-19 Education: The importance of social distancing was discussed today.  Time:   Today, I have spent 10 minutes with the patient with telehealth technology discussing the above problems.     Medication Adjustments/Labs and Tests Ordered: Current medicines are reviewed at length with the patient today.  Concerns regarding medicines are outlined above.   Tests Ordered: No orders of the defined types were placed in this encounter.   Medication Changes: No orders of the defined types were placed in this encounter.   Disposition:  Follow up in 6 month(s)  Signed, Kirk Ruths, MD  07/30/2018 9:08 AM    [place]

## 2018-07-29 NOTE — Telephone Encounter (Signed)
Home phone/ my chart declined/ consent/ pre reg completed °

## 2018-07-30 ENCOUNTER — Telehealth (INDEPENDENT_AMBULATORY_CARE_PROVIDER_SITE_OTHER): Payer: Medicare Other | Admitting: Cardiology

## 2018-07-30 ENCOUNTER — Encounter: Payer: Self-pay | Admitting: Cardiology

## 2018-07-30 DIAGNOSIS — I10 Essential (primary) hypertension: Secondary | ICD-10-CM

## 2018-07-30 DIAGNOSIS — I110 Hypertensive heart disease with heart failure: Secondary | ICD-10-CM | POA: Diagnosis not present

## 2018-07-30 DIAGNOSIS — I5032 Chronic diastolic (congestive) heart failure: Secondary | ICD-10-CM | POA: Diagnosis not present

## 2018-07-30 DIAGNOSIS — I4821 Permanent atrial fibrillation: Secondary | ICD-10-CM | POA: Diagnosis not present

## 2018-07-30 NOTE — Patient Instructions (Signed)
Medication Instructions:   If you need a refill on your cardiac medications before your next appointment, please call your pharmacy.   Lab work: Your physician recommends that you return for lab work in: 4-6 WEEKS If you have labs (blood work) drawn today and your tests are completely normal, you will receive your results only by: Marland Kitchen MyChart Message (if you have MyChart) OR . A paper copy in the mail If you have any lab test that is abnormal or we need to change your treatment, we will call you to review the results.  Follow-Up: At [facility], you and your health needs are our priority.  As part of our continuing mission to provide you with exceptional heart care, we have created designated Provider Care Teams.  These Care Teams include your primary Cardiologist (physician) and Advanced Practice Providers (APPs -  Physician Assistants and Nurse Practitioners) who all work together to provide you with the care you need, when you need it. You will need a follow up appointment in 6 months.  Please call our office 2 months in advance to schedule this appointment.  You may see Kirk Ruths, MD or one of the following Advanced Practice Providers on your designated Care Team:   Kerin Ransom, PA-C Roby Lofts, [place] . Sande Rives, PA-C

## 2018-08-12 DIAGNOSIS — I4821 Permanent atrial fibrillation: Secondary | ICD-10-CM | POA: Diagnosis not present

## 2018-08-13 DIAGNOSIS — M1711 Unilateral primary osteoarthritis, right knee: Secondary | ICD-10-CM | POA: Diagnosis not present

## 2018-08-13 DIAGNOSIS — E1143 Type 2 diabetes mellitus with diabetic autonomic (poly)neuropathy: Secondary | ICD-10-CM | POA: Diagnosis not present

## 2018-08-13 DIAGNOSIS — M47816 Spondylosis without myelopathy or radiculopathy, lumbar region: Secondary | ICD-10-CM | POA: Diagnosis not present

## 2018-08-13 DIAGNOSIS — I480 Paroxysmal atrial fibrillation: Secondary | ICD-10-CM | POA: Diagnosis not present

## 2018-08-13 DIAGNOSIS — E785 Hyperlipidemia, unspecified: Secondary | ICD-10-CM | POA: Diagnosis not present

## 2018-08-13 LAB — CBC
Hematocrit: 51.2 % — ABNORMAL HIGH (ref 34.0–46.6)
Hemoglobin: 16.8 g/dL — ABNORMAL HIGH (ref 11.1–15.9)
MCH: 30.5 pg (ref 26.6–33.0)
MCHC: 32.8 g/dL (ref 31.5–35.7)
MCV: 93 fL (ref 79–97)
Platelets: 207 10*3/uL (ref 150–450)
RBC: 5.5 x10E6/uL — ABNORMAL HIGH (ref 3.77–5.28)
RDW: 13.3 % (ref 11.7–15.4)
WBC: 7.8 10*3/uL (ref 3.4–10.8)

## 2018-08-13 LAB — BASIC METABOLIC PANEL
BUN/Creatinine Ratio: 16 (ref 12–28)
BUN: 14 mg/dL (ref 8–27)
CO2: 26 mmol/L (ref 20–29)
Calcium: 10.8 mg/dL — ABNORMAL HIGH (ref 8.7–10.3)
Chloride: 98 mmol/L (ref 96–106)
Creatinine, Ser: 0.87 mg/dL (ref 0.57–1.00)
GFR calc Af Amer: 72 mL/min/{1.73_m2} (ref >59)
GFR calc non Af Amer: 63 mL/min/{1.73_m2} (ref >59)
Glucose: 116 mg/dL — ABNORMAL HIGH (ref 65–99)
Potassium: 4.3 mmol/L (ref 3.5–5.2)
Sodium: 138 mmol/L (ref 134–144)

## 2018-08-19 DIAGNOSIS — M1711 Unilateral primary osteoarthritis, right knee: Secondary | ICD-10-CM | POA: Diagnosis not present

## 2018-08-19 DIAGNOSIS — M25512 Pain in left shoulder: Secondary | ICD-10-CM | POA: Diagnosis not present

## 2018-08-31 DIAGNOSIS — R61 Generalized hyperhidrosis: Secondary | ICD-10-CM | POA: Diagnosis not present

## 2018-08-31 DIAGNOSIS — M1711 Unilateral primary osteoarthritis, right knee: Secondary | ICD-10-CM | POA: Diagnosis not present

## 2018-08-31 DIAGNOSIS — K219 Gastro-esophageal reflux disease without esophagitis: Secondary | ICD-10-CM | POA: Diagnosis not present

## 2018-08-31 DIAGNOSIS — I480 Paroxysmal atrial fibrillation: Secondary | ICD-10-CM | POA: Diagnosis not present

## 2018-08-31 DIAGNOSIS — R1084 Generalized abdominal pain: Secondary | ICD-10-CM | POA: Diagnosis not present

## 2018-09-14 ENCOUNTER — Other Ambulatory Visit: Payer: Self-pay | Admitting: Neurology

## 2018-10-07 DIAGNOSIS — H401133 Primary open-angle glaucoma, bilateral, severe stage: Secondary | ICD-10-CM | POA: Diagnosis not present

## 2018-10-08 ENCOUNTER — Other Ambulatory Visit: Payer: Self-pay | Admitting: Neurology

## 2018-10-12 DIAGNOSIS — H02403 Unspecified ptosis of bilateral eyelids: Secondary | ICD-10-CM | POA: Diagnosis not present

## 2018-10-12 DIAGNOSIS — H18893 Other specified disorders of cornea, bilateral: Secondary | ICD-10-CM | POA: Diagnosis not present

## 2018-10-12 DIAGNOSIS — H401133 Primary open-angle glaucoma, bilateral, severe stage: Secondary | ICD-10-CM | POA: Diagnosis not present

## 2018-10-12 DIAGNOSIS — E785 Hyperlipidemia, unspecified: Secondary | ICD-10-CM | POA: Diagnosis not present

## 2018-10-12 DIAGNOSIS — I480 Paroxysmal atrial fibrillation: Secondary | ICD-10-CM | POA: Diagnosis not present

## 2018-10-12 DIAGNOSIS — M1711 Unilateral primary osteoarthritis, right knee: Secondary | ICD-10-CM | POA: Diagnosis not present

## 2018-10-12 DIAGNOSIS — E1143 Type 2 diabetes mellitus with diabetic autonomic (poly)neuropathy: Secondary | ICD-10-CM | POA: Diagnosis not present

## 2018-10-12 DIAGNOSIS — Z961 Presence of intraocular lens: Secondary | ICD-10-CM | POA: Diagnosis not present

## 2018-10-12 DIAGNOSIS — M47816 Spondylosis without myelopathy or radiculopathy, lumbar region: Secondary | ICD-10-CM | POA: Diagnosis not present

## 2018-10-12 DIAGNOSIS — M3501 Sicca syndrome with keratoconjunctivitis: Secondary | ICD-10-CM | POA: Diagnosis not present

## 2018-10-12 DIAGNOSIS — M3500 Sicca syndrome, unspecified: Secondary | ICD-10-CM | POA: Diagnosis not present

## 2018-10-16 ENCOUNTER — Encounter: Payer: Self-pay | Admitting: Neurology

## 2018-10-19 ENCOUNTER — Other Ambulatory Visit: Payer: Self-pay

## 2018-10-19 ENCOUNTER — Encounter: Payer: Self-pay | Admitting: Neurology

## 2018-10-19 ENCOUNTER — Telehealth (INDEPENDENT_AMBULATORY_CARE_PROVIDER_SITE_OTHER): Payer: Medicare Other | Admitting: Neurology

## 2018-10-19 DIAGNOSIS — M3506 Sjogren syndrome with peripheral nervous system involvement: Secondary | ICD-10-CM

## 2018-10-19 DIAGNOSIS — G8929 Other chronic pain: Secondary | ICD-10-CM

## 2018-10-19 DIAGNOSIS — G63 Polyneuropathy in diseases classified elsewhere: Secondary | ICD-10-CM

## 2018-10-19 DIAGNOSIS — M48062 Spinal stenosis, lumbar region with neurogenic claudication: Secondary | ICD-10-CM

## 2018-10-19 DIAGNOSIS — R269 Unspecified abnormalities of gait and mobility: Secondary | ICD-10-CM | POA: Diagnosis not present

## 2018-10-19 DIAGNOSIS — G9519 Other vascular myelopathies: Secondary | ICD-10-CM

## 2018-10-19 MED ORDER — PREGABALIN 100 MG PO CAPS: 100.0000 mg | ORAL_CAPSULE | Freq: Three times a day (TID) | ORAL | 5 refills | Status: DC

## 2018-10-19 NOTE — Progress Notes (Signed)
    Virtual Visit via Telephone Note The purpose of this virtual visit is to provide medical care while limiting exposure to the novel coronavirus.    Consent was obtained for phone visit:  Yes.   Answered questions that patient had about telehealth interaction:  Yes.   I discussed the limitations, risks, security and privacy concerns of performing an evaluation and management service by telephone. I also discussed with the patient that there may be a patient responsible charge related to this service. The patient expressed understanding and agreed to proceed.  Pt location: Home Physician Location: office Name of referring provider:  Harlan Stains, MD I connected with .Krystal Clarke at patients initiation/request on 10/19/2018 at  2:30 PM EDT by telephone and verified that I am speaking with the correct person using two identifiers.  Pt MRN:  [id number] Pt DOB:  01-10-1936   History of Present Illness: This is a 83 year-old female returning with new complains of severe low back pain with burning and stinging pain into the legs, which limits her ability to stand and walk.  Pain involves her back, legs, and feet.  She does not have leg weakness, however feels that they are very heavy.  She is starting to have more spells of hard sneezing episodes as well as discomfort and headaches.     Assessment and Plan:   Lumbosacral canal stenosis with possible neurogenic claudication - new  - MRI lumbar spine without contrast  - Increase Lyrica to 100mg  three times daily  - Referral for home physical therapy   Cervicalgia, continue tizanidine 2 mg twice daily  Follow Up Instructions:   I discussed the assessment and treatment plan with the patient. The patient was provided an opportunity to ask questions and all were answered. The patient agreed with the plan and demonstrated an understanding of the instructions.   The patient was advised to call back or seek an in-person evaluation if the  symptoms worsen or if the condition fails to improve as anticipated.  Further recommendations pending results.  Total Time spent in visit with the patient was:  22 min, of which 100% of the time was spent in counseling and/or coordinating care.   Pt understands and agrees with the plan of care outlined.     Alda Berthold, DO

## 2018-10-19 NOTE — Telephone Encounter (Signed)
Left message for Cecille Rubin at [facility] to call if any questions needed.

## 2018-10-21 ENCOUNTER — Other Ambulatory Visit: Payer: Self-pay

## 2018-10-21 DIAGNOSIS — I110 Hypertensive heart disease with heart failure: Secondary | ICD-10-CM | POA: Diagnosis not present

## 2018-10-21 DIAGNOSIS — H548 Legal blindness, as defined in USA: Secondary | ICD-10-CM | POA: Diagnosis not present

## 2018-10-21 DIAGNOSIS — Z7901 Long term (current) use of anticoagulants: Secondary | ICD-10-CM | POA: Diagnosis not present

## 2018-10-21 DIAGNOSIS — H409 Unspecified glaucoma: Secondary | ICD-10-CM | POA: Diagnosis not present

## 2018-10-21 DIAGNOSIS — M3500 Sicca syndrome, unspecified: Secondary | ICD-10-CM | POA: Diagnosis not present

## 2018-10-21 DIAGNOSIS — M1711 Unilateral primary osteoarthritis, right knee: Secondary | ICD-10-CM | POA: Diagnosis not present

## 2018-10-21 DIAGNOSIS — G4733 Obstructive sleep apnea (adult) (pediatric): Secondary | ICD-10-CM | POA: Diagnosis not present

## 2018-10-21 DIAGNOSIS — K219 Gastro-esophageal reflux disease without esophagitis: Secondary | ICD-10-CM | POA: Diagnosis not present

## 2018-10-21 DIAGNOSIS — G40909 Epilepsy, unspecified, not intractable, without status epilepticus: Secondary | ICD-10-CM | POA: Diagnosis not present

## 2018-10-21 DIAGNOSIS — Z8701 Personal history of pneumonia (recurrent): Secondary | ICD-10-CM | POA: Diagnosis not present

## 2018-10-21 DIAGNOSIS — I5032 Chronic diastolic (congestive) heart failure: Secondary | ICD-10-CM | POA: Diagnosis not present

## 2018-10-21 DIAGNOSIS — G629 Polyneuropathy, unspecified: Secondary | ICD-10-CM | POA: Diagnosis not present

## 2018-10-21 DIAGNOSIS — M5441 Lumbago with sciatica, right side: Secondary | ICD-10-CM | POA: Diagnosis not present

## 2018-10-21 DIAGNOSIS — E785 Hyperlipidemia, unspecified: Secondary | ICD-10-CM | POA: Diagnosis not present

## 2018-10-21 DIAGNOSIS — Z8673 Personal history of transient ischemic attack (TIA), and cerebral infarction without residual deficits: Secondary | ICD-10-CM | POA: Diagnosis not present

## 2018-10-21 DIAGNOSIS — M19012 Primary osteoarthritis, left shoulder: Secondary | ICD-10-CM | POA: Diagnosis not present

## 2018-10-21 DIAGNOSIS — E119 Type 2 diabetes mellitus without complications: Secondary | ICD-10-CM | POA: Diagnosis not present

## 2018-10-21 DIAGNOSIS — I480 Paroxysmal atrial fibrillation: Secondary | ICD-10-CM | POA: Diagnosis not present

## 2018-10-21 DIAGNOSIS — Z87891 Personal history of nicotine dependence: Secondary | ICD-10-CM | POA: Diagnosis not present

## 2018-10-21 DIAGNOSIS — M810 Age-related osteoporosis without current pathological fracture: Secondary | ICD-10-CM | POA: Diagnosis not present

## 2018-10-21 DIAGNOSIS — M5442 Lumbago with sciatica, left side: Secondary | ICD-10-CM | POA: Diagnosis not present

## 2018-10-27 DIAGNOSIS — M19012 Primary osteoarthritis, left shoulder: Secondary | ICD-10-CM | POA: Diagnosis not present

## 2018-10-27 DIAGNOSIS — M3500 Sicca syndrome, unspecified: Secondary | ICD-10-CM | POA: Diagnosis not present

## 2018-10-27 DIAGNOSIS — G629 Polyneuropathy, unspecified: Secondary | ICD-10-CM | POA: Diagnosis not present

## 2018-10-27 DIAGNOSIS — M1711 Unilateral primary osteoarthritis, right knee: Secondary | ICD-10-CM | POA: Diagnosis not present

## 2018-10-27 DIAGNOSIS — M5441 Lumbago with sciatica, right side: Secondary | ICD-10-CM | POA: Diagnosis not present

## 2018-10-27 DIAGNOSIS — M5442 Lumbago with sciatica, left side: Secondary | ICD-10-CM | POA: Diagnosis not present

## 2018-10-29 DIAGNOSIS — M3500 Sicca syndrome, unspecified: Secondary | ICD-10-CM | POA: Diagnosis not present

## 2018-10-29 DIAGNOSIS — M5442 Lumbago with sciatica, left side: Secondary | ICD-10-CM | POA: Diagnosis not present

## 2018-10-29 DIAGNOSIS — G629 Polyneuropathy, unspecified: Secondary | ICD-10-CM | POA: Diagnosis not present

## 2018-10-29 DIAGNOSIS — M5441 Lumbago with sciatica, right side: Secondary | ICD-10-CM | POA: Diagnosis not present

## 2018-10-29 DIAGNOSIS — M1711 Unilateral primary osteoarthritis, right knee: Secondary | ICD-10-CM | POA: Diagnosis not present

## 2018-10-29 DIAGNOSIS — M19012 Primary osteoarthritis, left shoulder: Secondary | ICD-10-CM | POA: Diagnosis not present

## 2018-11-01 ENCOUNTER — Other Ambulatory Visit: Payer: Self-pay | Admitting: Neurology

## 2018-11-03 DIAGNOSIS — M5441 Lumbago with sciatica, right side: Secondary | ICD-10-CM | POA: Diagnosis not present

## 2018-11-03 DIAGNOSIS — M1711 Unilateral primary osteoarthritis, right knee: Secondary | ICD-10-CM | POA: Diagnosis not present

## 2018-11-03 DIAGNOSIS — G629 Polyneuropathy, unspecified: Secondary | ICD-10-CM | POA: Diagnosis not present

## 2018-11-03 DIAGNOSIS — M19012 Primary osteoarthritis, left shoulder: Secondary | ICD-10-CM | POA: Diagnosis not present

## 2018-11-03 DIAGNOSIS — M5442 Lumbago with sciatica, left side: Secondary | ICD-10-CM | POA: Diagnosis not present

## 2018-11-03 DIAGNOSIS — M3500 Sicca syndrome, unspecified: Secondary | ICD-10-CM | POA: Diagnosis not present

## 2018-11-05 DIAGNOSIS — M3500 Sicca syndrome, unspecified: Secondary | ICD-10-CM | POA: Diagnosis not present

## 2018-11-05 DIAGNOSIS — M5442 Lumbago with sciatica, left side: Secondary | ICD-10-CM | POA: Diagnosis not present

## 2018-11-05 DIAGNOSIS — M19012 Primary osteoarthritis, left shoulder: Secondary | ICD-10-CM | POA: Diagnosis not present

## 2018-11-05 DIAGNOSIS — G629 Polyneuropathy, unspecified: Secondary | ICD-10-CM | POA: Diagnosis not present

## 2018-11-05 DIAGNOSIS — M5441 Lumbago with sciatica, right side: Secondary | ICD-10-CM | POA: Diagnosis not present

## 2018-11-05 DIAGNOSIS — M1711 Unilateral primary osteoarthritis, right knee: Secondary | ICD-10-CM | POA: Diagnosis not present

## 2018-11-10 DIAGNOSIS — R110 Nausea: Secondary | ICD-10-CM | POA: Diagnosis not present

## 2018-11-10 DIAGNOSIS — G629 Polyneuropathy, unspecified: Secondary | ICD-10-CM | POA: Diagnosis not present

## 2018-11-10 DIAGNOSIS — M19012 Primary osteoarthritis, left shoulder: Secondary | ICD-10-CM | POA: Diagnosis not present

## 2018-11-10 DIAGNOSIS — M5441 Lumbago with sciatica, right side: Secondary | ICD-10-CM | POA: Diagnosis not present

## 2018-11-10 DIAGNOSIS — M1711 Unilateral primary osteoarthritis, right knee: Secondary | ICD-10-CM | POA: Diagnosis not present

## 2018-11-10 DIAGNOSIS — M5442 Lumbago with sciatica, left side: Secondary | ICD-10-CM | POA: Diagnosis not present

## 2018-11-10 DIAGNOSIS — M3500 Sicca syndrome, unspecified: Secondary | ICD-10-CM | POA: Diagnosis not present

## 2018-11-10 DIAGNOSIS — R1030 Lower abdominal pain, unspecified: Secondary | ICD-10-CM | POA: Diagnosis not present

## 2018-11-10 DIAGNOSIS — K5901 Slow transit constipation: Secondary | ICD-10-CM | POA: Diagnosis not present

## 2018-11-12 DIAGNOSIS — M5442 Lumbago with sciatica, left side: Secondary | ICD-10-CM | POA: Diagnosis not present

## 2018-11-12 DIAGNOSIS — M1711 Unilateral primary osteoarthritis, right knee: Secondary | ICD-10-CM | POA: Diagnosis not present

## 2018-11-12 DIAGNOSIS — M5441 Lumbago with sciatica, right side: Secondary | ICD-10-CM | POA: Diagnosis not present

## 2018-11-12 DIAGNOSIS — G629 Polyneuropathy, unspecified: Secondary | ICD-10-CM | POA: Diagnosis not present

## 2018-11-12 DIAGNOSIS — M19012 Primary osteoarthritis, left shoulder: Secondary | ICD-10-CM | POA: Diagnosis not present

## 2018-11-12 DIAGNOSIS — M3500 Sicca syndrome, unspecified: Secondary | ICD-10-CM | POA: Diagnosis not present

## 2018-11-16 DIAGNOSIS — E1143 Type 2 diabetes mellitus with diabetic autonomic (poly)neuropathy: Secondary | ICD-10-CM | POA: Diagnosis not present

## 2018-11-16 DIAGNOSIS — I480 Paroxysmal atrial fibrillation: Secondary | ICD-10-CM | POA: Diagnosis not present

## 2018-11-16 DIAGNOSIS — M47816 Spondylosis without myelopathy or radiculopathy, lumbar region: Secondary | ICD-10-CM | POA: Diagnosis not present

## 2018-11-16 DIAGNOSIS — E785 Hyperlipidemia, unspecified: Secondary | ICD-10-CM | POA: Diagnosis not present

## 2018-11-16 DIAGNOSIS — M5441 Lumbago with sciatica, right side: Secondary | ICD-10-CM | POA: Diagnosis not present

## 2018-11-16 DIAGNOSIS — M5442 Lumbago with sciatica, left side: Secondary | ICD-10-CM | POA: Diagnosis not present

## 2018-11-16 DIAGNOSIS — M1711 Unilateral primary osteoarthritis, right knee: Secondary | ICD-10-CM | POA: Diagnosis not present

## 2018-11-16 DIAGNOSIS — M3500 Sicca syndrome, unspecified: Secondary | ICD-10-CM | POA: Diagnosis not present

## 2018-11-16 DIAGNOSIS — G629 Polyneuropathy, unspecified: Secondary | ICD-10-CM | POA: Diagnosis not present

## 2018-11-16 DIAGNOSIS — M19012 Primary osteoarthritis, left shoulder: Secondary | ICD-10-CM | POA: Diagnosis not present

## 2018-11-17 ENCOUNTER — Other Ambulatory Visit: Payer: Self-pay | Admitting: Neurology

## 2018-11-18 ENCOUNTER — Ambulatory Visit
Admission: RE | Admit: 2018-11-18 | Discharge: 2018-11-18 | Disposition: A | Discharge status: Home / Self Care | Payer: Medicare Other | Source: Ambulatory Visit | Attending: Neurology | Admitting: Neurology

## 2018-11-18 ENCOUNTER — Other Ambulatory Visit: Payer: Self-pay

## 2018-11-18 DIAGNOSIS — M5442 Lumbago with sciatica, left side: Secondary | ICD-10-CM

## 2018-11-18 DIAGNOSIS — M48062 Spinal stenosis, lumbar region with neurogenic claudication: Secondary | ICD-10-CM

## 2018-11-18 DIAGNOSIS — R269 Unspecified abnormalities of gait and mobility: Secondary | ICD-10-CM

## 2018-11-18 DIAGNOSIS — M5117 Intervertebral disc disorders with radiculopathy, lumbosacral region: Secondary | ICD-10-CM | POA: Diagnosis not present

## 2018-11-18 DIAGNOSIS — G8929 Other chronic pain: Secondary | ICD-10-CM

## 2018-11-18 DIAGNOSIS — M48061 Spinal stenosis, lumbar region without neurogenic claudication: Secondary | ICD-10-CM | POA: Diagnosis not present

## 2018-11-18 DIAGNOSIS — G9519 Other vascular myelopathies: Secondary | ICD-10-CM

## 2018-11-19 ENCOUNTER — Telehealth: Payer: Self-pay | Admitting: Neurology

## 2018-11-19 MED ORDER — PREGABALIN 150 MG PO CAPS: 150.0000 mg | ORAL_CAPSULE | Freq: Three times a day (TID) | ORAL | 5 refills | Status: DC

## 2018-11-19 NOTE — Telephone Encounter (Signed)
Results of MRI discussed with patient which shows degenerative lumbar spondylosis, worse at L4-5 where there is moderate to severe canal stenosis.  There is also multilevel foraminal stenosis, worse at right L3-4 and bilateral L4-5 lateral recess.  She continues to have ongoing severe low back pain with radicular pain down her knee and into the lower leg.  No benefit with increasing Lyrica to 100mg  TID at her last visit.  She denies any side effects to Lyrica.    Renal function is normal, so there is room to increase Lyrica further to Lyrica to 150mg  TID.  She is doing PT with [facility].   I also discussed option of epidural steroid injection. She had this in 2019 by Dr. Nelva Bush and did not have any benefit, so is reluctant to try it again.   She is not a candidate for surgery given multiple medical comorbidities.   Darek Eifler K. Posey Pronto, DO

## 2018-11-20 DIAGNOSIS — G40909 Epilepsy, unspecified, not intractable, without status epilepticus: Secondary | ICD-10-CM | POA: Diagnosis not present

## 2018-11-20 DIAGNOSIS — Z8673 Personal history of transient ischemic attack (TIA), and cerebral infarction without residual deficits: Secondary | ICD-10-CM | POA: Diagnosis not present

## 2018-11-20 DIAGNOSIS — I110 Hypertensive heart disease with heart failure: Secondary | ICD-10-CM | POA: Diagnosis not present

## 2018-11-20 DIAGNOSIS — Z7901 Long term (current) use of anticoagulants: Secondary | ICD-10-CM | POA: Diagnosis not present

## 2018-11-20 DIAGNOSIS — H409 Unspecified glaucoma: Secondary | ICD-10-CM | POA: Diagnosis not present

## 2018-11-20 DIAGNOSIS — Z87891 Personal history of nicotine dependence: Secondary | ICD-10-CM | POA: Diagnosis not present

## 2018-11-20 DIAGNOSIS — I5032 Chronic diastolic (congestive) heart failure: Secondary | ICD-10-CM | POA: Diagnosis not present

## 2018-11-20 DIAGNOSIS — M1711 Unilateral primary osteoarthritis, right knee: Secondary | ICD-10-CM | POA: Diagnosis not present

## 2018-11-20 DIAGNOSIS — K219 Gastro-esophageal reflux disease without esophagitis: Secondary | ICD-10-CM | POA: Diagnosis not present

## 2018-11-20 DIAGNOSIS — G629 Polyneuropathy, unspecified: Secondary | ICD-10-CM | POA: Diagnosis not present

## 2018-11-20 DIAGNOSIS — M810 Age-related osteoporosis without current pathological fracture: Secondary | ICD-10-CM | POA: Diagnosis not present

## 2018-11-20 DIAGNOSIS — M19012 Primary osteoarthritis, left shoulder: Secondary | ICD-10-CM | POA: Diagnosis not present

## 2018-11-20 DIAGNOSIS — M3500 Sicca syndrome, unspecified: Secondary | ICD-10-CM | POA: Diagnosis not present

## 2018-11-20 DIAGNOSIS — I480 Paroxysmal atrial fibrillation: Secondary | ICD-10-CM | POA: Diagnosis not present

## 2018-11-20 DIAGNOSIS — M5442 Lumbago with sciatica, left side: Secondary | ICD-10-CM | POA: Diagnosis not present

## 2018-11-20 DIAGNOSIS — Z8701 Personal history of pneumonia (recurrent): Secondary | ICD-10-CM | POA: Diagnosis not present

## 2018-11-20 DIAGNOSIS — G4733 Obstructive sleep apnea (adult) (pediatric): Secondary | ICD-10-CM | POA: Diagnosis not present

## 2018-11-20 DIAGNOSIS — E785 Hyperlipidemia, unspecified: Secondary | ICD-10-CM | POA: Diagnosis not present

## 2018-11-20 DIAGNOSIS — E119 Type 2 diabetes mellitus without complications: Secondary | ICD-10-CM | POA: Diagnosis not present

## 2018-11-20 DIAGNOSIS — H548 Legal blindness, as defined in USA: Secondary | ICD-10-CM | POA: Diagnosis not present

## 2018-11-20 DIAGNOSIS — M5441 Lumbago with sciatica, right side: Secondary | ICD-10-CM | POA: Diagnosis not present

## 2018-11-24 DIAGNOSIS — M3500 Sicca syndrome, unspecified: Secondary | ICD-10-CM | POA: Diagnosis not present

## 2018-11-24 DIAGNOSIS — M19012 Primary osteoarthritis, left shoulder: Secondary | ICD-10-CM | POA: Diagnosis not present

## 2018-11-24 DIAGNOSIS — G629 Polyneuropathy, unspecified: Secondary | ICD-10-CM | POA: Diagnosis not present

## 2018-11-24 DIAGNOSIS — M5442 Lumbago with sciatica, left side: Secondary | ICD-10-CM | POA: Diagnosis not present

## 2018-11-24 DIAGNOSIS — M5441 Lumbago with sciatica, right side: Secondary | ICD-10-CM | POA: Diagnosis not present

## 2018-11-24 DIAGNOSIS — M1711 Unilateral primary osteoarthritis, right knee: Secondary | ICD-10-CM | POA: Diagnosis not present

## 2018-12-01 ENCOUNTER — Other Ambulatory Visit: Payer: Self-pay | Admitting: Neurology

## 2018-12-03 DIAGNOSIS — M1711 Unilateral primary osteoarthritis, right knee: Secondary | ICD-10-CM | POA: Diagnosis not present

## 2018-12-03 DIAGNOSIS — M5441 Lumbago with sciatica, right side: Secondary | ICD-10-CM | POA: Diagnosis not present

## 2018-12-03 DIAGNOSIS — M3500 Sicca syndrome, unspecified: Secondary | ICD-10-CM | POA: Diagnosis not present

## 2018-12-03 DIAGNOSIS — G629 Polyneuropathy, unspecified: Secondary | ICD-10-CM | POA: Diagnosis not present

## 2018-12-03 DIAGNOSIS — M19012 Primary osteoarthritis, left shoulder: Secondary | ICD-10-CM | POA: Diagnosis not present

## 2018-12-03 DIAGNOSIS — M5442 Lumbago with sciatica, left side: Secondary | ICD-10-CM | POA: Diagnosis not present

## 2018-12-08 DIAGNOSIS — M5442 Lumbago with sciatica, left side: Secondary | ICD-10-CM | POA: Diagnosis not present

## 2018-12-08 DIAGNOSIS — M19012 Primary osteoarthritis, left shoulder: Secondary | ICD-10-CM | POA: Diagnosis not present

## 2018-12-08 DIAGNOSIS — M3500 Sicca syndrome, unspecified: Secondary | ICD-10-CM | POA: Diagnosis not present

## 2018-12-08 DIAGNOSIS — G629 Polyneuropathy, unspecified: Secondary | ICD-10-CM | POA: Diagnosis not present

## 2018-12-08 DIAGNOSIS — M1711 Unilateral primary osteoarthritis, right knee: Secondary | ICD-10-CM | POA: Diagnosis not present

## 2018-12-08 DIAGNOSIS — M5441 Lumbago with sciatica, right side: Secondary | ICD-10-CM | POA: Diagnosis not present

## 2018-12-10 DIAGNOSIS — I480 Paroxysmal atrial fibrillation: Secondary | ICD-10-CM | POA: Diagnosis not present

## 2018-12-10 DIAGNOSIS — R110 Nausea: Secondary | ICD-10-CM | POA: Diagnosis not present

## 2018-12-10 DIAGNOSIS — K5901 Slow transit constipation: Secondary | ICD-10-CM | POA: Diagnosis not present

## 2018-12-10 DIAGNOSIS — G479 Sleep disorder, unspecified: Secondary | ICD-10-CM | POA: Diagnosis not present

## 2018-12-10 DIAGNOSIS — M47816 Spondylosis without myelopathy or radiculopathy, lumbar region: Secondary | ICD-10-CM | POA: Diagnosis not present

## 2018-12-15 DIAGNOSIS — M5442 Lumbago with sciatica, left side: Secondary | ICD-10-CM | POA: Diagnosis not present

## 2018-12-15 DIAGNOSIS — G629 Polyneuropathy, unspecified: Secondary | ICD-10-CM | POA: Diagnosis not present

## 2018-12-15 DIAGNOSIS — M1711 Unilateral primary osteoarthritis, right knee: Secondary | ICD-10-CM | POA: Diagnosis not present

## 2018-12-15 DIAGNOSIS — M19012 Primary osteoarthritis, left shoulder: Secondary | ICD-10-CM | POA: Diagnosis not present

## 2018-12-15 DIAGNOSIS — M3500 Sicca syndrome, unspecified: Secondary | ICD-10-CM | POA: Diagnosis not present

## 2018-12-15 DIAGNOSIS — M5441 Lumbago with sciatica, right side: Secondary | ICD-10-CM | POA: Diagnosis not present

## 2018-12-16 ENCOUNTER — Other Ambulatory Visit: Payer: Self-pay | Admitting: Family Medicine

## 2018-12-16 ENCOUNTER — Other Ambulatory Visit: Payer: Self-pay

## 2018-12-16 ENCOUNTER — Ambulatory Visit
Admission: RE | Admit: 2018-12-16 | Discharge: 2018-12-16 | Disposition: A | Discharge status: Home / Self Care | Payer: Medicare Other | Source: Ambulatory Visit | Attending: Family Medicine | Admitting: Family Medicine

## 2018-12-16 DIAGNOSIS — M1711 Unilateral primary osteoarthritis, right knee: Secondary | ICD-10-CM | POA: Diagnosis not present

## 2018-12-16 DIAGNOSIS — I480 Paroxysmal atrial fibrillation: Secondary | ICD-10-CM | POA: Diagnosis not present

## 2018-12-16 DIAGNOSIS — R5383 Other fatigue: Secondary | ICD-10-CM

## 2018-12-16 DIAGNOSIS — R61 Generalized hyperhidrosis: Secondary | ICD-10-CM | POA: Diagnosis not present

## 2018-12-16 DIAGNOSIS — M25512 Pain in left shoulder: Secondary | ICD-10-CM | POA: Diagnosis not present

## 2018-12-16 DIAGNOSIS — R110 Nausea: Secondary | ICD-10-CM | POA: Diagnosis not present

## 2018-12-16 DIAGNOSIS — R531 Weakness: Secondary | ICD-10-CM | POA: Diagnosis not present

## 2018-12-16 DIAGNOSIS — G4710 Hypersomnia, unspecified: Secondary | ICD-10-CM | POA: Diagnosis not present

## 2018-12-22 DIAGNOSIS — K5901 Slow transit constipation: Secondary | ICD-10-CM | POA: Diagnosis not present

## 2018-12-22 DIAGNOSIS — R159 Full incontinence of feces: Secondary | ICD-10-CM | POA: Diagnosis not present

## 2018-12-22 DIAGNOSIS — R152 Fecal urgency: Secondary | ICD-10-CM | POA: Diagnosis not present

## 2018-12-23 DIAGNOSIS — H401133 Primary open-angle glaucoma, bilateral, severe stage: Secondary | ICD-10-CM | POA: Diagnosis not present

## 2018-12-23 DIAGNOSIS — H18893 Other specified disorders of cornea, bilateral: Secondary | ICD-10-CM | POA: Diagnosis not present

## 2018-12-23 DIAGNOSIS — Z961 Presence of intraocular lens: Secondary | ICD-10-CM | POA: Diagnosis not present

## 2018-12-23 DIAGNOSIS — M3500 Sicca syndrome, unspecified: Secondary | ICD-10-CM | POA: Diagnosis not present

## 2018-12-23 DIAGNOSIS — M3501 Sicca syndrome with keratoconjunctivitis: Secondary | ICD-10-CM | POA: Diagnosis not present

## 2018-12-23 DIAGNOSIS — H02403 Unspecified ptosis of bilateral eyelids: Secondary | ICD-10-CM | POA: Diagnosis not present

## 2018-12-29 ENCOUNTER — Other Ambulatory Visit: Payer: Self-pay | Admitting: Neurology

## 2019-01-10 ENCOUNTER — Other Ambulatory Visit: Payer: Self-pay | Admitting: Cardiology

## 2019-01-11 NOTE — Telephone Encounter (Signed)
Refill request

## 2019-01-12 ENCOUNTER — Other Ambulatory Visit: Payer: Self-pay | Admitting: Neurology

## 2019-02-10 DIAGNOSIS — H401133 Primary open-angle glaucoma, bilateral, severe stage: Secondary | ICD-10-CM | POA: Diagnosis not present

## 2019-02-19 DIAGNOSIS — M1711 Unilateral primary osteoarthritis, right knee: Secondary | ICD-10-CM | POA: Diagnosis not present

## 2019-02-19 DIAGNOSIS — M1712 Unilateral primary osteoarthritis, left knee: Secondary | ICD-10-CM | POA: Diagnosis not present

## 2019-02-19 DIAGNOSIS — M25561 Pain in right knee: Secondary | ICD-10-CM | POA: Diagnosis not present

## 2019-02-19 DIAGNOSIS — M170 Bilateral primary osteoarthritis of knee: Secondary | ICD-10-CM | POA: Diagnosis not present

## 2019-02-19 DIAGNOSIS — M25562 Pain in left knee: Secondary | ICD-10-CM | POA: Diagnosis not present

## 2019-02-22 NOTE — Progress Notes (Signed)
HPI: FU atrial fibrillation. She has a hx of Sjogren's syndrome, seizure disorder, HTN, HL, peripheral neuropathy. She was admitted 9/14 after presenting with overall functional decline, cough and generalized weakness and malaise. She was found to be in atrial fibrillation with RVR. CHADS2-VASc=4. She was placed on Xarelto for anticoagulation. She converted to sinus rhythm. MRI in October of 2014 showed a very small acute/subacute left thalamic infarct. Patient found to be in recurrent atrial fibrillation 8/16. Nuclear study 8/16 showed EF 65 and no ischemia. Patient declined cardioversion previously and her amiodarone was discontinued. Holter monitor February 2017 showed atrial fibrillation with PVCs or aberrantly conducted beats rate controlled. Showed 1-39% bilateral stenosis. Echocardiogram March 2019 showed normal LV function, mild mitral regurgitation, moderate left atrial enlargement, mild right ventricular enlargement. Since last seen,there is no dyspnea, chest pain, palpitations or syncope.  Current Outpatient Medications  Medication Sig Dispense Refill  . cephALEXin (KEFLEX) 500 MG capsule cephalexin 500 mg capsule    . cyclobenzaprine (FLEXERIL) 5 MG tablet TAKE 1 TABLET BY MOUTH AT BEDTIME 90 tablet 3  . diltiazem (CARDIZEM CD) 180 MG 24 hr capsule TAKE 1 CAPSULE (180 MG TOTAL) BY MOUTH DAILY. 90 capsule 3  . dorzolamide (TRUSOPT) 2 % ophthalmic solution Place 1 drop into both eyes 2 (two) times daily.    . dorzolamide-timolol (COSOPT) 22.3-6.8 MG/ML ophthalmic solution     . ELIQUIS 5 MG TABS tablet TAKE 1 TABLET BY MOUTH TWICE A DAY 180 tablet 1  . fenofibrate (TRICOR) 145 MG tablet Take 145 mg by mouth at bedtime.   0  . omeprazole-sodium bicarbonate (ZEGERID) 40-1100 MG capsule     . ondansetron (ZOFRAN) 8 MG tablet TAKE ONE TABLET EVERY 6 HOURS AS NEEDED FOR NAUSEA ORALLY 10 DAYS  0  . pantoprazole (PROTONIX) 40 MG tablet Take 40 mg by mouth daily.    . pregabalin (LYRICA)  150 MG capsule Take 1 capsule (150 mg total) by mouth 3 (three) times daily. 90 capsule 5  . tizanidine (ZANAFLEX) 2 MG capsule TAKE 1 CAPSULE (2 MG TOTAL) BY MOUTH 3 (THREE) TIMES DAILY. 270 capsule 3  . traMADol (ULTRAM) 50 MG tablet 1 TABLET 3 TIMES PER DAY AS NEEDED FOR PAIN ORALLY (INSURANCE MAX 7 DAY SUPPLY)     No current facility-administered medications for this visit.      Past Medical History:  Diagnosis Date  . Acute respiratory failure with hypoxia ([place]) 12/06/2012  . Allergy   . Blindness of right eye   . Complication of anesthesia   . Dehydration with hyponatremia 12/07/2012  . Diabetes mellitus without complication ([place])    diagnosed 2 weeks ago- No CBG meter as of yet.  Marland Kitchen DIVERTICULITIS, HX OF 01/27/2009   Qualifier: Diagnosis of  By: Jerold Coombe    . Gait disorder 06/05/2012  . GERD (gastroesophageal reflux disease)   . Glaucoma   . Glucose intolerance (impaired glucose tolerance)   . History of diverticulitis   . Hypertension   . Legally blind   . Migraine triggered seizures ([place])    history of  . Migraines   . Neuropathy   . OP (osteoporosis)   . Osteoarthritis    osteoarthritis -right knee. uses walker. Left shoulder -"Limited ROM"  . Paroxysmal atrial fibrillation ([place]) 12/06/2012   Echo (12/07/12): Moderate focal basal hypertrophy of the septum, vigorous LV function, EF 65-70%, indeterminate diastolic function, normal wall motion, trivial MR, moderate LAE, trivial TR;  Xarelto started 11/2012  .  Peripheral neuropathy    neuropathy  . Pneumonia   . PONV (postoperative nausea and vomiting)    no problems other past few surgeries  . SHINGLES, HX OF 07/17/2009   Qualifier: Diagnosis of  By: Jerold Coombe    . Siriasis   . Sjogren's syndrome ([place])    bilateral vision  . Sleep apnea    does not wear CPAP  . Stroke [facility])    residual - rare Intermittent expressive aphasia,    Past Surgical History:  Procedure Laterality Date  . BONE TUMOR EXCISION  Right    arm  . CATARACT EXTRACTION    . COLONOSCOPY WITH PROPOFOL N/A 06/09/2015   Procedure: COLONOSCOPY WITH PROPOFOL;  Surgeon: Ronald Lobo, MD;  Location: [place];  Service: Endoscopy;  Laterality: N/A;  . DILATION AND CURETTAGE OF UTERUS    . ESOPHAGOGASTRODUODENOSCOPY N/A 04/17/2016   Procedure: ESOPHAGOGASTRODUODENOSCOPY (EGD);  Surgeon: Teena Irani, MD;  Location: [facility] ENDOSCOPY;  Service: Endoscopy;  Laterality: N/A;  . ESOPHAGOGASTRODUODENOSCOPY (EGD) WITH PROPOFOL N/A 06/09/2015   Procedure: ESOPHAGOGASTRODUODENOSCOPY (EGD) WITH PROPOFOL;  Surgeon: Ronald Lobo, MD;  Location: [facility] ENDOSCOPY;  Service: Endoscopy;  Laterality: N/A;  . EYE SURGERY    . FOOT FRACTURE SURGERY Left 2007  . GIVENS CAPSULE STUDY N/A 04/17/2016   Procedure: GIVENS CAPSULE STUDY;  Surgeon: Teena Irani, MD;  Location: [place];  Service: Endoscopy;  Laterality: N/A;  . KNEE ARTHROSCOPY Right   . LUMBAR LAMINECTOMY    . ROBOT ASSISTED LAPAROSCOPIC PARTIAL COLECTOMY  02/07/2016   Procedure: ROBOT ASSISTED LAPAROSCOPIC PARTIAL COLECTOMY;  Surgeon: Leighton Ruff, MD;  [facility];  Service: General;;  . SHOULDER OPEN ROTATOR CUFF REPAIR    . TONSILLECTOMY AND ADENOIDECTOMY    . TOTAL ABDOMINAL HYSTERECTOMY W/ BILATERAL SALPINGOOPHORECTOMY      Social History   Socioeconomic History  . Marital status: Married    Spouse name: jesse  . Number of children: 2  . Years of education: college  . Highest education level: Not on file  Occupational History  . Occupation: retired  Scientific laboratory technician  . Financial resource strain: Not on file  . Food insecurity    Worry: Not on file    Inability: Not on file  . Transportation needs    Medical: Not on file    Non-medical: Not on file  Tobacco Use  . Smoking status: Former Smoker    Types: Cigarettes  . Smokeless tobacco: Never Used  . Tobacco comment: quit smoking before age of 32  Substance and Sexual Activity  . Alcohol use: No    Alcohol/week:  0.0 standard drinks  . Drug use: No  . Sexual activity: Not on file  Lifestyle  . Physical activity    Days per week: Not on file    Minutes per session: Not on file  . Stress: Not on file  Relationships  . Social Herbalist on phone: Not on file    Gets together: Not on file    Attends religious service: Not on file    Active member of club or organization: Not on file    Attends meetings of clubs or organizations: Not on file    Relationship status: Not on file  . Intimate partner violence    Fear of current or ex partner: Not on file    Emotionally abused: Not on file    Physically abused: Not on file    Forced sexual activity: Not on file  Other Topics Concern  . Not on file  Social History Narrative   She lives with husband of 23 years.  They have a one story home.  One daughter.    Right handed     Family History  Problem Relation Age of Onset  . Stroke Mother   . Migraines Mother   . Stomach cancer Father   . Sjogren's syndrome Father   . Sjogren's syndrome Brother   . Sjogren's syndrome Brother   . Scleroderma Sister        raynaud's scleroderma  . Lymphoma Daughter   . Migraines Daughter   . Heart disease Other        maternal side  . Stroke Other        maternal side, 1st degree female <60    ROS: no fevers or chills, productive cough, hemoptysis, dysphasia, odynophagia, melena, hematochezia, dysuria, hematuria, rash, seizure activity, orthopnea, PND, pedal edema, claudication. Remaining systems are negative.  Physical Exam: Well-developed frail in no acute distress.  Skin is warm and dry.  HEENT is normal.  Neck is supple.  Chest is clear to auscultation with normal expansion.  Cardiovascular exam is irregular Abdominal exam nontender or distended. No masses palpated. Extremities show no edema. neuro grossly intact  ECG-atrial fibrillation at a rate of 83, cannot rule out septal infarct, nonspecific ST changes.  Personally reviewed   A/P  1 permanent atrial fibrillation-patient previously declined cardioversion.  Continue Cardizem at present dose for rate control.  Continue apixaban.  2 hypertension-blood pressure controlled.  Continue present medical regimen.  3 chronic diastolic congestive heart failure-patient appears to be euvolemic today on examination.  Continue fluid restriction and low-sodium diet.  4 hyperlipidemia-followed by primary care.  Kirk Ruths, MD

## 2019-02-25 ENCOUNTER — Encounter: Payer: Self-pay | Admitting: Cardiology

## 2019-02-25 ENCOUNTER — Other Ambulatory Visit: Payer: Self-pay

## 2019-02-25 ENCOUNTER — Ambulatory Visit (INDEPENDENT_AMBULATORY_CARE_PROVIDER_SITE_OTHER): Payer: Medicare Other | Admitting: Cardiology

## 2019-02-25 VITALS — BP 110/70 | HR 83 | Temp 97.0°F | Ht 60.0 in | Wt 177.0 lb

## 2019-02-25 DIAGNOSIS — I5032 Chronic diastolic (congestive) heart failure: Secondary | ICD-10-CM | POA: Diagnosis not present

## 2019-02-25 DIAGNOSIS — I10 Essential (primary) hypertension: Secondary | ICD-10-CM

## 2019-02-25 DIAGNOSIS — I4821 Permanent atrial fibrillation: Secondary | ICD-10-CM

## 2019-02-25 NOTE — Patient Instructions (Signed)
Medication Instructions:  NO CHANGE *If you need a refill on your cardiac medications before your next appointment, please call your pharmacy*  Lab Work: If you have labs (blood work) drawn today and your tests are completely normal, you will receive your results only by: . MyChart Message (if you have MyChart) OR . A paper copy in the mail If you have any lab test that is abnormal or we need to change your treatment, we will call you to review the results.  Follow-Up: At [facility], you and your health needs are our priority.  As part of our continuing mission to provide you with exceptional heart care, we have created designated Provider Care Teams.  These Care Teams include your primary Cardiologist (physician) and Advanced Practice Providers (APPs -  Physician Assistants and Nurse Practitioners) who all work together to provide you with the care you need, when you need it.  Your next appointment:   12 month(s)  The format for your next appointment:   Either In Person or Virtual  Provider:   You may see Brian Crenshaw, MD or one of the following Advanced Practice Providers on your designated Care Team:    Luke Kilroy, PA-C  Callie Goodrich, PA-C  Jesse Cleaver, FNP    

## 2019-02-26 DIAGNOSIS — M25562 Pain in left knee: Secondary | ICD-10-CM | POA: Diagnosis not present

## 2019-02-26 DIAGNOSIS — M170 Bilateral primary osteoarthritis of knee: Secondary | ICD-10-CM | POA: Diagnosis not present

## 2019-02-26 DIAGNOSIS — M25561 Pain in right knee: Secondary | ICD-10-CM | POA: Diagnosis not present

## 2019-03-06 ENCOUNTER — Other Ambulatory Visit: Payer: Self-pay | Admitting: Cardiology

## 2019-03-06 DIAGNOSIS — I4891 Unspecified atrial fibrillation: Secondary | ICD-10-CM

## 2019-04-16 DIAGNOSIS — I480 Paroxysmal atrial fibrillation: Secondary | ICD-10-CM | POA: Diagnosis not present

## 2019-04-16 DIAGNOSIS — M47816 Spondylosis without myelopathy or radiculopathy, lumbar region: Secondary | ICD-10-CM | POA: Diagnosis not present

## 2019-04-16 DIAGNOSIS — E785 Hyperlipidemia, unspecified: Secondary | ICD-10-CM | POA: Diagnosis not present

## 2019-04-16 DIAGNOSIS — M1711 Unilateral primary osteoarthritis, right knee: Secondary | ICD-10-CM | POA: Diagnosis not present

## 2019-04-16 DIAGNOSIS — E1143 Type 2 diabetes mellitus with diabetic autonomic (poly)neuropathy: Secondary | ICD-10-CM | POA: Diagnosis not present

## 2019-04-23 DIAGNOSIS — M47816 Spondylosis without myelopathy or radiculopathy, lumbar region: Secondary | ICD-10-CM | POA: Diagnosis not present

## 2019-04-23 DIAGNOSIS — E1143 Type 2 diabetes mellitus with diabetic autonomic (poly)neuropathy: Secondary | ICD-10-CM | POA: Diagnosis not present

## 2019-04-23 DIAGNOSIS — E785 Hyperlipidemia, unspecified: Secondary | ICD-10-CM | POA: Diagnosis not present

## 2019-04-23 DIAGNOSIS — I480 Paroxysmal atrial fibrillation: Secondary | ICD-10-CM | POA: Diagnosis not present

## 2019-04-23 DIAGNOSIS — M1711 Unilateral primary osteoarthritis, right knee: Secondary | ICD-10-CM | POA: Diagnosis not present

## 2019-04-26 DIAGNOSIS — G4733 Obstructive sleep apnea (adult) (pediatric): Secondary | ICD-10-CM | POA: Diagnosis not present

## 2019-04-26 DIAGNOSIS — R5382 Chronic fatigue, unspecified: Secondary | ICD-10-CM | POA: Diagnosis not present

## 2019-04-26 DIAGNOSIS — E785 Hyperlipidemia, unspecified: Secondary | ICD-10-CM | POA: Diagnosis not present

## 2019-04-26 DIAGNOSIS — E538 Deficiency of other specified B group vitamins: Secondary | ICD-10-CM | POA: Diagnosis not present

## 2019-04-26 DIAGNOSIS — E559 Vitamin D deficiency, unspecified: Secondary | ICD-10-CM | POA: Diagnosis not present

## 2019-04-26 DIAGNOSIS — E1143 Type 2 diabetes mellitus with diabetic autonomic (poly)neuropathy: Secondary | ICD-10-CM | POA: Diagnosis not present

## 2019-04-28 DIAGNOSIS — M3501 Sicca syndrome with keratoconjunctivitis: Secondary | ICD-10-CM | POA: Diagnosis not present

## 2019-04-28 DIAGNOSIS — Z961 Presence of intraocular lens: Secondary | ICD-10-CM | POA: Diagnosis not present

## 2019-04-28 DIAGNOSIS — H02403 Unspecified ptosis of bilateral eyelids: Secondary | ICD-10-CM | POA: Diagnosis not present

## 2019-04-28 DIAGNOSIS — M3500 Sicca syndrome, unspecified: Secondary | ICD-10-CM | POA: Diagnosis not present

## 2019-04-28 DIAGNOSIS — H18893 Other specified disorders of cornea, bilateral: Secondary | ICD-10-CM | POA: Diagnosis not present

## 2019-05-04 DIAGNOSIS — Z23 Encounter for immunization: Secondary | ICD-10-CM | POA: Diagnosis not present

## 2019-05-15 ENCOUNTER — Other Ambulatory Visit: Payer: Self-pay | Admitting: Neurology

## 2019-05-17 NOTE — Telephone Encounter (Signed)
Patient is only taken 100mg  lyrica bid.

## 2019-05-17 NOTE — Telephone Encounter (Signed)
Prescription for Lyrica 150mg  TID sent in September. Please confirm with patient whether she is taking Lyrica 100mg  or 150mg  three times daily.    Thanks. DP

## 2019-05-20 ENCOUNTER — Other Ambulatory Visit: Payer: Self-pay | Admitting: Neurology

## 2019-05-20 ENCOUNTER — Telehealth: Payer: Self-pay | Admitting: Neurology

## 2019-05-20 NOTE — Telephone Encounter (Signed)
Can you resend rx in on thursday

## 2019-05-20 NOTE — Telephone Encounter (Signed)
Patient is calling in about the Lyrica 100 mg medication needing a refill to the pharm on file. She said the pharm instructed her to call us. Thanks!

## 2019-05-21 MED ORDER — PREGABALIN 100 MG PO CAPS: 100.0000 mg | ORAL_CAPSULE | Freq: Two times a day (BID) | ORAL | 5 refills | Status: DC

## 2019-05-21 NOTE — Telephone Encounter (Signed)
Rx sent 

## 2019-06-04 DIAGNOSIS — Z23 Encounter for immunization: Secondary | ICD-10-CM | POA: Diagnosis not present

## 2019-06-09 DIAGNOSIS — R159 Full incontinence of feces: Secondary | ICD-10-CM | POA: Diagnosis not present

## 2019-06-09 DIAGNOSIS — R152 Fecal urgency: Secondary | ICD-10-CM | POA: Diagnosis not present

## 2019-06-09 DIAGNOSIS — K5904 Chronic idiopathic constipation: Secondary | ICD-10-CM | POA: Diagnosis not present

## 2019-06-10 DIAGNOSIS — H401133 Primary open-angle glaucoma, bilateral, severe stage: Secondary | ICD-10-CM | POA: Diagnosis not present

## 2019-06-14 DIAGNOSIS — G4700 Insomnia, unspecified: Secondary | ICD-10-CM | POA: Diagnosis not present

## 2019-06-14 DIAGNOSIS — E785 Hyperlipidemia, unspecified: Secondary | ICD-10-CM | POA: Diagnosis not present

## 2019-06-14 DIAGNOSIS — E1143 Type 2 diabetes mellitus with diabetic autonomic (poly)neuropathy: Secondary | ICD-10-CM | POA: Diagnosis not present

## 2019-06-14 DIAGNOSIS — I480 Paroxysmal atrial fibrillation: Secondary | ICD-10-CM | POA: Diagnosis not present

## 2019-06-14 DIAGNOSIS — M47816 Spondylosis without myelopathy or radiculopathy, lumbar region: Secondary | ICD-10-CM | POA: Diagnosis not present

## 2019-06-14 DIAGNOSIS — M1711 Unilateral primary osteoarthritis, right knee: Secondary | ICD-10-CM | POA: Diagnosis not present

## 2019-06-22 DIAGNOSIS — G4700 Insomnia, unspecified: Secondary | ICD-10-CM | POA: Diagnosis not present

## 2019-06-22 DIAGNOSIS — I480 Paroxysmal atrial fibrillation: Secondary | ICD-10-CM | POA: Diagnosis not present

## 2019-06-22 DIAGNOSIS — M47816 Spondylosis without myelopathy or radiculopathy, lumbar region: Secondary | ICD-10-CM | POA: Diagnosis not present

## 2019-06-22 DIAGNOSIS — E785 Hyperlipidemia, unspecified: Secondary | ICD-10-CM | POA: Diagnosis not present

## 2019-06-22 DIAGNOSIS — M1711 Unilateral primary osteoarthritis, right knee: Secondary | ICD-10-CM | POA: Diagnosis not present

## 2019-06-22 DIAGNOSIS — E1143 Type 2 diabetes mellitus with diabetic autonomic (poly)neuropathy: Secondary | ICD-10-CM | POA: Diagnosis not present

## 2019-06-29 DIAGNOSIS — C44722 Squamous cell carcinoma of skin of right lower limb, including hip: Secondary | ICD-10-CM | POA: Diagnosis not present

## 2019-06-29 DIAGNOSIS — L570 Actinic keratosis: Secondary | ICD-10-CM | POA: Diagnosis not present

## 2019-07-02 DIAGNOSIS — K5904 Chronic idiopathic constipation: Secondary | ICD-10-CM | POA: Diagnosis not present

## 2019-07-05 DIAGNOSIS — E538 Deficiency of other specified B group vitamins: Secondary | ICD-10-CM | POA: Diagnosis not present

## 2019-07-05 DIAGNOSIS — M2550 Pain in unspecified joint: Secondary | ICD-10-CM | POA: Diagnosis not present

## 2019-07-05 DIAGNOSIS — R110 Nausea: Secondary | ICD-10-CM | POA: Diagnosis not present

## 2019-07-05 DIAGNOSIS — R5382 Chronic fatigue, unspecified: Secondary | ICD-10-CM | POA: Diagnosis not present

## 2019-07-06 ENCOUNTER — Other Ambulatory Visit: Payer: Self-pay | Admitting: Cardiology

## 2019-07-08 ENCOUNTER — Other Ambulatory Visit: Payer: Self-pay | Admitting: Family Medicine

## 2019-07-08 DIAGNOSIS — R61 Generalized hyperhidrosis: Secondary | ICD-10-CM

## 2019-07-13 DIAGNOSIS — Z111 Encounter for screening for respiratory tuberculosis: Secondary | ICD-10-CM | POA: Diagnosis not present

## 2019-07-22 DIAGNOSIS — M170 Bilateral primary osteoarthritis of knee: Secondary | ICD-10-CM | POA: Diagnosis not present

## 2019-07-23 ENCOUNTER — Other Ambulatory Visit: Payer: Medicare Other

## 2019-07-23 ENCOUNTER — Ambulatory Visit
Admission: RE | Admit: 2019-07-23 | Discharge: 2019-07-23 | Disposition: A | Discharge status: Home / Self Care | Payer: Medicare Other | Source: Ambulatory Visit | Attending: Family Medicine | Admitting: Family Medicine

## 2019-07-23 ENCOUNTER — Other Ambulatory Visit: Payer: Self-pay

## 2019-07-23 DIAGNOSIS — R61 Generalized hyperhidrosis: Secondary | ICD-10-CM

## 2019-07-23 DIAGNOSIS — R590 Localized enlarged lymph nodes: Secondary | ICD-10-CM | POA: Diagnosis not present

## 2019-07-23 MED ORDER — IOPAMIDOL (ISOVUE-370) INJECTION 76%
75.0000 mL | Freq: Once | INTRAVENOUS | Status: AC | PRN
  Administered 2019-07-23: 75 mL via INTRAVENOUS

## 2019-07-29 ENCOUNTER — Telehealth: Payer: Self-pay | Admitting: Cardiology

## 2019-07-29 NOTE — Telephone Encounter (Signed)
Pt husband informed of providers result & recommendations. Pt verbalized understanding. No further questions . EXPRESSES THANKS

## 2019-07-29 NOTE — Telephone Encounter (Signed)
    Pt's spouse calling, he said Dr. Dema Severin sent Dr. Stanford Breed of pt's MRI and they haven't heard anything about result  Please call

## 2019-07-29 NOTE — Telephone Encounter (Signed)
CT showed left atrial enlargement which is likely secondary to the patient's permanent atrial fibrillation/atriopathy and not surprising. Nothing to be concerned about Krystal Clarke

## 2019-08-03 DIAGNOSIS — R152 Fecal urgency: Secondary | ICD-10-CM | POA: Diagnosis not present

## 2019-08-03 DIAGNOSIS — R159 Full incontinence of feces: Secondary | ICD-10-CM | POA: Diagnosis not present

## 2019-08-03 DIAGNOSIS — K5904 Chronic idiopathic constipation: Secondary | ICD-10-CM | POA: Diagnosis not present

## 2019-08-10 DIAGNOSIS — L905 Scar conditions and fibrosis of skin: Secondary | ICD-10-CM | POA: Diagnosis not present

## 2019-08-10 DIAGNOSIS — Z85828 Personal history of other malignant neoplasm of skin: Secondary | ICD-10-CM | POA: Diagnosis not present

## 2019-08-27 DIAGNOSIS — M3501 Sicca syndrome with keratoconjunctivitis: Secondary | ICD-10-CM | POA: Diagnosis not present

## 2019-08-27 DIAGNOSIS — H18893 Other specified disorders of cornea, bilateral: Secondary | ICD-10-CM | POA: Diagnosis not present

## 2019-08-27 DIAGNOSIS — Z961 Presence of intraocular lens: Secondary | ICD-10-CM | POA: Diagnosis not present

## 2019-08-27 DIAGNOSIS — H02403 Unspecified ptosis of bilateral eyelids: Secondary | ICD-10-CM | POA: Diagnosis not present

## 2019-08-27 DIAGNOSIS — H401133 Primary open-angle glaucoma, bilateral, severe stage: Secondary | ICD-10-CM | POA: Diagnosis not present

## 2019-08-27 DIAGNOSIS — M3500 Sicca syndrome, unspecified: Secondary | ICD-10-CM | POA: Diagnosis not present

## 2019-09-01 DIAGNOSIS — R110 Nausea: Secondary | ICD-10-CM | POA: Diagnosis not present

## 2019-09-01 DIAGNOSIS — K5904 Chronic idiopathic constipation: Secondary | ICD-10-CM | POA: Diagnosis not present

## 2019-09-20 NOTE — Progress Notes (Signed)
Cardiology Clinic Note   Patient Name: Krystal Clarke Date of Encounter: 09/21/2019  Primary Care Provider:  Harlan Stains, MD Primary Cardiologist:  Kirk Ruths, MD  Patient Profile    Krystal Clarke 84 year old female presents today for follow-up of her atrial fibrillation, hypertension, and hyperlipidemia.  Past Medical History    Past Medical History:  Diagnosis Date   Acute respiratory failure with hypoxia ([place]) 12/06/2012   Allergy    Blindness of right eye    Complication of anesthesia    Dehydration with hyponatremia 12/07/2012   Diabetes mellitus without complication (HCC)    diagnosed 2 weeks ago- No CBG meter as of yet.   DIVERTICULITIS, HX OF 01/27/2009   Qualifier: Diagnosis of  By: Jerold Coombe     Gait disorder 06/05/2012   GERD (gastroesophageal reflux disease)    Glaucoma    Glucose intolerance (impaired glucose tolerance)    History of diverticulitis    Hypertension    Legally blind    Migraine triggered seizures (HCC)    history of   Migraines    Neuropathy    OP (osteoporosis)    Osteoarthritis    osteoarthritis -right knee. uses walker. Left shoulder -"Limited ROM"   Paroxysmal atrial fibrillation ([place]) 12/06/2012   Echo (12/07/12): Moderate focal basal hypertrophy of the septum, vigorous LV function, EF 65-70%, indeterminate diastolic function, normal wall motion, trivial MR, moderate LAE, trivial TR;  Xarelto started 11/2012   Peripheral neuropathy    neuropathy   Pneumonia    PONV (postoperative nausea and vomiting)    no problems other past few surgeries   SHINGLES, HX OF 07/17/2009   Qualifier: Diagnosis of  By: Jerold Coombe     Siriasis    Sjogren's syndrome ([place])    bilateral vision   Sleep apnea    does not wear CPAP   Stroke ([place])    residual - rare Intermittent expressive aphasia,   Past Surgical History:  Procedure Laterality Date   BONE TUMOR EXCISION Right    arm   CATARACT  EXTRACTION     COLONOSCOPY WITH PROPOFOL N/A 06/09/2015   Procedure: COLONOSCOPY WITH PROPOFOL;  Surgeon: Ronald Lobo, MD;  Location: [facility] ENDOSCOPY;  Service: Endoscopy;  Laterality: N/A;   DILATION AND CURETTAGE OF UTERUS     ESOPHAGOGASTRODUODENOSCOPY N/A 04/17/2016   Procedure: ESOPHAGOGASTRODUODENOSCOPY (EGD);  Surgeon: Teena Irani, MD;  Location: [facility] ENDOSCOPY;  Service: Endoscopy;  Laterality: N/A;   ESOPHAGOGASTRODUODENOSCOPY (EGD) WITH PROPOFOL N/A 06/09/2015   Procedure: ESOPHAGOGASTRODUODENOSCOPY (EGD) WITH PROPOFOL;  Surgeon: Ronald Lobo, MD;  Location: [facility] ENDOSCOPY;  Service: Endoscopy;  Laterality: N/A;   EYE SURGERY     FOOT FRACTURE SURGERY Left 2007   GIVENS CAPSULE STUDY N/A 04/17/2016   Procedure: GIVENS CAPSULE STUDY;  Surgeon: Teena Irani, MD;  Location: [place];  Service: Endoscopy;  Laterality: N/A;   KNEE ARTHROSCOPY Right    LUMBAR LAMINECTOMY     ROBOT ASSISTED LAPAROSCOPIC PARTIAL COLECTOMY  02/07/2016   Procedure: ROBOT ASSISTED LAPAROSCOPIC PARTIAL COLECTOMY;  Surgeon: Leighton Ruff, MD;  Location: [facility] ORS;  Service: General;;   SHOULDER OPEN ROTATOR CUFF REPAIR     TONSILLECTOMY AND ADENOIDECTOMY     TOTAL ABDOMINAL HYSTERECTOMY W/ BILATERAL SALPINGOOPHORECTOMY      Allergies  Allergies  Allergen Reactions   Morphine    Oxycodone-Aspirin Nausea And Vomiting   Codeine Nausea And Vomiting and Other (See Comments)   Hydrocodone Nausea And Vomiting   Morphine And  Related Nausea And Vomiting   Oxycodone Nausea And Vomiting   Pilocarpine Other (See Comments)    Reaction:  Unknown    Rivaroxaban Other (See Comments)    Reaction:  Back pain/headaches     History of Present Illness    Ms. Lahmann has a PMH of atrial fibrillation, hypertension, hyperlipidemia, peripheral neuropathy, and Sjogren's syndrome.  On 9/14 she was admitted to the hospital with functional decline, generalized weakness, malaise and cough.  She was found to be in  atrial fibrillation with RVR.  CHA2DS2-VASc score 4.  She was placed on Xarelto at that time.  She converted to sinus rhythm.  An MRI 10/14 showed small acute/subacute left thalamic infarct.  She had an episode of recurrent atrial fibrillation 8/16.  Nuclear stress test 8/16 showed an EF of 65% and no ischemia.  She decided against cardioversion prior and her amiodarone was discontinued.  A Holter monitor 2/17 showed atrial fibrillation with PVCs or aberrantly conducted beats with rate control.  Bilateral carotid Doppler showed 1-39% stenosis.  An echocardiogram 3/19 showed normal LV function, mitral regurgitation, moderate left atrial enlargement, mild right ventricular enlargement.  She was last seen by Dr. Stanford Breed 12/20 and doing well at that time.  She denied chest pain, dyspnea, palpitations, and syncope.  The CT 5/21 showed left atrial enlargement which was believed to be secondary to her permanent atrial fibrillation/atriopathy.  This was consistent with previous findings.  She presents to the clinic today for follow-up evaluation and states she feels well.  She does however have ongoing nausea each morning.  She is taking antinausea medications and Pepto-Bismol.  She is working with her PCP and GI to help resolve this.  She states that she is not very physically active due to her neuropathy.  She states that she also notices hot flashes with her nausea morning.  She indicates that she is working with [facility] using special eyedrops to help preserve her vision.  I will give her the salty 6 diet sheet and have her follow-up with Dr. Stanford Breed in 6 months.  She denies chest pain, shortness of breath, lower extremity edema, fatigue, palpitations, melena, hematuria, hemoptysis, diaphoresis, weakness, presyncope, syncope, orthopnea, and PND.   Home Medications    Prior to Admission medications   Medication Sig Start Date End Date Taking? Authorizing Provider  cephALEXin (KEFLEX) 500 MG  capsule cephalexin 500 mg capsule    [provider]  cyclobenzaprine (FLEXERIL) 5 MG tablet TAKE 1 TABLET BY MOUTH AT BEDTIME 11/17/18   Patel, Donika K, DO  diltiazem (CARDIZEM CD) 180 MG 24 hr capsule TAKE 1 CAPSULE (180 MG TOTAL) BY MOUTH DAILY. 03/08/19   Lelon Perla, MD  dorzolamide (TRUSOPT) 2 % ophthalmic solution Place 1 drop into both eyes 2 (two) times daily.    [provider]  dorzolamide-timolol (COSOPT) 22.3-6.8 MG/ML ophthalmic solution  10/29/16   [provider]  ELIQUIS 5 MG TABS tablet TAKE 1 TABLET BY MOUTH TWICE A DAY 07/06/19   Lelon Perla, MD  fenofibrate (TRICOR) 145 MG tablet Take 145 mg by mouth at bedtime.     [provider]  omeprazole-sodium bicarbonate (ZEGERID) 40-1100 MG capsule  09/10/18   [provider]  ondansetron (ZOFRAN) 8 MG tablet TAKE ONE TABLET EVERY 6 HOURS AS NEEDED FOR NAUSEA ORALLY 10 DAYS 07/10/17   [provider]  pantoprazole (PROTONIX) 40 MG tablet Take 40 mg by mouth daily.    [provider]  pregabalin (LYRICA) 100 MG capsule Take 1 capsule (100 mg total) by mouth 2 (two) times daily. 05/21/19   Patel, Arvin Collard K, DO  tizanidine (ZANAFLEX) 2 MG capsule TAKE 1 CAPSULE (2 MG TOTAL) BY MOUTH 3 (THREE) TIMES DAILY. 01/12/19   Patel, Arvin Collard K, DO  traMADol (ULTRAM) 50 MG tablet 1 TABLET 3 TIMES PER DAY AS NEEDED FOR PAIN ORALLY (INSURANCE MAX 7 DAY SUPPLY) 09/01/18   [provider]    Family History    Family History  Problem Relation Age of Onset   Stroke Mother    Migraines Mother    Stomach cancer Father    Sjogren's syndrome Father    Sjogren's syndrome Brother    Sjogren's syndrome Brother    Scleroderma Sister        raynaud's scleroderma   Lymphoma Daughter    Migraines Daughter    Heart disease Other        maternal side   Stroke Other        maternal side, 1st degree female <60   She indicated that her mother is deceased. She indicated  that her father is deceased. She indicated that the status of her sister is unknown. She indicated that her maternal grandmother is deceased. She indicated that her maternal grandfather is deceased. She indicated that her paternal grandmother is deceased. She indicated that her paternal grandfather is deceased. She indicated that one of her three daughters is deceased.  Social History    Social History   Socioeconomic History   Marital status: Married    Spouse name: Kiira Brach   Number of children: 2   Years of education: college   Highest education level: Not on file  Occupational History   Occupation: retired  Tobacco Use   Smoking status: Former Smoker    Types: Cigarettes   Smokeless tobacco: Never Used   Tobacco comment: quit smoking before age of 39  Vaping Use   Vaping Use: Never used  Substance and Sexual Activity   Alcohol use: No    Alcohol/week: 0.0 standard drinks   Drug use: No   Sexual activity: Not on file  Other Topics Concern   Not on file  Social History Narrative   She lives with husband of 76 years.  They have a one story home.  One daughter.    Right handed    Social Determinants of Health   Financial Resource Strain:    Difficulty of Paying Living Expenses:   Food Insecurity:    Worried About Charity fundraiser in the Last Year:    Arboriculturist in the Last Year:   Transportation Needs:    Film/video editor (Medical):    Lack of Transportation (Non-Medical):   Physical Activity:    Days of Exercise per Week:    Minutes of Exercise per Session:   Stress:    Feeling of Stress :   Social Connections:    Frequency of Communication with Friends and Family:    Frequency of Social Gatherings with Friends and Family:    Attends Religious Services:    Active Member of Clubs or Organizations:    Attends Music therapist:    Marital Status:   Intimate Partner Violence:    Fear of Current or Ex-Partner:     Emotionally Abused:    Physically Abused:    Sexually Abused:      Review of Systems    General:  No chills, fever, night  sweats or weight changes.  Cardiovascular:  No chest pain, dyspnea on exertion, edema, orthopnea, palpitations, paroxysmal nocturnal dyspnea. Dermatological: No rash, lesions/masses Respiratory: No cough, dyspnea Urologic: No hematuria, dysuria Abdominal:   No nausea, vomiting, diarrhea, bright red blood per rectum, melena, or hematemesis Neurologic:  No visual changes, wkns, changes in mental status. All other systems reviewed and are otherwise negative except as noted above.  Physical Exam    VS:  BP 130/80 (BP Location: Right Arm, Patient Position: Supine, Cuff Size: Normal)    Pulse 88    Ht 5\' 1"  (1.549 m)    Wt 173 lb (78.5 kg)    SpO2 97%    BMI 32.69 kg/m  , BMI Body mass index is 32.69 kg/m. GEN: Well nourished, well developed, in no acute distress. HEENT: normal. Neck: Supple, no JVD, carotid bruits, or masses. Cardiac: RRR, no murmurs, rubs, or gallops. No clubbing, cyanosis, edema.  Radials/DP/PT 2+ and equal bilaterally.  Respiratory:  Respirations regular and unlabored, clear to auscultation bilaterally. GI: Soft, nontender, nondistended, BS + x 4. MS: no deformity or atrophy. Skin: warm and dry, no rash. Neuro:  Strength and sensation are intact. Psych: Normal affect.  Accessory Clinical Findings    ECG personally reviewed by me today-atrial fibrillation septal infarct undetermined age 17 and T wave abnormality 88 bpm- No acute changes  EKG 02/25/2019 Atrial fibrillation septal infarct undetermined age 76 bpm  Echocardiogram 06/11/2017 Study Conclusions   - Left ventricle: The cavity size was normal. There was severe  focal basal hypertrophy of the septum measuring 1.8 cm. No LVOT  obstruction is noted. Systolic function was normal. The estimated  ejection fraction was in the range of 55% to 60%. Images were  inadequate for LV  wall motion assessment. The study is not  technically sufficient to allow evaluation of LV diastolic  function.  - Aortic valve: Trileaflet. Sclerosis without stenosis. There was  no regurgitation.  - Mitral valve: Mildly thickened leaflets . There was mild  regurgitation.  - Left atrium: Moderately dilated.  - Right ventricle: The cavity size was mildly dilated. Mildly  reduced systolic function.  - Tricuspid valve: There was mild regurgitation.  - Pulmonary arteries: PA peak pressure: 24 mm Hg (S).  - Inferior vena cava: The vessel was normal in size. The  respirophasic diameter changes were in the normal range (>= 50%),  consistent with normal central venous pressure.    Assessment & Plan   1.  Permanent atrial fibrillation-EKG today shows atrial fibrillation septal infarct undetermined age ST and T wave abnormality 88 bpm.  Cardioversion previously declined. Continue Cardizem, apixaban Heart healthy low-sodium diet-salty 6 given Increase physical activity as tolerated Avoid triggers caffeine, chocolate, EtOH etc.  Essential hypertension-BP today 130/80.  Well-controlled at home Continue Cardizem, Heart healthy low-sodium diet-salty 6 given Increase physical activity as tolerated  Chronic diastolic CHF-euvolemic today.  Weight has been stable. Continue continue diltiazem Heart healthy low-sodium diet-salty 6 given-discussed Increase physical activity as tolerated Continue fluid restriction Continue daily weights  Hyperlipidemia-LDL 56 on 07/17/2009 Heart healthy low-sodium high-fiber diet Increase physical activity as tolerated Monitored by PCP   Disposition: Follow-up with Dr. Stanford Breed in 6 months.  Jossie Ng. Jameelah Watts NP-C    09/21/2019, 8:42 AM [address] Office [phone] Fax [phone]

## 2019-09-21 ENCOUNTER — Encounter: Payer: Self-pay | Admitting: General Practice

## 2019-09-21 ENCOUNTER — Ambulatory Visit (INDEPENDENT_AMBULATORY_CARE_PROVIDER_SITE_OTHER): Payer: Medicare Other | Admitting: General Practice

## 2019-09-21 ENCOUNTER — Other Ambulatory Visit: Payer: Self-pay

## 2019-09-21 VITALS — BP 130/80 | HR 88 | Ht 61.0 in | Wt 173.0 lb

## 2019-09-21 DIAGNOSIS — I10 Essential (primary) hypertension: Secondary | ICD-10-CM | POA: Diagnosis not present

## 2019-09-21 DIAGNOSIS — I4821 Permanent atrial fibrillation: Secondary | ICD-10-CM

## 2019-09-21 DIAGNOSIS — I5032 Chronic diastolic (congestive) heart failure: Secondary | ICD-10-CM

## 2019-09-21 DIAGNOSIS — E782 Mixed hyperlipidemia: Secondary | ICD-10-CM | POA: Diagnosis not present

## 2019-09-21 NOTE — Patient Instructions (Signed)
Medication Instructions:  The current medical regimen is effective;  continue present plan and medications as directed. Please refer to the Current Medication list given to you today. *If you need a refill on your cardiac medications before your next appointment, please call your pharmacy*  Special Instructions PLEASE READ AND FOLLOW SALTY 6-ATTACHED  Follow-Up: Your next appointment:  6 month(s) Please call our office 2 months in advance to schedule this appointment In Person with Kirk Ruths, MD  At [facility], you and your health needs are our priority.  As part of our continuing mission to provide you with exceptional heart care, we have created designated Provider Care Teams.  These Care Teams include your primary Cardiologist (physician) and Advanced Practice Providers (APPs -  Physician Assistants and Nurse Practitioners) who all work together to provide you with the care you need, when you need it.  We recommend signing up for the patient portal called "MyChart".  Sign up information is provided on this After Visit Summary.  MyChart is used to connect with patients for Virtual Visits (Telemedicine).  Patients are able to view lab/test results, encounter notes, upcoming appointments, etc.  Non-urgent messages can be sent to your provider as well.   To learn more about what you can do with MyChart, go to [email or web address].

## 2019-09-27 DIAGNOSIS — M7989 Other specified soft tissue disorders: Secondary | ICD-10-CM | POA: Diagnosis not present

## 2019-10-05 DIAGNOSIS — M170 Bilateral primary osteoarthritis of knee: Secondary | ICD-10-CM | POA: Diagnosis not present

## 2019-10-11 DIAGNOSIS — H401133 Primary open-angle glaucoma, bilateral, severe stage: Secondary | ICD-10-CM | POA: Diagnosis not present

## 2019-10-14 DIAGNOSIS — M170 Bilateral primary osteoarthritis of knee: Secondary | ICD-10-CM | POA: Diagnosis not present

## 2019-10-22 DIAGNOSIS — E785 Hyperlipidemia, unspecified: Secondary | ICD-10-CM | POA: Diagnosis not present

## 2019-10-22 DIAGNOSIS — D582 Other hemoglobinopathies: Secondary | ICD-10-CM | POA: Diagnosis not present

## 2019-10-22 DIAGNOSIS — M47816 Spondylosis without myelopathy or radiculopathy, lumbar region: Secondary | ICD-10-CM | POA: Diagnosis not present

## 2019-10-22 DIAGNOSIS — M1711 Unilateral primary osteoarthritis, right knee: Secondary | ICD-10-CM | POA: Diagnosis not present

## 2019-10-22 DIAGNOSIS — E1143 Type 2 diabetes mellitus with diabetic autonomic (poly)neuropathy: Secondary | ICD-10-CM | POA: Diagnosis not present

## 2019-10-22 DIAGNOSIS — G4700 Insomnia, unspecified: Secondary | ICD-10-CM | POA: Diagnosis not present

## 2019-10-22 DIAGNOSIS — I480 Paroxysmal atrial fibrillation: Secondary | ICD-10-CM | POA: Diagnosis not present

## 2019-11-10 ENCOUNTER — Other Ambulatory Visit: Payer: Self-pay | Admitting: Neurology

## 2019-11-17 DIAGNOSIS — M3500 Sicca syndrome, unspecified: Secondary | ICD-10-CM | POA: Diagnosis not present

## 2019-11-17 DIAGNOSIS — I480 Paroxysmal atrial fibrillation: Secondary | ICD-10-CM | POA: Diagnosis not present

## 2019-11-17 DIAGNOSIS — E538 Deficiency of other specified B group vitamins: Secondary | ICD-10-CM | POA: Diagnosis not present

## 2019-11-17 DIAGNOSIS — D6869 Other thrombophilia: Secondary | ICD-10-CM | POA: Diagnosis not present

## 2019-11-17 DIAGNOSIS — G629 Polyneuropathy, unspecified: Secondary | ICD-10-CM | POA: Diagnosis not present

## 2019-11-17 DIAGNOSIS — R110 Nausea: Secondary | ICD-10-CM | POA: Diagnosis not present

## 2019-11-17 DIAGNOSIS — E559 Vitamin D deficiency, unspecified: Secondary | ICD-10-CM | POA: Diagnosis not present

## 2019-11-17 DIAGNOSIS — R61 Generalized hyperhidrosis: Secondary | ICD-10-CM | POA: Diagnosis not present

## 2019-11-17 DIAGNOSIS — R030 Elevated blood-pressure reading, without diagnosis of hypertension: Secondary | ICD-10-CM | POA: Diagnosis not present

## 2019-11-17 DIAGNOSIS — E1143 Type 2 diabetes mellitus with diabetic autonomic (poly)neuropathy: Secondary | ICD-10-CM | POA: Diagnosis not present

## 2019-11-24 ENCOUNTER — Telehealth: Payer: Self-pay | Admitting: Neurology

## 2019-11-24 NOTE — Telephone Encounter (Signed)
Patient called back, she is very concerned because she hasn't had her medication in 4 days and she is completely out.

## 2019-11-24 NOTE — Telephone Encounter (Signed)
Called patient and informed her that Dr. Posey Pronto is out of office and a message has been sent in regards to the refill. Informed patient that filling Lyrica is out of my control and has to be approved by Dr. Posey Pronto. Informed patient that if its urgent she can reach out to her PCP. Patient is aware that I will call her back once Dr. Posey Pronto responds to message. Patient verbalized understanding.

## 2019-11-24 NOTE — Telephone Encounter (Signed)
Patient called in needing a refill of her Lyrica sent to the [facility] on [address].

## 2019-11-25 MED ORDER — PREGABALIN 100 MG PO CAPS: 100.0000 mg | ORAL_CAPSULE | Freq: Two times a day (BID) | ORAL | 5 refills | Status: DC

## 2019-11-25 NOTE — Telephone Encounter (Signed)
Called patient and informed her that Lyrica has been sent to the pharmacy. Patient was also informed that Dr. Posey Pronto would like to have her f/u in 2-3 months VV visit or in office. Patient was transferred to the front to make f/u appt.

## 2019-11-25 NOTE — Telephone Encounter (Signed)
Please let Ms. Krystal Clarke know that I have refilled her Lyrica and offer a follow-up visit in the next 2-3 months -OK for VV/telephone or in-office.  Thanks.

## 2019-11-26 ENCOUNTER — Other Ambulatory Visit: Payer: Self-pay | Admitting: Cardiology

## 2019-11-26 DIAGNOSIS — I4891 Unspecified atrial fibrillation: Secondary | ICD-10-CM

## 2019-11-30 DIAGNOSIS — E785 Hyperlipidemia, unspecified: Secondary | ICD-10-CM | POA: Diagnosis not present

## 2019-11-30 DIAGNOSIS — E1143 Type 2 diabetes mellitus with diabetic autonomic (poly)neuropathy: Secondary | ICD-10-CM | POA: Diagnosis not present

## 2019-11-30 DIAGNOSIS — I480 Paroxysmal atrial fibrillation: Secondary | ICD-10-CM | POA: Diagnosis not present

## 2019-11-30 DIAGNOSIS — M1711 Unilateral primary osteoarthritis, right knee: Secondary | ICD-10-CM | POA: Diagnosis not present

## 2019-11-30 DIAGNOSIS — G4700 Insomnia, unspecified: Secondary | ICD-10-CM | POA: Diagnosis not present

## 2019-11-30 DIAGNOSIS — D582 Other hemoglobinopathies: Secondary | ICD-10-CM | POA: Diagnosis not present

## 2019-11-30 DIAGNOSIS — M47816 Spondylosis without myelopathy or radiculopathy, lumbar region: Secondary | ICD-10-CM | POA: Diagnosis not present

## 2019-12-22 DIAGNOSIS — E1143 Type 2 diabetes mellitus with diabetic autonomic (poly)neuropathy: Secondary | ICD-10-CM | POA: Diagnosis not present

## 2019-12-22 DIAGNOSIS — E785 Hyperlipidemia, unspecified: Secondary | ICD-10-CM | POA: Diagnosis not present

## 2019-12-22 DIAGNOSIS — D582 Other hemoglobinopathies: Secondary | ICD-10-CM | POA: Diagnosis not present

## 2019-12-22 DIAGNOSIS — G4700 Insomnia, unspecified: Secondary | ICD-10-CM | POA: Diagnosis not present

## 2019-12-22 DIAGNOSIS — M1711 Unilateral primary osteoarthritis, right knee: Secondary | ICD-10-CM | POA: Diagnosis not present

## 2019-12-22 DIAGNOSIS — M47816 Spondylosis without myelopathy or radiculopathy, lumbar region: Secondary | ICD-10-CM | POA: Diagnosis not present

## 2019-12-22 DIAGNOSIS — I480 Paroxysmal atrial fibrillation: Secondary | ICD-10-CM | POA: Diagnosis not present

## 2019-12-28 DIAGNOSIS — Z23 Encounter for immunization: Secondary | ICD-10-CM | POA: Diagnosis not present

## 2019-12-30 ENCOUNTER — Other Ambulatory Visit: Payer: Self-pay | Admitting: Cardiology

## 2020-01-05 ENCOUNTER — Other Ambulatory Visit: Payer: Self-pay | Admitting: Physician Assistant

## 2020-01-05 DIAGNOSIS — K639 Disease of intestine, unspecified: Secondary | ICD-10-CM | POA: Diagnosis not present

## 2020-01-05 DIAGNOSIS — R110 Nausea: Secondary | ICD-10-CM

## 2020-01-05 DIAGNOSIS — R1013 Epigastric pain: Secondary | ICD-10-CM

## 2020-01-05 DIAGNOSIS — K219 Gastro-esophageal reflux disease without esophagitis: Secondary | ICD-10-CM | POA: Diagnosis not present

## 2020-01-12 ENCOUNTER — Other Ambulatory Visit: Payer: Self-pay | Admitting: Physician Assistant

## 2020-01-12 ENCOUNTER — Ambulatory Visit
Admission: RE | Admit: 2020-01-12 | Discharge: 2020-01-12 | Disposition: A | Discharge status: Home / Self Care | Payer: Medicare Other | Source: Ambulatory Visit | Attending: Physician Assistant | Admitting: Physician Assistant

## 2020-01-12 DIAGNOSIS — K224 Dyskinesia of esophagus: Secondary | ICD-10-CM | POA: Diagnosis not present

## 2020-01-12 DIAGNOSIS — K219 Gastro-esophageal reflux disease without esophagitis: Secondary | ICD-10-CM

## 2020-01-12 DIAGNOSIS — R1013 Epigastric pain: Secondary | ICD-10-CM

## 2020-01-12 DIAGNOSIS — R110 Nausea: Secondary | ICD-10-CM

## 2020-01-12 DIAGNOSIS — K449 Diaphragmatic hernia without obstruction or gangrene: Secondary | ICD-10-CM | POA: Diagnosis not present

## 2020-01-19 DIAGNOSIS — R198 Other specified symptoms and signs involving the digestive system and abdomen: Secondary | ICD-10-CM | POA: Diagnosis not present

## 2020-01-19 DIAGNOSIS — R110 Nausea: Secondary | ICD-10-CM | POA: Diagnosis not present

## 2020-01-22 DIAGNOSIS — Z23 Encounter for immunization: Secondary | ICD-10-CM | POA: Diagnosis not present

## 2020-02-01 ENCOUNTER — Ambulatory Visit: Payer: Medicare Other | Admitting: Neurology

## 2020-02-03 DIAGNOSIS — E1143 Type 2 diabetes mellitus with diabetic autonomic (poly)neuropathy: Secondary | ICD-10-CM | POA: Diagnosis not present

## 2020-02-03 DIAGNOSIS — M1711 Unilateral primary osteoarthritis, right knee: Secondary | ICD-10-CM | POA: Diagnosis not present

## 2020-02-03 DIAGNOSIS — M47816 Spondylosis without myelopathy or radiculopathy, lumbar region: Secondary | ICD-10-CM | POA: Diagnosis not present

## 2020-02-03 DIAGNOSIS — E785 Hyperlipidemia, unspecified: Secondary | ICD-10-CM | POA: Diagnosis not present

## 2020-02-03 DIAGNOSIS — I480 Paroxysmal atrial fibrillation: Secondary | ICD-10-CM | POA: Diagnosis not present

## 2020-02-03 DIAGNOSIS — D582 Other hemoglobinopathies: Secondary | ICD-10-CM | POA: Diagnosis not present

## 2020-02-03 DIAGNOSIS — G4700 Insomnia, unspecified: Secondary | ICD-10-CM | POA: Diagnosis not present

## 2020-02-03 DIAGNOSIS — K219 Gastro-esophageal reflux disease without esophagitis: Secondary | ICD-10-CM | POA: Diagnosis not present

## 2020-02-21 ENCOUNTER — Ambulatory Visit: Payer: Medicare Other | Admitting: Neurology

## 2020-02-23 DIAGNOSIS — R198 Other specified symptoms and signs involving the digestive system and abdomen: Secondary | ICD-10-CM | POA: Diagnosis not present

## 2020-02-23 DIAGNOSIS — R110 Nausea: Secondary | ICD-10-CM | POA: Diagnosis not present

## 2020-03-06 ENCOUNTER — Emergency Department (HOSPITAL_COMMUNITY)
Admission: EM | Admit: 2020-03-06 | Discharge: 2020-03-06 | Disposition: A | Discharge status: Home / Self Care | Payer: Medicare Other | Attending: Emergency Medicine | Admitting: Emergency Medicine

## 2020-03-06 ENCOUNTER — Other Ambulatory Visit: Payer: Self-pay

## 2020-03-06 DIAGNOSIS — R110 Nausea: Secondary | ICD-10-CM | POA: Diagnosis not present

## 2020-03-06 DIAGNOSIS — M545 Low back pain, unspecified: Secondary | ICD-10-CM | POA: Diagnosis not present

## 2020-03-06 DIAGNOSIS — Z5321 Procedure and treatment not carried out due to patient leaving prior to being seen by health care provider: Secondary | ICD-10-CM | POA: Diagnosis not present

## 2020-03-06 LAB — CBC
HCT: 49.6 % — ABNORMAL HIGH (ref 36.0–46.0)
Hemoglobin: 16 g/dL — ABNORMAL HIGH (ref 12.0–15.0)
MCH: 30.8 pg (ref 26.0–34.0)
MCHC: 32.3 g/dL (ref 30.0–36.0)
MCV: 95.4 fL (ref 80.0–100.0)
Platelets: 191 10*3/uL (ref 150–400)
RBC: 5.2 MIL/uL — ABNORMAL HIGH (ref 3.87–5.11)
RDW: 13.6 % (ref 11.5–15.5)
WBC: 8.2 10*3/uL (ref 4.0–10.5)
nRBC: 0 % (ref 0.0–0.2)

## 2020-03-06 LAB — URINALYSIS, ROUTINE W REFLEX MICROSCOPIC
Bilirubin Urine: NEGATIVE
Glucose, UA: NEGATIVE mg/dL
Hgb urine dipstick: NEGATIVE
Ketones, ur: NEGATIVE mg/dL
Leukocytes,Ua: NEGATIVE
Nitrite: NEGATIVE
Protein, ur: NEGATIVE mg/dL
Specific Gravity, Urine: 1.028 (ref 1.005–1.030)
pH: 5 (ref 5.0–8.0)

## 2020-03-06 LAB — COMPREHENSIVE METABOLIC PANEL
ALT: 15 U/L (ref 0–44)
AST: 23 U/L (ref 15–41)
Albumin: 3.9 g/dL (ref 3.5–5.0)
Alkaline Phosphatase: 51 U/L (ref 38–126)
Anion gap: 9 (ref 5–15)
BUN: 11 mg/dL (ref 8–23)
CO2: 27 mmol/L (ref 22–32)
Calcium: 10.4 mg/dL — ABNORMAL HIGH (ref 8.9–10.3)
Chloride: 99 mmol/L (ref 98–111)
Creatinine, Ser: 0.86 mg/dL (ref 0.44–1.00)
GFR, Estimated: >60 mL/min (ref >60)
Glucose, Bld: 140 mg/dL — ABNORMAL HIGH (ref 70–99)
Potassium: 3.5 mmol/L (ref 3.5–5.1)
Sodium: 135 mmol/L (ref 135–145)
Total Bilirubin: 0.7 mg/dL (ref 0.3–1.2)
Total Protein: 6.5 g/dL (ref 6.5–8.1)

## 2020-03-06 LAB — LIPASE, BLOOD: Lipase: 50 U/L (ref 11–51)

## 2020-03-06 NOTE — ED Notes (Addendum)
Pt daughter stated her mom couldn't sit any longer and they were going home and would call EMS.

## 2020-03-06 NOTE — ED Notes (Signed)
Informed Charge nurse Janett Billow of B/P

## 2020-03-06 NOTE — ED Triage Notes (Signed)
C/o nausea during the day for 8 days; c/o low back pain as well

## 2020-03-13 ENCOUNTER — Ambulatory Visit (INDEPENDENT_AMBULATORY_CARE_PROVIDER_SITE_OTHER): Payer: Medicare Other | Admitting: Neurology

## 2020-03-13 ENCOUNTER — Encounter: Payer: Self-pay | Admitting: Neurology

## 2020-03-13 ENCOUNTER — Other Ambulatory Visit: Payer: Self-pay

## 2020-03-13 VITALS — BP 108/72 | HR 102 | Resp 20 | Ht 61.0 in | Wt 166.0 lb

## 2020-03-13 DIAGNOSIS — R5381 Other malaise: Secondary | ICD-10-CM | POA: Diagnosis not present

## 2020-03-13 DIAGNOSIS — G44221 Chronic tension-type headache, intractable: Secondary | ICD-10-CM | POA: Diagnosis not present

## 2020-03-13 DIAGNOSIS — M3506 Sjogren syndrome with peripheral nervous system involvement: Secondary | ICD-10-CM | POA: Diagnosis not present

## 2020-03-13 DIAGNOSIS — R269 Unspecified abnormalities of gait and mobility: Secondary | ICD-10-CM

## 2020-03-13 DIAGNOSIS — G63 Polyneuropathy in diseases classified elsewhere: Secondary | ICD-10-CM

## 2020-03-13 NOTE — Progress Notes (Signed)
Follow-up Visit   Date: 03/13/20    Krystal Clarke MRN: [id number] DOB: 29-Aug-1935   [name] is a 84 y.o. right-handed Caucasian female with hypertension, Sjogren's syndrome (diagnosed 0000000) complicated by neuropathy and vision impairment, migraines, atrial fibrillation (on apixiban), history of shingles affecting left T6 (07/2009) and stroke (September 2014, residual word-finding difficulty) returning to the clinic for follow-up visit for neuropathy and headaches.    History of present illness: She was under the care of Dr. Erling Cruz at [facility] for a over 30 years for migraines, stroke, peripheral neuropathy, headaches, and gait instability. She has a long history of migraines or started at the age of 50. Her headaches were well controlled on Depakote 250 twice a day. However, it was stopped due to low platelets and anticoagulation. Previously tried medication for headaches is depakote, Botox (2 trials without benefit) and maxalt. Being on apixiban, she is unable to take NSAIDs due to bleeding risk.  She was given a prednisone taper dose with dramatic improvement of headaches and has been relatively well since then.   She was admitted to the hospital in September 2014 for community-acquired pneumonia and found to have new onset atrial fibrillation. Due to worsening headaches, MRI of the brain was obtained in October which showed a small left thalamic subacute stroke.  In 2015, she was started on Lyrica 150-300mg  for headaches and neuropathy and responded well.    In 2017, she was having sneezing spells with shaking of the arms.  This was evaluated with EEG which was normal, this became worse again in 2018, again EEG was negative. She was hospitalized in November 2018 with colovesical fistula s/p robotic colectomy & bladder closure 02/07/2016.  UPDATE 03/13/2020:  For the past 6 months, she has been having extreme nausea and night sweats. She has been seeing GI for  evaluation but frustrated at the lack of answers.  Her neuropathy is well-controlled on Lyrica 100mg  BID.  She has not suffered any falls and is very cautious when walking. She continues to have a very low grade dull headache, which is manageable and does not warrant medication.  No further sneezing spells or migraines.  She is able to perform ADLs, but requires assistance with IADLs.  Husband is primary caregiver, but he is also having medical problems himself and seeing me today.  Medications:  Current Outpatient Medications on File Prior to Visit  Medication Sig Dispense Refill  . diltiazem (CARDIZEM CD) 180 MG 24 hr capsule TAKE 1 CAPSULE (180 MG TOTAL) BY MOUTH DAILY. 90 capsule 3  . dorzolamide (TRUSOPT) 2 % ophthalmic solution Place 1 drop into both eyes 2 (two) times daily.    . dorzolamide-timolol (COSOPT) 22.3-6.8 MG/ML ophthalmic solution     . ELIQUIS 5 MG TABS tablet TAKE 1 TABLET BY MOUTH TWICE A DAY 180 tablet 1  . fenofibrate (TRICOR) 145 MG tablet Take 145 mg by mouth at bedtime.   0  . omeprazole-sodium bicarbonate (ZEGERID) 40-1100 MG capsule     . ondansetron (ZOFRAN) 8 MG tablet TAKE ONE TABLET EVERY 6 HOURS AS NEEDED FOR NAUSEA ORALLY 10 DAYS  0  . pregabalin (LYRICA) 100 MG capsule Take 1 capsule (100 mg total) by mouth 2 (two) times daily. 60 capsule 5  . tizanidine (ZANAFLEX) 2 MG capsule TAKE 1 CAPSULE (2 MG TOTAL) BY MOUTH 3 (THREE) TIMES DAILY. 270 capsule 3  . cyclobenzaprine (FLEXERIL) 5 MG tablet TAKE 1 TABLET BY MOUTH AT BEDTIME (Patient  not taking: Reported on 03/13/2020) 90 tablet 3  . pantoprazole (PROTONIX) 40 MG tablet Take 40 mg by mouth daily. (Patient not taking: Reported on 03/13/2020)    . traMADol (ULTRAM) 50 MG tablet 1 TABLET 3 TIMES PER DAY AS NEEDED FOR PAIN ORALLY (INSURANCE MAX 7 DAY SUPPLY) (Patient not taking: Reported on 03/13/2020)     No current facility-administered medications on file prior to visit.    Allergies:  Allergies  Allergen  Reactions  . Morphine   . Oxycodone-Aspirin Nausea And Vomiting  . Codeine Nausea And Vomiting and Other (See Comments)  . Hydrocodone Nausea And Vomiting  . Morphine And Related Nausea And Vomiting  . Oxycodone Nausea And Vomiting  . Pilocarpine Other (See Comments)    Reaction:  Unknown   . Rivaroxaban Other (See Comments)    Reaction:  Back pain/headaches      Vital Signs:  BP 108/72   Pulse (!) 102   Resp 20   Ht 5\' 1"  (1.549 m)   Wt 166 lb (75.3 kg)   SpO2 99%   BMI 31.37 kg/m   Neurological Exam: MENTAL STATUS including orientation to time, place, person, recent and remote memory, attention span and concentration, language, and fund of knowledge is fair.   Speech is not dysarthric.    CRANIAL NERVES:  Severe visual impairment bilaterally, blind in the right eye, gross movements in the left.  Mild bilateral ptosis. Face is symmetric.   MOTOR: Motor strength is 5-/5 proximally.  Left dorsiflexion and toe extension is 4/5.     SENSORY:  Vibration is intact at the knees, absent distal to ankles.  COORDINATION/GAIT: Gait is moderately wide-based, slow, assisted with rollator  Data: MRI brain wo contrast 12/23/2012: Very small acute/ subacute left thalamic nonhemorrhagic infarct. Prominent small vessel disease type changes. No intracranial hemorrhage. Mild atrophy without hydrocephalus.  Prior work-up at [facility]: EEG 06/03/1995, 08/31/2008, 05/25/2015: normal  CSF 1991: normal  Ambulatory EEG 03/24/2017:  Normal  MRI brain 11/02/2017:  Moderate atrophy, with progression since 2014. Mild chronic microvascular ischemic changes similar to the prior study.  No acute abnormality.  Lab Results  Component Value Date   VITAMINB12 260 04/12/2016   Lab Results  Component Value Date   TSH 0.834 12/06/2012    IMPRESSION/PLAN:   1.  Peripheral neuropathy due to Sjogren's disease, stable.   - Continue Lyrica 100mg  BID which controls painful paresthesias  2.  Tension headaches,  continue tizanidine 2mg  as needed  3.  Migraine without aura (resolved).  Previously used:  VPA (stopped due to low PLT), triptans (stroke), flexeril (no improvement), botox, medrol dose pack (effective)  4.  Spells of sneezing with associated abnormal hand movements of the right arm, resolved.  Ambulatory EEG is normal.    5.  History of left thalamic stroke, likely cardioembolic due to afib on apixiban with mild residual expressive aphasia  6.  Mild vascular dementia.  Stable.  7.  Chronic debility from medical comorbidities (neuropathy, visual impairment, arthritis)   Return to clinic in 6 months   Thank you for allowing me to participate in patient's care.  If I can answer any additional questions, I would be pleased to do so.    Sincerely,    Peace Jost K. 12/08/2012, DO

## 2020-03-13 NOTE — Patient Instructions (Signed)
Return to clinic in 6 months.

## 2020-03-21 NOTE — Telephone Encounter (Signed)
Please let them know to reach out to her PCP's office for guidance on this, since we are a specialty practice and do not order covid testing.

## 2020-03-24 ENCOUNTER — Ambulatory Visit: Payer: Medicare Other | Admitting: Neurology

## 2020-03-28 NOTE — Progress Notes (Deleted)
HPI: FU atrial fibrillation. She has a hx of Sjogren's syndrome, seizure disorder, HTN, HL, peripheral neuropathy. She was admitted 9/14 after presenting with overall functional decline, cough and generalized weakness and malaise. She was found to be in atrial fibrillation with RVR. CHADS2-VASc=4. She was placed on Xarelto for anticoagulation. She converted to sinus rhythm. MRI in October of 2014 showed a very small acute/subacute left thalamic infarct. Patient found to be in recurrent atrial fibrillation 8/16. Nuclear study 8/16 showed EF 65 and no ischemia. Patient declined cardioversion previously and her amiodarone was discontinued. Holter monitor February 2017 showed atrial fibrillation with PVCs or aberrantly conducted beats rate controlled. Showed 1-39% bilateral stenosis. Echocardiogram March 2019 showednormal LV function, mild mitral regurgitation, moderate left atrial enlargement, mild right ventricular enlargement. Since last seen,  Current Outpatient Medications  Medication Sig Dispense Refill  . cyclobenzaprine (FLEXERIL) 5 MG tablet TAKE 1 TABLET BY MOUTH AT BEDTIME (Patient not taking: Reported on 03/13/2020) 90 tablet 3  . diltiazem (CARDIZEM CD) 180 MG 24 hr capsule TAKE 1 CAPSULE (180 MG TOTAL) BY MOUTH DAILY. 90 capsule 3  . dorzolamide (TRUSOPT) 2 % ophthalmic solution Place 1 drop into both eyes 2 (two) times daily.    . dorzolamide-timolol (COSOPT) 22.3-6.8 MG/ML ophthalmic solution     . ELIQUIS 5 MG TABS tablet TAKE 1 TABLET BY MOUTH TWICE A DAY 180 tablet 1  . fenofibrate (TRICOR) 145 MG tablet Take 145 mg by mouth at bedtime.   0  . omeprazole-sodium bicarbonate (ZEGERID) 40-1100 MG capsule     . ondansetron (ZOFRAN) 8 MG tablet TAKE ONE TABLET EVERY 6 HOURS AS NEEDED FOR NAUSEA ORALLY 10 DAYS  0  . pantoprazole (PROTONIX) 40 MG tablet Take 40 mg by mouth daily. (Patient not taking: Reported on 03/13/2020)    . pregabalin (LYRICA) 100 MG capsule Take 1 capsule (100  mg total) by mouth 2 (two) times daily. 60 capsule 5  . tizanidine (ZANAFLEX) 2 MG capsule TAKE 1 CAPSULE (2 MG TOTAL) BY MOUTH 3 (THREE) TIMES DAILY. 270 capsule 3  . traMADol (ULTRAM) 50 MG tablet 1 TABLET 3 TIMES PER DAY AS NEEDED FOR PAIN ORALLY (INSURANCE MAX 7 DAY SUPPLY) (Patient not taking: Reported on 03/13/2020)     No current facility-administered medications for this visit.     Past Medical History:  Diagnosis Date  . Acute respiratory failure with hypoxia ([place]) 12/06/2012  . Allergy   . Blindness of right eye   . Complication of anesthesia   . Dehydration with hyponatremia 12/07/2012  . Diabetes mellitus without complication ([place])    diagnosed 2 weeks ago- No CBG meter as of yet.  Marland Kitchen DIVERTICULITIS, HX OF 01/27/2009   Qualifier: Diagnosis of  By: Jerold Coombe    . Gait disorder 06/05/2012  . GERD (gastroesophageal reflux disease)   . Glaucoma   . Glucose intolerance (impaired glucose tolerance)   . History of diverticulitis   . Hypertension   . Legally blind   . Migraine triggered seizures ([place])    history of  . Migraines   . Neuropathy   . OP (osteoporosis)   . Osteoarthritis    osteoarthritis -right knee. uses walker. Left shoulder -"Limited ROM"  . Paroxysmal atrial fibrillation ([place]) 12/06/2012   Echo (12/07/12): Moderate focal basal hypertrophy of the septum, vigorous LV function, EF 65-70%, indeterminate diastolic function, normal wall motion, trivial MR, moderate LAE, trivial TR;  Xarelto started 11/2012  . Peripheral neuropathy  neuropathy  . Pneumonia   . PONV (postoperative nausea and vomiting)    no problems other past few surgeries  . SHINGLES, HX OF 07/17/2009   Qualifier: Diagnosis of  By: Jerold Coombe    . Siriasis   . Sjogren's syndrome ([place])    bilateral vision  . Sleep apnea    does not wear CPAP  . Stroke [facility])    residual - rare Intermittent expressive aphasia,    Past Surgical History:  Procedure Laterality Date  . BONE TUMOR  EXCISION Right    arm  . CATARACT EXTRACTION    . COLONOSCOPY WITH PROPOFOL N/A 06/09/2015   Procedure: COLONOSCOPY WITH PROPOFOL;  Surgeon: Ronald Lobo, MD;  Location: [place];  Service: Endoscopy;  Laterality: N/A;  . DILATION AND CURETTAGE OF UTERUS    . ESOPHAGOGASTRODUODENOSCOPY N/A 04/17/2016   Procedure: ESOPHAGOGASTRODUODENOSCOPY (EGD);  Surgeon: Teena Irani, MD;  Location: [facility] ENDOSCOPY;  Service: Endoscopy;  Laterality: N/A;  . ESOPHAGOGASTRODUODENOSCOPY (EGD) WITH PROPOFOL N/A 06/09/2015   Procedure: ESOPHAGOGASTRODUODENOSCOPY (EGD) WITH PROPOFOL;  Surgeon: Ronald Lobo, MD;  Location: [facility] ENDOSCOPY;  Service: Endoscopy;  Laterality: N/A;  . EYE SURGERY    . FOOT FRACTURE SURGERY Left 2007  . GIVENS CAPSULE STUDY N/A 04/17/2016   Procedure: GIVENS CAPSULE STUDY;  Surgeon: Teena Irani, MD;  Location: [place];  Service: Endoscopy;  Laterality: N/A;  . KNEE ARTHROSCOPY Right   . LUMBAR LAMINECTOMY    . ROBOT ASSISTED LAPAROSCOPIC PARTIAL COLECTOMY  02/07/2016   Procedure: ROBOT ASSISTED LAPAROSCOPIC PARTIAL COLECTOMY;  Surgeon: Leighton Ruff, MD;  Location: [facility];  Service: General;;  . SHOULDER OPEN ROTATOR CUFF REPAIR    . TONSILLECTOMY AND ADENOIDECTOMY    . TOTAL ABDOMINAL HYSTERECTOMY W/ BILATERAL SALPINGOOPHORECTOMY      Social History   Socioeconomic History  . Marital status: Married    Spouse name: jesse  . Number of children: 2  . Years of education: college  . Highest education level: Not on file  Occupational History  . Occupation: retired  Tobacco Use  . Smoking status: Former Smoker    Types: Cigarettes  . Smokeless tobacco: Never Used  . Tobacco comment: quit smoking before age of 61  Vaping Use  . Vaping Use: Never used  Substance and Sexual Activity  . Alcohol use: No    Alcohol/week: 0.0 standard drinks  . Drug use: No  . Sexual activity: Not on file  Other Topics Concern  . Not on file  Social History Narrative   She lives with  husband of 38 years.  They have a one story home.  One daughter.    Right handed    Social Determinants of Health   Financial Resource Strain: Not on file  Food Insecurity: Not on file  Transportation Needs: Not on file  Physical Activity: Not on file  Stress: Not on file  Social Connections: Not on file  Intimate Partner Violence: Not on file    Family History  Problem Relation Age of Onset  . Stroke Mother   . Migraines Mother   . Stomach cancer Father   . Sjogren's syndrome Father   . Sjogren's syndrome Brother   . Sjogren's syndrome Brother   . Scleroderma Sister        raynaud's scleroderma  . Lymphoma Daughter   . Migraines Daughter   . Heart disease Other        maternal side  . Stroke Other  maternal side, 1st degree female <60    ROS: no fevers or chills, productive cough, hemoptysis, dysphasia, odynophagia, melena, hematochezia, dysuria, hematuria, rash, seizure activity, orthopnea, PND, pedal edema, claudication. Remaining systems are negative.  Physical Exam: Well-developed well-nourished in no acute distress.  Skin is warm and dry.  HEENT is normal.  Neck is supple.  Chest is clear to auscultation with normal expansion.  Cardiovascular exam is regular rate and rhythm.  Abdominal exam nontender or distended. No masses palpated. Extremities show no edema. neuro grossly intact  ECG- personally reviewed  A/P  1 permanent atrial fibrillation-previously declined cardioversion.  Continue Cardizem for rate control.  Continue apixaban.  2 chronic diastolic congestive heart failure-euvolemic on examination today.  Continue fluid restriction and low-sodium diet.  3 hypertension-blood pressure controlled.  Continue present medications and follow-up.  4 hyperlipidemia-per primary care.  Kirk Ruths, MD

## 2020-03-31 DIAGNOSIS — M1711 Unilateral primary osteoarthritis, right knee: Secondary | ICD-10-CM | POA: Diagnosis not present

## 2020-03-31 DIAGNOSIS — M1712 Unilateral primary osteoarthritis, left knee: Secondary | ICD-10-CM | POA: Diagnosis not present

## 2020-03-31 DIAGNOSIS — M19012 Primary osteoarthritis, left shoulder: Secondary | ICD-10-CM | POA: Diagnosis not present

## 2020-04-07 ENCOUNTER — Ambulatory Visit: Payer: Medicare Other | Admitting: Cardiology

## 2020-04-13 ENCOUNTER — Other Ambulatory Visit: Payer: Self-pay | Admitting: Internal Medicine

## 2020-04-13 DIAGNOSIS — I480 Paroxysmal atrial fibrillation: Secondary | ICD-10-CM | POA: Diagnosis not present

## 2020-04-13 DIAGNOSIS — D582 Other hemoglobinopathies: Secondary | ICD-10-CM | POA: Diagnosis not present

## 2020-04-13 DIAGNOSIS — M1711 Unilateral primary osteoarthritis, right knee: Secondary | ICD-10-CM | POA: Diagnosis not present

## 2020-04-13 DIAGNOSIS — R1013 Epigastric pain: Secondary | ICD-10-CM

## 2020-04-13 DIAGNOSIS — M3500 Sicca syndrome, unspecified: Secondary | ICD-10-CM | POA: Diagnosis not present

## 2020-04-13 DIAGNOSIS — E041 Nontoxic single thyroid nodule: Secondary | ICD-10-CM | POA: Diagnosis not present

## 2020-04-13 DIAGNOSIS — K219 Gastro-esophageal reflux disease without esophagitis: Secondary | ICD-10-CM | POA: Diagnosis not present

## 2020-04-13 DIAGNOSIS — E785 Hyperlipidemia, unspecified: Secondary | ICD-10-CM | POA: Diagnosis not present

## 2020-04-13 DIAGNOSIS — R110 Nausea: Secondary | ICD-10-CM

## 2020-04-13 DIAGNOSIS — D751 Secondary polycythemia: Secondary | ICD-10-CM | POA: Diagnosis not present

## 2020-04-13 DIAGNOSIS — R61 Generalized hyperhidrosis: Secondary | ICD-10-CM | POA: Diagnosis not present

## 2020-04-13 DIAGNOSIS — M47816 Spondylosis without myelopathy or radiculopathy, lumbar region: Secondary | ICD-10-CM | POA: Diagnosis not present

## 2020-04-13 DIAGNOSIS — R231 Pallor: Secondary | ICD-10-CM | POA: Diagnosis not present

## 2020-04-13 DIAGNOSIS — E1143 Type 2 diabetes mellitus with diabetic autonomic (poly)neuropathy: Secondary | ICD-10-CM | POA: Diagnosis not present

## 2020-04-13 DIAGNOSIS — G4700 Insomnia, unspecified: Secondary | ICD-10-CM | POA: Diagnosis not present

## 2020-04-27 ENCOUNTER — Ambulatory Visit
Admission: RE | Admit: 2020-04-27 | Discharge: 2020-04-27 | Disposition: A | Discharge status: Home / Self Care | Payer: Medicare Other | Source: Ambulatory Visit | Attending: Internal Medicine | Admitting: Internal Medicine

## 2020-04-27 ENCOUNTER — Other Ambulatory Visit: Payer: Self-pay

## 2020-04-27 DIAGNOSIS — K760 Fatty (change of) liver, not elsewhere classified: Secondary | ICD-10-CM | POA: Diagnosis not present

## 2020-04-27 DIAGNOSIS — R1013 Epigastric pain: Secondary | ICD-10-CM

## 2020-04-27 DIAGNOSIS — R110 Nausea: Secondary | ICD-10-CM

## 2020-04-27 DIAGNOSIS — E041 Nontoxic single thyroid nodule: Secondary | ICD-10-CM

## 2020-04-27 DIAGNOSIS — K8020 Calculus of gallbladder without cholecystitis without obstruction: Secondary | ICD-10-CM | POA: Diagnosis not present

## 2020-05-05 DIAGNOSIS — E041 Nontoxic single thyroid nodule: Secondary | ICD-10-CM | POA: Diagnosis not present

## 2020-05-05 DIAGNOSIS — R110 Nausea: Secondary | ICD-10-CM | POA: Diagnosis not present

## 2020-05-05 DIAGNOSIS — D751 Secondary polycythemia: Secondary | ICD-10-CM | POA: Diagnosis not present

## 2020-05-05 DIAGNOSIS — Z807 Family history of other malignant neoplasms of lymphoid, hematopoietic and related tissues: Secondary | ICD-10-CM | POA: Diagnosis not present

## 2020-05-05 DIAGNOSIS — R61 Generalized hyperhidrosis: Secondary | ICD-10-CM | POA: Diagnosis not present

## 2020-05-05 DIAGNOSIS — R1013 Epigastric pain: Secondary | ICD-10-CM | POA: Diagnosis not present

## 2020-05-09 ENCOUNTER — Other Ambulatory Visit: Payer: Self-pay | Admitting: Cardiology

## 2020-05-10 DIAGNOSIS — R1013 Epigastric pain: Secondary | ICD-10-CM | POA: Diagnosis not present

## 2020-05-10 DIAGNOSIS — R198 Other specified symptoms and signs involving the digestive system and abdomen: Secondary | ICD-10-CM | POA: Diagnosis not present

## 2020-05-10 DIAGNOSIS — R110 Nausea: Secondary | ICD-10-CM | POA: Diagnosis not present

## 2020-05-10 DIAGNOSIS — K8020 Calculus of gallbladder without cholecystitis without obstruction: Secondary | ICD-10-CM | POA: Diagnosis not present

## 2020-05-10 DIAGNOSIS — R159 Full incontinence of feces: Secondary | ICD-10-CM | POA: Diagnosis not present

## 2020-05-11 DIAGNOSIS — H401133 Primary open-angle glaucoma, bilateral, severe stage: Secondary | ICD-10-CM | POA: Diagnosis not present

## 2020-05-11 DIAGNOSIS — H18893 Other specified disorders of cornea, bilateral: Secondary | ICD-10-CM | POA: Diagnosis not present

## 2020-05-11 DIAGNOSIS — H5316 Psychophysical visual disturbances: Secondary | ICD-10-CM | POA: Diagnosis not present

## 2020-05-11 DIAGNOSIS — M3500 Sicca syndrome, unspecified: Secondary | ICD-10-CM | POA: Diagnosis not present

## 2020-05-11 DIAGNOSIS — Z961 Presence of intraocular lens: Secondary | ICD-10-CM | POA: Diagnosis not present

## 2020-05-11 DIAGNOSIS — H538 Other visual disturbances: Secondary | ICD-10-CM | POA: Diagnosis not present

## 2020-05-11 DIAGNOSIS — H547 Unspecified visual loss: Secondary | ICD-10-CM | POA: Diagnosis not present

## 2020-05-11 DIAGNOSIS — H02403 Unspecified ptosis of bilateral eyelids: Secondary | ICD-10-CM | POA: Diagnosis not present

## 2020-05-19 ENCOUNTER — Ambulatory Visit (INDEPENDENT_AMBULATORY_CARE_PROVIDER_SITE_OTHER): Payer: Medicare Other | Admitting: Neurology

## 2020-05-19 ENCOUNTER — Other Ambulatory Visit: Payer: Self-pay

## 2020-05-19 ENCOUNTER — Encounter: Payer: Self-pay | Admitting: Neurology

## 2020-05-19 VITALS — BP 140/88 | HR 82 | Ht 61.0 in | Wt 170.0 lb

## 2020-05-19 DIAGNOSIS — M3506 Sjogren syndrome with peripheral nervous system involvement: Secondary | ICD-10-CM

## 2020-05-19 DIAGNOSIS — G63 Polyneuropathy in diseases classified elsewhere: Secondary | ICD-10-CM | POA: Diagnosis not present

## 2020-05-19 DIAGNOSIS — R441 Visual hallucinations: Secondary | ICD-10-CM | POA: Diagnosis not present

## 2020-05-19 DIAGNOSIS — Z8673 Personal history of transient ischemic attack (TIA), and cerebral infarction without residual deficits: Secondary | ICD-10-CM

## 2020-05-19 NOTE — Progress Notes (Signed)
Follow-up Visit   Date: 05/19/20    Krystal Clarke MRN: [id number] DOB: [birth year]   [name] is a 85 y.o. right-handed Caucasian female with hypertension, Sjogren's Clarke (diagnosed 2703) complicated by neuropathy and vision impairment, migraines, atrial fibrillation (on apixiban), history of shingles affecting left T6 (07/2009) and stroke (September 2014, residual word-finding difficulty) returning to the clinic for follow-up visit for neuropathy and new complaints of vision changes.    History of present illness: She was under the care of Dr. Erling Cruz at [facility] for a over 30 years for migraines, stroke, peripheral neuropathy, headaches, and gait instability. She has a long history of migraines or started at the age of 77. Her headaches were well controlled on Depakote 250 twice a day. However, it was stopped due to low platelets and anticoagulation. Previously tried medication for headaches is depakote, Botox (2 trials without benefit) and maxalt. Being on apixiban, she is unable to take NSAIDs due to bleeding risk.  She was given a prednisone taper dose with dramatic improvement of headaches and has been relatively well since then.   She was admitted to the hospital in September 2014 for community-acquired pneumonia and found to have new onset atrial fibrillation. Due to worsening headaches, MRI of the brain was obtained in October which showed a small left thalamic subacute stroke.  In 2015, she was started on Lyrica 150-300mg  for headaches and neuropathy and responded well.    In 2017, she was having sneezing spells with shaking of the arms.  This was evaluated with EEG which was normal, this became worse again in 2018, again EEG was negative. She was hospitalized in November 2018 with colovesical fistula s/p robotic colectomy & bladder closure 02/07/2016.  UPDATE 05/19/2020:  She is here for follow-up visit with daughter.  Over the past few weeks, she began having  visual changes described as seeing bright green lights and lines in her vision. On one occasion, she saw many little rabbits.  She saw her ophthalmologist who diagnosed her with Krystal Clarke and suggested that she get MRI brain to be sure there was no other cause for this abrupt change.  She denies double vision.  Overall vision continues to be poor and getting worse.  Her neuropathy remains well-controlled on Lyrica 100mg  twice dialy.  No new headaches.  The past few months have been very bothersome because of ongoing abdominal pain, without clear explanation.  Medications:  Current Outpatient Medications on File Prior to Visit  Medication Sig Dispense Refill  . diltiazem (CARDIZEM CD) 180 MG 24 hr capsule TAKE 1 CAPSULE (180 MG TOTAL) BY MOUTH DAILY. 90 capsule 3  . dorzolamide (TRUSOPT) 2 % ophthalmic solution Place 1 drop into both eyes 2 (two) times daily.    . dorzolamide-timolol (COSOPT) 22.3-6.8 MG/ML ophthalmic solution     . ELIQUIS 5 MG TABS tablet TAKE 1 TABLET BY MOUTH TWICE A DAY 180 tablet 1  . fenofibrate (TRICOR) 145 MG tablet Take 145 mg by mouth at bedtime.   0  . omeprazole-sodium bicarbonate (ZEGERID) 40-1100 MG capsule     . ondansetron (ZOFRAN) 8 MG tablet TAKE ONE TABLET EVERY 6 HOURS AS NEEDED FOR NAUSEA ORALLY 10 DAYS  0  . pantoprazole (PROTONIX) 40 MG tablet Take 40 mg by mouth daily.    . pregabalin (LYRICA) 100 MG capsule Take 1 capsule (100 mg total) by mouth 2 (two) times daily. 60 capsule 5  . tizanidine (ZANAFLEX) 2 MG capsule  TAKE 1 CAPSULE (2 MG TOTAL) BY MOUTH 3 (THREE) TIMES DAILY. 270 capsule 3  . traMADol (ULTRAM) 50 MG tablet     . cyclobenzaprine (FLEXERIL) 5 MG tablet TAKE 1 TABLET BY MOUTH AT BEDTIME (Patient not taking: No sig reported) 90 tablet 3   No current facility-administered medications on file prior to visit.    Allergies:  Allergies  Allergen Reactions  . Morphine   . Oxycodone-Aspirin Nausea And Vomiting  . Codeine Nausea  And Vomiting and Other (See Comments)  . Hydrocodone Nausea And Vomiting  . Morphine And Related Nausea And Vomiting  . Oxycodone Nausea And Vomiting  . Pilocarpine Other (See Comments)    Reaction:  Unknown   . Rivaroxaban Other (See Comments)    Reaction:  Back pain/headaches      Vital Signs:  BP 140/88   Pulse 82   Ht 5\' 1"  (1.549 m)   Wt 170 lb (77.1 kg)   SpO2 95%   BMI 32.12 kg/m   Neurological Exam: MENTAL STATUS including orientation to time, place, person, recent and remote memory, attention span and concentration, language, and fund of knowledge is fair.   Speech is not dysarthric.    CRANIAL NERVES:  Severe visual impairment bilaterally, blind in the right eye, gross movements in the left.  Mild bilateral ptosis. Face is symmetric.   MOTOR: Motor strength is 5-/5 proximally.  Left dorsiflexion and toe extension is 4/5.     SENSORY:  Vibration is intact at the knees, absent distal to ankles.  COORDINATION/GAIT: Gait is moderately wide-based, slow, assisted with rollator  Data: MRI brain wo contrast 12/23/2012: Very small acute/ subacute left thalamic nonhemorrhagic infarct. Prominent small vessel disease type changes. No intracranial hemorrhage. Mild atrophy without hydrocephalus.  Prior work-up at [place]: EEG 06/03/1995, 08/31/2008, 05/25/2015: normal  CSF 1991: normal  Ambulatory EEG 03/24/2017:  Normal  MRI brain 11/02/2017:  Moderate atrophy, with progression since 2014. Mild chronic microvascular ischemic changes similar to the prior study.  No acute abnormality.  Lab Results  Component Value Date   [id number] 580 04/12/2016   Lab Results  Component Value Date   TSH 0.834 12/06/2012    IMPRESSION/PLAN:   1.  Peripheral neuropathy due to Sjogren's disease, stable.   - Adequately controlled on Lyrica 100mg  BID  2.  Visual hallucinations, likely due to progression vision loss as can be seen with Krystal Clarke  - MRI brain wo contrast to exclude  intracranial cause  3. Tension headaches, resolved  - OK to use tizanidine 2mg  as needed  4.  Migraine without aura (resolved).  Previously used:  VPA (stopped due to low PLT), triptans (stroke), flexeril (no improvement), botox, medrol dose pack (effective)  5. History of left thalamic stroke, likely cardioembolic due to afib on apixiban with mild residual expressive aphasia  6.  Mild vascular dementia.  Stable.  7.  Chronic debility from medical comorbidities (neuropathy, visual impairment, arthritis)   Return to clinic in 6 months    Thank you for allowing me to participate in patient's care.  If I can answer any additional questions, I would be pleased to do so.    Sincerely,    Nashira Mcglynn K. Posey Pronto, DO

## 2020-05-19 NOTE — Patient Instructions (Addendum)
MRI brain without contrast  You can try melatonin for your sleep  Return to clinic in 6 months

## 2020-05-31 DIAGNOSIS — K8020 Calculus of gallbladder without cholecystitis without obstruction: Secondary | ICD-10-CM | POA: Diagnosis not present

## 2020-05-31 DIAGNOSIS — R1013 Epigastric pain: Secondary | ICD-10-CM | POA: Diagnosis not present

## 2020-05-31 DIAGNOSIS — R198 Other specified symptoms and signs involving the digestive system and abdomen: Secondary | ICD-10-CM | POA: Diagnosis not present

## 2020-05-31 DIAGNOSIS — R159 Full incontinence of feces: Secondary | ICD-10-CM | POA: Diagnosis not present

## 2020-05-31 DIAGNOSIS — R110 Nausea: Secondary | ICD-10-CM | POA: Diagnosis not present

## 2020-06-01 ENCOUNTER — Other Ambulatory Visit: Payer: Self-pay

## 2020-06-01 ENCOUNTER — Emergency Department (HOSPITAL_COMMUNITY): Payer: Medicare Other

## 2020-06-01 ENCOUNTER — Emergency Department (HOSPITAL_COMMUNITY)
Admission: EM | Admit: 2020-06-01 | Discharge: 2020-06-01 | Disposition: A | Discharge status: Home / Self Care | Payer: Medicare Other | Attending: Emergency Medicine | Admitting: Emergency Medicine

## 2020-06-01 ENCOUNTER — Encounter (HOSPITAL_COMMUNITY): Payer: Self-pay

## 2020-06-01 DIAGNOSIS — E1140 Type 2 diabetes mellitus with diabetic neuropathy, unspecified: Secondary | ICD-10-CM | POA: Diagnosis not present

## 2020-06-01 DIAGNOSIS — W1830XA Fall on same level, unspecified, initial encounter: Secondary | ICD-10-CM | POA: Insufficient documentation

## 2020-06-01 DIAGNOSIS — I10 Essential (primary) hypertension: Secondary | ICD-10-CM | POA: Diagnosis not present

## 2020-06-01 DIAGNOSIS — M25511 Pain in right shoulder: Secondary | ICD-10-CM | POA: Insufficient documentation

## 2020-06-01 DIAGNOSIS — R519 Headache, unspecified: Secondary | ICD-10-CM | POA: Diagnosis not present

## 2020-06-01 DIAGNOSIS — X501XXA Overexertion from prolonged static or awkward postures, initial encounter: Secondary | ICD-10-CM | POA: Insufficient documentation

## 2020-06-01 DIAGNOSIS — I480 Paroxysmal atrial fibrillation: Secondary | ICD-10-CM | POA: Insufficient documentation

## 2020-06-01 DIAGNOSIS — M25521 Pain in right elbow: Secondary | ICD-10-CM | POA: Diagnosis not present

## 2020-06-01 DIAGNOSIS — R102 Pelvic and perineal pain: Secondary | ICD-10-CM | POA: Diagnosis not present

## 2020-06-01 DIAGNOSIS — W19XXXA Unspecified fall, initial encounter: Secondary | ICD-10-CM

## 2020-06-01 DIAGNOSIS — M25552 Pain in left hip: Secondary | ICD-10-CM | POA: Diagnosis not present

## 2020-06-01 DIAGNOSIS — Z7901 Long term (current) use of anticoagulants: Secondary | ICD-10-CM | POA: Diagnosis not present

## 2020-06-01 DIAGNOSIS — M542 Cervicalgia: Secondary | ICD-10-CM | POA: Diagnosis not present

## 2020-06-01 DIAGNOSIS — Z87891 Personal history of nicotine dependence: Secondary | ICD-10-CM | POA: Insufficient documentation

## 2020-06-01 DIAGNOSIS — M79601 Pain in right arm: Secondary | ICD-10-CM | POA: Diagnosis not present

## 2020-06-01 DIAGNOSIS — M25519 Pain in unspecified shoulder: Secondary | ICD-10-CM | POA: Diagnosis not present

## 2020-06-01 DIAGNOSIS — M7989 Other specified soft tissue disorders: Secondary | ICD-10-CM | POA: Diagnosis not present

## 2020-06-01 DIAGNOSIS — Z79899 Other long term (current) drug therapy: Secondary | ICD-10-CM | POA: Insufficient documentation

## 2020-06-01 DIAGNOSIS — M25559 Pain in unspecified hip: Secondary | ICD-10-CM | POA: Diagnosis not present

## 2020-06-01 DIAGNOSIS — R0902 Hypoxemia: Secondary | ICD-10-CM | POA: Diagnosis not present

## 2020-06-01 DIAGNOSIS — M19011 Primary osteoarthritis, right shoulder: Secondary | ICD-10-CM | POA: Diagnosis not present

## 2020-06-01 MED ORDER — HEPARIN (PORCINE) IN NACL 1000-0.9 UT/500ML-% IV SOLN
INTRAVENOUS | Status: AC
  Filled 2020-06-01: qty 1000

## 2020-06-01 MED ORDER — MIDAZOLAM HCL 2 MG/2ML IJ SOLN
INTRAMUSCULAR | Status: AC
  Filled 2020-06-01: qty 2

## 2020-06-01 MED ORDER — FENTANYL CITRATE (PF) 100 MCG/2ML IJ SOLN
INTRAMUSCULAR | Status: AC
  Filled 2020-06-01: qty 2

## 2020-06-01 MED ORDER — LIDOCAINE HCL (PF) 1 % IJ SOLN
INTRAMUSCULAR | Status: AC
  Filled 2020-06-01: qty 30

## 2020-06-01 MED ORDER — VERAPAMIL HCL 2.5 MG/ML IV SOLN
INTRAVENOUS | Status: AC
  Filled 2020-06-01: qty 2

## 2020-06-01 MED ORDER — FENTANYL CITRATE (PF) 100 MCG/2ML IJ SOLN
100.0000 ug | Freq: Once | INTRAMUSCULAR | Status: AC
  Administered 2020-06-01: 100 ug via INTRAVENOUS
  Filled 2020-06-01: qty 2

## 2020-06-01 MED ORDER — HEPARIN SODIUM (PORCINE) 1000 UNIT/ML IJ SOLN
INTRAMUSCULAR | Status: AC
  Filled 2020-06-01: qty 1

## 2020-06-01 NOTE — ED Notes (Signed)
Patient transported to X-ray 

## 2020-06-01 NOTE — ED Notes (Signed)
Returned form xray

## 2020-06-01 NOTE — ED Notes (Signed)
Help get patient undressed into a gown on the monitor patient is now gone to xray

## 2020-06-01 NOTE — ED Triage Notes (Signed)
Patient on blood thinner.

## 2020-06-01 NOTE — ED Triage Notes (Signed)
Patient lost balance and fell onto right shoulder/upper arm. No LOC, right upper arm deformity. No other pain. 200 mcg fentanyl give, Patient A/A/O. BP 188 systolic.

## 2020-06-01 NOTE — ED Notes (Signed)
Remains A/A/O. Daughter on her way for patient discharge.

## 2020-06-01 NOTE — Discharge Instructions (Signed)
As discussed, it is normal to feel worse in the days immediately following a fall regardless of medication use. ° °However, please take all medication as directed, use ice packs liberally.  If you develop any new, or concerning changes in your condition, please return here for further evaluation and management.   ° °Otherwise, please return followup with your physician ° ° ° °

## 2020-06-01 NOTE — ED Provider Notes (Signed)
[facility] EMERGENCY DEPARTMENT Provider Note   CSN: [id number] Arrival date & time: 06/01/20  6568     History Chief Complaint  Patient presents with  . Fall    Krystal Clarke is a 85 y.o. female.  HPI Patient presents via EMS after a fall.  She notes that she was in her usual state of health prior to the event.  She recalls falling after twisting, losing her balance. She fell onto her right side.  She denies head trauma, any head or neck pain following the event.  However, the patient has had severe sustained pain in the right shoulder, right elbow since the fall.  EMS reports the patient was awake and alert, talkative throughout transport.  She received 200 mcg of fentanyl, and she states that that did not improve her pain substantially. She denies hip pain, chest pain, any weakness anywhere. EMS notes the patient was mildly tachycardic, hypertensive in route.  Past Medical History:  Diagnosis Date  . Acute respiratory failure with hypoxia ([place]) 12/06/2012  . Allergy   . Blindness of right eye   . Complication of anesthesia   . Dehydration with hyponatremia 12/07/2012  . Diabetes mellitus without complication ([place])    diagnosed 2 weeks ago- No CBG meter as of yet.  Marland Kitchen DIVERTICULITIS, HX OF 01/27/2009   Qualifier: Diagnosis of  By: Jerold Coombe    . Gait disorder 06/05/2012  . GERD (gastroesophageal reflux disease)   . Glaucoma   . Glucose intolerance (impaired glucose tolerance)   . History of diverticulitis   . Hypertension   . Legally blind   . Migraine triggered seizures ([place])    history of  . Migraines   . Neuropathy   . OP (osteoporosis)   . Osteoarthritis    osteoarthritis -right knee. uses walker. Left shoulder -"Limited ROM"  . Paroxysmal atrial fibrillation ([place]) 12/06/2012   Echo (12/07/12): Moderate focal basal hypertrophy of the septum, vigorous LV function, EF 65-70%, indeterminate diastolic function, normal wall motion, trivial MR,  moderate LAE, trivial TR;  Xarelto started 11/2012  . Peripheral neuropathy    neuropathy  . Pneumonia   . PONV (postoperative nausea and vomiting)    no problems other past few surgeries  . SHINGLES, HX OF 07/17/2009   Qualifier: Diagnosis of  By: Jerold Coombe    . Siriasis   . Sjogren's syndrome ([place])    bilateral vision  . Sleep apnea    does not wear CPAP  . Stroke [facility])    residual - rare Intermittent expressive aphasia,    Patient Active Problem List   Diagnosis Date Noted  . Stroke ([place])   . Siriasis   . PONV (postoperative nausea and vomiting)   . Pneumonia   . Peripheral neuropathy   . OP (osteoporosis)   . Neuropathy   . Migraines   . Migraine triggered seizures ([place])   . Legally blind   . Hypertension   . History of diverticulitis   . Glucose intolerance (impaired glucose tolerance)   . Glaucoma   . GERD (gastroesophageal reflux disease)   . Complication of anesthesia   . Blindness of right eye   . Allergy   . GI bleed 04/16/2016  . Symptomatic anemia 04/16/2016  . Hypoxia - on oxygen 02/10/2016  . Diabetes mellitus without complication ([place])   . Obesity (BMI 30-39.9) 02/08/2016  . Lichen sclerosus 12/75/1700  . 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 02/07/2016 02/07/2016  .  Chest pain 11/04/2014  . Elevated white blood cell count 11/04/2014  . B12 deficiency 05/20/2013  . Seizure disorder ([place]) 12/07/2012  . Dehydration with hyponatremia 12/07/2012  . Atrial fibrillation ([place]) 12/06/2012  . Paroxysmal atrial fibrillation ([place]) 12/06/2012  . Acute respiratory failure with hypoxia ([place]) 12/06/2012  . Gait disorder 06/05/2012  . Sjogren's syndrome ([place])   . Hereditary and idiopathic peripheral neuropathy 05/09/2012  . 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 ([place]) 05/09/2012  . History of detached retina repair 02/24/2012  . Primary open angle glaucoma of both eyes, severe stage 02/24/2012  . ANEMIA, B12  DEFICIENCY 07/19/2009  . Anxiety state 01/27/2009  . Sleep apnea 12/19/2008  . MIXED HYPERLIPIDEMIA 07/18/2008  . HYPERTRIGLYCERIDEMIA 08/28/2006  . Migraine headache 08/28/2006  . Essential hypertension 08/28/2006  . GERD 08/28/2006  . PSORIASIS 08/28/2006  . Osteoarthritis 08/28/2006  . OSTEOPOROSIS 08/28/2006    Past Surgical History:  Procedure Laterality Date  . BONE TUMOR EXCISION Right    arm  . CATARACT EXTRACTION    . COLONOSCOPY WITH PROPOFOL N/A 06/09/2015   Procedure: COLONOSCOPY WITH PROPOFOL;  Surgeon: Ronald Lobo, MD;  Location: [place];  Service: Endoscopy;  Laterality: N/A;  . DILATION AND CURETTAGE OF UTERUS    . ESOPHAGOGASTRODUODENOSCOPY N/A 04/17/2016   Procedure: ESOPHAGOGASTRODUODENOSCOPY (EGD);  Surgeon: Teena Irani, MD;  Location[facility] ENDOSCOPY;  Service: Endoscopy;  Laterality: N/A;  . ESOPHAGOGASTRODUODENOSCOPY (EGD) WITH PROPOFOL N/A 06/09/2015   Procedure: ESOPHAGOGASTRODUODENOSCOPY (EGD) WITH PROPOFOL;  Surgeon: Ronald Lobo, MD;  Location: [facility] ENDOSCOPY;  Service: Endoscopy;  Laterality: N/A;  . EYE SURGERY    . FOOT FRACTURE SURGERY Left 2007  . GIVENS CAPSULE STUDY N/A 04/17/2016   Procedure: GIVENS CAPSULE STUDY;  Surgeon: Teena Irani, MD;  Location: [place];  Service: Endoscopy;  Laterality: N/A;  . KNEE ARTHROSCOPY Right   . LUMBAR LAMINECTOMY    . ROBOT ASSISTED LAPAROSCOPIC PARTIAL COLECTOMY  02/07/2016   Procedure: ROBOT ASSISTED LAPAROSCOPIC PARTIAL COLECTOMY;  Surgeon: Leighton Ruff, MD;  Location: [facility];  Service: General;;  . SHOULDER OPEN ROTATOR CUFF REPAIR    . TONSILLECTOMY AND ADENOIDECTOMY    . TOTAL ABDOMINAL HYSTERECTOMY W/ BILATERAL SALPINGOOPHORECTOMY       OB History   No obstetric history on file.     Family History  Problem Relation Age of Onset  . Stroke Mother   . Migraines Mother   . Stomach cancer Father   . Sjogren's syndrome Father   . Sjogren's syndrome Brother   . Sjogren's syndrome Brother   .  Scleroderma Sister        raynaud's scleroderma  . Lymphoma Daughter   . Migraines Daughter   . Heart disease Other        maternal side  . Stroke Other        maternal side, 1st degree female <60    Social History   Tobacco Use  . Smoking status: Former Smoker    Types: Cigarettes  . Smokeless tobacco: Never Used  . Tobacco comment: quit smoking before age of 30  Vaping Use  . Vaping Use: Never used  Substance Use Topics  . Alcohol use: No    Alcohol/week: 0.0 standard drinks  . Drug use: No    Home Medications Prior to Admission medications   Medication Sig Start Date End Date Taking? Authorizing Provider  cyclobenzaprine (FLEXERIL) 5 MG tablet TAKE 1 TABLET BY MOUTH AT BEDTIME Patient not taking: No sig reported 11/10/19  Patel, Donika K, DO  diltiazem (CARDIZEM CD) 180 MG 24 hr capsule TAKE 1 CAPSULE (180 MG TOTAL) BY MOUTH DAILY. 11/26/19   Lelon Perla, MD  dorzolamide (TRUSOPT) 2 % ophthalmic solution Place 1 drop into both eyes 2 (two) times daily.    [provider]  dorzolamide-timolol (COSOPT) 22.3-6.8 MG/ML ophthalmic solution  10/29/16   [provider]  ELIQUIS 5 MG TABS tablet TAKE 1 TABLET BY MOUTH TWICE A DAY 05/09/20   Lelon Perla, MD  fenofibrate (TRICOR) 145 MG tablet Take 145 mg by mouth at bedtime.     [provider]  omeprazole-sodium bicarbonate (ZEGERID) 40-1100 MG capsule  09/10/18   [provider]  ondansetron (ZOFRAN) 8 MG tablet TAKE ONE TABLET EVERY 6 HOURS AS NEEDED FOR NAUSEA ORALLY 10 DAYS 07/10/17   [provider]  pantoprazole (PROTONIX) 40 MG tablet Take 40 mg by mouth daily.    [provider]  pregabalin (LYRICA) 100 MG capsule Take 1 capsule (100 mg total) by mouth 2 (two) times daily. 11/25/19   Patel, Arvin Collard K, DO  tizanidine (ZANAFLEX) 2 MG capsule TAKE 1 CAPSULE (2 MG TOTAL) BY MOUTH 3 (THREE) TIMES DAILY. 01/12/19   Alda Berthold, DO  traMADol Veatrice [place]bon) 50 MG tablet   09/01/18   [provider]    Allergies    Morphine, Oxycodone-aspirin, Codeine, Hydrocodone, Morphine and related, Oxycodone, Pilocarpine, and Rivaroxaban  Review of Systems   Review of Systems  Constitutional:       Per HPI, otherwise negative  HENT:       Per HPI, otherwise negative  Eyes: Negative for visual disturbance.  Respiratory:       Per HPI, otherwise negative  Cardiovascular:       Per HPI, otherwise negative  Gastrointestinal: Negative for vomiting.  Endocrine:       Negative aside from HPI  Genitourinary:       Neg aside from HPI   Musculoskeletal:       Per HPI, otherwise negative  Skin: Negative.   Neurological: Negative for syncope, weakness and headaches.    Physical Exam Updated Vital Signs BP 135/82 (BP Location: Left Arm)   Pulse (!) 106   Temp (!) 97.5 F (36.4 C) (Oral)   Resp 16   Ht 5' (1.524 m)   Wt 74.8 kg   SpO2 96%   BMI 32.22 kg/m   Physical Exam Vitals and nursing note reviewed.  Constitutional:      General: She is not in acute distress.    Appearance: She is well-developed.  HENT:     Head: Normocephalic and atraumatic.     Comments: Head pain, no evidence for trauma, no complaints Eyes:     Conjunctiva/sclera: Conjunctivae normal.  Cardiovascular:     Rate and Rhythm: Regular rhythm. Tachycardia present.  Pulmonary:     Effort: Pulmonary effort is normal. No respiratory distress.     Breath sounds: Normal breath sounds. No stridor.  Abdominal:     General: There is no distension.  Musculoskeletal:     Cervical back: Full passive range of motion without pain and neck supple. No spinous process tenderness or muscular tenderness.       Legs:     Comments: Right wrist unremarkable, right elbow tender to palpation, patient unwilling to move it beyond 90 degree where it is in a sling.  Right shoulder gross deformity with loss of angle of the upper arm.  Patient unwilling  to move the arm for evaluation.  Skin:     General: Skin is warm and dry.  Neurological:     Mental Status: She is alert and oriented to person, place, and time.     Cranial Nerves: No cranial nerve deficit or dysarthria.     Motor: No tremor or abnormal muscle tone.     Coordination: Coordination is intact.      ED Results / Procedures / Treatments   Labs (all labs ordered are listed, but only abnormal results are displayed) Labs Reviewed - No data to display  EKG None  Radiology DG Pelvis 1-2 Views  Result Date: 06/01/2020 CLINICAL DATA:  Pelvic pain after a fall today.  Initial encounter. EXAM: PELVIS - 1-2 VIEW COMPARISON:  None. FINDINGS: Bones appear osteopenic. None no acute bony or joint abnormality is seen. Lower lumbar spondylosis is noted. Soft tissues are negative. IMPRESSION: No acute abnormality. Osteopenia. Electronically Signed   By: Inge Rise M.D.   On: 06/01/2020 10:29   DG Shoulder Right  Result Date: 06/01/2020 CLINICAL DATA:  Fall, right arm and shoulder pain EXAM: RIGHT SHOULDER - 2+ VIEW COMPARISON:  None. FINDINGS: Advanced degenerative changes within the right [facility] joint with mild degenerative changes in the glenohumeral joint. Calcifications adjacent to the greater tuberosity compatible with chronic rotator cuff tendinopathy. No acute bony abnormality. Specifically, no fracture, subluxation, or dislocation. IMPRESSION: No acute bony abnormality. Electronically Signed   By: Rolm Baptise M.D.   On: 06/01/2020 10:27   DG Elbow Complete Right  Result Date: 06/01/2020 CLINICAL DATA:  Right elbow pain after fall EXAM: RIGHT ELBOW - COMPLETE 3+ VIEW COMPARISON:  None. FINDINGS: No evidence of acute fracture. No dislocation. Anterior fat pad is visible without elevation of the posterior fat pad. Findings are equivocal for joint effusion. Prominent enthesopathic changes at the lateral epicondyle, likely related to underlying tendinosis of the common extensor tendon origin. Soft tissue swelling most  pronounced at the dorsum of the elbow. IMPRESSION: 1. No evidence of acute fracture or dislocation. 2. Prominent enthesopathic changes at the lateral epicondyle, likely related to underlying tendinosis of the common extensor tendon origin. 3. Soft tissue swelling most pronounced at the dorsum of the elbow. Electronically Signed   By: Davina Poke D.O.   On: 06/01/2020 10:28      Medications Ordered in ED Medications  fentaNYL (SUBLIMAZE) injection 100 mcg (100 mcg Intravenous Given 06/01/20 0933)    ED Course  I have reviewed the triage vital signs and the nursing notes.  Pertinent labs & imaging results that were available during my care of the patient were reviewed by me and considered in my medical decision making (see chart for details).    11:07 AM Repeat exam patient is awake, alert, in no distress.  I reviewed her x-rays, and I discussed them with her. On repeat exam she states that her pain is essentially gone.  She is moving the affected extremities spontaneously, and has slightly diminished range of motion of the right shoulder, but no evidence for dislocation.  Elbow range of motion also substantially improved.  She now notes that she has had pain on her right side for about the past 2 weeks, and feels as though this was exacerbated following a fall. She has no hip pain, moves the hip freely, bends the knee completely.  We again discussed indications for CT of her head, and without any neurologic complaints, any pain, and denial of head trauma she declines that  evaluation. Patient notes that she is comfortable with going home, and is discharged in stable condition following her fall. MDM Rules/Calculators/A&P MDM Number of Diagnoses or Management Options Fall, initial encounter: new, needed workup   Amount and/or Complexity of Data Reviewed Tests in the radiology section of CPT: reviewed Decide to obtain previous medical records or to obtain history from someone other than  the patient: yes Obtain history from someone other than the patient: yes Review and summarize past medical records: yes Independent visualization of images, tracings, or specimens: yes  Risk of Complications, Morbidity, and/or Mortality Presenting problems: high Diagnostic procedures: high Management options: high  Critical Care Total time providing critical care: < 30 minutes  Patient Progress Patient progress: stable  Final Clinical Impression(s) / ED Diagnoses Final diagnoses:  Fall, initial encounter     Carmin Muskrat, MD 06/01/20 1108

## 2020-06-08 ENCOUNTER — Other Ambulatory Visit: Payer: Self-pay

## 2020-06-08 ENCOUNTER — Ambulatory Visit
Admission: RE | Admit: 2020-06-08 | Discharge: 2020-06-08 | Disposition: A | Discharge status: Home / Self Care | Payer: Medicare Other | Source: Ambulatory Visit | Attending: Neurology | Admitting: Neurology

## 2020-06-08 DIAGNOSIS — I616 Nontraumatic intracerebral hemorrhage, multiple localized: Secondary | ICD-10-CM | POA: Diagnosis not present

## 2020-06-08 DIAGNOSIS — Z8673 Personal history of transient ischemic attack (TIA), and cerebral infarction without residual deficits: Secondary | ICD-10-CM

## 2020-06-08 DIAGNOSIS — G319 Degenerative disease of nervous system, unspecified: Secondary | ICD-10-CM | POA: Diagnosis not present

## 2020-06-08 DIAGNOSIS — R441 Visual hallucinations: Secondary | ICD-10-CM

## 2020-06-08 DIAGNOSIS — I639 Cerebral infarction, unspecified: Secondary | ICD-10-CM | POA: Diagnosis not present

## 2020-06-08 DIAGNOSIS — I10 Essential (primary) hypertension: Secondary | ICD-10-CM | POA: Diagnosis not present

## 2020-06-13 ENCOUNTER — Telehealth: Payer: Self-pay

## 2020-06-13 NOTE — Telephone Encounter (Signed)
-----   Message from Alda Berthold, DO sent at 06/13/2020 11:54 AM EDT ----- Please inform pt her MRI brain shows progressive age-related changes, there are a few old tiny microbleeds, which can be seen in individuals on anticoagulation.  They are very small and would not be causing any of her visual symptoms [facility]ran Needs Syndrome).  Thanks.

## 2020-06-13 NOTE — Telephone Encounter (Signed)
Called and spoke to patients daughter Cynde per DPR and informed her of MRI results. Patients daughter verbalized understanding and will pass results on to patient.

## 2020-06-29 ENCOUNTER — Ambulatory Visit: Payer: Medicare Other | Admitting: Neurology

## 2020-07-14 ENCOUNTER — Ambulatory Visit: Payer: Self-pay | Admitting: Surgery

## 2020-07-14 DIAGNOSIS — K8020 Calculus of gallbladder without cholecystitis without obstruction: Secondary | ICD-10-CM | POA: Diagnosis not present

## 2020-07-14 NOTE — H&P (Signed)
Surgical Evaluation  Chief Complaint: nausea  HPI: 85 year old woman on Eliquis with multiple medical problems including history of stroke, sleep apnea, Sjogren syndrome, glaucoma, blindness, psoriasis, pneumonia, peripheral neuropathy, paroxysmal atrial fibrillation, arthritis, history of migraines/seizures, hypertension, diverticulitis, GERD, diabetes.  She has had epigastric pain, chronic nausea and reflux for over 4 years. For the last 8 months she has had nearly daily nausea, which is worse in the morning and subsides around 2 or 3 in the afternoon. She has been able to eat well, has not been vomiting. She also notes left and abdominal pain. Denies any known right abdominal pain. She has had a fairly extensive workup as noted below, her primary care doctor is Dr. Harlan Stains and her gastroenterologist is Dr. Cristina Gong. She lives at home with her husband, their daughter also stays with them and is their medical and general caregiver. 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  She had an ultrasound of the right upper quadrant February of this year demonstrating hepatic steatosis and gallstones without evidence of cholecystitis. Common bile duct normal caliber.  She was treated with ursodiol for several months and had some transient improvement in her symptoms with this, but they have since returned.  Upper GI in October of last year demonstrates esophageal dysmotility with poor propagation and occasional tertiary contractions with moderate esophageal stasis, no mass or stricture. A small hiatal hernia was noted, no reflux demonstrated. Incidentally noted a moderate sized duodenal diverticulum. She had an upper endoscopy with Dr. Teena Irani in January 2018 which was normal  CT of the chest one year ago noted markedly left atrial dilatation which was new, and hepatic steatosis. Last echo in the system was March 2019 noting an ejection fraction of 55-60%, moderately dilated left  atrium,  Surgical history includes total abdominal hysterectomy with bilateral salpingo-oophorectomy, robotic sigmoid colectomy by Dr. Marcello Moores in 2017 for colovesical fistula.    Allergies  Allergen Reactions  . Morphine   . Oxycodone-Aspirin Nausea And Vomiting  . Codeine Nausea And Vomiting and Other (See Comments)  . Hydrocodone Nausea And Vomiting  . Morphine And Related Nausea And Vomiting  . Oxycodone Nausea And Vomiting  . Pilocarpine Other (See Comments)    Reaction:  Unknown   . Rivaroxaban Other (See Comments)    Reaction:  Back pain/headaches     Past Medical History:  Diagnosis Date  . Acute respiratory failure with hypoxia ([place]) 12/06/2012  . Allergy   . Blindness of right eye   . Complication of anesthesia   . Dehydration with hyponatremia 12/07/2012  . Diabetes mellitus without complication ([place])    diagnosed 2 weeks ago- No CBG meter as of yet.  Marland Kitchen DIVERTICULITIS, HX OF 01/27/2009   Qualifier: Diagnosis of  By: Jerold Coombe    . Gait disorder 06/05/2012  . GERD (gastroesophageal reflux disease)   . Glaucoma   . Glucose intolerance (impaired glucose tolerance)   . History of diverticulitis   . Hypertension   . Legally blind   . Migraine triggered seizures ([place])    history of  . Migraines   . Neuropathy   . OP (osteoporosis)   . Osteoarthritis    osteoarthritis -right knee. uses walker. Left shoulder -"Limited ROM"  . Paroxysmal atrial fibrillation ([place]) 12/06/2012   Echo (12/07/12): Moderate focal basal hypertrophy of the septum, vigorous LV function, EF 65-70%, indeterminate diastolic function, normal wall motion, trivial MR, moderate LAE, trivial TR;  Xarelto started 11/2012  . Peripheral neuropathy  neuropathy  . Pneumonia   . PONV (postoperative nausea and vomiting)    no problems other past few surgeries  . SHINGLES, HX OF 07/17/2009   Qualifier: Diagnosis of  By: Janit BernLowne DO, Yvonne    . Siriasis   . Sjogren's syndrome (HCC)    bilateral vision  .  Sleep apnea    does not wear CPAP  . Stroke [facility](HCC)    residual - rare Intermittent expressive aphasia,    Past Surgical History:  Procedure Laterality Date  . BONE TUMOR EXCISION Right    arm  . CATARACT EXTRACTION    . COLONOSCOPY WITH PROPOFOL N/A 06/09/2015   Procedure: COLONOSCOPY WITH PROPOFOL;  Surgeon: Bernette Redbirdobert Buccini, MD;  [facility];  Service: Endoscopy;  Laterality: N/A;  . DILATION AND CURETTAGE OF UTERUS    . ESOPHAGOGASTRODUODENOSCOPY N/A 04/17/2016   Procedure: ESOPHAGOGASTRODUODENOSCOPY (EGD);  Surgeon: Dorena CookeyJohn Hayes, MD;  [facility];  Service: Endoscopy;  Laterality: N/A;  . ESOPHAGOGASTRODUODENOSCOPY (EGD) WITH PROPOFOL N/A 06/09/2015   Procedure: ESOPHAGOGASTRODUODENOSCOPY (EGD) WITH PROPOFOL;  Surgeon: Bernette Redbirdobert Buccini, MD;  [facility];  Service: Endoscopy;  Laterality: N/A;  . EYE SURGERY    . FOOT FRACTURE SURGERY Left 2007  . GIVENS CAPSULE STUDY N/A 04/17/2016   Procedure: GIVENS CAPSULE STUDY;  Surgeon: Dorena CookeyJohn Hayes, MD;  [facility];  Service: Endoscopy;  Laterality: N/A;  . KNEE ARTHROSCOPY Right   . LUMBAR LAMINECTOMY    . ROBOT ASSISTED LAPAROSCOPIC PARTIAL COLECTOMY  02/07/2016   Procedure: ROBOT ASSISTED LAPAROSCOPIC PARTIAL COLECTOMY;  Surgeon: Romie LeveeAlicia Thomas, MD;  Location: [facility] ORS;  Service: General;;  . SHOULDER OPEN ROTATOR CUFF REPAIR    . TONSILLECTOMY AND ADENOIDECTOMY    . TOTAL ABDOMINAL HYSTERECTOMY W/ BILATERAL SALPINGOOPHORECTOMY      Family History  Problem Relation Age of Onset  . Stroke Mother   . Migraines Mother   . Stomach cancer Father   . Sjogren's syndrome Father   . Sjogren's syndrome Brother   . Sjogren's syndrome Brother   . Scleroderma Sister        raynaud's scleroderma  . Lymphoma Daughter   . Migraines Daughter   . Heart disease Other        maternal side  . Stroke Other        maternal side, 1st degree female <60    Social History   Socioeconomic History  . Marital status:  Married    Spouse name: jesse  . Number of children: 2  . Years of education: college  . Highest education level: Not on file  Occupational History  . Occupation: retired  Tobacco Use  . Smoking status: Former Smoker    Types: Cigarettes  . Smokeless tobacco: Never Used  . Tobacco comment: quit smoking before age of 85  Vaping Use  . Vaping Use: Never used  Substance and Sexual Activity  . Alcohol use: No    Alcohol/week: 0.0 standard drinks  . Drug use: No  . Sexual activity: Not on file  Other Topics Concern  . Not on file  Social History Narrative   She lives with husband of 61 years.  They have a one story home.  One daughter.    Right handed    Drinks caffeine    Social Determinants of Health   Financial Resource Strain: Not on file  Food Insecurity: Not on file  Transportation Needs: Not on file  Physical Activity: Not on file  Stress: Not on file  Social Connections: Not on file    Current Outpatient Medications on File Prior to Visit  Medication Sig Dispense Refill  . cyclobenzaprine (FLEXERIL) 5 MG tablet TAKE 1 TABLET BY MOUTH AT BEDTIME (Patient not taking: No sig reported) 90 tablet 3  . diltiazem (CARDIZEM CD) 180 MG 24 hr capsule TAKE 1 CAPSULE (180 MG TOTAL) BY MOUTH DAILY. 90 capsule 3  . dorzolamide (TRUSOPT) 2 % ophthalmic solution Place 1 drop into both eyes 2 (two) times daily.    . dorzolamide-timolol (COSOPT) 22.3-6.8 MG/ML ophthalmic solution     . ELIQUIS 5 MG TABS tablet TAKE 1 TABLET BY MOUTH TWICE A DAY 180 tablet 1  . fenofibrate (TRICOR) 145 MG tablet Take 145 mg by mouth at bedtime.   0  . omeprazole-sodium bicarbonate (ZEGERID) 40-1100 MG capsule     . ondansetron (ZOFRAN) 8 MG tablet TAKE ONE TABLET EVERY 6 HOURS AS NEEDED FOR NAUSEA ORALLY 10 DAYS  0  . pantoprazole (PROTONIX) 40 MG tablet Take 40 mg by mouth daily.    . pregabalin (LYRICA) 100 MG capsule Take 1 capsule (100 mg total) by mouth 2 (two) times daily. 60 capsule 5  .  tizanidine (ZANAFLEX) 2 MG capsule TAKE 1 CAPSULE (2 MG TOTAL) BY MOUTH 3 (THREE) TIMES DAILY. 270 capsule 3  . traMADol (ULTRAM) 50 MG tablet      No current facility-administered medications on file prior to visit.    Review of Systems: a complete, 10pt review of systems was completed with pertinent positives and negatives as documented in the HPI  Physical Exam: Alert, cooperative Unlabored respirations Abdomen soft, mildly diffusely tender, nondistended   CBC Latest Ref Rng & Units 03/06/2020 08/12/2018 04/02/2017  WBC 4.0 - 10.5 K/uL 8.2 7.8 8.4  Hemoglobin 12.0 - 15.0 g/dL 16.0(H) 16.8(H) 16.2(H)  Hematocrit 36.0 - 46.0 % 49.6(H) 51.2(H) 47.5(H)  Platelets 150 - 400 K/uL 191 207 255    CMP Latest Ref Rng & Units 03/06/2020 08/12/2018 06/03/2017  Glucose 70 - 99 mg/dL 140(H) 116(H) 123(H)  BUN 8 - 23 mg/dL 11 14 12   Creatinine 0.44 - 1.00 mg/dL 0.86 0.87 0.76  Sodium 135 - 145 mmol/L 135 138 138  Potassium 3.5 - 5.1 mmol/L 3.5 4.3 4.0  Chloride 98 - 111 mmol/L 99 98 97  CO2 22 - 32 mmol/L 27 26 26   Calcium 8.9 - 10.3 mg/dL 10.4(H) 10.8(H) 10.4(H)  Total Protein 6.5 - 8.1 g/dL 6.5 - -  Total Bilirubin 0.3 - 1.2 mg/dL 0.7 - -  Alkaline Phos 38 - 126 U/L 51 - -  AST 15 - 41 U/L 23 - -  ALT 0 - 44 U/L 15 - -    Lab Results  Component Value Date   INR 1.22 04/20/2016   INR 0.97 02/07/2016    Imaging: No results found.   A/P: SYMPTOMATIC CHOLELITHIASIS (K80.20) Story: Symptoms are atypical but certainly could be secondary to her gallstones. Fairly extensive workup without much other etiology identified other than a very small hiatal hernia noted only on upper GI, not upper endoscopy, and esophageal dysmotility. I went over the relevant anatomy and pathology of gallbladder disease with her. We also discussed the technique of laparoscopic or robotic cholecystectomy. Discussed risks of surgery including bleeding, pain, scarring, intraabdominal injury specifically to the common  bile duct and sequelae, conversion to open surgery, failure to resolve symptoms, blood clots/ pulmonary embolus, heart attack, pneumonia, stroke, death. Questions welcomed and answered to patient's satisfaction. I think  it is reasonable to proceed with cholecystectomy in hopes of improving her quality of life. Her biggest concern is that due to her nausea and she has not been able to go to church except for one time in the last 8 months and she really misses her community there. Discussed the possibility that the nausea does not resolve with gallbladder surgery. This point she would like to proceed with surgery. She will need cardiac clearance regarding the enlarged left atrium, her atrial fibrillation, and her anticoagulation. Her cardiologist is Dr. Stanford Breed.    Patient Active Problem List   Diagnosis Date Noted  . Stroke ([place])   . Siriasis   . PONV (postoperative nausea and vomiting)   . Pneumonia   . Peripheral neuropathy   . OP (osteoporosis)   . Neuropathy   . Migraines   . Migraine triggered seizures ([place])   . Legally blind   . Hypertension   . History of diverticulitis   . Glucose intolerance (impaired glucose tolerance)   . Glaucoma   . GERD (gastroesophageal reflux disease)   . Complication of anesthesia   . Blindness of right eye   . Allergy   . GI bleed 04/16/2016  . Symptomatic anemia 04/16/2016  . Hypoxia - on oxygen 02/10/2016  . Diabetes mellitus without complication ([place])   . Obesity (BMI 30-39.9) 02/08/2016  . Lichen sclerosus 25/95/6387  . 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 02/07/2016 02/07/2016  . Chest pain 11/04/2014  . Elevated white blood cell count 11/04/2014  . B12 deficiency 05/20/2013  . Seizure disorder ([place]) 12/07/2012  . Dehydration with hyponatremia 12/07/2012  . Atrial fibrillation ([place]) 12/06/2012  . Paroxysmal atrial fibrillation ([place]) 12/06/2012  . Acute respiratory failure with hypoxia ([place]) 12/06/2012  . Gait  disorder 06/05/2012  . Sjogren's syndrome ([place])   . Hereditary and idiopathic peripheral neuropathy 05/09/2012  . 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 ([place]) 05/09/2012  . History of detached retina repair 02/24/2012  . Primary open angle glaucoma of both eyes, severe stage 02/24/2012  . ANEMIA, B12 DEFICIENCY 07/19/2009  . Anxiety state 01/27/2009  . Sleep apnea 12/19/2008  . MIXED HYPERLIPIDEMIA 07/18/2008  . HYPERTRIGLYCERIDEMIA 08/28/2006  . Migraine headache 08/28/2006  . Essential hypertension 08/28/2006  . GERD 08/28/2006  . PSORIASIS 08/28/2006  . Osteoarthritis 08/28/2006  . OSTEOPOROSIS 08/28/2006       Romana Juniper, MD [facility]  See AMION to contact appropriate on-call provider

## 2020-07-14 NOTE — H&P (View-Only) (Signed)
Surgical Evaluation  Chief Complaint: nausea  HPI: 85 year old woman on Eliquis with multiple medical problems including history of stroke, sleep apnea, Sjogren syndrome, glaucoma, blindness, psoriasis, pneumonia, peripheral neuropathy, paroxysmal atrial fibrillation, arthritis, history of migraines/seizures, hypertension, diverticulitis, GERD, diabetes.  She has had epigastric pain, chronic nausea and reflux for over 4 years. For the last 8 months she has had nearly daily nausea, which is worse in the morning and subsides around 2 or 3 in the afternoon. She has been able to eat well, has not been vomiting. She also notes left and abdominal pain. Denies any known right abdominal pain. She has had a fairly extensive workup as noted below, her primary care doctor is Dr. Harlan Stains and her gastroenterologist is Dr. Cristina Gong. She lives at home with her husband, their daughter also stays with them and is their medical and general caregiver. 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  She had an ultrasound of the right upper quadrant February of this year demonstrating hepatic steatosis and gallstones without evidence of cholecystitis. Common bile duct normal caliber.  She was treated with ursodiol for several months and had some transient improvement in her symptoms with this, but they have since returned.  Upper GI in October of last year demonstrates esophageal dysmotility with poor propagation and occasional tertiary contractions with moderate esophageal stasis, no mass or stricture. A small hiatal hernia was noted, no reflux demonstrated. Incidentally noted a moderate sized duodenal diverticulum. She had an upper endoscopy with Dr. Teena Irani in January 2018 which was normal  CT of the chest one year ago noted markedly left atrial dilatation which was new, and hepatic steatosis. Last echo in the system was March 2019 noting an ejection fraction of 55-60%, moderately dilated left  atrium,  Surgical history includes total abdominal hysterectomy with bilateral salpingo-oophorectomy, robotic sigmoid colectomy by Dr. Marcello Moores in 2017 for colovesical fistula.    Allergies  Allergen Reactions  . Morphine   . Oxycodone-Aspirin Nausea And Vomiting  . Codeine Nausea And Vomiting and Other (See Comments)  . Hydrocodone Nausea And Vomiting  . Morphine And Related Nausea And Vomiting  . Oxycodone Nausea And Vomiting  . Pilocarpine Other (See Comments)    Reaction:  Unknown   . Rivaroxaban Other (See Comments)    Reaction:  Back pain/headaches     Past Medical History:  Diagnosis Date  . Acute respiratory failure with hypoxia ([place]) 12/06/2012  . Allergy   . Blindness of right eye   . Complication of anesthesia   . Dehydration with hyponatremia 12/07/2012  . Diabetes mellitus without complication ([place])    diagnosed 2 weeks ago- No CBG meter as of yet.  Marland Kitchen DIVERTICULITIS, HX OF 01/27/2009   Qualifier: Diagnosis of  By: Jerold Coombe    . Gait disorder 06/05/2012  . GERD (gastroesophageal reflux disease)   . Glaucoma   . Glucose intolerance (impaired glucose tolerance)   . History of diverticulitis   . Hypertension   . Legally blind   . Migraine triggered seizures ([place])    history of  . Migraines   . Neuropathy   . OP (osteoporosis)   . Osteoarthritis    osteoarthritis -right knee. uses walker. Left shoulder -"Limited ROM"  . Paroxysmal atrial fibrillation ([place]) 12/06/2012   Echo (12/07/12): Moderate focal basal hypertrophy of the septum, vigorous LV function, EF 65-70%, indeterminate diastolic function, normal wall motion, trivial MR, moderate LAE, trivial TR;  Xarelto started 11/2012  . Peripheral neuropathy  neuropathy  . Pneumonia   . PONV (postoperative nausea and vomiting)    no problems other past few surgeries  . SHINGLES, HX OF 07/17/2009   Qualifier: Diagnosis of  By: Lowne DO, Yvonne    . Siriasis   . Sjogren's syndrome (HCC)    bilateral vision  .  Sleep apnea    does not wear CPAP  . Stroke (HCC)    residual - rare Intermittent expressive aphasia,    Past Surgical History:  Procedure Laterality Date  . BONE TUMOR EXCISION Right    arm  . CATARACT EXTRACTION    . COLONOSCOPY WITH PROPOFOL N/A 06/09/2015   Procedure: COLONOSCOPY WITH PROPOFOL;  Surgeon: Robert Buccini, MD;  Location: [facility];  Service: Endoscopy;  Laterality: N/A;  . DILATION AND CURETTAGE OF UTERUS    . ESOPHAGOGASTRODUODENOSCOPY N/A 04/17/2016   Procedure: ESOPHAGOGASTRODUODENOSCOPY (EGD);  Surgeon: John Hayes, MD;  Location: [facility];  Service: Endoscopy;  Laterality: N/A;  . ESOPHAGOGASTRODUODENOSCOPY (EGD) WITH PROPOFOL N/A 06/09/2015   Procedure: ESOPHAGOGASTRODUODENOSCOPY (EGD) WITH PROPOFOL;  Surgeon: Robert Buccini, MD;  Location: [facility];  Service: Endoscopy;  Laterality: N/A;  . EYE SURGERY    . FOOT FRACTURE SURGERY Left 2007  . GIVENS CAPSULE STUDY N/A 04/17/2016   Procedure: GIVENS CAPSULE STUDY;  Surgeon: John Hayes, MD;  Location: [facility];  Service: Endoscopy;  Laterality: N/A;  . KNEE ARTHROSCOPY Right   . LUMBAR LAMINECTOMY    . ROBOT ASSISTED LAPAROSCOPIC PARTIAL COLECTOMY  02/07/2016   Procedure: ROBOT ASSISTED LAPAROSCOPIC PARTIAL COLECTOMY;  Surgeon: Alicia Thomas, MD;  Location: [facility];  Service: General;;  . SHOULDER OPEN ROTATOR CUFF REPAIR    . TONSILLECTOMY AND ADENOIDECTOMY    . TOTAL ABDOMINAL HYSTERECTOMY W/ BILATERAL SALPINGOOPHORECTOMY      Family History  Problem Relation Age of Onset  . Stroke Mother   . Migraines Mother   . Stomach cancer Father   . Sjogren's syndrome Father   . Sjogren's syndrome Brother   . Sjogren's syndrome Brother   . Scleroderma Sister        raynaud's scleroderma  . Lymphoma Daughter   . Migraines Daughter   . Heart disease Other        maternal side  . Stroke Other        maternal side, 1st degree female <60    Social History   Socioeconomic History  . Marital status:  Married    Spouse name: jesse  . Number of children: 2  . Years of education: college  . Highest education level: Not on file  Occupational History  . Occupation: retired  Tobacco Use  . Smoking status: Former Smoker    Types: Cigarettes  . Smokeless tobacco: Never Used  . Tobacco comment: quit smoking before age of 32  Vaping Use  . Vaping Use: Never used  Substance and Sexual Activity  . Alcohol use: No    Alcohol/week: 0.0 standard drinks  . Drug use: No  . Sexual activity: Not on file  Other Topics Concern  . Not on file  Social History Narrative   She lives with husband of 61 years.  They have a one story home.  One daughter.    Right handed    Drinks caffeine    Social Determinants of Health   Financial Resource Strain: Not on file  Food Insecurity: Not on file  Transportation Needs: Not on file  Physical Activity: Not on file  Stress: Not on file    Social Connections: Not on file    Current Outpatient Medications on File Prior to Visit  Medication Sig Dispense Refill  . cyclobenzaprine (FLEXERIL) 5 MG tablet TAKE 1 TABLET BY MOUTH AT BEDTIME (Patient not taking: No sig reported) 90 tablet 3  . diltiazem (CARDIZEM CD) 180 MG 24 hr capsule TAKE 1 CAPSULE (180 MG TOTAL) BY MOUTH DAILY. 90 capsule 3  . dorzolamide (TRUSOPT) 2 % ophthalmic solution Place 1 drop into both eyes 2 (two) times daily.    . dorzolamide-timolol (COSOPT) 22.3-6.8 MG/ML ophthalmic solution     . ELIQUIS 5 MG TABS tablet TAKE 1 TABLET BY MOUTH TWICE A DAY 180 tablet 1  . fenofibrate (TRICOR) 145 MG tablet Take 145 mg by mouth at bedtime.   0  . omeprazole-sodium bicarbonate (ZEGERID) 40-1100 MG capsule     . ondansetron (ZOFRAN) 8 MG tablet TAKE ONE TABLET EVERY 6 HOURS AS NEEDED FOR NAUSEA ORALLY 10 DAYS  0  . pantoprazole (PROTONIX) 40 MG tablet Take 40 mg by mouth daily.    . pregabalin (LYRICA) 100 MG capsule Take 1 capsule (100 mg total) by mouth 2 (two) times daily. 60 capsule 5  .  tizanidine (ZANAFLEX) 2 MG capsule TAKE 1 CAPSULE (2 MG TOTAL) BY MOUTH 3 (THREE) TIMES DAILY. 270 capsule 3  . traMADol (ULTRAM) 50 MG tablet      No current facility-administered medications on file prior to visit.    Review of Systems: a complete, 10pt review of systems was completed with pertinent positives and negatives as documented in the HPI  Physical Exam: Alert, cooperative Unlabored respirations Abdomen soft, mildly diffusely tender, nondistended   CBC Latest Ref Rng & Units 03/06/2020 08/12/2018 04/02/2017  WBC 4.0 - 10.5 K/uL 8.2 7.8 8.4  Hemoglobin 12.0 - 15.0 g/dL 16.0(H) 16.8(H) 16.2(H)  Hematocrit 36.0 - 46.0 % 49.6(H) 51.2(H) 47.5(H)  Platelets 150 - 400 K/uL 191 207 255    CMP Latest Ref Rng & Units 03/06/2020 08/12/2018 06/03/2017  Glucose 70 - 99 mg/dL 140(H) 116(H) 123(H)  BUN 8 - 23 mg/dL 11 14 12   Creatinine 0.44 - 1.00 mg/dL 0.86 0.87 0.76  Sodium 135 - 145 mmol/L 135 138 138  Potassium 3.5 - 5.1 mmol/L 3.5 4.3 4.0  Chloride 98 - 111 mmol/L 99 98 97  CO2 22 - 32 mmol/L 27 26 26   Calcium 8.9 - 10.3 mg/dL 10.4(H) 10.8(H) 10.4(H)  Total Protein 6.5 - 8.1 g/dL 6.5 - -  Total Bilirubin 0.3 - 1.2 mg/dL 0.7 - -  Alkaline Phos 38 - 126 U/L 51 - -  AST 15 - 41 U/L 23 - -  ALT 0 - 44 U/L 15 - -    Lab Results  Component Value Date   INR 1.22 04/20/2016   INR 0.97 02/07/2016    Imaging: No results found.   A/P: SYMPTOMATIC CHOLELITHIASIS (K80.20) Story: Symptoms are atypical but certainly could be secondary to her gallstones. Fairly extensive workup without much other etiology identified other than a very small hiatal hernia noted only on upper GI, not upper endoscopy, and esophageal dysmotility. I went over the relevant anatomy and pathology of gallbladder disease with her. We also discussed the technique of laparoscopic or robotic cholecystectomy. Discussed risks of surgery including bleeding, pain, scarring, intraabdominal injury specifically to the common  bile duct and sequelae, conversion to open surgery, failure to resolve symptoms, blood clots/ pulmonary embolus, heart attack, pneumonia, stroke, death. Questions welcomed and answered to patient's satisfaction. I think  it is reasonable to proceed with cholecystectomy in hopes of improving her quality of life. Her biggest concern is that due to her nausea and she has not been able to go to church except for one time in the last 8 months and she really misses her community there. Discussed the possibility that the nausea does not resolve with gallbladder surgery. This point she would like to proceed with surgery. She will need cardiac clearance regarding the enlarged left atrium, her atrial fibrillation, and her anticoagulation. Her cardiologist is Dr. Stanford Breed.    Patient Active Problem List   Diagnosis Date Noted  . Stroke ([place])   . Siriasis   . PONV (postoperative nausea and vomiting)   . Pneumonia   . Peripheral neuropathy   . OP (osteoporosis)   . Neuropathy   . Migraines   . Migraine triggered seizures ([place])   . Legally blind   . Hypertension   . History of diverticulitis   . Glucose intolerance (impaired glucose tolerance)   . Glaucoma   . GERD (gastroesophageal reflux disease)   . Complication of anesthesia   . Blindness of right eye   . Allergy   . GI bleed 04/16/2016  . Symptomatic anemia 04/16/2016  . Hypoxia - on oxygen 02/10/2016  . Diabetes mellitus without complication ([place])   . Obesity (BMI 30-39.9) 02/08/2016  . Lichen sclerosus 25/95/6387  . 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 02/07/2016 02/07/2016  . Chest pain 11/04/2014  . Elevated white blood cell count 11/04/2014  . B12 deficiency 05/20/2013  . Seizure disorder ([place]) 12/07/2012  . Dehydration with hyponatremia 12/07/2012  . Atrial fibrillation ([place]) 12/06/2012  . Paroxysmal atrial fibrillation ([place]) 12/06/2012  . Acute respiratory failure with hypoxia ([place]) 12/06/2012  . Gait  disorder 06/05/2012  . Sjogren's syndrome ([place])   . Hereditary and idiopathic peripheral neuropathy 05/09/2012  . 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 ([place]) 05/09/2012  . History of detached retina repair 02/24/2012  . Primary open angle glaucoma of both eyes, severe stage 02/24/2012  . ANEMIA, B12 DEFICIENCY 07/19/2009  . Anxiety state 01/27/2009  . Sleep apnea 12/19/2008  . MIXED HYPERLIPIDEMIA 07/18/2008  . HYPERTRIGLYCERIDEMIA 08/28/2006  . Migraine headache 08/28/2006  . Essential hypertension 08/28/2006  . GERD 08/28/2006  . PSORIASIS 08/28/2006  . Osteoarthritis 08/28/2006  . OSTEOPOROSIS 08/28/2006       Romana Juniper, MD [facility]  See AMION to contact appropriate on-call provider

## 2020-07-17 DIAGNOSIS — Z23 Encounter for immunization: Secondary | ICD-10-CM | POA: Diagnosis not present

## 2020-07-19 ENCOUNTER — Ambulatory Visit (INDEPENDENT_AMBULATORY_CARE_PROVIDER_SITE_OTHER): Payer: Medicare Other | Admitting: Cardiology

## 2020-07-19 ENCOUNTER — Other Ambulatory Visit: Payer: Self-pay

## 2020-07-19 ENCOUNTER — Encounter: Payer: Self-pay | Admitting: Cardiology

## 2020-07-19 VITALS — BP 134/83 | HR 69 | Resp 95 | Ht 60.0 in | Wt 164.1 lb

## 2020-07-19 DIAGNOSIS — I5032 Chronic diastolic (congestive) heart failure: Secondary | ICD-10-CM | POA: Diagnosis not present

## 2020-07-19 DIAGNOSIS — I10 Essential (primary) hypertension: Secondary | ICD-10-CM | POA: Diagnosis not present

## 2020-07-19 DIAGNOSIS — I4821 Permanent atrial fibrillation: Secondary | ICD-10-CM

## 2020-07-19 DIAGNOSIS — E782 Mixed hyperlipidemia: Secondary | ICD-10-CM | POA: Diagnosis not present

## 2020-07-19 DIAGNOSIS — Z01810 Encounter for preprocedural cardiovascular examination: Secondary | ICD-10-CM | POA: Diagnosis not present

## 2020-07-19 NOTE — Progress Notes (Signed)
HPI: FU atrial fibrillation. She has a hx of Sjogren's syndrome, seizure disorder, HTN, HL, peripheral neuropathy. She was admitted 9/14 after presenting with overall functional decline, cough and generalized weakness and malaise. She was found to be in atrial fibrillation with RVR. CHADS2-VASc=4. She was placed on Xarelto for anticoagulation. She converted to sinus rhythm. MRI in October of 2014 showed a very small acute/subacute left thalamic infarct. Patient found to be in recurrent atrial fibrillation 8/16. Nuclear study 8/16 showed EF 65 and no ischemia. Patient declined cardioversion previously and her amiodarone was discontinued. Holter monitor February 2017 showed atrial fibrillation with PVCs or aberrantly conducted beats rate controlled. Showed 1-39% bilateral stenosis. Echocardiogram March 2019 showednormal LV function, mild mitral regurgitation, moderate left atrial enlargement, mild right ventricular enlargement. Since last seen,she has limited functional capacity but denies dyspnea, chest pain, palpitations or syncope.  She has fallen previously but now uses a walker without falling.  She denies bleeding.  Current Outpatient Medications  Medication Sig Dispense Refill  . diltiazem (CARDIZEM CD) 180 MG 24 hr capsule TAKE 1 CAPSULE (180 MG TOTAL) BY MOUTH DAILY. 90 capsule 3  . dorzolamide (TRUSOPT) 2 % ophthalmic solution Place 1 drop into both eyes 2 (two) times daily.    . dorzolamide-timolol (COSOPT) 22.3-6.8 MG/ML ophthalmic solution     . ELIQUIS 5 MG TABS tablet TAKE 1 TABLET BY MOUTH TWICE A DAY 180 tablet 1  . fenofibrate (TRICOR) 145 MG tablet Take 145 mg by mouth at bedtime.   0  . ondansetron (ZOFRAN) 8 MG tablet TAKE ONE TABLET EVERY 6 HOURS AS NEEDED FOR NAUSEA ORALLY 10 DAYS  0  . pantoprazole (PROTONIX) 40 MG tablet Take 40 mg by mouth daily.    . pregabalin (LYRICA) 100 MG capsule Take 1 capsule (100 mg total) by mouth 2 (two) times daily. 60 capsule 5   No  current facility-administered medications for this visit.     Past Medical History:  Diagnosis Date  . Acute respiratory failure with hypoxia ([place]) 12/06/2012  . Allergy   . Blindness of right eye   . Complication of anesthesia   . Dehydration with hyponatremia 12/07/2012  . Diabetes mellitus without complication ([place])    diagnosed 2 weeks ago- No CBG meter as of yet.  Marland Kitchen DIVERTICULITIS, HX OF 01/27/2009   Qualifier: Diagnosis of  By: Jerold Coombe    . Gait disorder 06/05/2012  . GERD (gastroesophageal reflux disease)   . Glaucoma   . Glucose intolerance (impaired glucose tolerance)   . History of diverticulitis   . Hypertension   . Legally blind   . Migraine triggered seizures ([place])    history of  . Migraines   . Neuropathy   . OP (osteoporosis)   . Osteoarthritis    osteoarthritis -right knee. uses walker. Left shoulder -"Limited ROM"  . Paroxysmal atrial fibrillation ([place]) 12/06/2012   Echo (12/07/12): Moderate focal basal hypertrophy of the septum, vigorous LV function, EF 65-70%, indeterminate diastolic function, normal wall motion, trivial MR, moderate LAE, trivial TR;  Xarelto started 11/2012  . Peripheral neuropathy    neuropathy  . Pneumonia   . PONV (postoperative nausea and vomiting)    no problems other past few surgeries  . SHINGLES, HX OF 07/17/2009   Qualifier: Diagnosis of  By: Jerold Coombe    . Siriasis   . Sjogren's syndrome ([place])    bilateral vision  . Sleep apnea    does not wear  CPAP  . Stroke ([place])    residual - rare Intermittent expressive aphasia,    Past Surgical History:  Procedure Laterality Date  . BONE TUMOR EXCISION Right    arm  . CATARACT EXTRACTION    . COLONOSCOPY WITH PROPOFOL N/A 06/09/2015   Procedure: COLONOSCOPY WITH PROPOFOL;  Surgeon: Ronald Lobo, MD;  Location: [place];  Service: Endoscopy;  Laterality: N/A;  . DILATION AND CURETTAGE OF UTERUS    . ESOPHAGOGASTRODUODENOSCOPY N/A 04/17/2016   Procedure:  ESOPHAGOGASTRODUODENOSCOPY (EGD);  Surgeon: Teena Irani, MD;  Location[facility]COPY;  Service: Endoscopy;  Laterality: N/A;  . ESOPHAGOGASTRODUODENOSCOPY (EGD) WITH PROPOFOL N/A 06/09/2015   Procedure: ESOPHAGOGASTRODUODENOSCOPY (EGD) WITH PROPOFOL;  Surgeon: Ronald Lobo, MD;  Location: [facility] ENDOSCOPY;  Service: Endoscopy;  Laterality: N/A;  . EYE SURGERY    . FOOT FRACTURE SURGERY Left 2007  . GIVENS CAPSULE STUDY N/A 04/17/2016   Procedure: GIVENS CAPSULE STUDY;  Surgeon: Teena Irani, MD;  Location: [place];  Service: Endoscopy;  Laterality: N/A;  . KNEE ARTHROSCOPY Right   . LUMBAR LAMINECTOMY    . ROBOT ASSISTED LAPAROSCOPIC PARTIAL COLECTOMY  02/07/2016   Procedure: ROBOT ASSISTED LAPAROSCOPIC PARTIAL COLECTOMY;  Surgeon: Leighton Ruff, MD;  Location: [facility];  Service: General;;  . SHOULDER OPEN ROTATOR CUFF REPAIR    . TONSILLECTOMY AND ADENOIDECTOMY    . TOTAL ABDOMINAL HYSTERECTOMY W/ BILATERAL SALPINGOOPHORECTOMY      Social History   Socioeconomic History  . Marital status: Married    Spouse name: jesse  . Number of children: 2  . Years of education: college  . Highest education level: Not on file  Occupational History  . Occupation: retired  Tobacco Use  . Smoking status: Former Smoker    Types: Cigarettes  . Smokeless tobacco: Never Used  . Tobacco comment: quit smoking before age of 89  Vaping Use  . Vaping Use: Never used  Substance and Sexual Activity  . Alcohol use: No    Alcohol/week: 0.0 standard drinks  . Drug use: No  . Sexual activity: Not on file  Other Topics Concern  . Not on file  Social History Narrative   She lives with husband of 45 years.  They have a one story home.  One daughter.    Right handed    Drinks caffeine    Social Determinants of Health   Financial Resource Strain: Not on file  Food Insecurity: Not on file  Transportation Needs: Not on file  Physical Activity: Not on file  Stress: Not on file  Social Connections: Not on  file  Intimate Partner Violence: Not on file    Family History  Problem Relation Age of Onset  . Stroke Mother   . Migraines Mother   . Stomach cancer Father   . Sjogren's syndrome Father   . Sjogren's syndrome Brother   . Sjogren's syndrome Brother   . Scleroderma Sister        raynaud's scleroderma  . Lymphoma Daughter   . Migraines Daughter   . Heart disease Other        maternal side  . Stroke Other        maternal side, 1st degree female <60    ROS: no fevers or chills, productive cough, hemoptysis, dysphasia, odynophagia, melena, hematochezia, dysuria, hematuria, rash, seizure activity, orthopnea, PND, pedal edema, claudication. Remaining systems are negative.  Physical Exam: Well-developed well-nourished in no acute distress.  Skin is warm and dry.  HEENT is normal.  Neck is  supple.  Chest is clear to auscultation with normal expansion.  Cardiovascular exam is irregular Abdominal exam nontender or distended. No masses palpated. Extremities show no edema. neuro grossly intact  ECG-atrial fibrillation, cannot rule out septal infarct, nonspecific ST changes.  Personally reviewed  A/P  1 preoperative evaluation prior to laparoscopic cholecystectomy-patient has limited functional capacity but denies dyspnea or chest pain. Most recent nuclear study showed no ischemia. Laparoscopic cholecystectomy would not be considered high risk.  She may proceed without further ischemia evaluation. We will hold apixaban 2 days prior to cholecystectomy and resume afterwards when hemostasis achieved and okay with general surgery.  2 permanent atrial fibrillation-plan to continue Cardizem for rate control. Continue apixaban.  3 hypertension-blood pressure controlled. Continue present medications and follow.  4 chronic diastolic congestive heart failure-she is euvolemic on examination.  5 hyperlipidemia-followed by primary care.  Kirk Ruths, MD

## 2020-07-19 NOTE — Patient Instructions (Signed)

## 2020-07-24 DIAGNOSIS — E785 Hyperlipidemia, unspecified: Secondary | ICD-10-CM | POA: Diagnosis not present

## 2020-07-24 DIAGNOSIS — E1143 Type 2 diabetes mellitus with diabetic autonomic (poly)neuropathy: Secondary | ICD-10-CM | POA: Diagnosis not present

## 2020-07-24 DIAGNOSIS — I480 Paroxysmal atrial fibrillation: Secondary | ICD-10-CM | POA: Diagnosis not present

## 2020-07-24 DIAGNOSIS — M1711 Unilateral primary osteoarthritis, right knee: Secondary | ICD-10-CM | POA: Diagnosis not present

## 2020-07-24 DIAGNOSIS — M47816 Spondylosis without myelopathy or radiculopathy, lumbar region: Secondary | ICD-10-CM | POA: Diagnosis not present

## 2020-07-24 DIAGNOSIS — K219 Gastro-esophageal reflux disease without esophagitis: Secondary | ICD-10-CM | POA: Diagnosis not present

## 2020-07-24 DIAGNOSIS — D582 Other hemoglobinopathies: Secondary | ICD-10-CM | POA: Diagnosis not present

## 2020-07-24 DIAGNOSIS — G4700 Insomnia, unspecified: Secondary | ICD-10-CM | POA: Diagnosis not present

## 2020-07-27 NOTE — Patient Instructions (Signed)
DUE TO COVID-19 ONLY ONE VISITOR IS ALLOWED TO COME WITH YOU AND STAY IN THE WAITING ROOM ONLY DURING PRE OP AND PROCEDURE DAY OF SURGERY. THE 1 VISITOR  MAY VISIT WITH YOU AFTER SURGERY IN YOUR PRIVATE ROOM DURING VISITING HOURS ONLY!  YOU NEED TO HAVE A COVID 19 TEST ON: 07/28/20, THIS TEST MUST BE DONE BEFORE SURGERY,  COVID TESTING SITE [address] [place] [address], IT IS ON THE RIGHT GOING OUT [address] APPROXIMATELY  2 MINUTES PAST [employer] ON THE RIGHT. ONCE YOUR COVID TEST IS COMPLETED,  PLEASE BEGIN THE QUARANTINE INSTRUCTIONS AS OUTLINED IN YOUR HANDOUT.                Krystal Clarke   Your procedure is scheduled on: 08/01/20   Report to [facility] Main  Entrance   Report to admitting at: 8:00 AM     Call this number if you have problems the morning of surgery [phone]    Remember: Do not eat food or drink liquids :After Midnight.   BRUSH YOUR TEETH MORNING OF SURGERY AND RINSE YOUR MOUTH OUT, NO CHEWING GUM CANDY OR MINTS.    Take these medicines the morning of surgery with A SIP OF WATER: pregabalin,diltiazem,esomeprazole.                               You may not have any metal on your body including hair pins and              piercings  Do not wear jewelry, make-up, lotions, powders or perfumes, deodorant             Do not wear nail polish on your fingernails.  Do not shave  48 hours prior to surgery.    Do not bring valuables to the hospital. [place].  Contacts, dentures or bridgework may not be worn into surgery.  Leave suitcase in the car. After surgery it may be brought to your room.     Patients discharged the day of surgery will not be allowed to drive home. IF YOU ARE HAVING SURGERY AND GOING HOME THE SAME DAY, YOU MUST HAVE AN ADULT TO DRIVE YOU HOME AND BE WITH YOU FOR 24 HOURS. YOU MAY GO HOME BY TAXI OR UBER OR ORTHERWISE, BUT AN ADULT MUST ACCOMPANY YOU HOME  AND STAY WITH YOU FOR 24 HOURS.  Name and phone number of your driver:  Special Instructions: N/A              Please read over the following fact sheets you were given: _____________________________________________________________________         [facility] - Preparing for Surgery Before surgery, you can play an important role.  Because skin is not sterile, your skin needs to be as free of germs as possible.  You can reduce the number of germs on your skin by washing with CHG (chlorahexidine gluconate) soap before surgery.  CHG is an antiseptic cleaner which kills germs and bonds with the skin to continue killing germs even after washing. Please DO NOT use if you have an allergy to CHG or antibacterial soaps.  If your skin becomes reddened/irritated stop using the CHG and inform your nurse when you arrive at Short Stay. Do not shave (including legs and underarms)  for at least 48 hours prior to the first CHG shower.  You may shave your face/neck. Please follow these instructions carefully:  1.  Shower with CHG Soap the night before surgery and the  morning of Surgery.  2.  If you choose to wash your hair, wash your hair first as usual with your  normal  shampoo.  3.  After you shampoo, rinse your hair and body thoroughly to remove the  shampoo.                           4.  Use CHG as you would any other liquid soap.  You can apply chg directly  to the skin and wash                       Gently with a scrungie or clean washcloth.  5.  Apply the CHG Soap to your body ONLY FROM THE NECK DOWN.   Do not use on face/ open                           Wound or open sores. Avoid contact with eyes, ears mouth and genitals (private parts).                       Wash face,  Genitals (private parts) with your normal soap.             6.  Wash thoroughly, paying special attention to the area where your surgery  will be performed.  7.  Thoroughly rinse your body with warm water from the neck down.  8.  DO NOT  shower/wash with your normal soap after using and rinsing off  the CHG Soap.                9.  Pat yourself dry with a clean towel.            10.  Wear clean pajamas.            11.  Place clean sheets on your bed the night of your first shower and do not  sleep with pets. Day of Surgery : Do not apply any lotions/deodorants the morning of surgery.  Please wear clean clothes to the hospital/surgery center.  FAILURE TO FOLLOW THESE INSTRUCTIONS MAY RESULT IN THE CANCELLATION OF YOUR SURGERY PATIENT SIGNATURE_________________________________  NURSE SIGNATURE__________________________________  ________________________________________________________________________

## 2020-07-28 ENCOUNTER — Other Ambulatory Visit: Payer: Self-pay

## 2020-07-28 ENCOUNTER — Encounter (HOSPITAL_COMMUNITY): Payer: Self-pay

## 2020-07-28 ENCOUNTER — Encounter (HOSPITAL_COMMUNITY)
Admission: RE | Admit: 2020-07-28 | Discharge: 2020-07-28 | Disposition: A | Discharge status: Home / Self Care | Payer: Medicare Other | Source: Ambulatory Visit | Attending: Surgery | Admitting: Surgery

## 2020-07-28 ENCOUNTER — Other Ambulatory Visit (HOSPITAL_COMMUNITY)
Admission: RE | Admit: 2020-07-28 | Discharge: 2020-07-28 | Disposition: A | Discharge status: Home / Self Care | Payer: Medicare Other | Source: Ambulatory Visit | Attending: Surgery | Admitting: Surgery

## 2020-07-28 DIAGNOSIS — I69320 Aphasia following cerebral infarction: Secondary | ICD-10-CM | POA: Diagnosis not present

## 2020-07-28 DIAGNOSIS — Z01812 Encounter for preprocedural laboratory examination: Secondary | ICD-10-CM | POA: Insufficient documentation

## 2020-07-28 DIAGNOSIS — Z20822 Contact with and (suspected) exposure to covid-19: Secondary | ICD-10-CM | POA: Insufficient documentation

## 2020-07-28 DIAGNOSIS — Z7901 Long term (current) use of anticoagulants: Secondary | ICD-10-CM | POA: Insufficient documentation

## 2020-07-28 DIAGNOSIS — G4733 Obstructive sleep apnea (adult) (pediatric): Secondary | ICD-10-CM | POA: Diagnosis not present

## 2020-07-28 DIAGNOSIS — I4821 Permanent atrial fibrillation: Secondary | ICD-10-CM | POA: Diagnosis not present

## 2020-07-28 DIAGNOSIS — I5032 Chronic diastolic (congestive) heart failure: Secondary | ICD-10-CM | POA: Insufficient documentation

## 2020-07-28 DIAGNOSIS — H548 Legal blindness, as defined in USA: Secondary | ICD-10-CM | POA: Diagnosis not present

## 2020-07-28 DIAGNOSIS — I110 Hypertensive heart disease with heart failure: Secondary | ICD-10-CM | POA: Diagnosis not present

## 2020-07-28 DIAGNOSIS — Z79899 Other long term (current) drug therapy: Secondary | ICD-10-CM | POA: Diagnosis not present

## 2020-07-28 DIAGNOSIS — K8050 Calculus of bile duct without cholangitis or cholecystitis without obstruction: Secondary | ICD-10-CM | POA: Insufficient documentation

## 2020-07-28 HISTORY — DX: Cardiac arrhythmia, unspecified: I49.9

## 2020-07-28 LAB — CBC
HCT: 51 % — ABNORMAL HIGH (ref 36.0–46.0)
Hemoglobin: 16.4 g/dL — ABNORMAL HIGH (ref 12.0–15.0)
MCH: 30.4 pg (ref 26.0–34.0)
MCHC: 32.2 g/dL (ref 30.0–36.0)
MCV: 94.4 fL (ref 80.0–100.0)
Platelets: 171 10*3/uL (ref 150–400)
RBC: 5.4 MIL/uL — ABNORMAL HIGH (ref 3.87–5.11)
RDW: 14.1 % (ref 11.5–15.5)
WBC: 7.6 10*3/uL (ref 4.0–10.5)
nRBC: 0 % (ref 0.0–0.2)

## 2020-07-28 LAB — BASIC METABOLIC PANEL
Anion gap: 6 (ref 5–15)
BUN: 16 mg/dL (ref 8–23)
CO2: 27 mmol/L (ref 22–32)
Calcium: 10.3 mg/dL (ref 8.9–10.3)
Chloride: 103 mmol/L (ref 98–111)
Creatinine, Ser: 0.61 mg/dL (ref 0.44–1.00)
GFR, Estimated: >60 mL/min (ref >60)
Glucose, Bld: 116 mg/dL — ABNORMAL HIGH (ref 70–99)
Potassium: 4.1 mmol/L (ref 3.5–5.1)
Sodium: 136 mmol/L (ref 135–145)

## 2020-07-28 LAB — HEMOGLOBIN A1C
Hgb A1c MFr Bld: 5.6 % (ref 4.8–5.6)
Mean Plasma Glucose: 114.02 mg/dL

## 2020-07-28 LAB — GLUCOSE, CAPILLARY: Glucose-Capillary: 143 mg/dL — ABNORMAL HIGH (ref 70–99)

## 2020-07-28 NOTE — Progress Notes (Addendum)
CBC done 07/28/20 routed to DR Romana Juniper. Called Konrad Felix, [facility] and made her aware of CBC results from 07/28/20 and also sent secure chat to [facility]  .  Chart on shelf in her office.

## 2020-07-28 NOTE — Progress Notes (Signed)
COVID Vaccine Completed: Yes Date COVID Vaccine completed: 07/2020 [place] COVID vaccine manufacturer:     Moderna     PCP - Dr. Harlan Stains Cardiologist - Dr. Kirk Ruths LOV: 07/19/20: Clearance.: EPIC  Chest x-ray -  EKG - 07/19/20 Stress Test -  ECHO - 06/11/20 Cardiac Cath -  Pacemaker/ICD device last checked:  Sleep Study - yes CPAP - no  Fasting Blood Sugar - N/A Checks Blood Sugar _____ times a day  Blood Thinner Instructions: Eliquis will be held two days before surgery as per Dr. Jacalyn Lefevre instructions. Aspirin Instructions: Last Dose:  Anesthesia review: Hx: Afib,ARF,DIA,HTN,OSA(No CPAP),Stroke.  Patient denies shortness of breath, fever, cough and chest pain at PAT appointment   Patient verbalized understanding of instructions that were given to them at the PAT appointment. Patient was also instructed that they will need to review over the PAT instructions again at home before surgery.

## 2020-07-29 LAB — SARS CORONAVIRUS 2 (TAT 6-24 HRS): SARS Coronavirus 2: NEGATIVE

## 2020-07-31 MED ORDER — BUPIVACAINE LIPOSOME 1.3 % IJ SUSP
20.0000 mL | INTRAMUSCULAR | Status: DC
  Filled 2020-07-31: qty 20

## 2020-07-31 NOTE — Anesthesia Preprocedure Evaluation (Addendum)
Anesthesia Evaluation  Patient identified by MRN, date of birth, ID band Patient awake    Reviewed: Allergy & Precautions, NPO status , Patient's Chart, lab work & pertinent test results  History of Anesthesia Complications (+) PONV  Airway Mallampati: II  TM Distance: >3 FB Neck ROM: Full    Dental no notable dental hx. (+) Edentulous Upper, Edentulous Lower   Pulmonary sleep apnea , former smoker,    Pulmonary exam normal breath sounds clear to auscultation       Cardiovascular hypertension, Normal cardiovascular exam+ dysrhythmias Atrial Fibrillation  Rhythm:Irregular Rate:Abnormal  05/2017 echo  - Left ventricle: The cavity size was normal. There was severe  focal basal hypertrophy of the septum measuring 1.8 cm. No LVOT  obstruction is noted. Systolic function was normal. The estimated  ejection fraction was in the range of 55% to 60%. Images were  inadequate for LV wall motion assessment. The study is not  technically sufficient to allow evaluation of LV diastolic  function.  - Aortic valve: Trileaflet. Sclerosis without stenosis. There was  no regurgitation.  - Mitral valve: Mildly thickened leaflets . There was mild  regurgitation.  - Left atrium: Moderately dilated.  - Right ventricle: The cavity size was mildly dilated. Mildly  reduced systolic function.  - Tricuspid valve: There was mild regurgitation.  - Pulmonary arteries: PA peak pressure: 24 mm Hg (S).  - Inferior vena cava: The vessel was normal in size. The  respirophasic diameter changes were in the normal range (>= 50%),  consistent with normal central venous pressure.    Neuro/Psych Seizures -,  CVA    GI/Hepatic Neg liver ROS, GERD  ,  Endo/Other  diabetes  Renal/GU negative Renal ROSLab Results      Component                Value               Date                      CREATININE               0.61                07/28/2020                 BUN                      16                  07/28/2020                NA                       136                 07/28/2020                K                        4.1                 07/28/2020                CL                       103  07/28/2020                CO2                      27                  07/28/2020                Musculoskeletal   Abdominal (+) + obese,   Peds  Hematology negative hematology ROS (+) Lab Results      Component                Value               Date                      WBC                      7.6                 07/28/2020                HGB                      16.4 (H)            07/28/2020                HCT                      51.0 (H)            07/28/2020                MCV                      94.4                07/28/2020                PLT                      171                 07/28/2020              Anesthesia Other Findings PT blind All to ASA codeine, See list  Reproductive/Obstetrics                           Anesthesia Physical Anesthesia Plan  ASA: III  Anesthesia Plan: General   Post-op Pain Management:    Induction: Intravenous  PONV Risk Score and Plan: 4 or greater and Treatment may vary due to age or medical condition, TIVA, Dexamethasone and Ondansetron  Airway Management Planned: Oral ETT  Additional Equipment: None  Intra-op Plan:   Post-operative Plan: Extubation in OR  Informed Consent: I have reviewed the patients History and Physical, chart, labs and discussed the procedure including the risks, benefits and alternatives for the proposed anesthesia with the patient or authorized representative who has indicated his/her understanding and acceptance.     Dental advisory given  Plan Discussed with:   Anesthesia Plan Comments: (See APP note by Durel Salts, FNP   TIVA GA)      Anesthesia Quick Evaluation

## 2020-07-31 NOTE — Progress Notes (Signed)
Anesthesia Chart Review:   Case: [id number] Date/Time: 08/01/20 0945   Procedure: LAPAROSCOPIC CHOLECYSTECTOMY (N/A )   Anesthesia type: General   Pre-op diagnosis: BILIARY COLIC   [facility] RO[facility]   Surgeons: Clovis Riley, MD      DISCUSSION: Pt is 85 years old with hx permanent atrial fibrillation, HTN, chronic diastolic HF, DM, stroke, seizure disorder, Sjogren's syndrome, OSA, legally blind + visual hallucinations likely due to progressive vision loss (as seen with Sherran Needs syndrome)  Pt to hold apixaban 2 days before surgery   VS: BP (!) 138/92   Pulse 77   Temp 37.1 C (Oral)   Ht 5' (1.524 m)   SpO2 98%   BMI 32.05 kg/m   PROVIDERS: - PCP is Harlan Stains, MD  - Cardiologist is Kirk Ruths, MD who cleared pt for surgery at last office visit 07/19/20 - Neurologist is Narda Amber, DO who follows pt for migraines, peripheral neuropathy due to Sjogren's, hx stroke, and gait instability. Last office visit 05/19/20   LABS: Labs reviewed: Acceptable for surgery. (all labs ordered are listed, but only abnormal results are displayed)  Labs Reviewed  CBC - Abnormal; Notable for the following components:      Result Value   RBC 5.40 (*)    Hemoglobin 16.4 (*)    HCT 51.0 (*)    All other components within normal limits  BASIC METABOLIC PANEL - Abnormal; Notable for the following components:   Glucose, Bld 116 (*)    All other components within normal limits  GLUCOSE, CAPILLARY - Abnormal; Notable for the following components:   Glucose-Capillary 143 (*)    All other components within normal limits  HEMOGLOBIN A1C     IMAGES: MR brain 06/09/20:  - Negative for acute infarct - Progressive atrophy. Progressive chronic microvascular ischemic changes. New areas of chronic microhemorrhage right temporal and right parietal lobe. Correlate with hypertension control.  [place] abd 04/27/20:  1. Cholelithiasis without evidence of acute cholecystitis. 2.  Hepatic steatosis.   EKG 07/19/20: atrial fibrillation. Septal infarct, age undetermined.    CV: Echo 06/11/17:  - Left ventricle: The cavity size was normal. There was severe focal basal hypertrophy of the septum measuring 1.8 cm. No LVOT obstruction is noted. Systolic function was normal. The estimated ejection fraction was in the range of 55% to 60%. Images were inadequate for LV wall motion assessment. The study is not technically sufficient to allow evaluation of LV diastolic function.  - Aortic valve: Trileaflet. Sclerosis without stenosis. There was no regurgitation.  - Mitral valve: Mildly thickened leaflets . There was mild regurgitation.  - Left atrium: Moderately dilated.  - Right ventricle: The cavity size was mildly dilated. Mildly reduced systolic function.  - Tricuspid valve: There was mild regurgitation.  - Pulmonary arteries: PA peak pressure: 24 mm Hg (S).  - Inferior vena cava: The vessel was normal in size. The respirophasic diameter changes were in the normal range (>= 50%), consistent with normal central venous pressure.  - Impressions: Compared to a prior study in 2016, the LVEF is unchanged. Thereis very focal basal septal hypertrophy, measuring 1.8 cm, suggestive of HCM - no LVOT obstruction is noted at present.    Carotid duplex 05/11/15:  - Heterogeneous plaque, bilaterally. - 1-39% bilateral ICA stenosis. - Normal subclavian arteries, bilaterally. - Patent vertebral arteries with antegrade flow   Holter monitor 05/03/15: afib with pvcs or aberrantly conducted beats, rate controlled   Nuclear stress test  11/16/14:   Nuclear stress EF: 65%.  The study is normal. no evidence of ischemia. Normal LV function  This is a low risk study.  The left ventricular ejection fraction is normal (55-65%).    Past Medical History:  Diagnosis Date  . Acute respiratory failure with hypoxia ([place]) 12/06/2012  . Allergy   . Blindness of right eye   .  Complication of anesthesia   . Dehydration with hyponatremia 12/07/2012  . Diabetes mellitus without complication ([place])    diagnosed 2 weeks ago- No CBG meter as of yet.  Marland Kitchen DIVERTICULITIS, HX OF 01/27/2009   Qualifier: Diagnosis of  By: Jerold Coombe    . Dysrhythmia    Afib  . Gait disorder 06/05/2012  . GERD (gastroesophageal reflux disease)   . Glaucoma   . Glucose intolerance (impaired glucose tolerance)   . History of diverticulitis   . Hypertension   . Legally blind   . Migraine triggered seizures ([place])    history of  . Migraines   . Neuropathy   . OP (osteoporosis)   . Osteoarthritis    osteoarthritis -right knee. uses walker. Left shoulder -"Limited ROM"  . Paroxysmal atrial fibrillation ([place]) 12/06/2012   Echo (12/07/12): Moderate focal basal hypertrophy of the septum, vigorous LV function, EF 65-70%, indeterminate diastolic function, normal wall motion, trivial MR, moderate LAE, trivial TR;  Xarelto started 11/2012  . Peripheral neuropathy    neuropathy  . Pneumonia   . PONV (postoperative nausea and vomiting)    no problems other past few surgeries  . SHINGLES, HX OF 07/17/2009   Qualifier: Diagnosis of  By: Jerold Coombe    . Siriasis   . Sjogren's syndrome ([place])    bilateral vision  . Sleep apnea    does not wear CPAP  . Stroke [facility])    residual - rare Intermittent expressive aphasia,    Past Surgical History:  Procedure Laterality Date  . BONE TUMOR EXCISION Right    arm  . CATARACT EXTRACTION    . COLONOSCOPY WITH PROPOFOL N/A 06/09/2015   Procedure: COLONOSCOPY WITH PROPOFOL;  Surgeon: Ronald Lobo, MD;  Location: [facility];  Service: Endoscopy;  Laterality: N/A;  . DILATION AND CURETTAGE OF UTERUS    . ESOPHAGOGASTRODUODENOSCOPY N/A 04/17/2016   Procedure: ESOPHAGOGASTRODUODENOSCOPY (EGD);  Surgeon: Teena Irani, MD;  Location: [facility] ENDOSCOPY;  Service: Endoscopy;  Laterality: N/A;  . ESOPHAGOGASTRODUODENOSCOPY (EGD) WITH PROPOFOL N/A 06/09/2015    Procedure: ESOPHAGOGASTRODUODENOSCOPY (EGD) WITH PROPOFOL;  Surgeon: Ronald Lobo, MD;  Location: [facility] ENDOSCOPY;  Service: Endoscopy;  Laterality: N/A;  . EYE SURGERY    . FOOT FRACTURE SURGERY Left 2007  . GIVENS CAPSULE STUDY N/A 04/17/2016   Procedure: GIVENS CAPSULE STUDY;  Surgeon: Teena Irani, MD;  Location: [facility];  Service: Endoscopy;  Laterality: N/A;  . KNEE ARTHROSCOPY Right   . LUMBAR LAMINECTOMY    . ROBOT ASSISTED LAPAROSCOPIC PARTIAL COLECTOMY  02/07/2016   Procedure: ROBOT ASSISTED LAPAROSCOPIC PARTIAL COLECTOMY;  Surgeon: Leighton Ruff, MD;  [facility];  Service: General;;  . SHOULDER OPEN ROTATOR CUFF REPAIR    . TONSILLECTOMY AND ADENOIDECTOMY    . TOTAL ABDOMINAL HYSTERECTOMY W/ BILATERAL SALPINGOOPHORECTOMY      MEDICATIONS: . acetaminophen (TYLENOL) 325 MG tablet  . diltiazem (CARDIZEM CD) 180 MG 24 hr capsule  . diphenhydramine-acetaminophen (TYLENOL PM) 25-500 MG TABS tablet  . dorzolamide-timolol (COSOPT) 22.3-6.8 MG/ML ophthalmic solution  . ELIQUIS 5 MG TABS tablet  . esomeprazole (NEXIUM) 40 MG  capsule  . hydroxypropyl methylcellulose / hypromellose (ISOPTO TEARS / GONIOVISC) 2.5 % ophthalmic solution  . LIDOCAINE-MENTHOL, SPRAY, EX  . ondansetron (ZOFRAN) 8 MG tablet  . pregabalin (LYRICA) 100 MG capsule  . sucralfate (CARAFATE) 1 g tablet  . trolamine salicylate (ASPERCREME) 10 % cream   No current facility-administered medications for this encounter.   Derrill Memo ON 08/01/2020] bupivacaine liposome (EXPAREL) 1.3 % injection 266 mg   - Pt to hold  eliquis 2 days before surgery   If no changes, I anticipate pt can proceed with surgery as scheduled.   Willeen Cass, PhD, FNP-BC [facility] Short Stay Surgical Center/Anesthesiology Phone: [phone] 07/31/2020 9:27 AM

## 2020-08-01 ENCOUNTER — Ambulatory Visit (HOSPITAL_COMMUNITY)
Admission: RE | Admit: 2020-08-01 | Discharge: 2020-08-01 | Disposition: A | Discharge status: Home / Self Care | Payer: Medicare Other | Attending: Surgery | Admitting: Surgery

## 2020-08-01 ENCOUNTER — Ambulatory Visit (HOSPITAL_COMMUNITY): Payer: Medicare Other | Admitting: Emergency Medicine

## 2020-08-01 ENCOUNTER — Encounter (HOSPITAL_COMMUNITY)
Admission: RE | Disposition: A | Discharge status: Home / Self Care | Payer: Self-pay | Source: Home / Self Care | Attending: Surgery

## 2020-08-01 ENCOUNTER — Ambulatory Visit (HOSPITAL_COMMUNITY): Payer: Medicare Other | Admitting: Anesthesiology

## 2020-08-01 DIAGNOSIS — Z79899 Other long term (current) drug therapy: Secondary | ICD-10-CM | POA: Insufficient documentation

## 2020-08-01 DIAGNOSIS — Z8673 Personal history of transient ischemic attack (TIA), and cerebral infarction without residual deficits: Secondary | ICD-10-CM | POA: Insufficient documentation

## 2020-08-01 DIAGNOSIS — I119 Hypertensive heart disease without heart failure: Secondary | ICD-10-CM | POA: Insufficient documentation

## 2020-08-01 DIAGNOSIS — I480 Paroxysmal atrial fibrillation: Secondary | ICD-10-CM | POA: Diagnosis not present

## 2020-08-01 DIAGNOSIS — H409 Unspecified glaucoma: Secondary | ICD-10-CM | POA: Diagnosis not present

## 2020-08-01 DIAGNOSIS — K219 Gastro-esophageal reflux disease without esophagitis: Secondary | ICD-10-CM | POA: Insufficient documentation

## 2020-08-01 DIAGNOSIS — K449 Diaphragmatic hernia without obstruction or gangrene: Secondary | ICD-10-CM | POA: Insufficient documentation

## 2020-08-01 DIAGNOSIS — Z885 Allergy status to narcotic agent status: Secondary | ICD-10-CM | POA: Insufficient documentation

## 2020-08-01 DIAGNOSIS — Z886 Allergy status to analgesic agent status: Secondary | ICD-10-CM | POA: Diagnosis not present

## 2020-08-01 DIAGNOSIS — Z7901 Long term (current) use of anticoagulants: Secondary | ICD-10-CM | POA: Diagnosis not present

## 2020-08-01 DIAGNOSIS — K760 Fatty (change of) liver, not elsewhere classified: Secondary | ICD-10-CM | POA: Diagnosis not present

## 2020-08-01 DIAGNOSIS — K8010 Calculus of gallbladder with chronic cholecystitis without obstruction: Secondary | ICD-10-CM | POA: Insufficient documentation

## 2020-08-01 DIAGNOSIS — I10 Essential (primary) hypertension: Secondary | ICD-10-CM | POA: Diagnosis not present

## 2020-08-01 DIAGNOSIS — M3500 Sicca syndrome, unspecified: Secondary | ICD-10-CM | POA: Insufficient documentation

## 2020-08-01 DIAGNOSIS — G4730 Sleep apnea, unspecified: Secondary | ICD-10-CM | POA: Insufficient documentation

## 2020-08-01 DIAGNOSIS — E1142 Type 2 diabetes mellitus with diabetic polyneuropathy: Secondary | ICD-10-CM | POA: Diagnosis not present

## 2020-08-01 DIAGNOSIS — K8050 Calculus of bile duct without cholangitis or cholecystitis without obstruction: Secondary | ICD-10-CM | POA: Diagnosis not present

## 2020-08-01 DIAGNOSIS — Z87891 Personal history of nicotine dependence: Secondary | ICD-10-CM | POA: Insufficient documentation

## 2020-08-01 DIAGNOSIS — H5461 Unqualified visual loss, right eye, normal vision left eye: Secondary | ICD-10-CM | POA: Insufficient documentation

## 2020-08-01 HISTORY — PX: CHOLECYSTECTOMY: SHX55

## 2020-08-01 LAB — GLUCOSE, CAPILLARY
Glucose-Capillary: 129 mg/dL — ABNORMAL HIGH (ref 70–99)
Glucose-Capillary: 167 mg/dL — ABNORMAL HIGH (ref 70–99)

## 2020-08-01 SURGERY — LAPAROSCOPIC CHOLECYSTECTOMY
Anesthesia: General | Site: Abdomen

## 2020-08-01 MED ORDER — LACTATED RINGERS IR SOLN
Status: DC | PRN
  Administered 2020-08-01: 1000 mL

## 2020-08-01 MED ORDER — ONDANSETRON HCL 4 MG/2ML IJ SOLN
INTRAMUSCULAR | Status: DC | PRN
  Administered 2020-08-01: 4 mg via INTRAVENOUS

## 2020-08-01 MED ORDER — PROPOFOL 10 MG/ML IV BOLUS
INTRAVENOUS | Status: DC | PRN
  Administered 2020-08-01: 60 mg via INTRAVENOUS

## 2020-08-01 MED ORDER — LACTATED RINGERS IV SOLN: INTRAVENOUS | Status: DC

## 2020-08-01 MED ORDER — DOCUSATE SODIUM 100 MG PO CAPS: 100.0000 mg | ORAL_CAPSULE | Freq: Two times a day (BID) | ORAL | 0 refills | Status: AC

## 2020-08-01 MED ORDER — ONDANSETRON HCL 4 MG/2ML IJ SOLN
4.0000 mg | Freq: Once | INTRAMUSCULAR | Status: AC | PRN
  Administered 2020-08-01: 4 mg via INTRAVENOUS

## 2020-08-01 MED ORDER — CHLORHEXIDINE GLUCONATE 0.12 % MT SOLN
15.0000 mL | Freq: Once | OROMUCOSAL | Status: AC
  Administered 2020-08-01: 15 mL via OROMUCOSAL

## 2020-08-01 MED ORDER — DEXAMETHASONE SODIUM PHOSPHATE 10 MG/ML IJ SOLN
INTRAMUSCULAR | Status: DC | PRN
  Administered 2020-08-01: 4 mg via INTRAVENOUS

## 2020-08-01 MED ORDER — ONDANSETRON HCL 4 MG/2ML IJ SOLN: 4.0000 mg | Freq: Once | INTRAMUSCULAR | Status: DC | PRN

## 2020-08-01 MED ORDER — ACETAMINOPHEN 10 MG/ML IV SOLN
1000.0000 mg | Freq: Once | INTRAVENOUS | Status: DC | PRN
  Administered 2020-08-01: 1000 mg via INTRAVENOUS

## 2020-08-01 MED ORDER — SODIUM CHLORIDE 0.9% FLUSH: 3.0000 mL | INTRAVENOUS | Status: DC | PRN

## 2020-08-01 MED ORDER — ACETAMINOPHEN 10 MG/ML IV SOLN: 1000.0000 mg | Freq: Once | INTRAVENOUS | Status: DC | PRN

## 2020-08-01 MED ORDER — ACETAMINOPHEN 10 MG/ML IV SOLN
INTRAVENOUS | Status: AC
  Filled 2020-08-01: qty 100

## 2020-08-01 MED ORDER — TRAMADOL HCL 50 MG PO TABS: 50.0000 mg | ORAL_TABLET | Freq: Four times a day (QID) | ORAL | 0 refills | Status: DC | PRN

## 2020-08-01 MED ORDER — CHLORHEXIDINE GLUCONATE CLOTH 2 % EX PADS: 6.0000 | MEDICATED_PAD | Freq: Once | CUTANEOUS | Status: DC

## 2020-08-01 MED ORDER — PROPOFOL 10 MG/ML IV BOLUS
INTRAVENOUS | Status: AC
  Filled 2020-08-01: qty 20

## 2020-08-01 MED ORDER — FENTANYL CITRATE (PF) 100 MCG/2ML IJ SOLN: 25.0000 ug | INTRAMUSCULAR | Status: DC | PRN

## 2020-08-01 MED ORDER — BUPIVACAINE-EPINEPHRINE (PF) 0.25% -1:200000 IJ SOLN
INTRAMUSCULAR | Status: AC
  Filled 2020-08-01: qty 30

## 2020-08-01 MED ORDER — SUCCINYLCHOLINE CHLORIDE 200 MG/10ML IV SOSY
PREFILLED_SYRINGE | INTRAVENOUS | Status: DC | PRN
  Administered 2020-08-01: 60 mg via INTRAVENOUS

## 2020-08-01 MED ORDER — CEFAZOLIN SODIUM-DEXTROSE 2-4 GM/100ML-% IV SOLN
2.0000 g | INTRAVENOUS | Status: AC
  Administered 2020-08-01: 2 g via INTRAVENOUS
  Filled 2020-08-01: qty 100

## 2020-08-01 MED ORDER — ACETAMINOPHEN 650 MG RE SUPP: 650.0000 mg | RECTAL | Status: DC | PRN

## 2020-08-01 MED ORDER — ACETAMINOPHEN 325 MG PO TABS: 650.0000 mg | ORAL_TABLET | ORAL | Status: DC | PRN

## 2020-08-01 MED ORDER — FENTANYL CITRATE (PF) 100 MCG/2ML IJ SOLN
INTRAMUSCULAR | Status: AC
  Filled 2020-08-01: qty 2

## 2020-08-01 MED ORDER — PHENYLEPHRINE 40 MCG/ML (10ML) SYRINGE FOR IV PUSH (FOR BLOOD PRESSURE SUPPORT)
PREFILLED_SYRINGE | INTRAVENOUS | Status: DC | PRN
  Administered 2020-08-01 (×2): 80 ug via INTRAVENOUS

## 2020-08-01 MED ORDER — TRAMADOL HCL 50 MG PO TABS: 50.0000 mg | ORAL_TABLET | Freq: Four times a day (QID) | ORAL | Status: DC | PRN

## 2020-08-01 MED ORDER — ROCURONIUM BROMIDE 10 MG/ML (PF) SYRINGE
PREFILLED_SYRINGE | INTRAVENOUS | Status: AC
  Filled 2020-08-01: qty 10

## 2020-08-01 MED ORDER — DEXAMETHASONE SODIUM PHOSPHATE 10 MG/ML IJ SOLN
INTRAMUSCULAR | Status: AC
  Filled 2020-08-01: qty 1

## 2020-08-01 MED ORDER — ONDANSETRON HCL 4 MG/2ML IJ SOLN
INTRAMUSCULAR | Status: AC
  Filled 2020-08-01: qty 2

## 2020-08-01 MED ORDER — ROCURONIUM BROMIDE 10 MG/ML (PF) SYRINGE
PREFILLED_SYRINGE | INTRAVENOUS | Status: DC | PRN
  Administered 2020-08-01: 40 mg via INTRAVENOUS
  Administered 2020-08-01: 10 mg via INTRAVENOUS

## 2020-08-01 MED ORDER — BUPIVACAINE-EPINEPHRINE 0.25% -1:200000 IJ SOLN
INTRAMUSCULAR | Status: DC | PRN
  Administered 2020-08-01: 30 mL

## 2020-08-01 MED ORDER — 0.9 % SODIUM CHLORIDE (POUR BTL) OPTIME
TOPICAL | Status: DC | PRN
  Administered 2020-08-01: 1000 mL

## 2020-08-01 MED ORDER — ACETAMINOPHEN 500 MG PO TABS
1000.0000 mg | ORAL_TABLET | ORAL | Status: AC
  Administered 2020-08-01: 1000 mg via ORAL
  Filled 2020-08-01: qty 2

## 2020-08-01 MED ORDER — SODIUM CHLORIDE 0.9% FLUSH: 3.0000 mL | Freq: Two times a day (BID) | INTRAVENOUS | Status: DC

## 2020-08-01 MED ORDER — PROPOFOL 1000 MG/100ML IV EMUL
INTRAVENOUS | Status: AC
  Filled 2020-08-01: qty 100

## 2020-08-01 MED ORDER — LIDOCAINE 2% (20 MG/ML) 5 ML SYRINGE
INTRAMUSCULAR | Status: AC
  Filled 2020-08-01: qty 5

## 2020-08-01 MED ORDER — LIDOCAINE 2% (20 MG/ML) 5 ML SYRINGE
INTRAMUSCULAR | Status: DC | PRN
  Administered 2020-08-01: 1.5 mg/kg/h via INTRAVENOUS

## 2020-08-01 MED ORDER — BUPIVACAINE LIPOSOME 1.3 % IJ SUSP
INTRAMUSCULAR | Status: DC | PRN
  Administered 2020-08-01: 20 mL

## 2020-08-01 MED ORDER — FENTANYL CITRATE (PF) 100 MCG/2ML IJ SOLN
INTRAMUSCULAR | Status: DC | PRN
  Administered 2020-08-01 (×4): 25 ug via INTRAVENOUS

## 2020-08-01 MED ORDER — DEXMEDETOMIDINE (PRECEDEX) IN NS 20 MCG/5ML (4 MCG/ML) IV SYRINGE
PREFILLED_SYRINGE | INTRAVENOUS | Status: DC | PRN
  Administered 2020-08-01: 4 ug via INTRAVENOUS
  Administered 2020-08-01: 2 ug via INTRAVENOUS

## 2020-08-01 MED ORDER — PROPOFOL 500 MG/50ML IV EMUL
INTRAVENOUS | Status: DC | PRN
  Administered 2020-08-01: 150 ug/kg/min via INTRAVENOUS

## 2020-08-01 MED ORDER — INDOCYANINE GREEN 25 MG IV SOLR: 2.5000 mg | Freq: Once | INTRAVENOUS | Status: DC

## 2020-08-01 MED ORDER — SUGAMMADEX SODIUM 200 MG/2ML IV SOLN
INTRAVENOUS | Status: DC | PRN
  Administered 2020-08-01: 200 mg via INTRAVENOUS

## 2020-08-01 MED ORDER — LIDOCAINE 2% (20 MG/ML) 5 ML SYRINGE
INTRAMUSCULAR | Status: DC | PRN
  Administered 2020-08-01: 100 mg via INTRAVENOUS

## 2020-08-01 MED ORDER — FENTANYL CITRATE (PF) 100 MCG/2ML IJ SOLN
25.0000 ug | INTRAMUSCULAR | Status: DC | PRN
  Administered 2020-08-01 (×2): 50 ug via INTRAVENOUS

## 2020-08-01 MED ORDER — SODIUM CHLORIDE 0.9 % IV SOLN: 250.0000 mL | INTRAVENOUS | Status: DC | PRN

## 2020-08-01 MED ORDER — ORAL CARE MOUTH RINSE: 15.0000 mL | Freq: Once | OROMUCOSAL | Status: AC

## 2020-08-01 SURGICAL SUPPLY — 39 items
ADH SKN CLS APL DERMABOND .7 (GAUZE/BANDAGES/DRESSINGS) ×1
APL PRP STRL LF DISP 70% ISPRP (MISCELLANEOUS) ×1
APPLIER CLIP ROT 10 11.4 M/L (STAPLE) ×2
APR CLP MED LRG 11.4X10 (STAPLE) ×1
BAG SPEC RTRVL LRG 6X4 10 (ENDOMECHANICALS) ×1
CABLE HIGH FREQUENCY MONO STRZ (ELECTRODE) ×2 IMPLANT
CHLORAPREP W/TINT 26 (MISCELLANEOUS) ×2 IMPLANT
CLIP APPLIE ROT 10 11.4 M/L (STAPLE) ×1 IMPLANT
COVER MAYO STAND STRL (DRAPES) IMPLANT
COVER SURGICAL LIGHT HANDLE (MISCELLANEOUS) ×2 IMPLANT
COVER WAND RF STERILE (DRAPES) IMPLANT
DECANTER SPIKE VIAL GLASS SM (MISCELLANEOUS) ×2 IMPLANT
DERMABOND ADVANCED (GAUZE/BANDAGES/DRESSINGS) ×1
DERMABOND ADVANCED .7 DNX12 (GAUZE/BANDAGES/DRESSINGS) ×1 IMPLANT
DRAPE C-ARM 42X120 X-RAY (DRAPES) IMPLANT
ELECT REM PT RETURN 15FT ADLT (MISCELLANEOUS) ×2 IMPLANT
GLOVE SURG ENC MOIS LTX SZ6 (GLOVE) ×2 IMPLANT
GLOVE SURG UNDER LTX SZ6.5 (GLOVE) ×2 IMPLANT
GOWN STRL REUS W/TWL LRG LVL3 (GOWN DISPOSABLE) ×2 IMPLANT
GOWN STRL REUS W/TWL XL LVL3 (GOWN DISPOSABLE) ×4 IMPLANT
GRASPER SUT TROCAR 14GX15 (MISCELLANEOUS) ×2 IMPLANT
HEMOSTAT SNOW SURGICEL 2X4 (HEMOSTASIS) ×1 IMPLANT
KIT BASIN OR (CUSTOM PROCEDURE TRAY) ×2 IMPLANT
KIT TURNOVER KIT A (KITS) ×2 IMPLANT
NDL INSUFFLATION 14GA 120MM (NEEDLE) ×1 IMPLANT
NEEDLE INSUFFLATION 14GA 120MM (NEEDLE) ×2 IMPLANT
PENCIL SMOKE EVACUATOR (MISCELLANEOUS) IMPLANT
POUCH SPECIMEN RETRIEVAL 10MM (ENDOMECHANICALS) ×2 IMPLANT
SCISSORS LAP 5X35 DISP (ENDOMECHANICALS) ×2 IMPLANT
SET CHOLANGIOGRAPH MIX (MISCELLANEOUS) IMPLANT
SET IRRIG TUBING LAPAROSCOPIC (IRRIGATION / IRRIGATOR) ×2 IMPLANT
SET TUBE SMOKE EVAC HIGH FLOW (TUBING) ×2 IMPLANT
SLEEVE XCEL OPT CAN 5 100 (ENDOMECHANICALS) ×5 IMPLANT
SUT MNCRL AB 4-0 PS2 18 (SUTURE) ×2 IMPLANT
TOWEL OR 17X26 10 PK STRL BLUE (TOWEL DISPOSABLE) ×2 IMPLANT
TOWEL OR NON WOVEN STRL DISP B (DISPOSABLE) IMPLANT
TRAY LAPAROSCOPIC (CUSTOM PROCEDURE TRAY) ×2 IMPLANT
TROCAR BLADELESS OPT 5 100 (ENDOMECHANICALS) ×2 IMPLANT
TROCAR XCEL 12X100 BLDLESS (ENDOMECHANICALS) ×2 IMPLANT

## 2020-08-01 NOTE — Discharge Instructions (Signed)
LAPAROSCOPIC SURGERY: POST OP INSTRUCTIONS   EAT Gradually transition to a high fiber diet with a fiber supplement over the next few weeks after discharge.  Start with a pureed / full liquid diet (see below)  WALK Walk an hour a day (cumulative- not all at once).  Control your pain to do that.    CONTROL PAIN Control pain so that you can walk, sleep, tolerate sneezing/coughing, go up/down stairs.  HAVE A BOWEL MOVEMENT DAILY Keep your bowels regular to avoid problems.  OK to try a laxative to override constipation.  OK to use an antidairrheal to slow down diarrhea.  Call if not better after 2 tries  CALL IF YOU HAVE PROBLEMS/CONCERNS Call if you are still struggling despite following these instructions. Call if you have concerns not answered by these instructions    1. DIET: Follow a light bland diet & liquids the first 24 hours after arrival home, such as soup, liquids, starches, etc.  Be sure to drink plenty of fluids.  Quickly advance to a usual solid diet within a few days.  Avoid fast food or heavy meals as your are more likely to get nauseated or have irregular bowels.  A low-sugar, high-fiber diet for the rest of your life is ideal.  2. Take your usually prescribed home medications unless otherwise directed.  3. PAIN CONTROL: a. Pain is best controlled by a usual combination of three different methods TOGETHER: i. Ice/Heat ii. Over the counter pain medication iii. Prescription pain medication b. Most patients will experience some swelling and bruising around the incisions.  Ice packs or heating pads (30-60 minutes up to 6 times a day) will help. Use ice for the first few days to help decrease swelling and bruising, then switch to heat to help relax tight/sore spots and speed recovery.  Some people prefer to use ice alone, heat alone, alternating between ice & heat.  Experiment to what works for you.  Swelling and bruising can take several weeks to resolve.   c. It is helpful to  take an over-the-counter pain medication regularly for the first few days: i. Naproxen (Aleve, etc)  Two 220mg  tabs twice a day OR Ibuprofen (Advil, etc) Three 200mg  tabs four times a day (every meal & bedtime) AND ii. Acetaminophen (Tylenol, etc) 500-650mg  four times a day (every meal & bedtime) d. A  prescription for pain medication (such as oxycodone, hydrocodone, tramadol, gabapentin, methocarbamol, etc) should be given to you upon discharge.  Take your pain medication as prescribed, IF NEEDED.  i. If you are having problems/concerns with the prescription medicine (does not control pain, nausea, vomiting, rash, itching, etc), please call us [phone] to see if we need to switch you to a different pain medicine that will work better for you and/or control your side effect better. ii. If you need a refill on your pain medication, please give [place] 48 hour notice.  contact your pharmacy.  They will contact our office to request authorization. Prescriptions will not be filled after 5 pm or on week-ends  4. Avoid getting constipated.   a. Between the surgery and the pain medications, it is common to experience some constipation.   b. Increasing fluid intake and taking a fiber supplement (such as Metamucil, Citrucel, FiberCon, MiraLax, etc) 1-2 times a day regularly will usually help prevent this problem from occurring.   c. A mild laxative (prune juice, Milk of Magnesia, MiraLax, etc) should be taken according to package directions if there are no  bowel movements after 48 hours.   5. Watch out for diarrhea.   a. If you have many loose bowel movements, simplify your diet to bland foods & liquids for a few days.   b. Stop any stool softeners and decrease your fiber supplement.   c. Switching to mild anti-diarrheal medications (Kayopectate, Pepto Bismol) can help.   d. If this worsens or does not improve, please call us.  6. Wash / shower every day.  You may shower over the skin glue which is  waterproof  7. Glue will flake off after about 2 weeks.  You may leave the incision open to air.  You may replace a dressing/Band-Aid to cover the incision for comfort if you wish.   8. ACTIVITIES as tolerated:   a. You may resume regular (light) daily activities beginning the next day--such as daily self-care, walking, climbing stairs--gradually increasing activities as tolerated.  If you can walk 30 minutes without difficulty, it is safe to try more intense activity such as jogging, treadmill, bicycling, low-impact aerobics, swimming, etc. b. Save the most intensive and strenuous activity for last such as sit-ups, heavy lifting, contact sports, etc  Refrain from any heavy lifting or straining until you are off narcotics for pain control.   c. DO NOT PUSH THROUGH PAIN.  Let pain be your guide: If it hurts to do something, don't do it.  Pain is your body warning you to avoid that activity for another week until the pain goes down. d. You may drive when you are no longer taking prescription pain medication, you can comfortably wear a seatbelt, and you can safely maneuver your car and apply brakes. e. Dennis Bast may have sexual intercourse when it is comfortable.  9. FOLLOW UP in our office a. Please call [facility] at [phone] to set up an appointment to see your surgeon in the office for a follow-up appointment approximately 2-3 weeks after your surgery. b. Make sure that you call for this appointment the day you arrive home to insure a convenient appointment time.  10. IF YOU HAVE DISABILITY OR FAMILY LEAVE FORMS, BRING THEM TO THE OFFICE FOR PROCESSING.  DO NOT GIVE THEM TO YOUR DOCTOR.   WHEN TO CALL us [phone]: 1. Poor pain control 2. Reactions / problems with new medications (rash/itching, nausea, etc)  3. Fever over 101.5 F (38.5 C) 4. Inability to urinate 5. Nausea and/or vomiting 6. Worsening swelling or bruising 7. Continued bleeding from incision. 8. Increased pain, redness, or  drainage from the incision   The clinic staff is available to answer your questions during regular business hours (8:30am-5pm).  Please don't hesitate to call and ask to speak to one of our nurses for clinical concerns.   If you have a medical emergency, go to the nearest emergency room or call 911.  A surgeon from [facility] is always on call at the [facility], [place], [place], [place], [place]  [address] ? MAIN: [phone] ? TOLL FREE: [phone] ?  FAX [phone] [email or web address]

## 2020-08-01 NOTE — Transfer of Care (Signed)
Immediate Anesthesia Transfer of Care Note  Patient: Krystal Clarke  Procedure(s) Performed: LAPAROSCOPIC CHOLECYSTECTOMY (N/A Abdomen)  Patient Location: PACU  Anesthesia Type:General  Level of Consciousness: awake and alert   Airway & Oxygen Therapy: Patient Spontanous Breathing and Patient connected to face mask oxygen  Post-op Assessment: Report given to RN, Post -op Vital signs reviewed and stable and Patient moving all extremities X 4  Post vital signs: Reviewed and stable  Last Vitals:  Vitals Value Taken Time  BP 149/93 08/01/20 1117  Temp    Pulse 59 08/01/20 1119  Resp 16 08/01/20 1119  SpO2 100 % 08/01/20 1119  Vitals shown include unvalidated device data.  Last Pain:  Vitals:   08/01/20 0827  TempSrc:   PainSc: 0-No pain         Complications: No complications documented.

## 2020-08-01 NOTE — Op Note (Signed)
Operative Note  Krystal Clarke 85 y.o. female [id number]  08/01/2020  Surgeon: Clovis Riley MD FACS  Assistant: Dolphus Jenny MD PGY6  Procedure performed: Laparoscopic Cholecystectomy  Preop diagnosis: biliary colic Post-op diagnosis/intraop findings: same  Specimens: gallbladder  Retained items: none  EBL: minimal  Complications: none  Description of procedure: After obtaining informed consent the patient was brought to the operating room. Antibiotics were administered. SCD's were applied. General endotracheal anesthesia was initiated and a formal time-out was performed. The abdomen was prepped and draped in the usual sterile fashion and the abdomen was entered using a left subcostal veress needle and insufflated to 45mmHg without incident. after instilling the site with local. Insufflation to 82mmHg was obtained, 62mm trocar and camera inserted, and gross inspection revealed no evidence of injury from our entry or other intraabdominal abnormalities. Three 30mm trocars were introduced in the supraumbilical, right midclavicular and right anterior axillary lines under direct visualization and following infiltration with local. An 26mm trocar was placed in the epigastrium. The gallbladder was retracted cephalad and the infundibulum was retracted laterally. Omental adhesions to the gallbladder were taken down bluntly and with cautery. A combination of hook electrocautery and blunt dissection was utilized to clear the peritoneum from the neck and cystic duct, circumferentially isolating the cystic artery and cystic duct and lifting the gallbladder from the cystic plate. The critical view of safety was achieved with the cystic artery, cystic duct, and liver bed visualized between them with no other structures. The artery was clipped with two clips proximally and one distally and divided as was the cystic duct with four clips on the proximal end. The gallbladder was dissected from the liver plate  using electrocautery. Once freed the gallbladder was placed in an endocatch bag and removed intact through the epigastric trocar site. A small amount of bleeding on the liver bed was controlled with cautery and surgicel snow. The right upper quadrant was irrigated and aspirated; the effluent was clear. Hemostasis was once again confirmed, and reinspection of the abdomen revealed no injuries. The clips were well opposed without any bile leak from the duct or the liver bed. The 110mm trocar site in the epigastrium was closed with a 0 vicryl in the fascia under direct visualization using a PMI device. The abdomen was desufflated and all trocars removed. The skin incisions were closed with 4-0 subcuticular monocryl and Dermabond. The patient was awakened, extubated and transported to the recovery room in stable condition.    All counts were correct at the completion of the case.

## 2020-08-01 NOTE — Interval H&P Note (Signed)
History and Physical Interval Note:  08/01/2020 9:07 AM  Krystal Clarke  has presented today for surgery, with the diagnosis of BILIARY COLIC.  The various methods of treatment have been discussed with the patient and family. After consideration of risks, benefits and other options for treatment, the patient has consented to  Procedure(s): LAPAROSCOPIC CHOLECYSTECTOMY (N/A) as a surgical intervention.  The patient's history has been reviewed, patient examined, no change in status, stable for surgery.  I have reviewed the patient's chart and labs.  Questions were answered to the patient's satisfaction.     Jhovany Weidinger Rich Brave

## 2020-08-01 NOTE — Anesthesia Procedure Notes (Signed)
Procedure Name: Intubation Date/Time: 08/01/2020 9:55 AM Performed by: Niel Hummer, CRNA Pre-anesthesia Checklist: Patient identified, Emergency Drugs available, Suction available and Patient being monitored Patient Re-evaluated:Patient Re-evaluated prior to induction Oxygen Delivery Method: Circle system utilized Preoxygenation: Pre-oxygenation with 100% oxygen Induction Type: IV induction, Rapid sequence and Cricoid Pressure applied Laryngoscope Size: Mac and 4 Grade View: Grade I Tube type: Oral Tube size: 7.0 mm Number of attempts: 1 Airway Equipment and Method: Stylet Placement Confirmation: ETT inserted through vocal cords under direct vision,  positive ETCO2 and breath sounds checked- equal and bilateral Secured at: 21 cm Tube secured with: Tape Dental Injury: Teeth and Oropharynx as per pre-operative assessment

## 2020-08-01 NOTE — Anesthesia Postprocedure Evaluation (Signed)
Anesthesia Post Note  Patient: Krystal Clarke  Procedure(s) Performed: LAPAROSCOPIC CHOLECYSTECTOMY (N/A Abdomen)     Patient location during evaluation: PACU Anesthesia Type: General Level of consciousness: awake and alert Pain management: pain level controlled Vital Signs Assessment: post-procedure vital signs reviewed and stable Respiratory status: spontaneous breathing, nonlabored ventilation, respiratory function stable and patient connected to nasal cannula oxygen Cardiovascular status: blood pressure returned to baseline and stable Postop Assessment: no apparent nausea or vomiting Anesthetic complications: no   No complications documented.  Last Vitals:  Vitals:   08/01/20 1230 08/01/20 1241  BP: (!) 132/92 (!) 150/75  Pulse: 87   Resp: 16 16  Temp: (!) 36 C (!) 36.3 C  SpO2: 93% 92%    Last Pain:  Vitals:   08/01/20 1241  TempSrc:   PainSc: 0-No pain                 Barnet Glasgow

## 2020-08-02 ENCOUNTER — Encounter (HOSPITAL_COMMUNITY): Payer: Self-pay | Admitting: Surgery

## 2020-08-02 LAB — SURGICAL PATHOLOGY

## 2020-08-06 ENCOUNTER — Other Ambulatory Visit: Payer: Self-pay | Admitting: Neurology

## 2020-08-07 NOTE — Telephone Encounter (Signed)
Please advise 

## 2020-08-16 DIAGNOSIS — E785 Hyperlipidemia, unspecified: Secondary | ICD-10-CM | POA: Diagnosis not present

## 2020-08-16 DIAGNOSIS — M47816 Spondylosis without myelopathy or radiculopathy, lumbar region: Secondary | ICD-10-CM | POA: Diagnosis not present

## 2020-08-16 DIAGNOSIS — D582 Other hemoglobinopathies: Secondary | ICD-10-CM | POA: Diagnosis not present

## 2020-08-16 DIAGNOSIS — G4700 Insomnia, unspecified: Secondary | ICD-10-CM | POA: Diagnosis not present

## 2020-08-16 DIAGNOSIS — M1711 Unilateral primary osteoarthritis, right knee: Secondary | ICD-10-CM | POA: Diagnosis not present

## 2020-08-16 DIAGNOSIS — I480 Paroxysmal atrial fibrillation: Secondary | ICD-10-CM | POA: Diagnosis not present

## 2020-08-16 DIAGNOSIS — K219 Gastro-esophageal reflux disease without esophagitis: Secondary | ICD-10-CM | POA: Diagnosis not present

## 2020-08-16 DIAGNOSIS — E1143 Type 2 diabetes mellitus with diabetic autonomic (poly)neuropathy: Secondary | ICD-10-CM | POA: Diagnosis not present

## 2020-08-31 DIAGNOSIS — K639 Disease of intestine, unspecified: Secondary | ICD-10-CM | POA: Diagnosis not present

## 2020-08-31 DIAGNOSIS — R110 Nausea: Secondary | ICD-10-CM | POA: Diagnosis not present

## 2020-08-31 DIAGNOSIS — R159 Full incontinence of feces: Secondary | ICD-10-CM | POA: Diagnosis not present

## 2020-09-11 ENCOUNTER — Ambulatory Visit: Payer: Medicare Other | Admitting: Neurology

## 2020-10-04 DIAGNOSIS — E1143 Type 2 diabetes mellitus with diabetic autonomic (poly)neuropathy: Secondary | ICD-10-CM | POA: Diagnosis not present

## 2020-10-04 DIAGNOSIS — D582 Other hemoglobinopathies: Secondary | ICD-10-CM | POA: Diagnosis not present

## 2020-10-04 DIAGNOSIS — E785 Hyperlipidemia, unspecified: Secondary | ICD-10-CM | POA: Diagnosis not present

## 2020-10-04 DIAGNOSIS — K219 Gastro-esophageal reflux disease without esophagitis: Secondary | ICD-10-CM | POA: Diagnosis not present

## 2020-10-04 DIAGNOSIS — M47816 Spondylosis without myelopathy or radiculopathy, lumbar region: Secondary | ICD-10-CM | POA: Diagnosis not present

## 2020-10-04 DIAGNOSIS — M1711 Unilateral primary osteoarthritis, right knee: Secondary | ICD-10-CM | POA: Diagnosis not present

## 2020-10-04 DIAGNOSIS — I480 Paroxysmal atrial fibrillation: Secondary | ICD-10-CM | POA: Diagnosis not present

## 2020-10-04 DIAGNOSIS — G4700 Insomnia, unspecified: Secondary | ICD-10-CM | POA: Diagnosis not present

## 2020-10-05 ENCOUNTER — Ambulatory Visit: Payer: Medicare Other | Admitting: Cardiology

## 2020-10-18 DIAGNOSIS — R110 Nausea: Secondary | ICD-10-CM | POA: Diagnosis not present

## 2020-10-18 DIAGNOSIS — R1013 Epigastric pain: Secondary | ICD-10-CM | POA: Diagnosis not present

## 2020-10-20 ENCOUNTER — Other Ambulatory Visit: Payer: Self-pay | Admitting: Cardiology

## 2020-10-20 DIAGNOSIS — I4891 Unspecified atrial fibrillation: Secondary | ICD-10-CM

## 2020-10-26 DIAGNOSIS — R110 Nausea: Secondary | ICD-10-CM | POA: Diagnosis not present

## 2020-10-26 DIAGNOSIS — R231 Pallor: Secondary | ICD-10-CM | POA: Diagnosis not present

## 2020-10-26 DIAGNOSIS — I959 Hypotension, unspecified: Secondary | ICD-10-CM | POA: Diagnosis not present

## 2020-10-26 DIAGNOSIS — R000 Tachycardia, unspecified: Secondary | ICD-10-CM | POA: Diagnosis not present

## 2020-10-26 DIAGNOSIS — R1084 Generalized abdominal pain: Secondary | ICD-10-CM | POA: Diagnosis not present

## 2020-10-30 ENCOUNTER — Other Ambulatory Visit: Payer: Self-pay | Admitting: Neurology

## 2020-11-02 ENCOUNTER — Other Ambulatory Visit: Payer: Self-pay

## 2020-11-02 ENCOUNTER — Ambulatory Visit (INDEPENDENT_AMBULATORY_CARE_PROVIDER_SITE_OTHER): Payer: Medicare Other | Admitting: Neurology

## 2020-11-02 ENCOUNTER — Encounter: Payer: Self-pay | Admitting: Neurology

## 2020-11-02 VITALS — BP 159/93 | HR 95 | Ht 60.0 in | Wt 163.0 lb

## 2020-11-02 DIAGNOSIS — G44221 Chronic tension-type headache, intractable: Secondary | ICD-10-CM | POA: Diagnosis not present

## 2020-11-02 DIAGNOSIS — G629 Polyneuropathy, unspecified: Secondary | ICD-10-CM | POA: Diagnosis not present

## 2020-11-02 DIAGNOSIS — G63 Polyneuropathy in diseases classified elsewhere: Secondary | ICD-10-CM | POA: Diagnosis not present

## 2020-11-02 DIAGNOSIS — M3506 Sjogren syndrome with peripheral nervous system involvement: Secondary | ICD-10-CM | POA: Diagnosis not present

## 2020-11-02 DIAGNOSIS — Z8673 Personal history of transient ischemic attack (TIA), and cerebral infarction without residual deficits: Secondary | ICD-10-CM

## 2020-11-02 NOTE — Progress Notes (Signed)
Follow-up Visit   Date: 11/02/20    Krystal Clarke MRN: [id number] DOB: [birth year]   [name] is a 85 y.o. right-handed Caucasian female with hypertension, Sjogren's syndrome (diagnosed 0000000) complicated by neuropathy and vision impairment, migraines, atrial fibrillation (on apixiban), history of shingles affecting left T6 (07/2009) and stroke (September 2014, residual word-finding difficulty) returning to the clinic for follow-up visit for neuropathy and headaches.    History of present illness: She was under the care of Dr. Erling Cruz at [facility] for a over 30 years for migraines, stroke, peripheral neuropathy, headaches, and gait instability. She has a long history of migraines or started at the age of 56. Her headaches were well controlled on Depakote 250 twice a day. However, it was stopped due to low platelets and anticoagulation. Previously tried medication for headaches is depakote, Botox (2 trials without benefit) and maxalt. Being on apixiban, she is unable to take NSAIDs due to bleeding risk.  She was given a prednisone taper dose with dramatic improvement of headaches and has been relatively well since then.   She was admitted to the hospital in September 2014 for community-acquired pneumonia and found to have new onset atrial fibrillation. Due to worsening headaches, MRI of the brain was obtained in October which showed a small left thalamic subacute stroke.  In 2015, she was started on Lyrica 150-'300mg'$  for headaches and neuropathy and responded well.    In 2017, she was having sneezing spells with shaking of the arms.  This was evaluated with EEG which was normal, this became worse again in 2018, again EEG was negative. She was hospitalized in November 2018 with colovesical fistula s/p robotic colectomy & bladder closure 02/07/2016.  UPDATE 05/19/2020:  She is here for follow-up visit with her husband.  They will be celebrating 36 years of marriage next month. She  tells me that her neuropathy is stable and well controlled on Lyrica '100mg'$  twice daily.  She no longer has headaches and does not take tizanidine.  Her visual hallucinations have also resolved Krystal Clarke Syndrome).  Overall vision is very poor, she is legally blind.  Her primary ongoing issue is chronic nausea for the past 10 months, she is followed by GI.  Medications:  Current Outpatient Medications on File Prior to Visit  Medication Sig Dispense Refill   acetaminophen (TYLENOL) 325 MG tablet Take 650 mg by mouth every 6 (six) hours as needed for moderate pain.     diltiazem (CARDIZEM CD) 180 MG 24 hr capsule TAKE 1 CAPSULE BY MOUTH EVERY DAY 90 capsule 1   diphenhydramine-acetaminophen (TYLENOL PM) 25-500 MG TABS tablet Take 2 tablets by mouth at bedtime as needed (sleep).     dorzolamide-timolol (COSOPT) 22.3-6.8 MG/ML ophthalmic solution Place 1 drop into both eyes 2 (two) times daily.     ELIQUIS 5 MG TABS tablet TAKE 1 TABLET BY MOUTH TWICE A DAY (Patient taking differently: Take 5 mg by mouth 2 (two) times daily.) 180 tablet 1   hydroxypropyl methylcellulose / hypromellose (ISOPTO TEARS / GONIOVISC) 2.5 % ophthalmic solution Place 1 drop into both eyes 3 (three) times daily as needed for dry eyes.     ondansetron (ZOFRAN) 8 MG tablet Take 8 mg by mouth every 6 (six) hours as needed for vomiting or nausea.  0   pregabalin (LYRICA) 100 MG capsule TAKE 1 CAPSULE BY MOUTH TWICE A DAY 60 capsule 5   sucralfate (CARAFATE) 1 g tablet Take 1 g by mouth  2 (two) times daily.     No current facility-administered medications on file prior to visit.    Allergies:  Allergies  Allergen Reactions   Linaclotide Diarrhea   Oxycodone-Aspirin Nausea And Vomiting   Codeine Nausea And Vomiting and Other (See Comments)   Hydrocodone Nausea And Vomiting   Morphine And Related Nausea And Vomiting   Oxycodone Nausea And Vomiting   Pilocarpine Other (See Comments)    Reaction:  Unknown    Rivaroxaban  Other (See Comments)    Reaction:  Back pain/headaches      Vital Signs:  BP (!) 159/93 (BP Location: Left Arm, Patient Position: Sitting, Cuff Size: Normal)   Pulse 95   Ht 5' (1.524 m)   Wt 163 lb (73.9 kg)   SpO2 96%   BMI 31.83 kg/m   Neurological Exam: MENTAL STATUS including orientation to time, place, person, recent and remote memory, attention span and concentration, language, and fund of knowledge is good.   Speech is not dysarthric.    CRANIAL NERVES:  Severe visual impairment bilaterally, blind in the right eye, gross movements in the left.  Mild bilateral ptosis. Face is symmetric.   MOTOR: Motor strength is 5-/5 proximally.  Left dorsiflexion and toe extension and bilateral hand abduction is 4/5.     SENSORY:  Vibration is intact at the knees, absent distal to ankles.  COORDINATION/GAIT: Gait is moderately wide-based, slow, assisted with rollator  Data: MRI brain wo contrast 12/23/2012: Very small acute/ subacute left thalamic nonhemorrhagic infarct. Prominent small vessel disease type changes. No intracranial hemorrhage. Mild atrophy without hydrocephalus.  Prior work-up at [facility]: EEG 06/03/1995, 08/31/2008, 05/25/2015: normal  CSF 1991: normal  Ambulatory EEG 03/24/2017:  Normal  MRI brain 11/02/2017:  Moderate atrophy, with progression since 2014. Mild chronic microvascular ischemic changes similar to the prior study.  No acute abnormality.  MRI brain wo contrast 06/09/2020: Negative for acute infarct Progressive atrophy. Progressive chronic microvascular ischemic changes. New areas of chronic microhemorrhage right temporal and right parietal lobe. Correlate with hypertension control.  Lab Results  Component Value Date   [id number] 04/12/2016   Lab Results  Component Value Date   TSH 0.834 12/06/2012    IMPRESSION/PLAN:   1.  Peripheral neuropathy due to Sjogren's disease, stable  - Continue Lyrica '100mg'$  BID which controls pain  2.  Tension headaches,  resolved  - Responsive to tizanidine '2mg'$  in the past, if she develops this again, OK to restart  3.  Migraine without aura (resolved).  Previously used:  VPA (stopped due to low PLT), triptans (stroke), flexeril (no improvement), botox, medrol dose pack (effective)  4. History of left thalamic stroke, likely cardioembolic due to afib on apixiban with mild residual expressive aphasia  5.  Mild vascular dementia.  Stable.  6.  Chronic debility from medical comorbidities (neuropathy, visual impairment, arthritis)   Return to clinic in 6 months  Total time spent reviewing records, interview, history/exam, documentation, and coordination of care on day of encounter:  20 min   Thank you for allowing me to participate in patient's care.  If I can answer any additional questions, I would be pleased to do so.    Sincerely,    Nechuma Boven K. Posey Pronto, DO

## 2020-11-02 NOTE — Patient Instructions (Signed)
Return to clinic in 6 months.

## 2020-11-23 DIAGNOSIS — E559 Vitamin D deficiency, unspecified: Secondary | ICD-10-CM | POA: Diagnosis not present

## 2020-11-23 DIAGNOSIS — E538 Deficiency of other specified B group vitamins: Secondary | ICD-10-CM | POA: Diagnosis not present

## 2020-11-23 DIAGNOSIS — R61 Generalized hyperhidrosis: Secondary | ICD-10-CM | POA: Diagnosis not present

## 2020-11-23 DIAGNOSIS — D6869 Other thrombophilia: Secondary | ICD-10-CM | POA: Diagnosis not present

## 2020-11-23 DIAGNOSIS — E1143 Type 2 diabetes mellitus with diabetic autonomic (poly)neuropathy: Secondary | ICD-10-CM | POA: Diagnosis not present

## 2020-11-23 DIAGNOSIS — I480 Paroxysmal atrial fibrillation: Secondary | ICD-10-CM | POA: Diagnosis not present

## 2020-11-23 DIAGNOSIS — R110 Nausea: Secondary | ICD-10-CM | POA: Diagnosis not present

## 2020-11-23 DIAGNOSIS — Z23 Encounter for immunization: Secondary | ICD-10-CM | POA: Diagnosis not present

## 2020-11-23 DIAGNOSIS — E785 Hyperlipidemia, unspecified: Secondary | ICD-10-CM | POA: Diagnosis not present

## 2020-11-24 DIAGNOSIS — Z1152 Encounter for screening for COVID-19: Secondary | ICD-10-CM | POA: Diagnosis not present

## 2020-11-27 ENCOUNTER — Ambulatory Visit: Payer: Medicare Other | Admitting: Neurology

## 2020-11-27 ENCOUNTER — Other Ambulatory Visit: Payer: Self-pay | Admitting: Family Medicine

## 2020-11-28 ENCOUNTER — Other Ambulatory Visit: Payer: Self-pay | Admitting: Family Medicine

## 2020-11-28 DIAGNOSIS — R110 Nausea: Secondary | ICD-10-CM

## 2020-11-28 DIAGNOSIS — R1013 Epigastric pain: Secondary | ICD-10-CM

## 2020-12-11 DIAGNOSIS — R159 Full incontinence of feces: Secondary | ICD-10-CM | POA: Diagnosis not present

## 2020-12-11 DIAGNOSIS — R110 Nausea: Secondary | ICD-10-CM | POA: Diagnosis not present

## 2020-12-11 DIAGNOSIS — R198 Other specified symptoms and signs involving the digestive system and abdomen: Secondary | ICD-10-CM | POA: Diagnosis not present

## 2020-12-14 ENCOUNTER — Ambulatory Visit
Admission: RE | Admit: 2020-12-14 | Discharge: 2020-12-14 | Disposition: A | Discharge status: Home / Self Care | Payer: Medicare Other | Source: Ambulatory Visit | Attending: Family Medicine | Admitting: Family Medicine

## 2020-12-14 DIAGNOSIS — R110 Nausea: Secondary | ICD-10-CM

## 2020-12-14 DIAGNOSIS — M4696 Unspecified inflammatory spondylopathy, lumbar region: Secondary | ICD-10-CM | POA: Diagnosis not present

## 2020-12-14 DIAGNOSIS — N281 Cyst of kidney, acquired: Secondary | ICD-10-CM | POA: Diagnosis not present

## 2020-12-14 DIAGNOSIS — R1013 Epigastric pain: Secondary | ICD-10-CM

## 2020-12-14 DIAGNOSIS — M47816 Spondylosis without myelopathy or radiculopathy, lumbar region: Secondary | ICD-10-CM | POA: Diagnosis not present

## 2020-12-14 MED ORDER — IOPAMIDOL (ISOVUE-300) INJECTION 61%
100.0000 mL | Freq: Once | INTRAVENOUS | Status: AC | PRN
  Administered 2020-12-14: 100 mL via INTRAVENOUS

## 2021-01-12 NOTE — Progress Notes (Signed)
HPI: FU atrial fibrillation. She has a hx of Sjogren's syndrome, seizure disorder, HTN, HL, peripheral neuropathy. She was admitted 9/14 after presenting with overall functional decline, cough and generalized weakness and malaise. She was found to be in atrial fibrillation with RVR. CHADS2-VASc=4. She was placed on Xarelto for anticoagulation. She converted to sinus rhythm. MRI in October of 2014 showed a very small acute/subacute left thalamic infarct. Patient found to be in recurrent atrial fibrillation 8/16. Nuclear study 8/16 showed EF 65 and no ischemia. Patient declined cardioversion previously and her amiodarone was discontinued. Holter monitor February 2017 showed atrial fibrillation with PVCs or aberrantly conducted beats rate controlled. Showed 1-39% bilateral stenosis. Echocardiogram March 2019 showed normal LV function, mild mitral regurgitation, moderate left atrial enlargement, mild right ventricular enlargement. Since last seen, there is no dyspnea, chest pain, palpitations or syncope.  No bleeding.  No recent falls.  Current Outpatient Medications  Medication Sig Dispense Refill   acetaminophen (TYLENOL) 325 MG tablet Take 650 mg by mouth every 6 (six) hours as needed for moderate pain.     diltiazem (CARDIZEM CD) 180 MG 24 hr capsule TAKE 1 CAPSULE BY MOUTH EVERY DAY 90 capsule 1   diphenhydramine-acetaminophen (TYLENOL PM) 25-500 MG TABS tablet Take 2 tablets by mouth at bedtime as needed (sleep).     dorzolamide-timolol (COSOPT) 22.3-6.8 MG/ML ophthalmic solution Place 1 drop into both eyes 2 (two) times daily.     ELIQUIS 5 MG TABS tablet TAKE 1 TABLET BY MOUTH TWICE A DAY (Patient taking differently: Take 5 mg by mouth 2 (two) times daily.) 180 tablet 1   hydroxypropyl methylcellulose / hypromellose (ISOPTO TEARS / GONIOVISC) 2.5 % ophthalmic solution Place 1 drop into both eyes 3 (three) times daily as needed for dry eyes.     ondansetron (ZOFRAN) 8 MG tablet Take 8 mg by  mouth every 6 (six) hours as needed for vomiting or nausea.  0   pregabalin (LYRICA) 100 MG capsule TAKE 1 CAPSULE BY MOUTH TWICE A DAY 60 capsule 5   sucralfate (CARAFATE) 1 g tablet Take 1 g by mouth 2 (two) times daily.     No current facility-administered medications for this visit.     Past Medical History:  Diagnosis Date   Acute respiratory failure with hypoxia ([place]) 12/06/2012   Allergy    Blindness of right eye    Complication of anesthesia    Dehydration with hyponatremia 12/07/2012   Diabetes mellitus without complication (HCC)    diagnosed 2 weeks ago- No CBG meter as of yet.   DIVERTICULITIS, HX OF 01/27/2009   Qualifier: Diagnosis of  By: Jerold Coombe     Dysrhythmia    Afib   Gait disorder 06/05/2012   GERD (gastroesophageal reflux disease)    Glaucoma    Glucose intolerance (impaired glucose tolerance)    History of diverticulitis    Hypertension    Legally blind    Migraine triggered seizures (HCC)    history of   Migraines    Neuropathy    OP (osteoporosis)    Osteoarthritis    osteoarthritis -right knee. uses walker. Left shoulder -"Limited ROM"   Paroxysmal atrial fibrillation ([place]) 12/06/2012   Echo (12/07/12): Moderate focal basal hypertrophy of the septum, vigorous LV function, EF 65-70%, indeterminate diastolic function, normal wall motion, trivial MR, moderate LAE, trivial TR;  Xarelto started 11/2012   Peripheral neuropathy    neuropathy   Pneumonia    PONV (postoperative  nausea and vomiting)    no problems other past few surgeries   SHINGLES, HX OF 07/17/2009   Qualifier: Diagnosis of  By: Jerold Coombe     Siriasis    Sjogren's syndrome ([place])    bilateral vision   Sleep apnea    does not wear CPAP   Stroke ([place])    residual - rare Intermittent expressive aphasia,    Past Surgical History:  Procedure Laterality Date   BONE TUMOR EXCISION Right    arm   CATARACT EXTRACTION     CHOLECYSTECTOMY N/A 08/01/2020   Procedure: LAPAROSCOPIC  CHOLECYSTECTOMY;  Surgeon: Clovis Riley, MD;  Location: [facility] ORS;  Service: General;  Laterality: N/A;   COLONOSCOPY WITH PROPOFOL N/A 06/09/2015   Procedure: COLONOSCOPY WITH PROPOFOL;  Surgeon: Ronald Lobo, MD;  Location: [facility] ENDOSCOPY;  Service: Endoscopy;  Laterality: N/A;   DILATION AND CURETTAGE OF UTERUS     ESOPHAGOGASTRODUODENOSCOPY N/A 04/17/2016   Procedure: ESOPHAGOGASTRODUODENOSCOPY (EGD);  Surgeon: Teena Irani, MD;  Location: [facility] ENDOSCOPY;  Service: Endoscopy;  Laterality: N/A;   ESOPHAGOGASTRODUODENOSCOPY (EGD) WITH PROPOFOL N/A 06/09/2015   Procedure: ESOPHAGOGASTRODUODENOSCOPY (EGD) WITH PROPOFOL;  Surgeon: Ronald Lobo, MD;  Location: [facility] ENDOSCOPY;  Service: Endoscopy;  Laterality: N/A;   EYE SURGERY     FOOT FRACTURE SURGERY Left 2007   GIVENS CAPSULE STUDY N/A 04/17/2016   Procedure: GIVENS CAPSULE STUDY;  Surgeon: Teena Irani, MD;  Location: [facility];  Service: Endoscopy;  Laterality: N/A;   KNEE ARTHROSCOPY Right    LUMBAR LAMINECTOMY     ROBOT ASSISTED LAPAROSCOPIC PARTIAL COLECTOMY  02/07/2016   Procedure: ROBOT ASSISTED LAPAROSCOPIC PARTIAL COLECTOMY;  Surgeon: Leighton Ruff, MD;  Location: [facility]S;  Service: General;;   SHOULDER OPEN ROTATOR CUFF REPAIR     TONSILLECTOMY AND ADENOIDECTOMY     TOTAL ABDOMINAL HYSTERECTOMY W/ BILATERAL SALPINGOOPHORECTOMY      Social History   Socioeconomic History   Marital status: Married    Spouse name: jesse   Number of children: 2   Years of education: college   Highest education level: Not on file  Occupational History   Occupation: retired  Tobacco Use   Smoking status: Former    Types: Cigarettes   Smokeless tobacco: Never   Tobacco comments:    quit smoking before age of 70  Vaping Use   Vaping Use: Never used  Substance and Sexual Activity   Alcohol use: No    Alcohol/week: 0.0 standard drinks   Drug use: No   Sexual activity: Not Currently  Other Topics Concern   Not on file  Social History  Narrative   She lives with husband of 15 years.  They have a one story home.  One daughter.    Right handed    Drinks caffeine    Social Determinants of Health   Financial Resource Strain: Not on file  Food Insecurity: Not on file  Transportation Needs: Not on file  Physical Activity: Not on file  Stress: Not on file  Social Connections: Not on file  Intimate Partner Violence: Not on file    Family History  Problem Relation Age of Onset   Stroke Mother    Migraines Mother    Stomach cancer Father    Sjogren's syndrome Father    Sjogren's syndrome Brother    Sjogren's syndrome Brother    Scleroderma Sister        raynaud's scleroderma   Lymphoma Daughter    Migraines Daughter  Heart disease Other        maternal side   Stroke Other        maternal side, 1st degree female <60    ROS: AM nausea but no fevers or chills, productive cough, hemoptysis, dysphasia, odynophagia, melena, hematochezia, dysuria, hematuria, rash, seizure activity, orthopnea, PND, pedal edema, claudication. Remaining systems are negative.  Physical Exam: Well-developed well-nourished in no acute distress.  Skin is warm and dry.  HEENT is normal.  Neck is supple.  Chest is clear to auscultation with normal expansion.  Cardiovascular exam is irregular Abdominal exam nontender or distended. No masses palpated. Extremities show no edema. neuro grossly intact   A/P  1 permanent atrial fibrillation-we will continue Cardizem at present dose for rate control.  Continue apixaban.  Check hemoglobin and renal function.  2 chronic diastolic congestive heart failure-euvolemic.  Continue fluid restriction and low-sodium diet.  3 hypertension-blood pressure controlled.  Continue present medications.  4 hyperlipidemia-Per primary care.  Kirk Ruths, MD

## 2021-01-15 ENCOUNTER — Ambulatory Visit (INDEPENDENT_AMBULATORY_CARE_PROVIDER_SITE_OTHER): Payer: Medicare Other | Admitting: Cardiology

## 2021-01-15 ENCOUNTER — Encounter: Payer: Self-pay | Admitting: Cardiology

## 2021-01-15 ENCOUNTER — Other Ambulatory Visit: Payer: Self-pay

## 2021-01-15 VITALS — BP 120/74 | HR 81 | Ht 65.0 in | Wt 165.0 lb

## 2021-01-15 DIAGNOSIS — I5032 Chronic diastolic (congestive) heart failure: Secondary | ICD-10-CM | POA: Diagnosis not present

## 2021-01-15 DIAGNOSIS — E782 Mixed hyperlipidemia: Secondary | ICD-10-CM

## 2021-01-15 DIAGNOSIS — I10 Essential (primary) hypertension: Secondary | ICD-10-CM

## 2021-01-15 DIAGNOSIS — I4821 Permanent atrial fibrillation: Secondary | ICD-10-CM

## 2021-01-15 NOTE — Patient Instructions (Signed)

## 2021-01-16 ENCOUNTER — Ambulatory Visit: Payer: Medicare Other | Admitting: Cardiology

## 2021-01-16 LAB — CBC
Hematocrit: 48.4 % — ABNORMAL HIGH (ref 34.0–46.6)
Hemoglobin: 16 g/dL — ABNORMAL HIGH (ref 11.1–15.9)
MCH: 30.2 pg (ref 26.6–33.0)
MCHC: 33.1 g/dL (ref 31.5–35.7)
MCV: 92 fL (ref 79–97)
Platelets: 177 10*3/uL (ref 150–450)
RBC: 5.29 x10E6/uL — ABNORMAL HIGH (ref 3.77–5.28)
RDW: 14 % (ref 11.7–15.4)
WBC: 7.5 10*3/uL (ref 3.4–10.8)

## 2021-01-16 LAB — BASIC METABOLIC PANEL
BUN/Creatinine Ratio: 15 (ref 12–28)
BUN: 10 mg/dL (ref 8–27)
CO2: 25 mmol/L (ref 20–29)
Calcium: 10.3 mg/dL (ref 8.7–10.3)
Chloride: 94 mmol/L — ABNORMAL LOW (ref 96–106)
Creatinine, Ser: 0.68 mg/dL (ref 0.57–1.00)
Glucose: 119 mg/dL — ABNORMAL HIGH (ref 70–99)
Potassium: 4.4 mmol/L (ref 3.5–5.2)
Sodium: 137 mmol/L (ref 134–144)
eGFR: 86 mL/min/{1.73_m2} (ref >59)

## 2021-01-22 ENCOUNTER — Encounter: Payer: Self-pay | Admitting: *Deleted

## 2021-01-23 DIAGNOSIS — R110 Nausea: Secondary | ICD-10-CM | POA: Diagnosis not present

## 2021-01-23 DIAGNOSIS — R198 Other specified symptoms and signs involving the digestive system and abdomen: Secondary | ICD-10-CM | POA: Diagnosis not present

## 2021-01-23 DIAGNOSIS — K2960 Other gastritis without bleeding: Secondary | ICD-10-CM | POA: Diagnosis not present

## 2021-02-20 ENCOUNTER — Ambulatory Visit
Admission: RE | Admit: 2021-02-20 | Discharge: 2021-02-20 | Disposition: A | Discharge status: Home / Self Care | Payer: Medicare Other | Source: Ambulatory Visit | Attending: Family Medicine | Admitting: Family Medicine

## 2021-02-20 ENCOUNTER — Other Ambulatory Visit: Payer: Self-pay | Admitting: Family Medicine

## 2021-02-20 ENCOUNTER — Other Ambulatory Visit: Payer: Self-pay

## 2021-02-20 DIAGNOSIS — E785 Hyperlipidemia, unspecified: Secondary | ICD-10-CM | POA: Diagnosis not present

## 2021-02-20 DIAGNOSIS — S8001XA Contusion of right knee, initial encounter: Secondary | ICD-10-CM

## 2021-02-20 DIAGNOSIS — E538 Deficiency of other specified B group vitamins: Secondary | ICD-10-CM | POA: Diagnosis not present

## 2021-02-20 DIAGNOSIS — E1143 Type 2 diabetes mellitus with diabetic autonomic (poly)neuropathy: Secondary | ICD-10-CM | POA: Diagnosis not present

## 2021-02-20 DIAGNOSIS — E559 Vitamin D deficiency, unspecified: Secondary | ICD-10-CM | POA: Diagnosis not present

## 2021-02-20 DIAGNOSIS — R109 Unspecified abdominal pain: Secondary | ICD-10-CM | POA: Diagnosis not present

## 2021-02-20 DIAGNOSIS — G8929 Other chronic pain: Secondary | ICD-10-CM | POA: Diagnosis not present

## 2021-02-20 DIAGNOSIS — M7989 Other specified soft tissue disorders: Secondary | ICD-10-CM | POA: Diagnosis not present

## 2021-02-22 DIAGNOSIS — M1711 Unilateral primary osteoarthritis, right knee: Secondary | ICD-10-CM | POA: Diagnosis not present

## 2021-02-22 DIAGNOSIS — M25561 Pain in right knee: Secondary | ICD-10-CM | POA: Diagnosis not present

## 2021-02-22 DIAGNOSIS — S8001XA Contusion of right knee, initial encounter: Secondary | ICD-10-CM | POA: Diagnosis not present

## 2021-03-14 ENCOUNTER — Other Ambulatory Visit: Payer: Self-pay | Admitting: Neurology

## 2021-03-19 ENCOUNTER — Inpatient Hospital Stay (HOSPITAL_COMMUNITY)
Admission: EM | Admit: 2021-03-19 | Discharge: 2021-03-26 | DRG: 065 | Disposition: A | Discharge status: Skilled Nursing Facility | Payer: Medicare HMO | Attending: Family Medicine | Admitting: Family Medicine

## 2021-03-19 ENCOUNTER — Emergency Department (HOSPITAL_COMMUNITY): Payer: Medicare HMO

## 2021-03-19 DIAGNOSIS — E871 Hypo-osmolality and hyponatremia: Secondary | ICD-10-CM | POA: Diagnosis present

## 2021-03-19 DIAGNOSIS — S9001XA Contusion of right ankle, initial encounter: Secondary | ICD-10-CM | POA: Diagnosis present

## 2021-03-19 DIAGNOSIS — Z888 Allergy status to other drugs, medicaments and biological substances status: Secondary | ICD-10-CM | POA: Diagnosis not present

## 2021-03-19 DIAGNOSIS — I6389 Other cerebral infarction: Secondary | ICD-10-CM | POA: Diagnosis not present

## 2021-03-19 DIAGNOSIS — L258 Unspecified contact dermatitis due to other agents: Secondary | ICD-10-CM | POA: Diagnosis present

## 2021-03-19 DIAGNOSIS — R29810 Facial weakness: Secondary | ICD-10-CM | POA: Diagnosis present

## 2021-03-19 DIAGNOSIS — Z885 Allergy status to narcotic agent status: Secondary | ICD-10-CM | POA: Diagnosis not present

## 2021-03-19 DIAGNOSIS — Z7901 Long term (current) use of anticoagulants: Secondary | ICD-10-CM

## 2021-03-19 DIAGNOSIS — R471 Dysarthria and anarthria: Secondary | ICD-10-CM | POA: Diagnosis present

## 2021-03-19 DIAGNOSIS — G936 Cerebral edema: Secondary | ICD-10-CM

## 2021-03-19 DIAGNOSIS — Z9049 Acquired absence of other specified parts of digestive tract: Secondary | ICD-10-CM | POA: Diagnosis not present

## 2021-03-19 DIAGNOSIS — K219 Gastro-esophageal reflux disease without esophagitis: Secondary | ICD-10-CM | POA: Diagnosis present

## 2021-03-19 DIAGNOSIS — R29706 NIHSS score 6: Secondary | ICD-10-CM | POA: Diagnosis present

## 2021-03-19 DIAGNOSIS — R414 Neurologic neglect syndrome: Secondary | ICD-10-CM | POA: Diagnosis present

## 2021-03-19 DIAGNOSIS — I610 Nontraumatic intracerebral hemorrhage in hemisphere, subcortical: Principal | ICD-10-CM

## 2021-03-19 DIAGNOSIS — Z20822 Contact with and (suspected) exposure to covid-19: Secondary | ICD-10-CM | POA: Diagnosis present

## 2021-03-19 DIAGNOSIS — Z8673 Personal history of transient ischemic attack (TIA), and cerebral infarction without residual deficits: Secondary | ICD-10-CM

## 2021-03-19 DIAGNOSIS — M3506 Sjogren syndrome with peripheral nervous system involvement: Secondary | ICD-10-CM | POA: Diagnosis not present

## 2021-03-19 DIAGNOSIS — R52 Pain, unspecified: Secondary | ICD-10-CM

## 2021-03-19 DIAGNOSIS — E861 Hypovolemia: Secondary | ICD-10-CM | POA: Diagnosis present

## 2021-03-19 DIAGNOSIS — Z823 Family history of stroke: Secondary | ICD-10-CM

## 2021-03-19 DIAGNOSIS — Z800 Family history of malignant neoplasm of digestive organs: Secondary | ICD-10-CM

## 2021-03-19 DIAGNOSIS — E1140 Type 2 diabetes mellitus with diabetic neuropathy, unspecified: Secondary | ICD-10-CM | POA: Diagnosis present

## 2021-03-19 DIAGNOSIS — H409 Unspecified glaucoma: Secondary | ICD-10-CM | POA: Diagnosis present

## 2021-03-19 DIAGNOSIS — M3500 Sicca syndrome, unspecified: Secondary | ICD-10-CM | POA: Diagnosis present

## 2021-03-19 DIAGNOSIS — I4819 Other persistent atrial fibrillation: Secondary | ICD-10-CM | POA: Diagnosis present

## 2021-03-19 DIAGNOSIS — Z807 Family history of other malignant neoplasms of lymphoid, hematopoietic and related tissues: Secondary | ICD-10-CM

## 2021-03-19 DIAGNOSIS — H548 Legal blindness, as defined in USA: Secondary | ICD-10-CM | POA: Diagnosis present

## 2021-03-19 DIAGNOSIS — L8990 Pressure ulcer of unspecified site, unspecified stage: Secondary | ICD-10-CM | POA: Insufficient documentation

## 2021-03-19 DIAGNOSIS — S161XXA Strain of muscle, fascia and tendon at neck level, initial encounter: Secondary | ICD-10-CM | POA: Diagnosis present

## 2021-03-19 DIAGNOSIS — I708 Atherosclerosis of other arteries: Secondary | ICD-10-CM | POA: Diagnosis present

## 2021-03-19 DIAGNOSIS — Y92009 Unspecified place in unspecified non-institutional (private) residence as the place of occurrence of the external cause: Secondary | ICD-10-CM

## 2021-03-19 DIAGNOSIS — I619 Nontraumatic intracerebral hemorrhage, unspecified: Secondary | ICD-10-CM | POA: Diagnosis present

## 2021-03-19 DIAGNOSIS — E1165 Type 2 diabetes mellitus with hyperglycemia: Secondary | ICD-10-CM | POA: Diagnosis present

## 2021-03-19 DIAGNOSIS — L24A2 Irritant contact dermatitis due to fecal, urinary or dual incontinence: Secondary | ICD-10-CM | POA: Diagnosis present

## 2021-03-19 DIAGNOSIS — E785 Hyperlipidemia, unspecified: Secondary | ICD-10-CM | POA: Diagnosis present

## 2021-03-19 DIAGNOSIS — W19XXXA Unspecified fall, initial encounter: Secondary | ICD-10-CM | POA: Diagnosis present

## 2021-03-19 DIAGNOSIS — R609 Edema, unspecified: Secondary | ICD-10-CM

## 2021-03-19 DIAGNOSIS — I10 Essential (primary) hypertension: Secondary | ICD-10-CM | POA: Diagnosis present

## 2021-03-19 DIAGNOSIS — R32 Unspecified urinary incontinence: Secondary | ICD-10-CM | POA: Diagnosis present

## 2021-03-19 DIAGNOSIS — Z87891 Personal history of nicotine dependence: Secondary | ICD-10-CM

## 2021-03-19 LAB — DIFFERENTIAL
Abs Immature Granulocytes: 0.07 10*3/uL (ref 0.00–0.07)
Basophils Absolute: 0 10*3/uL (ref 0.0–0.1)
Basophils Relative: 0 %
Eosinophils Absolute: 0.1 10*3/uL (ref 0.0–0.5)
Eosinophils Relative: 1 %
Immature Granulocytes: 1 %
Lymphocytes Relative: 14 %
Lymphs Abs: 1.8 10*3/uL (ref 0.7–4.0)
Monocytes Absolute: 0.9 10*3/uL (ref 0.1–1.0)
Monocytes Relative: 7 %
Neutro Abs: 10.3 10*3/uL — ABNORMAL HIGH (ref 1.7–7.7)
Neutrophils Relative %: 77 %

## 2021-03-19 LAB — CBG MONITORING, ED: Glucose-Capillary: 164 mg/dL — ABNORMAL HIGH (ref 70–99)

## 2021-03-19 LAB — COMPREHENSIVE METABOLIC PANEL
ALT: 11 U/L (ref 0–44)
AST: 24 U/L (ref 15–41)
Albumin: 3.7 g/dL (ref 3.5–5.0)
Alkaline Phosphatase: 76 U/L (ref 38–126)
Anion gap: 10 (ref 5–15)
BUN: 9 mg/dL (ref 8–23)
CO2: 24 mmol/L (ref 22–32)
Calcium: 9.8 mg/dL (ref 8.9–10.3)
Chloride: 96 mmol/L — ABNORMAL LOW (ref 98–111)
Creatinine, Ser: 0.78 mg/dL (ref 0.44–1.00)
GFR, Estimated: >60 mL/min (ref >60)
Glucose, Bld: 162 mg/dL — ABNORMAL HIGH (ref 70–99)
Potassium: 4 mmol/L (ref 3.5–5.1)
Sodium: 130 mmol/L — ABNORMAL LOW (ref 135–145)
Total Bilirubin: 0.9 mg/dL (ref 0.3–1.2)
Total Protein: 6.4 g/dL — ABNORMAL LOW (ref 6.5–8.1)

## 2021-03-19 LAB — PROTIME-INR
INR: 1.1 (ref 0.8–1.2)
Prothrombin Time: 14 seconds (ref 11.4–15.2)

## 2021-03-19 LAB — APTT: aPTT: 28 seconds (ref 24–36)

## 2021-03-19 LAB — I-STAT CHEM 8, ED
BUN: 9 mg/dL (ref 8–23)
Calcium, Ion: 1.08 mmol/L — ABNORMAL LOW (ref 1.15–1.40)
Chloride: 96 mmol/L — ABNORMAL LOW (ref 98–111)
Creatinine, Ser: 0.6 mg/dL (ref 0.44–1.00)
Glucose, Bld: 154 mg/dL — ABNORMAL HIGH (ref 70–99)
HCT: 48 % — ABNORMAL HIGH (ref 36.0–46.0)
Hemoglobin: 16.3 g/dL — ABNORMAL HIGH (ref 12.0–15.0)
Potassium: 4 mmol/L (ref 3.5–5.1)
Sodium: 129 mmol/L — ABNORMAL LOW (ref 135–145)
TCO2: 24 mmol/L (ref 22–32)

## 2021-03-19 LAB — CBC
HCT: 46.3 % — ABNORMAL HIGH (ref 36.0–46.0)
Hemoglobin: 15.1 g/dL — ABNORMAL HIGH (ref 12.0–15.0)
MCH: 30.6 pg (ref 26.0–34.0)
MCHC: 32.6 g/dL (ref 30.0–36.0)
MCV: 93.7 fL (ref 80.0–100.0)
Platelets: 167 10*3/uL (ref 150–400)
RBC: 4.94 MIL/uL (ref 3.87–5.11)
RDW: 13.3 % (ref 11.5–15.5)
WBC: 13.2 10*3/uL — ABNORMAL HIGH (ref 4.0–10.5)
nRBC: 0 % (ref 0.0–0.2)

## 2021-03-19 MED ORDER — DORZOLAMIDE HCL-TIMOLOL MAL 2-0.5 % OP SOLN
1.0000 [drp] | Freq: Two times a day (BID) | OPHTHALMIC | Status: DC
  Administered 2021-03-20 – 2021-03-26 (×13): 1 [drp] via OPHTHALMIC
  Filled 2021-03-19: qty 10

## 2021-03-19 MED ORDER — STROKE: EARLY STAGES OF RECOVERY BOOK
Freq: Once | Status: AC
  Filled 2021-03-19: qty 1

## 2021-03-19 MED ORDER — ACETAMINOPHEN 325 MG PO TABS
650.0000 mg | ORAL_TABLET | ORAL | Status: DC | PRN
  Administered 2021-03-20 – 2021-03-25 (×13): 650 mg via ORAL
  Filled 2021-03-19 (×13): qty 2

## 2021-03-19 MED ORDER — PREGABALIN 100 MG PO CAPS
100.0000 mg | ORAL_CAPSULE | Freq: Two times a day (BID) | ORAL | Status: DC
  Administered 2021-03-20 – 2021-03-26 (×13): 100 mg via ORAL
  Filled 2021-03-19: qty 1
  Filled 2021-03-19: qty 2
  Filled 2021-03-19 (×4): qty 1
  Filled 2021-03-19: qty 2
  Filled 2021-03-19 (×2): qty 1
  Filled 2021-03-19: qty 2
  Filled 2021-03-19 (×3): qty 1

## 2021-03-19 MED ORDER — EMPTY CONTAINERS FLEXIBLE MISC
900.0000 mg | Freq: Once | Status: AC
  Administered 2021-03-19: 900 mg via INTRAVENOUS
  Filled 2021-03-19: qty 90

## 2021-03-19 MED ORDER — SODIUM CHLORIDE 0.9 % IV SOLN: INTRAVENOUS | Status: AC

## 2021-03-19 MED ORDER — ACETAMINOPHEN 650 MG RE SUPP
650.0000 mg | RECTAL | Status: DC | PRN
  Administered 2021-03-20: 650 mg via RECTAL
  Filled 2021-03-19: qty 1

## 2021-03-19 MED ORDER — SENNOSIDES-DOCUSATE SODIUM 8.6-50 MG PO TABS
1.0000 | ORAL_TABLET | Freq: Two times a day (BID) | ORAL | Status: DC
  Administered 2021-03-20 – 2021-03-23 (×7): 1 via ORAL
  Filled 2021-03-19 (×9): qty 1

## 2021-03-19 MED ORDER — CLEVIDIPINE BUTYRATE 0.5 MG/ML IV EMUL
0.0000 mg/h | INTRAVENOUS | Status: DC
  Administered 2021-03-20: 1 mg/h via INTRAVENOUS
  Administered 2021-03-20: 2 mg/h via INTRAVENOUS
  Filled 2021-03-19 (×3): qty 50

## 2021-03-19 MED ORDER — SODIUM CHLORIDE 0.9% FLUSH: 3.0000 mL | Freq: Once | INTRAVENOUS | Status: DC

## 2021-03-19 MED ORDER — ACETAMINOPHEN 160 MG/5ML PO SOLN: 650.0000 mg | ORAL | Status: DC | PRN

## 2021-03-19 MED ORDER — PANTOPRAZOLE SODIUM 40 MG IV SOLR
40.0000 mg | Freq: Every day | INTRAVENOUS | Status: DC
  Administered 2021-03-20: 40 mg via INTRAVENOUS
  Filled 2021-03-19: qty 40

## 2021-03-19 MED ORDER — LABETALOL HCL 5 MG/ML IV SOLN
20.0000 mg | Freq: Once | INTRAVENOUS | Status: AC
  Administered 2021-03-20: 20 mg via INTRAVENOUS
  Filled 2021-03-19: qty 4

## 2021-03-19 MED ORDER — POLYVINYL ALCOHOL 1.4 % OP SOLN
1.0000 [drp] | Freq: Three times a day (TID) | OPHTHALMIC | Status: DC | PRN
  Filled 2021-03-19: qty 15

## 2021-03-19 NOTE — H&P (Signed)
NEUROLOGY CONSULTATION NOTE   Date of service: March 19, 2021 Patient Name: Krystal Clarke MRN:  [id number] DOB:  1936-02-04 Requesting Provider: Merryl Hacker, MD _ _ _   _ __   _ __ _ _  __ __   _ __   __ _  History of Present Illness  Krystal Clarke is a 86 y.o. female with PMH significant for glaucoma with resultant blindness, DM2 with neuropathy, sjogren's syndrome, Afibb on eliquis with her last dose on 03/19/21 in AM, prior stroke, headaches, GERD, HTN who presents with a fall where she did not hit her head and was noted to have R gaze deviation along with left facial droop. This happened at 2145. Family called EMS.  Not unusual for her to fall, last month had a pretty bad fall and fell of the chair that she uses to dress and undress. In the past has hit her head on the nightstand.  CTH w/o contrast demonstrated right basal ganglia ICH about 43ml in volume.  LKW: 2145 on 03/19/21. mRS: 3 tNKASE/thrombectomy: not offered due to [place] ICH score: 1 NIHSS components Score: Comment  1a Level of Conscious 0[x]  1[]  2[]  3[]      1b LOC Questions 0[x]  1[]  2[]       1c LOC Commands 0[x]  1[]  2[]       2 Best Gaze 0[]  1[]  2[x]       3 Visual 0[]  1[]  2[x]  3[]      4 Facial Palsy 0[]  1[x]  2[]  3[]      5a Motor Arm - left 0[x]  1[]  2[]  3[]  4[]  UN[]    5b Motor Arm - Right 0[x]  1[]  2[]  3[]  4[]  UN[]    6a Motor Leg - Left 0[x]  1[]  2[]  3[]  4[]  UN[]    6b Motor Leg - Right 0[x]  1[]  2[]  3[]  4[]  UN[]    7 Limb Ataxia 0[x]  1[]  2[]  3[]  UN[]     8 Sensory 0[x]  1[]  2[]  UN[]      9 Best Language 0[x]  1[]  2[]  3[]      10 Dysarthria 0[]  1[x]  2[]  UN[]      11 Extinct. and Inattention 0[x]  1[]  2[]       TOTAL: 6      ROS   Constitutional Denies weight loss, fever and chills.  HEENT Endorses worsening vision deficit(feels left eye was better before this happened)and hearing.  Respiratory Denies SOB and cough.  CV Denies palpitations and CP  GI Denies abdominal pain, nausea, vomiting and diarrhea.  GU Denies  dysuria and urinary frequency.  MSK Denies myalgia and joint pain.  Skin Denies rash and pruritus.  Neurological Denies headache and syncope.  Psychiatric Denies recent changes in mood. Denies anxiety and depression.   Past History   Past Medical History:  Diagnosis Date   Acute respiratory failure with hypoxia ([place]) 12/06/2012   Allergy    Blindness of right eye    Complication of anesthesia    Dehydration with hyponatremia 12/07/2012   Diabetes mellitus without complication (HCC)    diagnosed 2 weeks ago- No CBG meter as of yet.   DIVERTICULITIS, HX OF 01/27/2009   Qualifier: Diagnosis of  By: Jerold Coombe     Dysrhythmia    Afib   Gait disorder 06/05/2012   GERD (gastroesophageal reflux disease)    Glaucoma    Glucose intolerance (impaired glucose tolerance)    History of diverticulitis    Hypertension    Legally blind    Migraine triggered seizures ([place])    history of  Migraines    Neuropathy    OP (osteoporosis)    Osteoarthritis    osteoarthritis -right knee. uses walker. Left shoulder -"Limited ROM"   Paroxysmal atrial fibrillation ([place]) 12/06/2012   Echo (12/07/12): Moderate focal basal hypertrophy of the septum, vigorous LV function, EF 65-70%, indeterminate diastolic function, normal wall motion, trivial MR, moderate LAE, trivial TR;  Xarelto started 11/2012   Peripheral neuropathy    neuropathy   Pneumonia    PONV (postoperative nausea and vomiting)    no problems other past few surgeries   SHINGLES, HX OF 07/17/2009   Qualifier: Diagnosis of  By: Jerold Coombe     Siriasis    Sjogren's syndrome ([place])    bilateral vision   Sleep apnea    does not wear CPAP   Stroke ([place])    residual - rare Intermittent expressive aphasia,   Past Surgical History:  Procedure Laterality Date   BONE TUMOR EXCISION Right    arm   CATARACT EXTRACTION     CHOLECYSTECTOMY N/A 08/01/2020   Procedure: LAPAROSCOPIC CHOLECYSTECTOMY;  Surgeon: Clovis Riley, MD;  Location:  [facility]S;  Service: General;  Laterality: N/A;   COLONOSCOPY WITH PROPOFOL N/A 06/09/2015   Procedure: COLONOSCOPY WITH PROPOFOL;  Surgeon: Ronald Lobo, MD;  Location: [facility] ENDOSCOPY;  Service: Endoscopy;  Laterality: N/A;   DILATION AND CURETTAGE OF UTERUS     ESOPHAGOGASTRODUODENOSCOPY N/A 04/17/2016   Procedure: ESOPHAGOGASTRODUODENOSCOPY (EGD);  Surgeon: Teena Irani, MD;  Location: [facility] ENDOSCOPY;  Service: Endoscopy;  Laterality: N/A;   ESOPHAGOGASTRODUODENOSCOPY (EGD) WITH PROPOFOL N/A 06/09/2015   Procedure: ESOPHAGOGASTRODUODENOSCOPY (EGD) WITH PROPOFOL;  Surgeon: Ronald Lobo, MD;  Location: [facility] ENDOSCOPY;  Service: Endoscopy;  Laterality: N/A;   EYE SURGERY     FOOT FRACTURE SURGERY Left 2007   GIVENS CAPSULE STUDY N/A 04/17/2016   Procedure: GIVENS CAPSULE STUDY;  Surgeon: Teena Irani, MD;  Location: [place];  Service: Endoscopy;  Laterality: N/A;   KNEE ARTHROSCOPY Right    LUMBAR LAMINECTOMY     ROBOT ASSISTED LAPAROSCOPIC PARTIAL COLECTOMY  02/07/2016   Procedure: ROBOT ASSISTED LAPAROSCOPIC PARTIAL COLECTOMY;  Surgeon: Leighton Ruff, MD;  Location: [facility];  Service: General;;   SHOULDER OPEN ROTATOR CUFF REPAIR     TONSILLECTOMY AND ADENOIDECTOMY     TOTAL ABDOMINAL HYSTERECTOMY W/ BILATERAL SALPINGOOPHORECTOMY     Family History  Problem Relation Age of Onset   Stroke Mother    Migraines Mother    Stomach cancer Father    Sjogren's syndrome Father    Sjogren's syndrome Brother    Sjogren's syndrome Brother    Scleroderma Sister        raynaud's scleroderma   Lymphoma Daughter    Migraines Daughter    Heart disease Other        maternal side   Stroke Other        maternal side, 1st degree female <60   Social History   Socioeconomic History   Marital status: Married    Spouse name: jesse   Number of children: 2   Years of education: college   Highest education level: Not on file  Occupational History   Occupation: retired  Tobacco Use   Smoking status:  Former    Types: Cigarettes   Smokeless tobacco: Never   Tobacco comments:    quit smoking before age of 86  Vaping Use   Vaping Use: Never used  Substance and Sexual Activity   Alcohol use: No    Alcohol/week:  0.0 standard drinks   Drug use: No   Sexual activity: Not Currently  Other Topics Concern   Not on file  Social History Narrative   She lives with husband of 55 years.  They have a one story home.  One daughter.    Right handed    Drinks caffeine    Social Determinants of Health   Financial Resource Strain: Not on file  Food Insecurity: Not on file  Transportation Needs: Not on file  Physical Activity: Not on file  Stress: Not on file  Social Connections: Not on file   Allergies  Allergen Reactions   Linaclotide Diarrhea   Oxycodone-Aspirin Nausea And Vomiting   Codeine Nausea And Vomiting and Other (See Comments)   Hydrocodone Nausea And Vomiting   Morphine And Related Nausea And Vomiting   Oxycodone Nausea And Vomiting   Pilocarpine Other (See Comments)    Reaction:  Unknown    Rivaroxaban Other (See Comments)    Reaction:  Back pain/headaches     Medications  (Not in a hospital admission)    Vitals   Vitals:   03/19/21 2324  BP: (!) 145/93  Pulse: 94  Resp: 14  SpO2: 100%     There is no height or weight on file to calculate BMI.  Physical Exam   General: Laying comfortably in bed; in no acute distress.  HENT: Normal oropharynx and mucosa. Normal external appearance of ears and nose.  Neck: Supple, no pain or tenderness  CV: No JVD. No peripheral edema.  Pulmonary: Symmetric Chest rise. Normal respiratory effort.  Abdomen: Soft to touch, non-tender.  Ext: No cyanosis, edema, or deformity  Skin: No rash. Normal palpation of skin.   Musculoskeletal: Normal digits and nails by inspection. No clubbing.   Neurologic Examination  Mental status/Cognition: Alert, oriented to self, place, month and year, good attention.  Speech/language:  dysarthric speech, fluent, comprehension intact, object naming intact, repetition intact.  Cranial nerves:   CN II Left pupil is larger than right, both pupils are reactive to light however Right is sluggish. severe visual impairment BL   CN III,IV,VI R gaze deviation and unable to cross midline, no nystagmus   CN V normal sensation in V1, V2, and V3 segments bilaterally   CN VII L facial droop   CN VIII normal hearing to speech   CN IX & X normal palatal elevation, no uvular deviation   CN XI 5/5 head turn and 5/5 shoulder shrug bilaterally   CN XII midline tongue protrusion   Motor:  Muscle bulk: poor, tone normal, pronator drift: Yes, LUE drift. tremor none Mvmt Root Nerve  Muscle Right Left Comments  SA C5/6 Ax Deltoid     EF C5/6 Mc Biceps 5 5   EE C6/7/8 Rad Triceps 5 5   WF C6/7 Med FCR     WE C7/8 PIN ECU     F Ab C8/T1 U ADM/FDI 5 4+   HF L1/2/3 Fem Illopsoas 4+ 4   KE L2/3/4 Fem Quad     DF L4/5 D Peron Tib Ant 5 4   PF S1/2 Tibial Grc/Sol 5 4    Reflexes:  Right Left Comments  Pectoralis      Biceps (C5/6) 2 2   Brachioradialis (C5/6) 2 2    Triceps (C6/7) 2 2    Patellar (L3/4) 2 2    Achilles (S1)      Hoffman      Plantar     Jaw  jerk    Sensation:  Light touch Intact throughout   Pin prick    Temperature    Vibration   Proprioception    Coordination/Complex Motor:  - Finger to Nose intact BL - Heel to shin unable to do. - Rapid alternating movement are slowed throughout - Gait: unsafe to assess given ICH  Labs   CBC:  Recent Labs  Lab 03/19/21 2300 03/19/21 2308  WBC 13.2*  --   NEUTROABS 10.3*  --   HGB 15.1* 16.3*  HCT 46.3* 48.0*  MCV 93.7  --   PLT 167  --     Basic Metabolic Panel:  Lab Results  Component Value Date   NA 129 (L) 03/19/2021   K 4.0 03/19/2021   CO2 25 01/15/2021   GLUCOSE 154 (H) 03/19/2021   BUN 9 03/19/2021   CREATININE 0.60 03/19/2021   CALCIUM 10.3 01/15/2021   GFRNONAA >60 07/28/2020   GFRAA 72  08/12/2018   Lipid Panel:  Lab Results  Component Value Date   LDLCALC 56 07/17/2009   HgbA1c:  Lab Results  Component Value Date   HGBA1C 5.6 07/28/2020   Urine Drug Screen: No results found for: LABOPIA, COCAINSCRNUR, LABBENZ, AMPHETMU, THCU, LABBARB  Alcohol Level No results found for: [place]  CT Head without contrast(Personally reviewed): 1. 3.5 x 2.8 x 3.2 cm (estimated volume 16 mL) acute intraparenchymal hemorrhage centered at the right lentiform nucleus/external capsule. Mild surrounding edema without significant midline shift. 2. Underlying age-related cerebral atrophy with moderately advanced chronic microvascular ischemic disease.  MRI Brain: pending  Impression   Krystal Clarke is a 86 y.o. female with PMH significant for glaucoma with resultant blindness, DM2, sjogren's syndrome, Afibb on eliquis with her last dose on 03/19/21 in AM, prior stroke, headaches, GERD, HTN who presents with a fall where she did not hit her head and was noted to have R gaze deviation along with left facial droop. Found to have a R basal ganglia ICH  Primary Diagnosis:  Right Basal Ganglia Intracerebral Hemorrhage in the setting of Anticoagulation with Apixaban with associated Cerebral edema and an ICH score of 1.  Secondary Diagnosis: Cerebral edema, Essential (primary) hypertension, Paroxysmal atrial fibrillation, and Type 2 diabetes mellitus with hyperglycemia  On apixaban therapy Glaucoma with severe visual impairment.  Recommendations   - Apixaban reversed with Andexa in the ED. - Admit to ICU - Stability scan in 6 hours or STAT with any neurological decline - Frequent neuro checks; q65min for 1 hour, then q1hour - No antiplatelets or anticoagulants due to [place] - SCD for DVT prophylaxis, pharmacological DVT ppx at 24 hours if ICH is stable - Blood pressure control with goal systolic 093 - 267, cleverplex and labetalol PRN - Stroke labs, HgbA1c, fasting lipid panel - MRI brain  with and without contrast when stabilized to evaluate for underlying mass - MRA without contrast of the brain and Vasc US carotid duplex to evaluate for underlying vascular abnormality. - Risk factor modification - Echocardiogram - PT consult, OT consult, Speech consult. - Stroke team to follow  Paroxysmal Atrial fibb: - Hold Eliquis - Hold home Diltiazem.  - Metoprolol 2.5mg  Q6H if HR above 120 overnight.  DM2 with hyperglycemia: - SSI - Carb controlled diet when she passes swallow.  HTN: - hold home meds, goal BP as above.  Glaucoma with severe visual impairment: - continue home eye drops.  Peripheral Neuropathy due to Sjogren's syndrome: - Continue Pregabalin 100mg  BID  Patient's daughter is her  POA. Discussed with daughter over phone and verified code status and patient is FULL CODE.  ______________________________________________________________  This patient is critically ill and at significant risk of neurological worsening, death and care requires constant monitoring of vital signs, hemodynamics,respiratory and cardiac monitoring, neurological assessment, discussion with family, other specialists and medical decision making of high complexity. I spent 90 minutes of neurocritical care time  in the care of  this patient. This was time spent independent of any time provided by nurse practitioner or PA.  Donnetta Simpers [facility] Pager Number [phone] 03/20/2021  12:05 AM   Thank you for the opportunity to take part in the care of this patient. If you have any further questions, please contact the neurology consultation attending.  Signed,  [place] Pager Number [phone] _ _ _   _ __   _ __ _ _  __ __   _ __   __ _

## 2021-03-19 NOTE — ED Triage Notes (Signed)
Pt via [facility] as code stroke, LSN 2130. Per EMS, pt fell approx 2145, -hit head. Pt noted to have R gaze, L facial droop. Pt on eliquis for afib, did not have nightly dose PTA. 18RAC  Cleared at bridge 2302, to CT 2304  145/93 HR 97 CBG 164

## 2021-03-19 NOTE — ED Provider Notes (Addendum)
[facility] EMERGENCY DEPARTMENT Provider Note   CSN: [id number] Arrival date & time: 03/19/21  2300     History  Chief Complaint  Patient presents with   Code Stroke    Krystal Clarke is a 86 y.o. female.  HPI     This is an 86 year old female with a history of atrial fibrillation on Eliquis, hypertension, blindness, diabetes, stroke who presents as a code stroke.  Patient is unable to provide history.  Last seen normal 2130.  Per EMS, she did fall around 2145.  She reportedly did not hit her head.  Patient was noted by EMS to have a right gaze preference, left-sided neglect, left facial droop.  Vital signs stable in route.  Patient is unable to provide any history.  Only collateral information is from EMS.  Level 5 caveat for acuity of condition.  Home Medications Prior to Admission medications   Medication Sig Start Date End Date Taking? Authorizing Provider  acetaminophen (TYLENOL) 325 MG tablet Take 650 mg by mouth every 6 (six) hours as needed for moderate pain.    [provider]  diltiazem (CARDIZEM CD) 180 MG 24 hr capsule TAKE 1 CAPSULE BY MOUTH EVERY DAY 10/23/20   Lelon Perla, MD  diphenhydramine-acetaminophen (TYLENOL PM) 25-500 MG TABS tablet Take 2 tablets by mouth at bedtime as needed (sleep).    [provider]  dorzolamide-timolol (COSOPT) 22.3-6.8 MG/ML ophthalmic solution Place 1 drop into both eyes 2 (two) times daily. 10/29/16   [provider]  ELIQUIS 5 MG TABS tablet TAKE 1 TABLET BY MOUTH TWICE A DAY Patient taking differently: Take 5 mg by mouth 2 (two) times daily. 05/09/20   Lelon Perla, MD  hydroxypropyl methylcellulose / hypromellose (ISOPTO TEARS / GONIOVISC) 2.5 % ophthalmic solution Place 1 drop into both eyes 3 (three) times daily as needed for dry eyes.    [provider]  ondansetron (ZOFRAN) 8 MG tablet Take 8 mg by mouth every 6 (six) hours as needed for vomiting or nausea.  07/10/17   [provider]  pregabalin (LYRICA) 100 MG capsule TAKE 1 CAPSULE BY MOUTH TWICE A DAY 03/15/21   Cameron Sprang, MD  sucralfate (CARAFATE) 1 g tablet Take 1 g by mouth 2 (two) times daily.    [provider]      Allergies    Linaclotide, Oxycodone-aspirin, Codeine, Hydrocodone, Morphine and related, Oxycodone, Pilocarpine, and Rivaroxaban    Review of Systems   Review of Systems  Unable to perform ROS: Acuity of condition   Physical Exam Updated Vital Signs There were no vitals taken for this visit. Physical Exam Vitals and nursing note reviewed.  Constitutional:      Appearance: She is well-developed. She is ill-appearing. She is not toxic-appearing.  HENT:     Head: Normocephalic and atraumatic.     Comments: Left-sided facial droop noted    Mouth/Throat:     Mouth: Mucous membranes are dry.  Eyes:     Pupils: Pupils are equal, round, and reactive to light.     Comments: Right gaze preference with left-sided neglect  Cardiovascular:     Rate and Rhythm: Normal rate and regular rhythm.     Heart sounds: Normal heart sounds.  Pulmonary:     Effort: Pulmonary effort is normal. No respiratory distress.     Breath sounds: No wheezing.  Abdominal:     Palpations: Abdomen is soft.     Tenderness: There is no  abdominal tenderness.  Musculoskeletal:     Cervical back: Neck supple.     Right lower leg: No edema.     Left lower leg: No edema.  Skin:    General: Skin is warm and dry.  Neurological:     Mental Status: She is alert.     Comments: Awake, alert, oriented, right-sided gaze preference, left-sided neglect, 4+ out of 5 strength left grip strength left leg, left facial droop  Psychiatric:        Mood and Affect: Mood normal.    ED Results / Procedures / Treatments   Labs (all labs ordered are listed, but only abnormal results are displayed) Labs Reviewed  CBC - Abnormal; Notable for the following components:      Result Value   WBC  13.2 (*)    Hemoglobin 15.1 (*)    HCT 46.3 (*)    All other components within normal limits  DIFFERENTIAL - Abnormal; Notable for the following components:   Neutro Abs 10.3 (*)    All other components within normal limits  I-STAT CHEM 8, ED - Abnormal; Notable for the following components:   Sodium 129 (*)    Chloride 96 (*)    Glucose, Bld 154 (*)    Calcium, Ion 1.08 (*)    Hemoglobin 16.3 (*)    HCT 48.0 (*)    All other components within normal limits  CBG MONITORING, ED - Abnormal; Notable for the following components:   Glucose-Capillary 164 (*)    All other components within normal limits  RESP PANEL BY RT-PCR (FLU A&B, COVID) ARPGX2  PROTIME-INR  APTT  COMPREHENSIVE METABOLIC PANEL    EKG EKG Interpretation  Date/Time:  Monday March 19 2021 23:33:55 EST Ventricular Rate:  86 PR Interval:    QRS Duration: 82 QT Interval:  362 QTC Calculation: 433 R Axis:   80 Text Interpretation: Atrial fibrillation Ventricular premature complex Confirmed by Thayer Jew [phone]) on 03/20/2021 12:05:40 AM  Radiology CT HEAD CODE STROKE WO CONTRAST  Result Date: 03/19/2021 CLINICAL DATA:  Code stroke. Initial evaluation for neuro deficit, stroke suspected. EXAM: CT HEAD WITHOUT CONTRAST TECHNIQUE: Contiguous axial images were obtained from the base of the skull through the vertex without intravenous contrast. COMPARISON:  MRI from 06/08/2020. FINDINGS: Brain: 3.5 x 2.8 x 3.2 cm acute intraparenchymal hemorrhage seen centered at the right lentiform nucleus/external capsule (series 2, image 23) (estimated volume 16 mL). Mild surrounding edema with partial effacement of the right lateral ventricle, but no significant midline shift at this time. Basilar cisterns remain patent. No other acute intracranial hemorrhage. No other acute large vessel territory infarct. Underlying atrophy with moderately advanced chronic microvascular ischemic disease. No visible mass lesion. No extra-axial fluid  collection. Vascular: No hyperdense vessel. Skull: Scalp soft tissues and calvarium within normal limits. Sinuses/Orbits: Right gaze noted. Prior bilateral ocular lens replacement. Patient status post scleral buckle on the right. Paranasal sinuses and mastoid air cells are clear. Other: None. ASPECTS [facility] Stroke Program Early CT Score) Acute intracranial hemorrhage, does not apply. IMPRESSION: 1. 3.5 x 2.8 x 3.2 cm (estimated volume 16 mL) acute intraparenchymal hemorrhage centered at the right lentiform nucleus/external capsule. Mild surrounding edema without significant midline shift. 2. Underlying age-related cerebral atrophy with moderately advanced chronic microvascular ischemic disease. These results were communicated to Dr. Lorrin Goodell at 11:16 pm on 03/19/2021 by text page via the [facility] messaging system. Electronically Signed   By: Jeannine Boga M.D.   On: 03/19/2021 23:18  Procedures .Critical Care Performed by: Merryl Hacker, MD Authorized by: Merryl Hacker, MD   Critical care provider statement:    Critical care time (minutes):  30   Critical care was necessary to treat or prevent imminent or life-threatening deterioration of the following conditions:  CNS failure or compromise (Hemorrhagic stroke)   Critical care was time spent personally by me on the following activities:  Development of treatment plan with patient or surrogate, discussions with consultants, evaluation of patient's response to treatment, examination of patient, ordering and review of laboratory studies, ordering and review of radiographic studies, ordering and performing treatments and interventions, pulse oximetry, re-evaluation of patient's condition and review of old charts    Medications Ordered in ED Medications  sodium chloride flush (NS) 0.9 % injection 3 mL (has no administration in time range)  coag fact Xa recombinant (ANDEXXA) low dose infusion 900 mg (has no administration in time range)     ED Course/ Medical Decision Making/ A&P                           Medical Decision Making  This patient presents to the ED for concern of stroke, this involves an extensive number of treatment options, and is a complaint that carries with it a high risk of complications and morbidity.  The differential diagnosis includes ischemic stroke, hemorrhagic stroke, traumatic injury, seizure.  Co morbidities that complicate the patient evaluation  Atrial fibrillation, prior history of stroke, diabetes   Additional history obtained:  Additional history obtained from EMS External records from outside source obtained and reviewed including prior admissions   Lab Tests:  I Ordered, and personally interpreted labs.  The pertinent results include: Mild hyponatremia with a sodium of 129.  Glucose 164.   Imaging Studies ordered:  I ordered imaging studies including CT head I independently visualized and interpreted imaging which showed intraparenchymal bleed. I agree with the radiologist interpretation   Cardiac Monitoring:  The patient was maintained on a cardiac monitor.  I personally viewed and interpreted the cardiac monitored which showed an underlying rhythm of: atrial fibrillation.   Medicines ordered and prescription drug management:  I ordered medication including Andexxa  for Eliquis reversal  I have reviewed the patients home medicines and have made adjustments as needed   Test Considered:  Further neurologic imaging   Critical Interventions:  Anticoagulation reversal   Consultations Obtained:  I requested consultation with the neurology,  and discussed lab and imaging findings as well as pertinent plan - they recommend: ICU admission   Problem List / ED Course:  Intraparenchymal bleed.   Reevaluation:  After the interventions noted above, I reevaluated the patient and found that they have :stayed the same   Social Determinants of Health:  Lives at  home with spouse.  May need higher level of care.   Dispostion:  After consideration of the diagnostic results and the patients response to treatment, I feel that the patent would benefit from admission.         Final Clinical Impression(s) / ED Diagnoses Final diagnoses:  Hemorrhagic stroke [facility])    Rx / DC Orders ED Discharge Orders     None         Lundy Cozart, Barbette Hair, MD 03/19/21 2332    Merryl Hacker, MD 03/20/21 0005

## 2021-03-19 NOTE — ED Notes (Signed)
5mg  labetalol given in CT by [facility] RN Shanon Brow

## 2021-03-19 NOTE — ED Notes (Signed)
10mg  labetalol 2332 by [facility] RN Shanon Brow

## 2021-03-20 ENCOUNTER — Inpatient Hospital Stay (HOSPITAL_COMMUNITY): Payer: Medicare HMO

## 2021-03-20 DIAGNOSIS — I6389 Other cerebral infarction: Secondary | ICD-10-CM

## 2021-03-20 LAB — RAPID URINE DRUG SCREEN, HOSP PERFORMED
Amphetamines: NOT DETECTED
Barbiturates: NOT DETECTED
Benzodiazepines: NOT DETECTED
Cocaine: NOT DETECTED
Opiates: NOT DETECTED
Tetrahydrocannabinol: NOT DETECTED

## 2021-03-20 LAB — ECHOCARDIOGRAM COMPLETE
AR max vel: 2.55 cm2
AV Area VTI: 2.42 cm2
AV Area mean vel: 2.26 cm2
AV Mean grad: 1 mmHg
AV Peak grad: 2 mmHg
Ao pk vel: 0.7 m/s
MV M vel: 5.48 m/s
MV Peak grad: 120.1 mmHg
S' Lateral: 3.2 cm
Weight: 2638.47 oz

## 2021-03-20 LAB — RESP PANEL BY RT-PCR (FLU A&B, COVID) ARPGX2
Influenza A by PCR: NEGATIVE
Influenza B by PCR: NEGATIVE
SARS Coronavirus 2 by RT PCR: NEGATIVE

## 2021-03-20 LAB — MRSA NEXT GEN BY PCR, NASAL: MRSA by PCR Next Gen: NOT DETECTED

## 2021-03-20 LAB — LIPID PANEL
Cholesterol: 214 mg/dL — ABNORMAL HIGH (ref 0–200)
HDL: 59 mg/dL (ref >40)
LDL Cholesterol: 118 mg/dL — ABNORMAL HIGH (ref 0–99)
Total CHOL/HDL Ratio: 3.6 RATIO
Triglycerides: 184 mg/dL — ABNORMAL HIGH (ref <150)
VLDL: 37 mg/dL (ref 0–40)

## 2021-03-20 LAB — HEMOGLOBIN A1C
Hgb A1c MFr Bld: 5.4 % (ref 4.8–5.6)
Mean Plasma Glucose: 108.28 mg/dL

## 2021-03-20 MED ORDER — PANTOPRAZOLE SODIUM 40 MG PO TBEC
40.0000 mg | DELAYED_RELEASE_TABLET | Freq: Every day | ORAL | Status: DC
  Administered 2021-03-21 – 2021-03-26 (×6): 40 mg via ORAL
  Filled 2021-03-20 (×6): qty 1

## 2021-03-20 MED ORDER — BIOTENE DRY MOUTH MT LIQD
15.0000 mL | OROMUCOSAL | Status: DC | PRN
  Administered 2021-03-20: 15 mL via OROMUCOSAL

## 2021-03-20 MED ORDER — METOPROLOL TARTRATE 5 MG/5ML IV SOLN
2.5000 mg | Freq: Once | INTRAVENOUS | Status: AC
  Administered 2021-03-20: 2.5 mg via INTRAVENOUS
  Filled 2021-03-20: qty 5

## 2021-03-20 MED ORDER — ORAL CARE MOUTH RINSE
15.0000 mL | Freq: Two times a day (BID) | OROMUCOSAL | Status: DC
  Administered 2021-03-20 – 2021-03-26 (×14): 15 mL via OROMUCOSAL

## 2021-03-20 MED ORDER — CHLORHEXIDINE GLUCONATE CLOTH 2 % EX PADS
6.0000 | MEDICATED_PAD | Freq: Every day | CUTANEOUS | Status: DC
  Administered 2021-03-20: 6 via TOPICAL

## 2021-03-20 MED ORDER — GADOBUTROL 1 MMOL/ML IV SOLN
7.0000 mL | Freq: Once | INTRAVENOUS | Status: AC | PRN
  Administered 2021-03-20: 7 mL via INTRAVENOUS

## 2021-03-20 MED ORDER — DILTIAZEM HCL ER COATED BEADS 180 MG PO CP24
180.0000 mg | ORAL_CAPSULE | Freq: Every day | ORAL | Status: DC
Start: 2021-03-20 — End: 2021-03-26
  Administered 2021-03-20 – 2021-03-26 (×7): 180 mg via ORAL
  Filled 2021-03-20 (×7): qty 1

## 2021-03-20 NOTE — Progress Notes (Signed)
PT Cancellation Note  Patient Details Name: Krystal Clarke MRN: [id number] DOB: 11-Mar-1936   Cancelled Treatment:    Reason Eval/Treat Not Completed: Patient not medically ready  Leighton Roach, [place]  Pager [phone] Office [place] 03/20/2021, 10:16 AM

## 2021-03-20 NOTE — Evaluation (Signed)
Speech Language Pathology Evaluation Patient Details Name: KIMMI ACOCELLA MRN: [id number] DOB: 1935-04-15 Today's Date: 03/20/2021 Time: 0950-1020 SLP Time Calculation (min) (ACUTE ONLY): 30 min  Problem List:  Patient Active Problem List   Diagnosis Date Noted   ICH (intracerebral hemorrhage) ([place]) 03/19/2021   Stroke ([place])    Siriasis    PONV (postoperative nausea and vomiting)    Pneumonia    Peripheral neuropathy    OP (osteoporosis)    Neuropathy    Migraines    Migraine triggered seizures ([place])    Legally blind    Hypertension    History of diverticulitis    Glucose intolerance (impaired glucose tolerance)    Glaucoma    GERD (gastroesophageal reflux disease)    Complication of anesthesia    Blindness of right eye    Allergy    GI bleed 04/16/2016   Symptomatic anemia 04/16/2016   Hypoxia - on oxygen 02/10/2016   Diabetes mellitus without complication (HCC)    Obesity (BMI 70-01.7) 49/44/9675   Lichen sclerosus 91/63/8466   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 02/07/2016 02/07/2016   Chest pain 11/04/2014   Elevated white blood cell count 11/04/2014   B12 deficiency 05/20/2013   Seizure disorder ([place]) 12/07/2012   Dehydration with hyponatremia 12/07/2012   Atrial fibrillation ([place]) 12/06/2012   Paroxysmal atrial fibrillation ([place]) 12/06/2012   Acute respiratory failure with hypoxia ([place]) 12/06/2012   Gait disorder 06/05/2012   Sjogren's syndrome ([place])    Hereditary and idiopathic peripheral neuropathy 05/09/2012   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 ([place]) 05/09/2012   History of detached retina repair 02/24/2012   Primary open angle glaucoma of both eyes, severe stage 02/24/2012   ANEMIA, B12 DEFICIENCY 07/19/2009   Anxiety state 01/27/2009   Sleep apnea 12/19/2008   MIXED HYPERLIPIDEMIA 07/18/2008   HYPERTRIGLYCERIDEMIA 08/28/2006   Migraine headache 08/28/2006   Essential hypertension 08/28/2006    GERD 08/28/2006   PSORIASIS 08/28/2006   Osteoarthritis 08/28/2006   OSTEOPOROSIS 08/28/2006   Past Medical History:  Past Medical History:  Diagnosis Date   Acute respiratory failure with hypoxia ([place]) 12/06/2012   Allergy    Blindness of right eye    Complication of anesthesia    Dehydration with hyponatremia 12/07/2012   Diabetes mellitus without complication (HCC)    diagnosed 2 weeks ago- No CBG meter as of yet.   DIVERTICULITIS, HX OF 01/27/2009   Qualifier: Diagnosis of  By: Jerold Coombe     Dysrhythmia    Afib   Gait disorder 06/05/2012   GERD (gastroesophageal reflux disease)    Glaucoma    Glucose intolerance (impaired glucose tolerance)    History of diverticulitis    Hypertension    Legally blind    Migraine triggered seizures (HCC)    history of   Migraines    Neuropathy    OP (osteoporosis)    Osteoarthritis    osteoarthritis -right knee. uses walker. Left shoulder -"Limited ROM"   Paroxysmal atrial fibrillation ([place]) 12/06/2012   Echo (12/07/12): Moderate focal basal hypertrophy of the septum, vigorous LV function, EF 65-70%, indeterminate diastolic function, normal wall motion, trivial MR, moderate LAE, trivial TR;  Xarelto started 11/2012   Peripheral neuropathy    neuropathy   Pneumonia    PONV (postoperative nausea and vomiting)    no problems other past few surgeries   SHINGLES, HX OF 07/17/2009   Qualifier: Diagnosis of  By: Jerold Coombe  Siriasis    Sjogren's syndrome ([place])    bilateral vision   Sleep apnea    does not wear CPAP   Stroke ([place])    residual - rare Intermittent expressive aphasia,   Past Surgical History:  Past Surgical History:  Procedure Laterality Date   BONE TUMOR EXCISION Right    arm   CATARACT EXTRACTION     CHOLECYSTECTOMY N/A 08/01/2020   Procedure: LAPAROSCOPIC CHOLECYSTECTOMY;  Surgeon: Clovis Riley, MD;  Location: [facility];  Service: General;  Laterality: N/A;   COLONOSCOPY WITH PROPOFOL N/A 06/09/2015    Procedure: COLONOSCOPY WITH PROPOFOL;  Surgeon: Ronald Lobo, MD;  Location: [facility] ENDOSCOPY;  Service: Endoscopy;  Laterality: N/A;   DILATION AND CURETTAGE OF UTERUS     ESOPHAGOGASTRODUODENOSCOPY N/A 04/17/2016   Procedure: ESOPHAGOGASTRODUODENOSCOPY (EGD);  Surgeon: Teena Irani, MD;  Location: [facility] ENDOSCOPY;  Service: Endoscopy;  Laterality: N/A;   ESOPHAGOGASTRODUODENOSCOPY (EGD) WITH PROPOFOL N/A 06/09/2015   Procedure: ESOPHAGOGASTRODUODENOSCOPY (EGD) WITH PROPOFOL;  Surgeon: Ronald Lobo, MD;  Location[facility]COPY;  Service: Endoscopy;  Laterality: N/A;   EYE SURGERY     FOOT FRACTURE SURGERY Left 2007   GIVENS CAPSULE STUDY N/A 04/17/2016   Procedure: GIVENS CAPSULE STUDY;  Surgeon: Teena Irani, MD;  Location: [place];  Service: Endoscopy;  Laterality: N/A;   KNEE ARTHROSCOPY Right    LUMBAR LAMINECTOMY     ROBOT ASSISTED LAPAROSCOPIC PARTIAL COLECTOMY  02/07/2016   Procedure: ROBOT ASSISTED LAPAROSCOPIC PARTIAL COLECTOMY;  Surgeon: Leighton Ruff, MD;  Location: [facility];  Service: General;;   SHOULDER OPEN ROTATOR CUFF REPAIR     TONSILLECTOMY AND ADENOIDECTOMY     TOTAL ABDOMINAL HYSTERECTOMY W/ BILATERAL SALPINGOOPHORECTOMY     HPI:  86 y.o. female presented via EMS after sustaining a fall and was noted to have R gaze deviation along with left facial droop. CT head w/o contrast demonstrated right basal ganglia ICH about 78ml in volume; underlying age-related cerebral atrophy with moderately advanced chronic microvascular ischemic disease. PMH significant for glaucoma with resultant blindness, DM2 with neuropathy, sjogren's syndrome, Afib on eliquis, prior stroke, headaches, GERD, HTN.   Assessment / Plan / Recommendation Clinical Impression  Mrs. Scheerer presents with cognitive deficit typical of right hemisphere impairment: right gaze preference, flat affect, decreased awareness, impaired attention and storage for recall, difficulty with hypothetical problem-solving.  She  demonstrated great effort and described herself as "hard-headed." She demonstrated emerging carry-over of strategies for recall and asked appropriate questions about her recovery.  Recommend SLP f/u for cognition  while admitted and acute inpatient rehab consult.  OT/PT evals are pending.    SLP Assessment  SLP Recommendation/Assessment: Patient needs continued [facility] [place] [facility] SLP Visit Diagnosis: Cognitive communication deficit (R41.841)    Recommendations for follow up therapy are one component of a multi-disciplinary discharge planning process, led by the attending physician.  Recommendations may be updated based on patient status, additional functional criteria and insurance authorization.    Follow Up Recommendations  Acute inpatient rehab (3hours/day)    Assistance Recommended at Discharge  Frequent or constant Supervision/Assistance  Functional Status Assessment Patient has had a recent decline in their functional status and demonstrates the ability to make significant improvements in function in a reasonable and predictable amount of time.  Frequency and Duration min 2x/week  2 weeks      SLP Evaluation Cognition  Overall Cognitive Status: Impaired/Different from baseline Arousal/Alertness: Awake/alert Orientation Level: Oriented to situation;Oriented to time;Oriented to person Attention: Selective Selective Attention: Impaired  Selective Attention Impairment: Verbal complex Memory: Impaired Memory Impairment: Storage deficit;Retrieval deficit Awareness: Impaired Awareness Impairment: Intellectual impairment Problem Solving: Impaired Problem Solving Impairment: Verbal basic       Comprehension  Auditory Comprehension Overall Auditory Comprehension: Appears within functional limits for tasks assessed Reading Comprehension Reading Status: Not tested    Expression Expression Primary Mode of Expression: Verbal Verbal Expression Overall Verbal  Expression: Appears within functional limits for tasks assessed Written Expression Dominant Hand: Right   Oral / Motor  Oral Motor/Sensory Function Overall Oral Motor/Sensory Function: Mild impairment Facial ROM: Reduced left;Suspected CN VII (facial) dysfunction Facial Symmetry: Abnormal symmetry left;Suspected CN VII (facial) dysfunction Facial Strength: Within Functional Limits Facial Sensation: Within Functional Limits Lingual ROM: Within Functional Limits Lingual Symmetry: Within Functional Limits Velum: Within Functional Limits Motor Speech Overall Motor Speech: Appears within functional limits for tasks assessed            Juan Quam Laurice 03/20/2021, 10:41 AM Estill Bamberg L. Tivis Ringer, [place] Office number [phone] Pager [phone]

## 2021-03-20 NOTE — Progress Notes (Signed)
OT Cancellation Note  Patient Details Name: Krystal Clarke MRN: [id number] DOB: 1935/12/14   Cancelled Treatment:    Reason Eval/Treat Not Completed: Patient not medically ready  Billey Chang, OTR/L  Acute Rehabilitation Services Pager: [phone] Office: [phone] .  03/20/2021, 8:48 AM

## 2021-03-20 NOTE — ED Notes (Signed)
Remaining 5mg  or 20mg  labetalol given 0001

## 2021-03-20 NOTE — Evaluation (Signed)
Clinical/Bedside Swallow Evaluation Patient Details  Name: Krystal Clarke MRN: [id number] Date of Birth: 11/03/1935  Today's Date: 03/20/2021 Time: SLP Start Time (ACUTE ONLY): 0919 SLP Stop Time (ACUTE ONLY): 0950 SLP Time Calculation (min) (ACUTE ONLY): 31 min  Past Medical History:  Past Medical History:  Diagnosis Date   Acute respiratory failure with hypoxia ([place]) 12/06/2012   Allergy    Blindness of right eye    Complication of anesthesia    Dehydration with hyponatremia 12/07/2012   Diabetes mellitus without complication (HCC)    diagnosed 2 weeks ago- No CBG meter as of yet.   DIVERTICULITIS, HX OF 01/27/2009   Qualifier: Diagnosis of  By: Jerold Coombe     Dysrhythmia    Afib   Gait disorder 06/05/2012   GERD (gastroesophageal reflux disease)    Glaucoma    Glucose intolerance (impaired glucose tolerance)    History of diverticulitis    Hypertension    Legally blind    Migraine triggered seizures (HCC)    history of   Migraines    Neuropathy    OP (osteoporosis)    Osteoarthritis    osteoarthritis -right knee. uses walker. Left shoulder -"Limited ROM"   Paroxysmal atrial fibrillation ([place]) 12/06/2012   Echo (12/07/12): Moderate focal basal hypertrophy of the septum, vigorous LV function, EF 65-70%, indeterminate diastolic function, normal wall motion, trivial MR, moderate LAE, trivial TR;  Xarelto started 11/2012   Peripheral neuropathy    neuropathy   Pneumonia    PONV (postoperative nausea and vomiting)    no problems other past few surgeries   SHINGLES, HX OF 07/17/2009   Qualifier: Diagnosis of  By: Jerold Coombe     Siriasis    Sjogren's syndrome ([place])    bilateral vision   Sleep apnea    does not wear CPAP   Stroke ([place])    residual - rare Intermittent expressive aphasia,   Past Surgical History:  Past Surgical History:  Procedure Laterality Date   BONE TUMOR EXCISION Right    arm   CATARACT EXTRACTION     CHOLECYSTECTOMY N/A 08/01/2020    Procedure: LAPAROSCOPIC CHOLECYSTECTOMY;  Surgeon: Clovis Riley, MD;  Location: [facility];  Service: General;  Laterality: N/A;   COLONOSCOPY WITH PROPOFOL N/A 06/09/2015   Procedure: COLONOSCOPY WITH PROPOFOL;  Surgeon: Ronald Lobo, MD;  [facility] ENDOSCOPY;  Service: Endoscopy;  Laterality: N/A;   DILATION AND CURETTAGE OF UTERUS     ESOPHAGOGASTRODUODENOSCOPY N/A 04/17/2016   Procedure: ESOPHAGOGASTRODUODENOSCOPY (EGD);  Surgeon: Teena Irani, MD;  Location[facility]COPY;  Service: Endoscopy;  Laterality: N/A;   ESOPHAGOGASTRODUODENOSCOPY (EGD) WITH PROPOFOL N/A 06/09/2015   Procedure: ESOPHAGOGASTRODUODENOSCOPY (EGD) WITH PROPOFOL;  Surgeon: Ronald Lobo, MD;  Location: [facility] ENDOSCOPY;  Service: Endoscopy;  Laterality: N/A;   EYE SURGERY     FOOT FRACTURE SURGERY Left 2007   GIVENS CAPSULE STUDY N/A 04/17/2016   Procedure: GIVENS CAPSULE STUDY;  Surgeon: Teena Irani, MD;  Location: [place];  Service: Endoscopy;  Laterality: N/A;   KNEE ARTHROSCOPY Right    LUMBAR LAMINECTOMY     ROBOT ASSISTED LAPAROSCOPIC PARTIAL COLECTOMY  02/07/2016   Procedure: ROBOT ASSISTED LAPAROSCOPIC PARTIAL COLECTOMY;  Surgeon: Leighton Ruff, MD;  Location: [facility];  Service: General;;   SHOULDER OPEN ROTATOR CUFF REPAIR     TONSILLECTOMY AND ADENOIDECTOMY     TOTAL ABDOMINAL HYSTERECTOMY W/ BILATERAL SALPINGOOPHORECTOMY     HPI:  86 y.o. female presented via EMS after sustaining a fall  and was noted to have R gaze deviation along with left facial droop. CT head w/o contrast demonstrated right basal ganglia ICH about 2ml in volume; underlying age-related cerebral atrophy with moderately advanced chronic microvascular ischemic disease. PMH significant for glaucoma with resultant blindness, DM2 with neuropathy, sjogren's syndrome, Afib on eliquis, prior stroke, headaches, GERD, HTN. Pt's daughter, Jenny Reichmann, describes a lot of "gut" issues, difficulty with regurgitation, daily nausea, dryness related to her  Sjogren's.  Pt has been followed by Dr. Cristina Gong GI for years. Most recent esophagram 01/12/20: "Esophageal dysmotility with poor propagation of the primary  peristaltic wave, occasional tertiary contractions and moderate esophageal stasis."    Assessment / Plan / Recommendation  Clinical Impression  Pt presents with functional swallowing overlying a baseline esphageal dysphagia with moderate motility issues (leading to early satiety, regurgitation, mild cough).  There appear to be no acute changes in oropharyngeal function.  She demonstrates adequate oral attention with no residuals, brisk swallow response, no concerns for active aspiration.  Pt's daughter, Jenny Reichmann, will bring in adhesive for her dentures.  Recommend resuming a regular solid diet, thin liquids; meds in liquid.  No f/u for swallowing needed. D/W pt and RN. SLP Visit Diagnosis: Dysphagia, unspecified (R13.10)    Aspiration Risk  No limitations    Diet Recommendation   Regular solids, thin liquids  Medication Administration: Whole meds with liquid    Other  Recommendations Oral Care Recommendations: Oral care BID    Recommendations for follow up therapy are one component of a multi-disciplinary discharge planning process, led by the attending physician.  Recommendations may be updated based on patient status, additional functional criteria and insurance authorization.  Follow up Recommendations No SLP follow up        Swallow Study   General Date of Onset: 03/20/21 HPI: 86 y.o. female presented via EMS after sustaining a fall and was noted to have R gaze deviation along with left facial droop. CT head w/o contrast demonstrated right basal ganglia ICH about 66ml in volume; underlying age-related cerebral atrophy with moderately advanced chronic microvascular ischemic disease. PMH significant for glaucoma with resultant blindness, DM2 with neuropathy, sjogren's syndrome, Afib on eliquis, prior stroke, headaches, GERD, HTN. Pt's  daughter, Jenny Reichmann, describes a lot of "gut" issues, difficulty with regurgitation, daily nausea, dryness related to her Sjogren's.  Pt has been followed by Dr. Cristina Gong GI for years. Most recent esophagram 01/12/20: "Esophageal dysmotility with poor propagation of the primary  peristaltic wave, occasional tertiary contractions and moderate esophageal stasis." Type of Study: Bedside Swallow Evaluation Previous Swallow Assessment: no Diet Prior to this Study: NPO Temperature Spikes Noted: No Respiratory Status: Room air History of Recent Intubation: No Behavior/Cognition: Alert;Cooperative;Pleasant mood Oral Cavity Assessment: Dry;Within Functional Limits Oral Cavity - Dentition: Edentulous (dentures in room; no adhesive) Vision: Impaired for self-feeding Self-Feeding Abilities: Needs assist Patient Positioning: Upright in bed Baseline Vocal Quality: Normal Volitional Cough: Strong    Oral/Motor/Sensory Function Overall Oral Motor/Sensory Function: Mild impairment Facial ROM: Reduced left;Suspected CN VII (facial) dysfunction Facial Symmetry: Abnormal symmetry left;Suspected CN VII (facial) dysfunction Facial Strength: Within Functional Limits Facial Sensation: Within Functional Limits Lingual ROM: Within Functional Limits Lingual Symmetry: Within Functional Limits Velum: Within Functional Limits   Ice Chips Ice chips: Within functional limits   Thin Liquid Thin Liquid: Within functional limits    Nectar Thick Nectar Thick Liquid: Within functional limits   Honey Thick Honey Thick Liquid: Not tested   Puree Puree: Within functional limits   Solid  Solid: Not tested      Juan Quam Laurice 03/20/2021,10:32 AM  Estill Bamberg L. Tivis Ringer, [place] Office number [phone] Pager [phone]

## 2021-03-20 NOTE — Plan of Care (Signed)
°  Problem: Coping: Goal: Level of anxiety will decrease Outcome: Progressing   Problem: Education: Goal: Knowledge of disease or condition will improve Outcome: Progressing Goal: Knowledge of secondary prevention will improve (SELECT ALL) Outcome: Progressing

## 2021-03-20 NOTE — Progress Notes (Addendum)
STROKE TEAM PROGRESS NOTE   INTERVAL HISTORY Patient is seen in her room with her daughter at the bedside.  Her swallow evaluation is in progress.  She presented after a fall at home with right gaze deviation and left facial droop.  She was found to have a right basal ganglia ICH about 16 mL in volume. Eliquis was reversed with Andexxa. She was admitted to the ICU. Cleviprex is being used to control blood pressure.  MRI scan of the brain showed stable appearance of the basal ganglia hemorrhage without significant cytotoxic edema, mass-effect or midline shift.  Vitals:   03/20/21 0915 03/20/21 0930 03/20/21 0945 03/20/21 1000  BP: 121/74 136/84 (!) 136/95 (!) 140/104  Pulse: 92 95 100 (!) 115  Resp: 15 15 19 14   Temp:      TempSrc:      SpO2: 91% 94% 91% 94%  Weight:       CBC:  Recent Labs  Lab 03/19/21 2300 03/19/21 2308  WBC 13.2*  --   NEUTROABS 10.3*  --   HGB 15.1* 16.3*  HCT 46.3* 48.0*  MCV 93.7  --   PLT 167  --    Basic Metabolic Panel:  Recent Labs  Lab 03/19/21 2300 03/19/21 2308  NA 130* 129*  K 4.0 4.0  CL 96* 96*  CO2 24  --   GLUCOSE 162* 154*  BUN 9 9  CREATININE 0.78 0.60  CALCIUM 9.8  --    Lipid Panel:  Recent Labs  Lab 03/20/21 0739  CHOL 214*  TRIG 184*  HDL 59  CHOLHDL 3.6  VLDL 37  LDLCALC 118*   HgbA1c:  Recent Labs  Lab 03/20/21 0739  HGBA1C 5.4   Urine Drug Screen: No results for input(s): LABOPIA, COCAINSCRNUR, LABBENZ, AMPHETMU, THCU, LABBARB in the last 168 hours.  Alcohol Level No results for input(s): ETH in the last 168 hours.  IMAGING past 24 hours CT HEAD WO CONTRAST  Result Date: 03/20/2021 CLINICAL DATA:  86 year old female code stroke presentation, right lentiform hemorrhage. EXAM: CT HEAD WITHOUT CONTRAST TECHNIQUE: Contiguous axial images were obtained from the base of the skull through the vertex without intravenous contrast. COMPARISON:  Head CT 03/19/2021.  Brain MRI 06/08/2020. FINDINGS: Brain: Biconvex  hyperdense hemorrhage centered at the right lentiform encompasses 42 x 26 by 36 mm (AP by transverse by CC) for an estimated blood volume of 20 mL, stable when measured using the same technique previously. Surrounding edema but only mild regional mass effect is stable. No extra-axial or intraventricular extension identified. No midline shift. Basilar cisterns remain normal. Elsewhere Stable gray-white matter differentiation throughout the brain. Patchy and confluent white matter hypodensity. Vascular: Calcified atherosclerosis at the skull base. No suspicious intracranial vascular hyperdensity. Skull: Mild motion artifact. No acute osseous abnormality identified. Sinuses/Orbits: Visualized paranasal sinuses and mastoids are stable and well aerated. Other: Postoperative changes to both globes. No acute orbit or scalp soft tissue finding. IMPRESSION: 1. Stable right basal ganglia hemorrhage since yesterday. Estimated blood volume up to 20 mL. Surrounding edema with only mild regional mass effect. 2. No new intracranial abnormality. Electronically Signed   By: Genevie Ann M.D.   On: 03/20/2021 05:04   MR BRAIN W WO CONTRAST  Result Date: 03/20/2021 CLINICAL DATA:  86 year old female code stroke presentation, right lentiform hemorrhage. EXAM: MRI HEAD WITHOUT AND WITH CONTRAST TECHNIQUE: Multiplanar, multiecho pulse sequences of the brain and surrounding structures were obtained without and with intravenous contrast. CONTRAST:  75mL GADAVIST GADOBUTROL 1  MMOL/ML IV SOLN COMPARISON:  Head CT 0447 hours today. Head CT yesterday. Brain MRI 06/08/2020. FINDINGS: Brain: Mixed density but mostly T2 hyperintense and T1 isointense intra-axial hemorrhage centered at the right basal ganglia is 38 x 29 x 36 mm (AP by transverse by CC - 20 mL) with a small dark T2 nodular area along its inferior margin (series 10, image 16). But no associated enhancement. Susceptibility artifact on DWI with no larger area of restricted diffusion.  Regional edema and stable regional mass effect including on the right lateral ventricle. No midline shift. No restricted diffusion elsewhere. Chronic microhemorrhages in the right temporal lobe and along the right parietooccipital sulcus are stable since March, but a left thalamic microhemorrhage is new since that time on series 14, image 30. That lesion is chronic. Otherwise stable gray and white matter signal with confluent, patchy bilateral T2 and FLAIR hyperintensity in the cerebral white matter and pons. No abnormal enhancement identified. No dural thickening. No restricted diffusion to suggest acute infarction. No evidence of mass lesion, ventriculomegaly. Cervicomedullary junction and pituitary are within normal limits. Vascular: Major intracranial vascular flow voids are stable since March with mild generalized intracranial artery tortuosity. Distal right vertebral artery of peers dominant. The major dural venous sinuses are enhancing and appear to be patent. Skull and upper cervical spine: Negative visible cervical spine. Visualized bone marrow signal is within normal limits. Sinuses/Orbits: Stable postoperative changes to the globes. Paranasal Visualized paranasal sinuses and mastoids are stable and well aerated. Other: Visible internal auditory structures appear normal. Negative visible scalp and face. IMPRESSION: 1. Left basal ganglia hemorrhage is stable since presentation. 20 mL intra-axial volume with no IVH, evidence of underlying mass, or other acute intracranial abnormality. 2. A chronic microhemorrhage in the left thalamus is new since March. Elsewhere bilateral small vessel disease is stable. Electronically Signed   By: Genevie Ann M.D.   On: 03/20/2021 06:17   CT HEAD CODE STROKE WO CONTRAST  Result Date: 03/19/2021 CLINICAL DATA:  Code stroke. Initial evaluation for neuro deficit, stroke suspected. EXAM: CT HEAD WITHOUT CONTRAST TECHNIQUE: Contiguous axial images were obtained from the base of  the skull through the vertex without intravenous contrast. COMPARISON:  MRI from 06/08/2020. FINDINGS: Brain: 3.5 x 2.8 x 3.2 cm acute intraparenchymal hemorrhage seen centered at the right lentiform nucleus/external capsule (series 2, image 23) (estimated volume 16 mL). Mild surrounding edema with partial effacement of the right lateral ventricle, but no significant midline shift at this time. Basilar cisterns remain patent. No other acute intracranial hemorrhage. No other acute large vessel territory infarct. Underlying atrophy with moderately advanced chronic microvascular ischemic disease. No visible mass lesion. No extra-axial fluid collection. Vascular: No hyperdense vessel. Skull: Scalp soft tissues and calvarium within normal limits. Sinuses/Orbits: Right gaze noted. Prior bilateral ocular lens replacement. Patient status post scleral buckle on the right. Paranasal sinuses and mastoid air cells are clear. Other: None. ASPECTS [facility] Stroke Program Early CT Score) Acute intracranial hemorrhage, does not apply. IMPRESSION: 1. 3.5 x 2.8 x 3.2 cm (estimated volume 16 mL) acute intraparenchymal hemorrhage centered at the right lentiform nucleus/external capsule. Mild surrounding edema without significant midline shift. 2. Underlying age-related cerebral atrophy with moderately advanced chronic microvascular ischemic disease. These results were communicated to Dr. Lorrin Goodell at 11:16 pm on 03/19/2021 by text page via the [facility] messaging system. Electronically Signed   By: Jeannine Boga M.D.   On: 03/19/2021 23:18    PHYSICAL EXAM General- alert, well-developed, well-nourished elderly Caucasian female  in no acute distress.  Hematoma present on right knee   NEURO:  Mental Status: AA&Ox3  Speech/Language: speech is without dysarthria or aphasia.    Cranial Nerves:  II: PERRL. Left visual field cut, does not blink to threat on left III, IV, VI: right gaze deviation but able to look to the left but  cannot cross midline V: Sensation is intact to light touch and symmetrical to face.  VII: left facial droop present VIII: hearing intact to voice. IX, X:  Phonation is normal.  XII: tongue is midline without fasciculations. Motor: 5/5 strength to RUE.  Good antigravity strength in LUE and BLE Sensation- Intact to light touch bilaterally. Extinction absent to light touch to DSS. Gait- deferred   ASSESSMENT/PLAN Ms. Krystal Clarke is a 86 y.o. female with history of atrial fibrillation on Eliquis, glaucoma, CM2, sjogren's syndrome, stroke, GERD and HTN  presenting after a fall at home with right gaze deviation and left facial droop.  She was found to have a right basal ganglia ICH about 16 mL in volume.  Eliquis was reversed with Andexxa.  She was admitted to the ICU. Cleviprex is being used to control blood pressure.  ICH:  right basal ganglia secondary to Eliquis use vs. Hemorrhagic conversion of ischemic stroke Code Stroke CT head 16 mL IPH in right lentiform nucleus/external capsule, age-related atrophy and moderate chronic microvascular ischemic disease CTA head & neck pending MRI  right basal ganglia IPH, chronic microhemorrhage in left thalamus 2D Echo pending LDL 118 HgbA1c 5.4 VTE prophylaxis - SCDs    Diet   Diet NPO time specified   Eliquis (apixaban) daily prior to admission, now on No antithrombotic secondary to IPH Therapy recommendations:  pending Disposition:  pending  Hypertension Home meds:  cardizem XR 180 mg daily Unstable, requiring Cleviprex SBP <140 Long-term BP goal normotensive  Hyperlipidemia Home meds:  none LDL 118, goal < 70 Add statin at discharge  High intensity statin not indicated at this time due to IPH Continue statin at discharge  Atrial fibrillation Will restart home Cardizem once swallow evaluation passed Consider restarting Eliquis as outpatient  Other Stroke Risk Factors Advanced Age >/= 69  Former cigarette smoker Hx  stroke   Other Active Problems glaucoma  Hospital day # [place] , MSN, AGACNP-BC [facility] See Amion for schedule and pager information 03/20/2021 10:18 AM   STROKE MD NOTE :  I have personally obtained history,examined this patient, reviewed notes, independently viewed imaging studies, participated in medical decision making and plan of care.ROS completed by me personally and pertinent positives fully documented  I have made any additions or clarifications directly to the above note. Agree with note above.  Patient presented with left hemiparesis due to right basal ganglia hemorrhage likely from uncontrolled hypertension versus hemorrhagic infarct as well as effect of Eliquis.  She was reversed with Andexxa and continue close neurological monitoring and strict blood pressure control with systolic goal 147-829 for the first 24 hours then below 160.  He was as needed IV labetalol and hydralazine and wean Cleviprex drip as tolerated.  Check CT angiogram of the brain and neck tomorrow morning and echocardiogram.  Physical Occupational Therapy consults and speech therapy for swallow eval.  Resume home medication when able to swallow safely. This patient is critically ill and at significant risk of neurological worsening, death and care requires constant monitoring of vital signs, hemodynamics,respiratory and cardiac monitoring, extensive review of multiple databases, frequent  neurological assessment, discussion with family, other specialists and medical decision making of high complexity.I have made any additions or clarifications directly to the above note.This critical care time does not reflect procedure time, or teaching time or supervisory time of PA/NP/Med Resident etc but could involve care discussion time.  I spent 30 minutes of neurocritical care time  in the care of  this patient.     Antony Contras, MD Medical Director [facility] Pager:  [phone] 03/20/2021 4:00 PM   To contact Stroke Continuity provider, please refer to [email or web address] After hours, contact General Neurology

## 2021-03-20 NOTE — Code Documentation (Signed)
Stroke Response Nurse Documentation Code Documentation  Krystal Clarke is a 86 y.o. female arriving to [facility] ED via [facility] on 1/2 with past medical hx of seizures, CVA, HTN, HLD and afib. On Eliquis (apixaban) daily. Code stroke was activated by EMS.   Patient from home where she was LKW at 2145 and now complaining of Right gaze and left droop .    Stroke team at the bedside on patient arrival. Labs drawn and patient cleared for CT by Dr. Verner Chol. Patient to CT with team. NIHSS 6, see documentation for details and code stroke times. Patient with right gaze preference , bilateral hemianopia, left facial droop, and dysarthria  on exam. The following imaging was completed:  CT. Patient is not a candidate for IV Thrombolytic due to hemorrhage.  Care/Plan: BP less than 138mmHg.   Bedside handoff with ED RN Jinny Blossom.    Madelynn Done  Rapid Response RN

## 2021-03-20 NOTE — Progress Notes (Signed)
°  Echocardiogram 2D Echocardiogram has been performed.  Krystal Clarke 03/20/2021, 2:14 PM

## 2021-03-20 NOTE — TOC CAGE-AID Note (Signed)
Transition of Care [facility]) - CAGE-AID Screening   Patient Details  Name: Krystal Clarke MRN: [id number] Date of Birth: 03/14/1936  Transition of Care [facility]) CM/SW Contact:    Dianne Bady C Tarpley-Carter, [place] Phone Number: 03/20/2021, 9:11 AM   Clinical Narrative: Pt is unable to participate in Cage Aid.  Valecia Beske Tarpley-Carter, MSW, LCSW-A Pronouns:  She/Her/Hers [place] Transitions of Care Clinical Social Worker Direct Number:  [phone] [email or web address]  CAGE-AID Screening: Substance Abuse Screening unable to be completed due to: : Patient unable to participate             Substance Abuse Education Offered: No

## 2021-03-21 ENCOUNTER — Inpatient Hospital Stay (HOSPITAL_COMMUNITY): Payer: Medicare HMO

## 2021-03-21 MED ORDER — LABETALOL HCL 5 MG/ML IV SOLN: 5.0000 mg | INTRAVENOUS | Status: DC | PRN

## 2021-03-21 MED ORDER — IOHEXOL 350 MG/ML SOLN
75.0000 mL | Freq: Once | INTRAVENOUS | Status: AC | PRN
  Administered 2021-03-21: 75 mL via INTRAVENOUS

## 2021-03-21 MED ORDER — GERHARDT'S BUTT CREAM
TOPICAL_CREAM | Freq: Two times a day (BID) | CUTANEOUS | Status: DC
  Administered 2021-03-24: 1 via TOPICAL
  Filled 2021-03-21 (×2): qty 1
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
## 2021-03-21 NOTE — Plan of Care (Signed)
°  Problem: Education: Goal: Knowledge of disease or condition will improve Outcome: Progressing Goal: Knowledge of secondary prevention will improve (SELECT ALL) Outcome: Progressing   Problem: Coping: Goal: Will verbalize positive feelings about self Outcome: Progressing   Problem: Health Behavior/Discharge Planning: Goal: Ability to manage health-related needs will improve Outcome: Progressing   Problem: Intracerebral Hemorrhage Tissue Perfusion: Goal: Complications of Intracerebral Hemorrhage will be minimized Outcome: Progressing

## 2021-03-21 NOTE — Progress Notes (Signed)
STROKE TEAM PROGRESS NOTE   INTERVAL HISTORY Patient is seen in her room with her therapist at the bedside.   She has passed swallow eval.  Blood pressure has been well controlled and she is off Cleviprex drip.  Neurological exam is unchanged and vital signs are stable. Vitals:   03/21/21 1013 03/21/21 1105 03/21/21 1200 03/21/21 1305  BP: 122/75 110/67  125/89  Pulse:  (!) 59 62 67  Resp:  (!) 24 (!) 21 (!) 23  Temp:   98.8 F (37.1 C)   TempSrc:   Oral   SpO2:  92% 93% 92%  Weight:       CBC:  Recent Labs  Lab 03/19/21 2300 03/19/21 2308  WBC 13.2*  --   NEUTROABS 10.3*  --   HGB 15.1* 16.3*  HCT 46.3* 48.0*  MCV 93.7  --   PLT 167  --    Basic Metabolic Panel:  Recent Labs  Lab 03/19/21 2300 03/19/21 2308  NA 130* 129*  K 4.0 4.0  CL 96* 96*  CO2 24  --   GLUCOSE 162* 154*  BUN 9 9  CREATININE 0.78 0.60  CALCIUM 9.8  --    Lipid Panel:  Recent Labs  Lab 03/20/21 0739  CHOL 214*  TRIG 184*  HDL 59  CHOLHDL 3.6  VLDL 37  LDLCALC 118*   HgbA1c:  Recent Labs  Lab 03/20/21 0739  HGBA1C 5.4   Urine Drug Screen:  Recent Labs  Lab 03/20/21 1141  LABOPIA NONE DETECTED  COCAINSCRNUR NONE DETECTED  LABBENZ NONE DETECTED  AMPHETMU NONE DETECTED  THCU NONE DETECTED  LABBARB NONE DETECTED    Alcohol Level No results for input(s): ETH in the last 168 hours.  IMAGING past 24 hours CT ANGIO HEAD NECK W WO CM  Result Date: 03/21/2021 CLINICAL DATA:  Follow-up examination for neuro deficit, stroke suspected. EXAM: CT ANGIOGRAPHY HEAD AND NECK TECHNIQUE: Multidetector CT imaging of the head and neck was performed using the standard protocol during bolus administration of intravenous contrast. Multiplanar CT image reconstructions and MIPs were obtained to evaluate the vascular anatomy. Carotid stenosis measurements (when applicable) are obtained utilizing NASCET criteria, using the distal internal carotid diameter as the denominator. CONTRAST:  53mL OMNIPAQUE  IOHEXOL 350 MG/ML SOLN COMPARISON:  Prior MRI and CT from 03/20/2021. FINDINGS: CT HEAD FINDINGS Brain: Intraparenchymal hemorrhage centered at the right basal ganglia is not significantly changed in size and morphology. Mild surrounding edema with partial effacement of the right lateral ventricle, but no other significant regional mass effect. No significant midline shift. No intraventricular extension or other complication. No other new intracranial hemorrhage or large vessel territory infarct. Underlying atrophy with chronic microvascular ischemic disease again noted. No mass lesion or hydrocephalus. No extra-axial fluid collection. Vascular: No hyperdense vessel. Scattered vascular calcifications noted within the carotid siphons. Skull: Scalp soft tissues and calvarium demonstrate no new finding. Sinuses: Clear. Orbits: Globes orbital soft tissues demonstrate no new finding. Review of the MIP images confirms the above findings CTA NECK FINDINGS Aortic arch: Visualized aortic arch normal in caliber with normal branch pattern. Mild plaque about the arch itself. No stenosis about the origin the great vessels. Right carotid system: Right common and internal carotid arteries are diffusely tortuous but widely patent without stenosis or dissection. Left carotid system: Left common and internal carotid arteries are diffusely tortuous but widely patent without stenosis or dissection. Vertebral arteries: Left vertebral artery arises directly from the aortic arch. Right vertebral artery dominant.  Vertebral arteries tortuous but widely patent without stenosis or dissection. Skeleton: No discrete or worrisome osseous lesions. Patient is edentulous. Other neck: No other soft tissue abnormality within the neck. Chronic fatty atrophy involving the salivary glands noted. Few small thyroid nodules noted, largest of which measures 11 mm on the right. These are of doubtful significance given size and patient age, no follow-up  imaging recommended (ref: J Am Coll Radiol. 2015 Feb;12(2): 143-50). Upper chest: Visualized upper chest demonstrates no acute finding. Review of the MIP images confirms the above findings CTA HEAD FINDINGS Anterior circulation: Both internal carotid arteries widely patent to the termini without stenosis. A1 segments widely patent. Normal anterior communicating artery complex. Both anterior cerebral arteries widely patent to their distal aspects without stenosis. No M1 stenosis or occlusion. Normal MCA bifurcations. Distal MCA branches well perfused and symmetric. Posterior circulation: Both V4 segments patent to the vertebrobasilar junction without stenosis. Both PICA origins patent and normal. Basilar widely patent to its distal aspect without stenosis. Superior cerebellar arteries patent bilaterally. Both PCAs primarily supplied via the basilar and are well perfused to there distal aspects. Venous sinuses: Grossly patent allowing for timing the contrast bolus. Anatomic variants: None significant. No aneurysm. No vascular abnormality seen underlying the right basal ganglia hemorrhage. Review of the MIP images confirms the above findings IMPRESSION: CT HEAD IMPRESSION: 1. No significant interval change in size and appearance of acute right basal ganglia hemorrhage. Similar surrounding edema without significant regional mass effect. 2. No other new acute intracranial abnormality. CTA HEAD AND NECK IMPRESSION: 1. Negative CTA of the head and neck. No large vessel occlusion. Mild atherosclerotic disease for age, with no hemodynamically significant or correctable stenosis. No vascular abnormality seen underlying the right basal ganglia hemorrhage. 2. Diffuse tortuosity of the major arterial vasculature of the head and neck, suggesting chronic underlying hypertension. Electronically Signed   By: Jeannine Boga M.D.   On: 03/21/2021 05:15   DG Ankle Right Port  Result Date: 03/21/2021 CLINICAL DATA:  Trauma,  fall EXAM: PORTABLE RIGHT ANKLE - 2 VIEW COMPARISON:  None. FINDINGS: No recent fracture or dislocation is seen. Bony spurs are noted at the tips of medial and lateral malleoli. Ankle mortise is well-maintained. Bony structures in the lateral view are partly obscured by phalanges in the hand used for immobilization. IMPRESSION: No recent fracture or dislocation is seen. Electronically Signed   By: Elmer Picker M.D.   On: 03/21/2021 14:14    PHYSICAL EXAM General- alert, well-developed, well-nourished elderly Caucasian female in no acute distress.  Hematoma present on right knee   NEURO:  Mental Status: AA&Ox3  Speech/Language: speech is without dysarthria or aphasia.    Cranial Nerves:  II: PERRL. Left visual field cut, does not blink to threat on left III, IV, VI: right gaze deviation but able to look to the left but cannot cross midline V: Sensation is intact to light touch and symmetrical to face.  VII: left facial droop present VIII: hearing intact to voice. IX, X:  Phonation is normal.  XII: tongue is midline without fasciculations. Motor: 5/5 strength to RUE.  Good antigravity strength in LUE and BLE Sensation- Intact to light touch bilaterally. Extinction absent to light touch to DSS. Gait- deferred   ASSESSMENT/PLAN Ms. Krystal Clarke is a 86 y.o. female with history of atrial fibrillation on Eliquis, glaucoma, CM2, sjogren's syndrome, stroke, GERD and HTN  presenting after a fall at home with right gaze deviation and left facial droop.  She  was found to have a right basal ganglia ICH about 16 mL in volume.  Eliquis was reversed with Andexxa.  She was admitted to the ICU. Cleviprex is being used to control blood pressure.  ICH:  right basal ganglia secondary to Eliquis use vs. Hemorrhagic conversion of ischemic stroke Code Stroke CT head 16 mL IPH in right lentiform nucleus/external capsule, age-related atrophy and moderate chronic microvascular ischemic disease CTA head  & neck no large vessel occlusion or aneurysm.  Mild atherosclerotic changes.   MRI  right basal ganglia IPH, chronic microhemorrhage in left thalamus 2D Echo ejection fraction 60 to 65%.  Moderate left atrial dilatation.   LDL 118 HgbA1c 5.4 VTE prophylaxis - SCDs    Diet   Diet regular Room service appropriate? Yes with Assist; Fluid consistency: Thin   Eliquis (apixaban) daily prior to admission, now on No antithrombotic secondary to [place] Therapy recommendations: SNF rehab  disposition:  pending  Hypertension Home meds:  cardizem XR 180 mg daily Unstable, requiring Cleviprex SBP <140 Long-term BP goal normotensive  Hyperlipidemia Home meds:  none LDL 118, goal < 70 Add statin at discharge  High intensity statin not indicated at this time due to IPH Continue statin at discharge  Atrial fibrillation Will restart home Cardizem once swallow evaluation passed Consider restarting Eliquis as outpatient  Other Stroke Risk Factors Advanced Age >/= 78  Former cigarette smoker Hx stroke   Other Active Problems glaucoma  Hospital day # 2  Patient is doing well.  Continue close neurological monitoring and strict blood pressure control with systolic goal below 681.  Continue oral blood pressure medications as well as needed IV labetalol and hydralazine .continue ongoing physical and Occupational Therapy.  Mobilize out of bed.  Transfer to neurology floor bed when available.  We will asked medical hospitalist team to resume care when she goes to the floor.  No family available at the bedside for discussion today. This patient is critically ill and at significant risk of neurological worsening, death and care requires constant monitoring of vital signs, hemodynamics,respiratory and cardiac monitoring, extensive review of multiple databases, frequent neurological assessment, discussion with family, other specialists and medical decision making of high complexity.I have made any additions  or clarifications directly to the above note.This critical care time does not reflect procedure time, or teaching time or supervisory time of PA/NP/Med Resident etc but could involve care discussion time.  I spent 30 minutes of neurocritical care time  in the care of  this patient.     Antony Contras, MD Medical Director [place] Pager: [phone] 03/21/2021 2:22 PM   To contact Stroke Continuity provider, please refer to [email or web address] After hours, contact General Neurology

## 2021-03-21 NOTE — Consult Note (Signed)
[place] Nurse wound consult note Consultation was completed by review of records, images and assistance from the bedside nurse/clinical staff.  Reason for Consult: MASD Patient reported to have come in with MASD primarily in the bilateral groin and around the anus. Patient is incontinent of b/b Wound type: Moisture associated skin damage related to incontinence  Pressure Injury POA: NA Periwound: intact  Dressing procedure/placement/frequency:  Discussed with bedside nurse via secure chat Will add Gerhardt's butt cream around anus for irritation from stool Will add antimicrobial wicking fabric to the groin to wick moisture   Re consult if needed, will not follow at this time. Thanks  Tylyn Stankovich [employer], RN,CWOCN, CNS, [place] [phone])
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
## 2021-03-22 DIAGNOSIS — R32 Unspecified urinary incontinence: Secondary | ICD-10-CM | POA: Diagnosis present

## 2021-03-22 DIAGNOSIS — L8990 Pressure ulcer of unspecified site, unspecified stage: Secondary | ICD-10-CM | POA: Insufficient documentation

## 2021-03-22 DIAGNOSIS — L258 Unspecified contact dermatitis due to other agents: Secondary | ICD-10-CM | POA: Diagnosis present

## 2021-03-22 MED ORDER — CYCLOBENZAPRINE HCL 10 MG PO TABS
5.0000 mg | ORAL_TABLET | Freq: Three times a day (TID) | ORAL | Status: DC | PRN
  Administered 2021-03-22 – 2021-03-25 (×2): 5 mg via ORAL
  Filled 2021-03-22 (×2): qty 1

## 2021-03-22 NOTE — NC FL2 (Signed)
[place] MEDICAID FL2 LEVEL OF CARE SCREENING TOOL     IDENTIFICATION  Patient Name: Krystal Clarke Birthdate: Apr 20, 1935 Sex: female Admission Date (Current Location): 03/19/2021  [facility] and [place] Number:  Herbalist and Address:  [facility], [place] [address] [place], [place] [address]      Provider Number: [id number]  Attending Physician Name and Address:  Patrecia Pour, MD  Relative Name and Phone Number:       Current Level of Care: Hospital Recommended Level of Care: [place] Prior Approval Number:    Date Approved/Denied:   PASRR Number: [id number]  Discharge Plan: SNF    Current Diagnoses: Patient Active Problem List   Diagnosis Date Noted   Pressure injury of skin 03/22/2021   ICH (intracerebral hemorrhage) ([place]) 03/19/2021   Stroke ([place])    Siriasis    PONV (postoperative nausea and vomiting)    Pneumonia    Peripheral neuropathy    OP (osteoporosis)    Neuropathy    Migraines    Migraine triggered seizures ([place])    Legally blind    Hypertension    History of diverticulitis    Glucose intolerance (impaired glucose tolerance)    Glaucoma    GERD (gastroesophageal reflux disease)    Complication of anesthesia    Blindness of right eye    Allergy    GI bleed 04/16/2016   Symptomatic anemia 04/16/2016   Hypoxia - on oxygen 02/10/2016   Diabetes mellitus without complication (HCC)    Obesity (BMI 94-85.4) 62/70/3500   Lichen sclerosus 93/81/8299   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 02/07/2016 02/07/2016   Chest pain 11/04/2014   Elevated white blood cell count 11/04/2014   B12 deficiency 05/20/2013   Seizure disorder ([place]) 12/07/2012   Dehydration with hyponatremia 12/07/2012   Atrial fibrillation ([place]) 12/06/2012   Paroxysmal atrial fibrillation ([place]) 12/06/2012   Acute respiratory failure with hypoxia ([place]) 12/06/2012   Gait disorder 06/05/2012   Sjogren's  syndrome ([place])    Hereditary and idiopathic peripheral neuropathy 05/09/2012   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 ([place]) 05/09/2012   History of detached retina repair 02/24/2012   Primary open angle glaucoma of both eyes, severe stage 02/24/2012   ANEMIA, B12 DEFICIENCY 07/19/2009   Anxiety state 01/27/2009   Sleep apnea 12/19/2008   MIXED HYPERLIPIDEMIA 07/18/2008   HYPERTRIGLYCERIDEMIA 08/28/2006   Migraine headache 08/28/2006   Essential hypertension 08/28/2006   GERD 08/28/2006   PSORIASIS 08/28/2006   Osteoarthritis 08/28/2006   OSTEOPOROSIS 08/28/2006    Orientation RESPIRATION BLADDER Height & Weight     Self, Time, Situation, Place  Normal Incontinent Weight: 164 lb 14.5 oz (74.8 kg) Height:     BEHAVIORAL SYMPTOMS/MOOD NEUROLOGICAL BOWEL NUTRITION STATUS      Incontinent Diet (regular)  AMBULATORY STATUS COMMUNICATION OF NEEDS Skin   Extensive Assist Verbally PU Stage and Appropriate Care   PU Stage 2 Dressing:  (sacrum, foam dressing: lift every shift to assess, change PRN)                   Personal Care Assistance Level of Assistance  Bathing, Feeding, Dressing Bathing Assistance: Maximum assistance Feeding assistance: Limited assistance Dressing Assistance: Maximum assistance     Functional Limitations Info  Sight Sight Info: Impaired (legally blind)        SPECIAL CARE FACTORS FREQUENCY  PT (By licensed PT), OT (By licensed OT), Speech therapy  PT Frequency: 5x/wk OT Frequency: 5x/wk     Speech Therapy Frequency: 5x/wk      Contractures Contractures Info: Not present    Additional Factors Info  Code Status, Allergies Code Status Info: Full Allergies Info: Linaclotide, Morphine Sulfate, Oxycodone-aspirin, Codeine, Hydrocodone, Morphine And Related, Oxycodone, Pilocarpine, Rivaroxaban           Current Medications (03/22/2021):  This is the current hospital active medication list Current  Facility-Administered Medications  Medication Dose Route Frequency Provider Last Rate Last Admin   acetaminophen (TYLENOL) tablet 650 mg  650 mg Oral Q4H PRN Donnetta Simpers, MD   650 mg at 03/22/21 1049   Or   acetaminophen (TYLENOL) 160 MG/5ML solution 650 mg  650 mg Per Tube Q4H PRN Donnetta Simpers, MD       Or   acetaminophen (TYLENOL) suppository 650 mg  650 mg Rectal Q4H PRN Donnetta Simpers, MD   650 mg at 03/20/21 0319   antiseptic oral rinse (BIOTENE) solution 15 mL  15 mL Mouth Rinse PRN Donnetta Simpers, MD   15 mL at 03/20/21 2126   diltiazem (CARDIZEM CD) 24 hr capsule 180 mg  180 mg Oral Daily de Yolanda Manges, Cortney E, NP   180 mg at 03/22/21 1049   dorzolamide-timolol (COSOPT) 22.3-6.8 MG/ML ophthalmic solution 1 drop  1 drop Both Eyes BID Donnetta Simpers, MD   1 drop at 03/22/21 1049   Gerhardt's butt cream   Topical BID Shelly Coss, MD   Given at 03/22/21 1049   labetalol (NORMODYNE) injection 5 mg  5 mg Intravenous Q2H PRN Janine Ores, NP       MEDLINE mouth rinse  15 mL Mouth Rinse BID Donnetta Simpers, MD   15 mL at 03/22/21 1049   pantoprazole (PROTONIX) EC tablet 40 mg  40 mg Oral Q1200 Garvin Fila, MD   40 mg at 03/21/21 1156   polyvinyl alcohol (LIQUIFILM TEARS) 1.4 % ophthalmic solution 1 drop  1 drop Both Eyes TID PRN Donnetta Simpers, MD       pregabalin (LYRICA) capsule 100 mg  100 mg Oral BID Donnetta Simpers, MD   100 mg at 03/22/21 1049   senna-docusate (Senokot-S) tablet 1 tablet  1 tablet Oral BID Donnetta Simpers, MD   1 tablet at 03/22/21 1049   sodium chloride flush (NS) 0.9 % injection 3 mL  3 mL Intravenous Once Horton, Barbette Hair, MD         Discharge Medications: Please see discharge summary for a list of discharge medications.  Relevant Imaging Results:  Relevant Lab Results:   Additional Information SS#: [id number]  Geralynn Ochs, LCSW

## 2021-03-22 NOTE — Consult Note (Signed)
° °  [facility] CM Inpatient Consult   03/22/2021  Krystal Clarke 19-Oct-1935 [id number]  [place] [facility] [ACO] Patient: Krystal Clarke Medicare   Primary Care Provider:  Harlan Stains, MD [facility] Physician, [facility] is an embedded provider with a Chronic Care Management team with an external CCM program [not currently showing as CCM active prior to [place] and is listed for the transition of care follow up and appointments.   Patient was screened for referral review for a skilled nursing facility for [facility].  Reached out to inpatient [facility] LCSW secure chat for follow up with [facility] request.  Plan:  Followed up with [facility] with findings.  For questions, please contact:  Natividad Brood, RN BSN [facility] Liaison  [phone] business mobile phone Toll free office [phone]  Fax number: [phone] [email or web address]

## 2021-03-22 NOTE — Progress Notes (Signed)
PROGRESS NOTE  Krystal Clarke  ZOX:[id number] DOB: Nov 11, 1935 DOA: 03/19/2021 PCP: Harlan Stains, MD   Brief Narrative: Krystal Clarke is an 86 y.o. female with a history of glaucoma with resultant blindness, T2DM with neuropathy, Sjogren's syndrome, AFib on eliquis, CVA, HTN who presented after a fall noted to have rightward gaze deviation and left facial droop, found to have right basal ganglia ICH (~11ml) on CT. Eliquis was reversed with andexxa and she was admitted to the neuro-ICU on clevidipine gtt. With neurological improvement/stability, was weaned from infusion, transferred to medical floor under hospitalist service 1/5. PT/OT recommend SNF rehabilitation which is being pursued.   Assessment & Plan: Principal Problem:   ICH (intracerebral hemorrhage) (HCC) Active Problems:   Pressure injury of skin  Right basal ganglia intracerebral hemorrhage: No LVO on CTA. Chronic microhemorrhage in left thalamus. No CES on echo (LVEF 60-65%, mod LAE) - Restart statin at discharge. LDL 118 - Restart DOAC after discharge if stable.  - Maintain SBP <115mmHg.  - SNF rehabilitation pursued, daughter has d/w CSW.  Right neck muscular strain:  - K pad - prn flexeril   PAF:  - Cotninue diltiazem - Plan to restart eliquis if neurologically stable in ~2 weeks per neurology.   T2DM: HbA1c 5.4%.  - Continue SSI, carb-mod diet  HTN: BP at goal.  - Continue diltiazem as above, prn labetalol  MASD:  - Barrier cream ordered. Interdry can also be applied.  Glaucoma:  - Continue gtt's  Peripheral neuropathy:  - Continue lyrica  GERD:  - PPI  DVT prophylaxis: SCDs Code Status: Full Family Communication: Daughter at bedside Disposition Plan:  Status is: Inpatient  Remains inpatient appropriate because: Unsafe DC, requires SNF rehabilitation.   Consultants:  Neurology  Procedures:  None  Antimicrobials: None   Subjective: No new numbness, weakness, speech or vision changes.  Feels very weak diffusely, requires significant assist for bed level ADL.  Objective: Vitals:   03/21/21 2335 03/22/21 0334 03/22/21 0806 03/22/21 1201  BP: 128/68 134/81 (!) 128/100 118/88  Pulse: 76 82 84 73  Resp: (!) 21 (!) 22 19 (!) 21  Temp: 99.2 F (37.3 C) 98.5 F (36.9 C) 98.1 F (36.7 C) 98.5 F (36.9 C)  TempSrc: Oral Oral Oral Oral  SpO2: 93% 94% 94% 92%  Weight:        Intake/Output Summary (Last 24 hours) at 03/22/2021 1805 Last data filed at 03/22/2021 0533 Gross per 24 hour  Intake --  Output 350 ml  Net -350 ml   Filed Weights   03/20/21 0100  Weight: 74.8 kg    Gen: 86 y.o. female in no distress Pulm: Non-labored breathing room air. Clear to auscultation bilaterally.  CV: Regular rate and rhythm. No murmur, rub, or gallop. No JVD, no pitting pedal edema. GI: Abdomen soft, non-tender, non-distended, with normoactive bowel sounds. No organomegaly or masses felt. Ext: Warm, no deformities Skin: Mild MASD to intertriginous folds, predominant bilateral groin. Neuro: Alert and oriented. Globally decreased visual acuity worse on left. Strong rightward gaze preference, unable to pass midline, speech coherent. No focal neurological deficits. Psych: Judgement and insight appear normal. Mood & affect appropriate.   Data Reviewed: I have personally reviewed following labs and imaging studies  CBC: Recent Labs  Lab 03/19/21 2300 03/19/21 2308  WBC 13.2*  --   NEUTROABS 10.3*  --   HGB 15.1* 16.3*  HCT 46.3* 48.0*  MCV 93.7  --   PLT 167  --  Basic Metabolic Panel: Recent Labs  Lab 03/19/21 2300 03/19/21 2308  NA 130* 129*  K 4.0 4.0  CL 96* 96*  CO2 24  --   GLUCOSE 162* 154*  BUN 9 9  CREATININE 0.78 0.60  CALCIUM 9.8  --    GFR: Estimated Creatinine Clearance: 53.9 mL/min (by C-G formula based on SCr of 0.6 mg/dL). Liver Function Tests: Recent Labs  Lab 03/19/21 2300  AST 24  ALT 11  ALKPHOS 76  BILITOT 0.9  PROT 6.4*  ALBUMIN 3.7    No results for input(s): LIPASE, AMYLASE in the last 168 hours. No results for input(s): AMMONIA in the last 168 hours. Coagulation Profile: Recent Labs  Lab 03/19/21 2300  INR 1.1   Cardiac Enzymes: No results for input(s): CKTOTAL, CKMB, CKMBINDEX, TROPONINI in the last 168 hours. BNP (last 3 results) No results for input(s): PROBNP in the last 8760 hours. HbA1C: Recent Labs    03/20/21 0739  HGBA1C 5.4   CBG: Recent Labs  Lab 03/19/21 2305  GLUCAP 164*   Lipid Profile: Recent Labs    03/20/21 0739  CHOL 214*  HDL 59  LDLCALC 118*  TRIG 184*  CHOLHDL 3.6   Thyroid Function Tests: No results for input(s): TSH, T4TOTAL, FREET4, T3FREE, THYROIDAB in the last 72 hours. Anemia Panel: No results for input(s): VITAMINB12, FOLATE, FERRITIN, TIBC, IRON, RETICCTPCT in the last 72 hours. Urine analysis:    Component Value Date/Time   COLORURINE YELLOW 03/06/2020 1600   APPEARANCEUR HAZY (A) 03/06/2020 1600   LABSPEC 1.028 03/06/2020 1600   PHURINE 5.0 03/06/2020 1600   GLUCOSEU NEGATIVE 03/06/2020 1600   HGBUR NEGATIVE 03/06/2020 1600   HGBUR negative 09/25/2009 0928   BILIRUBINUR NEGATIVE 03/06/2020 1600   KETONESUR NEGATIVE 03/06/2020 1600   PROTEINUR NEGATIVE 03/06/2020 1600   UROBILINOGEN 1.0 10/20/2014 1310   NITRITE NEGATIVE 03/06/2020 1600   LEUKOCYTESUR NEGATIVE 03/06/2020 1600   Recent Results (from the past 240 hour(s))  Resp Panel by RT-PCR (Flu A&B, Covid) Nasopharyngeal Swab     Status: None   Collection Time: 03/19/21 11:23 PM   Specimen: Nasopharyngeal Swab; Nasopharyngeal(NP) swabs in vial transport medium  Result Value Ref Range Status   SARS Coronavirus 2 by RT PCR NEGATIVE NEGATIVE Final    Comment: (NOTE) SARS-CoV-2 target nucleic acids are NOT DETECTED.  The SARS-CoV-2 RNA is generally detectable in upper respiratory specimens during the acute phase of infection. The lowest concentration of SARS-CoV-2 viral copies this assay can detect  is 138 copies/mL. A negative result does not preclude SARS-Cov-2 infection and should not be used as the sole basis for treatment or other patient management decisions. A negative result may occur with  improper specimen collection/handling, submission of specimen other than nasopharyngeal swab, presence of viral mutation(s) within the areas targeted by this assay, and inadequate number of viral copies(<138 copies/mL). A negative result must be combined with clinical observations, patient history, and epidemiological information. The expected result is Negative.  Fact Sheet for Patients:  EntrepreneurPulse.com.au  Fact Sheet for Healthcare Providers:  IncredibleEmployment.be  This test is no t yet approved or cleared by the [place] FDA and  has been authorized for detection and/or diagnosis of SARS-CoV-2 by FDA under an Emergency Use Authorization (EUA). This EUA will remain  in effect (meaning this test can be used) for the duration of the COVID-19 declaration under Section 564(b)(1) of the Act, 21 U.S.C.section 360bbb-3(b)(1), unless the authorization is terminated  or revoked sooner.  Influenza A by PCR NEGATIVE NEGATIVE Final   Influenza B by PCR NEGATIVE NEGATIVE Final    Comment: (NOTE) The Xpert Xpress SARS-CoV-2/FLU/RSV plus assay is intended as an aid in the diagnosis of influenza from Nasopharyngeal swab specimens and should not be used as a sole basis for treatment. Nasal washings and aspirates are unacceptable for Xpert Xpress SARS-CoV-2/FLU/RSV testing.  Fact Sheet for Patients: EntrepreneurPulse.com.au  Fact Sheet for Healthcare Providers: IncredibleEmployment.be  This test is not yet approved or cleared by the [place] FDA and has been authorized for detection and/or diagnosis of SARS-CoV-2 by FDA under an Emergency Use Authorization (EUA). This EUA will remain in effect  (meaning this test can be used) for the duration of the COVID-19 declaration under Section 564(b)(1) of the Act, 21 U.S.C. section 360bbb-3(b)(1), unless the authorization is terminated or revoked.  Performed at [facility], [place] [address] [place], [place] [address]   MRSA Next Gen by PCR, Nasal     Status: None   Collection Time: 03/20/21 12:48 AM   Specimen: Nasal Mucosa; Nasal Swab  Result Value Ref Range Status   MRSA by PCR Next Gen NOT DETECTED NOT DETECTED Final    Comment: (NOTE) The GeneXpert MRSA Assay (FDA approved for NASAL specimens only), is one component of a comprehensive MRSA colonization surveillance program. It is not intended to diagnose MRSA infection nor to guide or monitor treatment for MRSA infections. Test performance is not FDA approved in patients less than 40 years old. Performed at [facility], [place] [address], [place], [place] [address]       Radiology Studies: CT ANGIO HEAD NECK W WO CM  Result Date: 03/21/2021 CLINICAL DATA:  Follow-up examination for neuro deficit, stroke suspected. EXAM: CT ANGIOGRAPHY HEAD AND NECK TECHNIQUE: Multidetector CT imaging of the head and neck was performed using the standard protocol during bolus administration of intravenous contrast. Multiplanar CT image reconstructions and MIPs were obtained to evaluate the vascular anatomy. Carotid stenosis measurements (when applicable) are obtained utilizing NASCET criteria, using the distal internal carotid diameter as the denominator. CONTRAST:  31mL OMNIPAQUE IOHEXOL 350 MG/ML SOLN COMPARISON:  Prior MRI and CT from 03/20/2021. FINDINGS: CT HEAD FINDINGS Brain: Intraparenchymal hemorrhage centered at the right basal ganglia is not significantly changed in size and morphology. Mild surrounding edema with partial effacement of the right lateral ventricle, but no other significant regional mass effect. No significant midline shift. No intraventricular extension or other  complication. No other new intracranial hemorrhage or large vessel territory infarct. Underlying atrophy with chronic microvascular ischemic disease again noted. No mass lesion or hydrocephalus. No extra-axial fluid collection. Vascular: No hyperdense vessel. Scattered vascular calcifications noted within the carotid siphons. Skull: Scalp soft tissues and calvarium demonstrate no new finding. Sinuses: Clear. Orbits: Globes orbital soft tissues demonstrate no new finding. Review of the MIP images confirms the above findings CTA NECK FINDINGS Aortic arch: Visualized aortic arch normal in caliber with normal branch pattern. Mild plaque about the arch itself. No stenosis about the origin the great vessels. Right carotid system: Right common and internal carotid arteries are diffusely tortuous but widely patent without stenosis or dissection. Left carotid system: Left common and internal carotid arteries are diffusely tortuous but widely patent without stenosis or dissection. Vertebral arteries: Left vertebral artery arises directly from the aortic arch. Right vertebral artery dominant. Vertebral arteries tortuous but widely patent without stenosis or dissection. Skeleton: No discrete or worrisome osseous lesions. Patient is edentulous. Other  neck: No other soft tissue abnormality within the neck. Chronic fatty atrophy involving the salivary glands noted. Few small thyroid nodules noted, largest of which measures 11 mm on the right. These are of doubtful significance given size and patient age, no follow-up imaging recommended (ref: J Am Coll Radiol. 2015 Feb;12(2): 143-50). Upper chest: Visualized upper chest demonstrates no acute finding. Review of the MIP images confirms the above findings CTA HEAD FINDINGS Anterior circulation: Both internal carotid arteries widely patent to the termini without stenosis. A1 segments widely patent. Normal anterior communicating artery complex. Both anterior cerebral arteries widely  patent to their distal aspects without stenosis. No M1 stenosis or occlusion. Normal MCA bifurcations. Distal MCA branches well perfused and symmetric. Posterior circulation: Both V4 segments patent to the vertebrobasilar junction without stenosis. Both PICA origins patent and normal. Basilar widely patent to its distal aspect without stenosis. Superior cerebellar arteries patent bilaterally. Both PCAs primarily supplied via the basilar and are well perfused to there distal aspects. Venous sinuses: Grossly patent allowing for timing the contrast bolus. Anatomic variants: None significant. No aneurysm. No vascular abnormality seen underlying the right basal ganglia hemorrhage. Review of the MIP images confirms the above findings IMPRESSION: CT HEAD IMPRESSION: 1. No significant interval change in size and appearance of acute right basal ganglia hemorrhage. Similar surrounding edema without significant regional mass effect. 2. No other new acute intracranial abnormality. CTA HEAD AND NECK IMPRESSION: 1. Negative CTA of the head and neck. No large vessel occlusion. Mild atherosclerotic disease for age, with no hemodynamically significant or correctable stenosis. No vascular abnormality seen underlying the right basal ganglia hemorrhage. 2. Diffuse tortuosity of the major arterial vasculature of the head and neck, suggesting chronic underlying hypertension. Electronically Signed   By: Jeannine Boga M.D.   On: 03/21/2021 05:15   DG Ankle Right Port  Result Date: 03/21/2021 CLINICAL DATA:  Trauma, fall EXAM: PORTABLE RIGHT ANKLE - 2 VIEW COMPARISON:  None. FINDINGS: No recent fracture or dislocation is seen. Bony spurs are noted at the tips of medial and lateral malleoli. Ankle mortise is well-maintained. Bony structures in the lateral view are partly obscured by phalanges in the hand used for immobilization. IMPRESSION: No recent fracture or dislocation is seen. Electronically Signed   By: Elmer Picker  M.D.   On: 03/21/2021 14:14    Scheduled Meds:  diltiazem  180 mg Oral Daily   dorzolamide-timolol  1 drop Both Eyes BID   Gerhardt's butt cream   Topical BID   mouth rinse  15 mL Mouth Rinse BID   pantoprazole  40 mg Oral Q1200   pregabalin  100 mg Oral BID   senna-docusate  1 tablet Oral BID   sodium chloride flush  3 mL Intravenous Once   Continuous Infusions:   LOS: 3 days    Patrecia Pour, MD [facility] [email or web address] 03/22/2021, 6:05 PM

## 2021-03-22 NOTE — Progress Notes (Signed)
STROKE TEAM PROGRESS NOTE   INTERVAL HISTORY Patient is seen in her room with her family member at the bedside.    .  Neurological exam is unchanged and vital signs are stable.  Blood pressure adequately controlled.  Therapist recommend rehab in a skilled nursing setting Vitals:   03/21/21 2335 03/22/21 0334 03/22/21 0806 03/22/21 1201  BP: 128/68 134/81 (!) 128/100 118/88  Pulse: 76 82 84 73  Resp: (!) 21 (!) 22 19 (!) 21  Temp: 99.2 F (37.3 C) 98.5 F (36.9 C) 98.1 F (36.7 C) 98.5 F (36.9 C)  TempSrc: Oral Oral Oral Oral  SpO2: 93% 94% 94% 92%  Weight:       CBC:  Recent Labs  Lab 03/19/21 2300 03/19/21 2308  WBC 13.2*  --   NEUTROABS 10.3*  --   HGB 15.1* 16.3*  HCT 46.3* 48.0*  MCV 93.7  --   PLT 167  --    Basic Metabolic Panel:  Recent Labs  Lab 03/19/21 2300 03/19/21 2308  NA 130* 129*  K 4.0 4.0  CL 96* 96*  CO2 24  --   GLUCOSE 162* 154*  BUN 9 9  CREATININE 0.78 0.60  CALCIUM 9.8  --    Lipid Panel:  Recent Labs  Lab 03/20/21 0739  CHOL 214*  TRIG 184*  HDL 59  CHOLHDL 3.6  VLDL 37  LDLCALC 118*   HgbA1c:  Recent Labs  Lab 03/20/21 0739  HGBA1C 5.4   Urine Drug Screen:  Recent Labs  Lab 03/20/21 1141  LABOPIA NONE DETECTED  COCAINSCRNUR NONE DETECTED  LABBENZ NONE DETECTED  AMPHETMU NONE DETECTED  THCU NONE DETECTED  LABBARB NONE DETECTED    Alcohol Level No results for input(s): ETH in the last 168 hours.  IMAGING past 24 hours No results found.  PHYSICAL EXAM General- alert, well-developed, well-nourished elderly Caucasian female in no acute distress.  Hematoma present on right knee   NEURO:  Mental Status: AA&Ox3  Speech/Language: speech is without dysarthria or aphasia.    Cranial Nerves:  II: PERRL. Left visual field cut, does not blink to threat on left III, IV, VI: right gaze deviation but able to look to the left but cannot cross midline V: Sensation is intact to light touch and symmetrical to face.  VII:  left facial droop present VIII: hearing intact to voice. IX, X:  Phonation is normal.  XII: tongue is midline without fasciculations. Motor: 5/5 strength to RUE.  Good antigravity strength in LUE and BLE Sensation- Intact to light touch bilaterally. Extinction absent to light touch to DSS. Gait- deferred   ASSESSMENT/PLAN Krystal Clarke is a 86 y.o. female with history of atrial fibrillation on Eliquis, glaucoma, CM2, sjogren's syndrome, stroke, GERD and HTN  presenting after a fall at home with right gaze deviation and left facial droop.  She was found to have a right basal ganglia ICH about 16 mL in volume.  Eliquis was reversed with Andexxa.  She was admitted to the ICU. Cleviprex is being used to control blood pressure.  ICH:  right basal ganglia secondary to Eliquis use vs. Hemorrhagic conversion of ischemic stroke Code Stroke CT head 16 mL IPH in right lentiform nucleus/external capsule, age-related atrophy and moderate chronic microvascular ischemic disease CTA head & neck no large vessel occlusion or aneurysm.  Mild atherosclerotic changes.   MRI  right basal ganglia IPH, chronic microhemorrhage in left thalamus 2D Echo ejection fraction 60 to 65%.  Moderate left  atrial dilatation.   LDL 118 HgbA1c 5.4 VTE prophylaxis - SCDs    Diet   Diet regular Room service appropriate? Yes with Assist; Fluid consistency: Thin   Eliquis (apixaban) daily prior to admission, now on No antithrombotic secondary to [place] Therapy recommendations: SNF rehab  disposition:  pending  Hypertension Home meds:  cardizem XR 180 mg daily Unstable, requiring Cleviprex SBP <140 Long-term BP goal normotensive  Hyperlipidemia Home meds:  none LDL 118, goal < 70 Add statin at discharge  High intensity statin not indicated at this time due to IPH Continue statin at discharge  Atrial fibrillation Will restart home Cardizem once swallow evaluation passed Consider restarting Eliquis as  outpatient  Other Stroke Risk Factors Advanced Age >/= 98  Former cigarette smoker Hx stroke   Other Active Problems glaucoma  Hospital day # 3  Patient is doing well.  Continue close neurological monitoring and strict blood pressure control with systolic goal below 456.  Continue oral blood pressure medications as well as needed IV labetalol and hydralazine .Continue ongoing physical and Occupational Therapy.   Transfer to rehabilitation and skilled nursing facility in the next few days when bed available.  Greater than 50% time during this 35-minute visit was spent in counseling and coordination of care about her intracerebral hemorrhage and hemiparesis discussion about rehab needs.  Discussed with care team and answered questions     Antony Contras, MD Medical Director [facility] Pager: [phone] 03/22/2021 2:22 PM   To contact Stroke Continuity provider, please refer to [email or web address] After hours, contact General Neurology

## 2021-03-22 NOTE — Care Management Important Message (Signed)
Important Message  Patient Details  Name: Krystal Clarke MRN: [id number] Date of Birth: 25-Nov-1935   Medicare Important Message Given:  Yes     Selen Smucker 03/22/2021, 3:15 PM

## 2021-03-23 NOTE — Progress Notes (Signed)
Occupational Therapy Treatment Patient Details Name: Krystal Clarke MRN: [id number] DOB: 1935/09/18 Today's Date: 03/23/2021   History of present illness Pt is 86 yo female who presents with R gaze and L facial droop. Pt found to have L basal ganglia ICH. PMH: CVA, HTN, seizures, Sjogrens, Afib, HLD.   OT comments  Pt required rolling in the bed multiple times to complete peri care. Pt noted to have skin break down at sacrum and barrier cream applied. Pt static sitting with mod cues with R lateral lean initially. Pt progressed to min guard (A) with repositioning. Pt unable to sustain static sitting. Pt transferred to the chair total+2 total (A) and has a hoyer pad for maximove back to bed with RN / Garment/textile technologist. Recommendation for SNF.   Recommendations for follow up therapy are one component of a multi-disciplinary discharge planning process, led by the attending physician.  Recommendations may be updated based on patient status, additional functional criteria and insurance authorization.    Follow Up Recommendations  Skilled nursing-short term rehab (<3 hours/day)    Assistance Recommended at Discharge Frequent or constant Supervision/Assistance  Patient can return home with the following  A lot of help with walking and/or transfers;A lot of help with bathing/dressing/bathroom;Assistance with cooking/housework;Assistance with feeding;Direct supervision/assist for medications management;Direct supervision/assist for financial management;Assist for transportation;Help with stairs or ramp for entrance   Equipment Recommendations  None recommended by OT    Recommendations for Other Services      Precautions / Restrictions Precautions Precautions: Fall Precaution Comments: painful R ankle       Mobility Bed Mobility Overal bed mobility: Needs Assistance Bed Mobility: Supine to Sit     Supine to sit: Max assist          Transfers Overall transfer level: Needs assistance    Transfers: Sit to/from Stand Sit to Stand: +2 physical assistance;Max assist Stand pivot transfers: +2 physical assistance;Total assist         General transfer comment: use of pad to transfer to the chair     Balance Overall balance assessment: Needs assistance   Sitting balance-Leahy Scale: Fair Sitting balance - Comments: requires max cues for positioning     Standing balance-Leahy Scale: Zero                             ADL either performed or assessed with clinical judgement   ADL Overall ADL's : Needs assistance/impaired Eating/Feeding: Minimal assistance;Bed level   Grooming: Moderate assistance                         Toileting - Clothing Manipulation Details (indicate cue type and reason): total (A) for the peri care. pt was able to rub cream on the R thigh            Extremity/Trunk Assessment              Vision   Additional Comments: r gaze with baseline blindness   Perception     Praxis      Cognition Arousal/Alertness: Awake/alert Behavior During Therapy: WFL for tasks assessed/performed Overall Cognitive Status: No family/caregiver present to determine baseline cognitive functioning                                 General Comments: appropriate during session making funny comments back to therapist.  Exercises     Shoulder Instructions       General Comments VSS    Pertinent Vitals/ Pain       Pain Assessment: Faces Faces Pain Scale: Hurts little more Pain Location: R thigh Pain Descriptors / Indicators: Discomfort Pain Intervention(s): Limited activity within patient's tolerance;Monitored during session;Premedicated before session;Repositioned  Home Living                                          Prior Functioning/Environment              Frequency  Min 2X/week        Progress Toward Goals  OT Goals(current goals can now be found in the care plan  section)  Progress towards OT goals: Progressing toward goals  Acute Rehab OT Goals Patient Stated Goal: to get better OT Goal Formulation: With patient Time For Goal Achievement: 04/04/21 Potential to Achieve Goals: Fair ADL Goals Pt Will Perform Eating: with set-up;sitting Pt Will Perform Grooming: with set-up;with supervision;sitting Pt Will Perform Upper Body Bathing: with set-up;with supervision;sitting Pt Will Perform Lower Body Bathing: with mod assist;sitting/lateral leans;sit to/from stand Pt Will Transfer to Toilet: with +2 assist;with mod assist;bedside commode;stand pivot transfer  Plan Discharge plan remains appropriate    Co-evaluation    PT/OT/SLP Co-Evaluation/Treatment: Yes Reason for Co-Treatment: For patient/therapist safety;To address functional/ADL transfers   OT goals addressed during session: ADL's and self-care;Proper use of Adaptive equipment and DME;Strengthening/ROM      AM-PAC OT "6 Clicks" Daily Activity     Outcome Measure   Help from another person eating meals?: A Little Help from another person taking care of personal grooming?: A Lot Help from another person toileting, which includes using toliet, bedpan, or urinal?: Total Help from another person bathing (including washing, rinsing, drying)?: A Lot Help from another person to put on and taking off regular upper body clothing?: A Lot Help from another person to put on and taking off regular lower body clothing?: Total 6 Click Score: 11    End of Session Equipment Utilized During Treatment: Gait belt  OT Visit Diagnosis: Unsteadiness on feet (R26.81);Other abnormalities of gait and mobility (R26.89);Repeated falls (R29.6);Muscle weakness (generalized) (M62.81);Other symptoms and signs involving the nervous system (R29.898);Hemiplegia and hemiparesis;Pain Hemiplegia - Right/Left: Left Hemiplegia - dominant/non-dominant: Non-Dominant Hemiplegia - caused by: Nontraumatic intracerebral  hemorrhage Pain - Right/Left: Right Pain - part of body: Ankle and joints of foot   Activity Tolerance Patient tolerated treatment well   Patient Left in chair;with call bell/phone within reach;with chair alarm set   Nurse Communication Mobility status;Need for lift equipment;Precautions        Time: 9450-3888 OT Time Calculation (min): 31 min  Charges: OT General Charges $OT Visit: 1 Visit OT Treatments $Self Care/Home Management : 8-22 mins   Brynn, OTR/L  Acute Rehabilitation Services Pager: [phone] Office: [phone] .   Jeri Modena 03/23/2021, 12:06 PM

## 2021-03-23 NOTE — Progress Notes (Addendum)
PROGRESS NOTE  Krystal Clarke  MVH:[id number] DOB: [birth year] DOA: 03/19/2021 PCP: Harlan Stains, MD   Brief Narrative: Krystal Clarke is an 86 y.o. female with a history of glaucoma with resultant blindness, T2DM with neuropathy, Sjogren's syndrome, AFib on eliquis, CVA, HTN who presented after a fall noted to have rightward gaze deviation and left facial droop, found to have right basal ganglia ICH (~47ml) on CT. Eliquis was reversed with andexxa and she was admitted to the neuro-ICU on clevidipine gtt. With neurological improvement/stability, was weaned from infusion, transferred to medical floor under hospitalist service 1/5. PT/OT recommend SNF rehabilitation which is being pursued.   Assessment & Plan: Right basal ganglia intracerebral hemorrhage: No LVO on CTA. Chronic microhemorrhage in left thalamus. No CES on echo (LVEF 60-65%, mod LAE) - Restart statin at discharge. LDL 118 - Restart DOAC after discharge if stable.  - Maintain SBP <188mmHg. Remains at goal. - SNF rehabilitation pursued, daughter has d/w CSW. Insurance authorization is likely to delay transfer.  Right neck muscular strain:  - K pad - prn flexeril   Right ankle bruising: Negative XR, reassuring exam.  - Supportive care, WBAT.  PAF:  - Continue diltiazem - Plan to restart eliquis if neurologically stable in ~2 weeks per neurology.   T2DM: HbA1c 5.4%.  - Continue SSI, carb-mod diet  HTN: BP at goal.  - Continue diltiazem as above, prn labetalol  MASD: Pressure injury of skin is ruled out. - Barrier cream ordered. Interdry can also be applied.  Glaucoma:  - Continue gtt's  Peripheral neuropathy:  - Continue lyrica  GERD:  - PPI  DVT prophylaxis: SCDs Code Status: Full Family Communication: None at bedside Disposition Plan:  Status is: Inpatient  Remains inpatient appropriate because: Unsafe DC, requires SNF rehabilitation.   Consultants:  Neurology  Procedures:   None  Antimicrobials: None   Subjective: Tired but says she's feeling better. No new complaints.   Objective: Vitals:   03/22/21 2002 03/22/21 2320 03/23/21 0330 03/23/21 0717  BP: 135/81 112/72 (!) 118/54 121/84  Pulse: 85 80 93 85  Resp: 20 18 18 18   Temp: 98.3 F (36.8 C) 98.5 F (36.9 C) 97.8 F (36.6 C) 98 F (36.7 C)  TempSrc: Oral Oral Oral Oral  SpO2: 94% 93% 93% 96%  Weight:        Intake/Output Summary (Last 24 hours) at 03/23/2021 0920 Last data filed at 03/23/2021 0331 Gross per 24 hour  Intake --  Output 550 ml  Net -550 ml   Filed Weights   03/20/21 0100  Weight: 74.8 kg   Gen: Elderly female in no distress Pulm: Nonlabored breathing room air. Clear. CV: Regular rate and rhythm. No murmur, rub, or gallop. No JVD, no pitting dependent edema. GI: Abdomen soft, non-tender, non-distended, with normoactive bowel sounds.  Ext: Warm, no deformities Skin: No new rashes, lesions or ulcers on visualized skin. Neuro: Alert and oriented. Decreased visual acuity without new focal neurological deficits. Psych: Judgement and insight appear fair. Mood euthymic & affect congruent. Behavior is appropriate.    Data Reviewed: I have personally reviewed following labs and imaging studies  CBC: Recent Labs  Lab 03/19/21 2300 03/19/21 2308  WBC 13.2*  --   NEUTROABS 10.3*  --   HGB 15.1* 16.3*  HCT 46.3* 48.0*  MCV 93.7  --   PLT 167  --    Basic Metabolic Panel: Recent Labs  Lab 03/19/21 2300 03/19/21 2308  NA 130* 129*  K 4.0  4.0  CL 96* 96*  CO2 24  --   GLUCOSE 162* 154*  BUN 9 9  CREATININE 0.78 0.60  CALCIUM 9.8  --    GFR: Estimated Creatinine Clearance: 53.9 mL/min (by C-G formula based on SCr of 0.6 mg/dL). Liver Function Tests: Recent Labs  Lab 03/19/21 2300  AST 24  ALT 11  ALKPHOS 76  BILITOT 0.9  PROT 6.4*  ALBUMIN 3.7   No results for input(s): LIPASE, AMYLASE in the last 168 hours. No results for input(s): AMMONIA in the last  168 hours. Coagulation Profile: Recent Labs  Lab 03/19/21 2300  INR 1.1   Cardiac Enzymes: No results for input(s): CKTOTAL, CKMB, CKMBINDEX, TROPONINI in the last 168 hours. BNP (last 3 results) No results for input(s): PROBNP in the last 8760 hours. HbA1C: No results for input(s): HGBA1C in the last 72 hours.  CBG: Recent Labs  Lab 03/19/21 2305  GLUCAP 164*   Lipid Profile: No results for input(s): CHOL, HDL, LDLCALC, TRIG, CHOLHDL, LDLDIRECT in the last 72 hours.  Thyroid Function Tests: No results for input(s): TSH, T4TOTAL, FREET4, T3FREE, THYROIDAB in the last 72 hours. Anemia Panel: No results for input(s): VITAMINB12, FOLATE, FERRITIN, TIBC, IRON, RETICCTPCT in the last 72 hours. Urine analysis:    Component Value Date/Time   COLORURINE YELLOW 03/06/2020 1600   APPEARANCEUR HAZY (A) 03/06/2020 1600   LABSPEC 1.028 03/06/2020 1600   PHURINE 5.0 03/06/2020 1600   GLUCOSEU NEGATIVE 03/06/2020 1600   HGBUR NEGATIVE 03/06/2020 1600   HGBUR negative 09/25/2009 0928   BILIRUBINUR NEGATIVE 03/06/2020 1600   KETONESUR NEGATIVE 03/06/2020 1600   PROTEINUR NEGATIVE 03/06/2020 1600   UROBILINOGEN 1.0 10/20/2014 1310   NITRITE NEGATIVE 03/06/2020 1600   LEUKOCYTESUR NEGATIVE 03/06/2020 1600   Recent Results (from the past 240 hour(s))  Resp Panel by RT-PCR (Flu A&B, Covid) Nasopharyngeal Swab     Status: None   Collection Time: 03/19/21 11:23 PM   Specimen: Nasopharyngeal Swab; Nasopharyngeal(NP) swabs in vial transport medium  Result Value Ref Range Status   SARS Coronavirus 2 by RT PCR NEGATIVE NEGATIVE Final    Comment: (NOTE) SARS-CoV-2 target nucleic acids are NOT DETECTED.  The SARS-CoV-2 RNA is generally detectable in upper respiratory specimens during the acute phase of infection. The lowest concentration of SARS-CoV-2 viral copies this assay can detect is 138 copies/mL. A negative result does not preclude SARS-Cov-2 infection and should not be used as the  sole basis for treatment or other patient management decisions. A negative result may occur with  improper specimen collection/handling, submission of specimen other than nasopharyngeal swab, presence of viral mutation(s) within the areas targeted by this assay, and inadequate number of viral copies(<138 copies/mL). A negative result must be combined with clinical observations, patient history, and epidemiological information. The expected result is Negative.  Fact Sheet for Patients:  EntrepreneurPulse.com.au  Fact Sheet for Healthcare Providers:  IncredibleEmployment.be  This test is no t yet approved or cleared by the [place] FDA and  has been authorized for detection and/or diagnosis of SARS-CoV-2 by FDA under an Emergency Use Authorization (EUA). This EUA will remain  in effect (meaning this test can be used) for the duration of the COVID-19 declaration under Section 564(b)(1) of the Act, 21 U.S.C.section 360bbb-3(b)(1), unless the authorization is terminated  or revoked sooner.       Influenza A by PCR NEGATIVE NEGATIVE Final   Influenza B by PCR NEGATIVE NEGATIVE Final    Comment: (NOTE) The Xpert  Xpress SARS-CoV-2/FLU/RSV plus assay is intended as an aid in the diagnosis of influenza from Nasopharyngeal swab specimens and should not be used as a sole basis for treatment. Nasal washings and aspirates are unacceptable for Xpert Xpress SARS-CoV-2/FLU/RSV testing.  Fact Sheet for Patients: EntrepreneurPulse.com.au  Fact Sheet for Healthcare Providers: IncredibleEmployment.be  This test is not yet approved or cleared by the [place] FDA and has been authorized for detection and/or diagnosis of SARS-CoV-2 by FDA under an Emergency Use Authorization (EUA). This EUA will remain in effect (meaning this test can be used) for the duration of the COVID-19 declaration under Section 564(b)(1) of the  Act, 21 U.S.C. section 360bbb-3(b)(1), unless the authorization is terminated or revoked.  Performed at [facility], [place] [address] [place], [place] [address]   MRSA Next Gen by PCR, Nasal     Status: None   Collection Time: 03/20/21 12:48 AM   Specimen: Nasal Mucosa; Nasal Swab  Result Value Ref Range Status   MRSA by PCR Next Gen NOT DETECTED NOT DETECTED Final    Comment: (NOTE) The GeneXpert MRSA Assay (FDA approved for NASAL specimens only), is one component of a comprehensive MRSA colonization surveillance program. It is not intended to diagnose MRSA infection nor to guide or monitor treatment for MRSA infections. Test performance is not FDA approved in patients less than 9 years old. Performed at [facility], [place] [address] [place], [place] [address]       Radiology Studies: DG Ankle Right Port  Result Date: 03/21/2021 CLINICAL DATA:  Trauma, fall EXAM: PORTABLE RIGHT ANKLE - 2 VIEW COMPARISON:  None. FINDINGS: No recent fracture or dislocation is seen. Bony spurs are noted at the tips of medial and lateral malleoli. Ankle mortise is well-maintained. Bony structures in the lateral view are partly obscured by phalanges in the hand used for immobilization. IMPRESSION: No recent fracture or dislocation is seen. Electronically Signed   By: Elmer Picker M.D.   On: 03/21/2021 14:14    Scheduled Meds:  diltiazem  180 mg Oral Daily   dorzolamide-timolol  1 drop Both Eyes BID   Gerhardt's butt cream   Topical BID   mouth rinse  15 mL Mouth Rinse BID   pantoprazole  40 mg Oral Q1200   pregabalin  100 mg Oral BID   senna-docusate  1 tablet Oral BID   sodium chloride flush  3 mL Intravenous Once   Continuous Infusions:   LOS: 4 days    Patrecia Pour, MD [facility] [email or web address] 03/23/2021, 9:20 AM

## 2021-03-23 NOTE — TOC Initial Note (Signed)
Transition of Care [facility]) - Initial/Assessment Note    Patient Details  Name: Krystal Clarke MRN: [id number] Date of Birth: April 13, 1935  [facility] CM/SW Contact:    Geralynn Ochs, LCSW Phone Number: 03/23/2021, 1:35 PM  Clinical Narrative:       CSW met with patient and daughter earlier today to discuss SNF placement. CSW provided information on SNF options in network, and explained limited options available within the patient's insurance network. CSW discussed possibility of changing, and looked up information to provide to daughter. CSW went back to room to provide info to daughter, but daughter was not at bedside. CSW called and left a voicemail, awaiting call back. Patient has been faxed out for SNF, pending bed offers. CSW to follow.            Expected Discharge Plan: Skilled Nursing Facility Barriers to Discharge: Continued Medical Work up, Ship broker   Patient Goals and CMS Choice Patient states their goals for this hospitalization and ongoing recovery are:: to get rehab CMS Medicare.gov Compare Post Acute Care list provided to:: Patient Represenative (must comment) Choice offered to / list presented to : Adult Children  Expected Discharge Plan and Services Expected Discharge Plan: [place] Acute Care Choice: [place] arrangements for the past 2 months: Single Family Home                                      Prior Living Arrangements/Services Living arrangements for the past 2 months: Single Family Home Lives with:: Spouse Patient language and need for interpreter reviewed:: No Do you feel safe going back to the place where you live?: Yes      Need for Family Participation in Patient Care: No (Comment) Care giver support system in place?: No (comment)   Criminal Activity/Legal Involvement Pertinent to Current Situation/Hospitalization: No - Comment as needed  Activities of Daily  Living      Permission Sought/Granted Permission sought to share information with : Customer service manager, Family Supports Permission granted to share information with : Yes, Verbal Permission Granted  Share Information with NAME: Kenn File  Permission granted to share info w AGENCY: SNF  Permission granted to share info w Relationship: Daughter, Spouse     Emotional Assessment Appearance:: Appears stated age Attitude/Demeanor/Rapport: Engaged Affect (typically observed): Appropriate Orientation: : Oriented to Self, Oriented to Place, Oriented to  Time, Oriented to Situation Alcohol / Substance Use: Not Applicable Psych Involvement: No (comment)  Admission diagnosis:  Hemorrhagic stroke ([place]) [I61.9] ICH (intracerebral hemorrhage) ([place]) [I61.9] Patient Active Problem List   Diagnosis Date Noted   Pressure injury of skin 03/22/2021   ICH (intracerebral hemorrhage) ([place]) 03/19/2021   Stroke ([place])    Siriasis    PONV (postoperative nausea and vomiting)    Pneumonia    Peripheral neuropathy    OP (osteoporosis)    Neuropathy    Migraines    Migraine triggered seizures ([place])    Legally blind    Hypertension    History of diverticulitis    Glucose intolerance (impaired glucose tolerance)    Glaucoma    GERD (gastroesophageal reflux disease)    Complication of anesthesia    Blindness of right eye    Allergy    GI bleed 04/16/2016   Symptomatic anemia 04/16/2016   Hypoxia - on oxygen 02/10/2016  Diabetes mellitus without complication ([place])    Obesity (BMI 19-14.7) 82/95/6213   Lichen sclerosus 08/65/7846   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 02/07/2016 02/07/2016   Chest pain 11/04/2014   Elevated white blood cell count 11/04/2014   B12 deficiency 05/20/2013   Seizure disorder ([place]) 12/07/2012   Dehydration with hyponatremia 12/07/2012   Atrial fibrillation ([place]) 12/06/2012   Paroxysmal atrial fibrillation ([place]) 12/06/2012    Acute respiratory failure with hypoxia ([place]) 12/06/2012   Gait disorder 06/05/2012   Sjogren's syndrome ([place])    Hereditary and idiopathic peripheral neuropathy 05/09/2012   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 ([place]) 05/09/2012   History of detached retina repair 02/24/2012   Primary open angle glaucoma of both eyes, severe stage 02/24/2012   ANEMIA, B12 DEFICIENCY 07/19/2009   Anxiety state 01/27/2009   Sleep apnea 12/19/2008   MIXED HYPERLIPIDEMIA 07/18/2008   HYPERTRIGLYCERIDEMIA 08/28/2006   Migraine headache 08/28/2006   Essential hypertension 08/28/2006   GERD 08/28/2006   PSORIASIS 08/28/2006   Osteoarthritis 08/28/2006   OSTEOPOROSIS 08/28/2006   PCP:  Harlan Stains, MD Pharmacy:   [id number] [id number]: [phone]: [phone]    Social Determinants of Health (SDOH) Interventions    Readmission Risk Interventions No flowsheet data found.

## 2021-03-23 NOTE — Progress Notes (Signed)
Physical Therapy Treatment Patient Details Name: Krystal Clarke MRN: [id number] DOB: 03/11/1936 Today's Date: 03/23/2021   History of Present Illness Pt is 86 yo female who presents with R gaze and L facial droop. Pt found to have L basal ganglia ICH. PMH: CVA, HTN, seizures, Sjogrens, Afib, HLD.    PT Comments    Pt continues to require maxA for bed mobility and maxAx2 for OOB transfer to chair. Pt unable to stand without maxA or WB on LEs to hold self up. Pt remains alert and oriented and give great effort during therapy session. Pt continues to benefit from SNF upon d/c for maximal functional recovery.    Recommendations for follow up therapy are one component of a multi-disciplinary discharge planning process, led by the attending physician.  Recommendations may be updated based on patient status, additional functional criteria and insurance authorization.  Follow Up Recommendations  Skilled nursing-short term rehab (<3 hours/day)     Assistance Recommended at Discharge Frequent or constant Supervision/Assistance  Patient can return home with the following Two people to help with walking and/or transfers;Two people to help with bathing/dressing/bathroom;Assistance with cooking/housework;Direct supervision/assist for medications management;Direct supervision/assist for financial management;Assist for transportation;Help with stairs or ramp for entrance   Equipment Recommendations   (TBD at next venue)    Recommendations for Other Services       Precautions / Restrictions Precautions Precautions: Fall Precaution Comments: painful R ankle Restrictions Weight Bearing Restrictions: No     Mobility  Bed Mobility Overal bed mobility: Needs Assistance Bed Mobility: Supine to Sit     Supine to sit: Max assist;HOB elevated     General bed mobility comments: pt with some initiation of LEs to EOB and reaching for UEs however ultimately required maxA for trunk elevation and to  bring hips to EOB    Transfers Overall transfer level: Needs assistance Equipment used: 2 person hand held assist (face to face transfer with use of gait belt and bed pad) Transfers: Sit to/from Stand Sit to Stand: +2 physical assistance;Max assist Stand pivot transfers: +2 physical assistance;Total assist         General transfer comment: use of pad to transfer to the chair    Ambulation/Gait               General Gait Details: unable this date   Stairs             Wheelchair Mobility    Modified Rankin (Stroke Patients Only)       Balance Overall balance assessment: Needs assistance   Sitting balance-Leahy Scale: Fair Sitting balance - Comments: requires max cues for positioning     Standing balance-Leahy Scale: Zero                              Cognition Arousal/Alertness: Awake/alert Behavior During Therapy: WFL for tasks assessed/performed Overall Cognitive Status: No family/caregiver present to determine baseline cognitive functioning                                 General Comments: appropriate during session making funny comments back to therapist, able to recall working with therapy earlier this week however not getting up in chair        Exercises      General Comments General comments (skin integrity, edema, etc.): VSS      Pertinent Vitals/Pain Pain Assessment:  Faces Faces Pain Scale: Hurts little more Pain Location: R thigh Pain Descriptors / Indicators: Discomfort Pain Intervention(s): Limited activity within patient's tolerance    Home Living                          Prior Function            PT Goals (current goals can now be found in the care plan section) Acute Rehab PT Goals PT Goal Formulation: With patient Time For Goal Achievement: 04/04/21 Potential to Achieve Goals: Good Progress towards PT goals: Progressing toward goals    Frequency    Min 3X/week      PT  Plan Current plan remains appropriate    Co-evaluation PT/OT/SLP Co-Evaluation/Treatment: Yes Reason for Co-Treatment: For patient/therapist safety;To address functional/ADL transfers PT goals addressed during session: Mobility/safety with mobility OT goals addressed during session: ADL's and self-care;Proper use of Adaptive equipment and DME;Strengthening/ROM      AM-PAC PT "6 Clicks" Mobility   Outcome Measure  Help needed turning from your back to your side while in a flat bed without using bedrails?: Total Help needed moving from lying on your back to sitting on the side of a flat bed without using bedrails?: Total Help needed moving to and from a bed to a chair (including a wheelchair)?: Total Help needed standing up from a chair using your arms (e.g., wheelchair or bedside chair)?: Total Help needed to walk in hospital room?: Total Help needed climbing 3-5 steps with a railing? : Total 6 Click Score: 6    End of Session Equipment Utilized During Treatment: Gait belt Activity Tolerance: Patient tolerated treatment well Patient left: in chair;with call bell/phone within reach;with chair alarm set Nurse Communication: Mobility status;Need for lift equipment (maxi sky to return to bed) PT Visit Diagnosis: Unsteadiness on feet (R26.81);Other abnormalities of gait and mobility (R26.89);Muscle weakness (generalized) (M62.81)     Time: 8768-1157 PT Time Calculation (min) (ACUTE ONLY): 32 min  Charges:  $Therapeutic Activity: 8-22 mins                     Kittie Plater, PT, DPT Acute Rehabilitation Services Pager #: [phone] Office #: [phone]    Berline Lopes 03/23/2021, 1:11 PM

## 2021-03-23 NOTE — TOC Progression Note (Addendum)
Transition of Care [facility]) - Progression Note    Patient Details  Name: Krystal Clarke MRN: [id number] Date of Birth: July 13, 1935  Transition of Care [facility]) CM/SW [place], [place] Phone Number: 03/23/2021, 1:36 PM  Clinical Narrative:   CSW received a message after hours that patient and family interested in [facility] for SNF. CSW spoke with patient's spouse via phone to update that [facility] is out of network, and provide only bed offer at [facility]. Spouse asked for time to discuss with his daughter and to get back to CSW. CSW received a call from daughter to discuss, and they absolutely do not want [facility]. CSW provided names of other SNF in network that are still pending, family to review. CSW updated that patient is medically stable, awaiting placement, and family indicated understanding. Family to call back with SNF choice.  UPDATE 4:44: CSW received a call from [facility] asking about patient, needing information faxed over as there's no hub access. CSW faxed information and asked if the family had chosen, and [place] said that [facility] had called to say that the family wanted them to accept. CSW reached out to patient's daughter to discuss, as family had not updated CSW with information. Patient's spouse was confused and thought that [facility] was a different facility, the family was going to choose [facility]. CSW spoke with [facility] to confirm that they could accept, and they can admit the patient on Monday. CSW updated family. CSW to follow.   Expected Discharge Plan: [place] Barriers to Discharge: Continued Medical Work up, Ship broker  Expected Discharge Plan and Services Expected Discharge Plan: [place] Choice: [place] arrangements for the past 2 months: Single Family Home                                       Social Determinants of Health (SDOH)  Interventions    Readmission Risk Interventions No flowsheet data found.

## 2021-03-23 NOTE — Progress Notes (Addendum)
STROKE TEAM PROGRESS NOTE   INTERVAL HISTORY Patient is seen in her room with no one at the bedside.    .  She is awake alert and interactive.  Neurological exam is unchanged and vital signs are stable.  Blood pressure adequately controlled.  Therapist recommend rehab in a skilled nursing setting Vitals:   03/22/21 2320 03/23/21 0330 03/23/21 0717 03/23/21 1106  BP: 112/72 (!) 118/54 121/84 108/82  Pulse: 80 93 85 88  Resp: 18 18 18 17   Temp: 98.5 F (36.9 C) 97.8 F (36.6 C) 98 F (36.7 C)   TempSrc: Oral Oral Oral   SpO2: 93% 93% 96% 96%  Weight:       CBC:  Recent Labs  Lab 03/19/21 2300 03/19/21 2308  WBC 13.2*  --   NEUTROABS 10.3*  --   HGB 15.1* 16.3*  HCT 46.3* 48.0*  MCV 93.7  --   PLT 167  --    Basic Metabolic Panel:  Recent Labs  Lab 03/19/21 2300 03/19/21 2308  NA 130* 129*  K 4.0 4.0  CL 96* 96*  CO2 24  --   GLUCOSE 162* 154*  BUN 9 9  CREATININE 0.78 0.60  CALCIUM 9.8  --    Lipid Panel:  Recent Labs  Lab 03/20/21 0739  CHOL 214*  TRIG 184*  HDL 59  CHOLHDL 3.6  VLDL 37  LDLCALC 118*   HgbA1c:  Recent Labs  Lab 03/20/21 0739  HGBA1C 5.4   Urine Drug Screen:  Recent Labs  Lab 03/20/21 1141  LABOPIA NONE DETECTED  COCAINSCRNUR NONE DETECTED  LABBENZ NONE DETECTED  AMPHETMU NONE DETECTED  THCU NONE DETECTED  LABBARB NONE DETECTED    Alcohol Level No results for input(s): ETH in the last 168 hours.  IMAGING past 24 hours No results found.  PHYSICAL EXAM General- alert, well-developed, well-nourished elderly Caucasian female in no acute distress.  Hematoma present on right knee   NEURO:  Mental Status: AA&Ox3  Speech/Language: speech is without dysarthria or aphasia.    Cranial Nerves:  II: PERRL. Left visual field cut, does not blink to threat on left III, IV, VI: right gaze deviation but able to look to the left but cannot cross midline V: Sensation is intact to light touch and symmetrical to face.  VII: left facial  droop present VIII: hearing intact to voice. IX, X:  Phonation is normal.  XII: tongue is midline without fasciculations. Motor: 5/5 strength to RUE.  Good antigravity strength in LUE and BLE Sensation- Intact to light touch bilaterally. Extinction absent to light touch to DSS. Gait- deferred   ASSESSMENT/PLAN Krystal Clarke is a 86 y.o. female with history of atrial fibrillation on Eliquis, glaucoma, CM2, sjogren's syndrome, stroke, GERD and HTN  presenting after a fall at home with right gaze deviation and left facial droop.  She was found to have a right basal ganglia ICH about 16 mL in volume.  Eliquis was reversed with Andexxa.  She was admitted to the ICU. Cleviprex is being used to control blood pressure.  ICH:  right basal ganglia secondary to Eliquis use vs. Hemorrhagic conversion of ischemic stroke Code Stroke CT head 16 mL IPH in right lentiform nucleus/external capsule, age-related atrophy and moderate chronic microvascular ischemic disease CTA head & neck no large vessel occlusion or aneurysm.  Mild atherosclerotic changes.   MRI  right basal ganglia IPH, chronic microhemorrhage in left thalamus 2D Echo ejection fraction 60 to 65%.  Moderate left  atrial dilatation.   LDL 118 HgbA1c 5.4 VTE prophylaxis - SCDs    Diet   Diet regular Room service appropriate? Yes with Assist; Fluid consistency: Thin   Eliquis (apixaban) daily prior to admission, now on No antithrombotic secondary to [place] Therapy recommendations: SNF rehab  disposition:  pending  Hypertension Home meds:  cardizem XR 180 mg daily Unstable, requiring Cleviprex SBP <140 Long-term BP goal normotensive  Hyperlipidemia Home meds:  none LDL 118, goal < 70 Add statin at discharge  High intensity statin not indicated at this time due to IPH Continue statin at discharge  Atrial fibrillation Will restart home Cardizem once swallow evaluation passed Consider restarting Eliquis as outpatient  Other  Stroke Risk Factors Advanced Age >/= 68  Former cigarette smoker Hx stroke   Other Active Problems glaucoma  Hospital day # 4  Patient continues to do well.  Continue close neurological monitoring and strict blood pressure control with systolic goal below 394.  Marland KitchenContinue ongoing physical and Occupational Therapy.   Transfer to rehabilitation and skilled nursing facility in the next few days when bed available.  Greater than 50% time during this 35-minute visit was spent in counseling and coordination of care about her intracerebral hemorrhage and hemiparesis discussion about rehab needs  with care team and answered questions Stroke Team will sign off. Kindly call for questions if any.    Antony Contras, MD Medical Director [facility] Stroke Center Pager: [phone] 03/23/2021 3:48 PM   To contact Stroke Continuity provider, please refer to [email or web address] After hours, contact General Neurology

## 2021-03-24 NOTE — Progress Notes (Signed)
PROGRESS NOTE  Krystal Clarke  EHM:[id number] DOB: 1935/08/04 DOA: 03/19/2021 PCP: Harlan Stains, MD   Brief Narrative: Krystal Clarke is an 86 y.o. female with a history of glaucoma with resultant blindness, T2DM with neuropathy, Sjogren's syndrome, AFib on eliquis, CVA, HTN who presented after a fall noted to have rightward gaze deviation and left facial droop, found to have right basal ganglia ICH (~95ml) on CT. Eliquis was reversed with andexxa and she was admitted to the neuro-ICU on clevidipine gtt. With neurological improvement/stability, was weaned from infusion, transferred to medical floor under hospitalist service 1/5. PT/OT recommend SNF rehabilitation which is being pursued.   Assessment & Plan: Right basal ganglia intracerebral hemorrhage: No LVO on CTA. Chronic microhemorrhage in left thalamus. No CES on echo (LVEF 60-65%, mod LAE) - Restart statin at discharge. LDL 118 - Restart DOAC after discharge if stable.  - Maintain SBP <132mmHg. Remains at goal. - SNF rehabilitation pursued, though insurance authorization will delay DC to at least 1/9.  Right neck muscular strain: Improving per pt. - K pad - prn flexeril   Right ankle bruising: Negative XR, reassuring exam.  - Supportive care, WBAT.  PAF:  - Continue diltiazem - Plan to restart eliquis if neurologically stable in ~2 weeks per neurology.   T2DM: HbA1c 5.4%. No SSI indicated.  HTN: BP at goal.  - Continue diltiazem as above, prn labetalol  MASD: Pressure injury of skin is ruled out. - Barrier cream ordered. Interdry can also be applied.  Glaucoma:  - Continue gtt's  Peripheral neuropathy:  - Continue lyrica  GERD:  - PPI  DVT prophylaxis: SCDs Code Status: Full Family Communication: None at bedside Disposition Plan:  Status is: Inpatient  Remains inpatient appropriate because: Unsafe DC, requires SNF rehabilitation.   Consultants:  Neurology  Procedures:  None  Antimicrobials: None    Subjective: Says she's feeling better in general. Remains diffusely very weak without any new complaints.   Objective: Vitals:   03/24/21 0009 03/24/21 0336 03/24/21 0834 03/24/21 1141  BP: (!) 121/52 114/63 119/86 (!) 127/91  Pulse: 68 94 87 89  Resp: 16 18 19 20   Temp: 99.1 F (37.3 C) 99.9 F (37.7 C) 98.8 F (37.1 C)   TempSrc: Oral Oral Oral   SpO2: 96% 94% 97% 96%  Weight:        Intake/Output Summary (Last 24 hours) at 03/24/2021 1335 Last data filed at 03/24/2021 1141 Gross per 24 hour  Intake 360 ml  Output 500 ml  Net -140 ml   Filed Weights   03/20/21 0100  Weight: 74.8 kg   Gen: Elderly, very pleasant female in no distress Pulm: Nonlabored breathing room air. Clear. CV: Regular rate and rhythm. No murmur, rub, or gallop. No JVD, no pitting dependent edema. GI: Abdomen soft, non-tender, non-distended, with normoactive bowel sounds.  Ext: Warm, no deformities. SCDs in place, strength and sensation intact in extremities. Skin: No new rashes, lesions or ulcers on visualized skin. Neuro: Alert and oriented. Decreased visual acuity, does not cross midline from rightward gaze. No new focal neurological deficits. Psych: Judgement and insight appear fair. Mood euthymic & affect congruent. Behavior is appropriate.    Data Reviewed: I have personally reviewed following labs and imaging studies  CBC: Recent Labs  Lab 03/19/21 2300 03/19/21 2308  WBC 13.2*  --   NEUTROABS 10.3*  --   HGB 15.1* 16.3*  HCT 46.3* 48.0*  MCV 93.7  --   PLT 167  --  Basic Metabolic Panel: Recent Labs  Lab 03/19/21 2300 03/19/21 2308  NA 130* 129*  K 4.0 4.0  CL 96* 96*  CO2 24  --   GLUCOSE 162* 154*  BUN 9 9  CREATININE 0.78 0.60  CALCIUM 9.8  --    GFR: Estimated Creatinine Clearance: 53.9 mL/min (by C-G formula based on SCr of 0.6 mg/dL). Liver Function Tests: Recent Labs  Lab 03/19/21 2300  AST 24  ALT 11  ALKPHOS 76  BILITOT 0.9  PROT 6.4*  ALBUMIN 3.7    No results for input(s): LIPASE, AMYLASE in the last 168 hours. No results for input(s): AMMONIA in the last 168 hours. Coagulation Profile: Recent Labs  Lab 03/19/21 2300  INR 1.1   Cardiac Enzymes: No results for input(s): CKTOTAL, CKMB, CKMBINDEX, TROPONINI in the last 168 hours. BNP (last 3 results) No results for input(s): PROBNP in the last 8760 hours. HbA1C: No results for input(s): HGBA1C in the last 72 hours.  CBG: Recent Labs  Lab 03/19/21 2305  GLUCAP 164*   Lipid Profile: No results for input(s): CHOL, HDL, LDLCALC, TRIG, CHOLHDL, LDLDIRECT in the last 72 hours.  Thyroid Function Tests: No results for input(s): TSH, T4TOTAL, FREET4, T3FREE, THYROIDAB in the last 72 hours. Anemia Panel: No results for input(s): VITAMINB12, FOLATE, FERRITIN, TIBC, IRON, RETICCTPCT in the last 72 hours. Urine analysis:    Component Value Date/Time   COLORURINE YELLOW 03/06/2020 1600   APPEARANCEUR HAZY (A) 03/06/2020 1600   LABSPEC 1.028 03/06/2020 1600   PHURINE 5.0 03/06/2020 1600   GLUCOSEU NEGATIVE 03/06/2020 1600   HGBUR NEGATIVE 03/06/2020 1600   HGBUR negative 09/25/2009 0928   BILIRUBINUR NEGATIVE 03/06/2020 1600   KETONESUR NEGATIVE 03/06/2020 1600   PROTEINUR NEGATIVE 03/06/2020 1600   UROBILINOGEN 1.0 10/20/2014 1310   NITRITE NEGATIVE 03/06/2020 1600   LEUKOCYTESUR NEGATIVE 03/06/2020 1600   Recent Results (from the past 240 hour(s))  Resp Panel by RT-PCR (Flu A&B, Covid) Nasopharyngeal Swab     Status: None   Collection Time: 03/19/21 11:23 PM   Specimen: Nasopharyngeal Swab; Nasopharyngeal(NP) swabs in vial transport medium  Result Value Ref Range Status   SARS Coronavirus 2 by RT PCR NEGATIVE NEGATIVE Final    Comment: (NOTE) SARS-CoV-2 target nucleic acids are NOT DETECTED.  The SARS-CoV-2 RNA is generally detectable in upper respiratory specimens during the acute phase of infection. The lowest concentration of SARS-CoV-2 viral copies this assay can  detect is 138 copies/mL. A negative result does not preclude SARS-Cov-2 infection and should not be used as the sole basis for treatment or other patient management decisions. A negative result may occur with  improper specimen collection/handling, submission of specimen other than nasopharyngeal swab, presence of viral mutation(s) within the areas targeted by this assay, and inadequate number of viral copies(<138 copies/mL). A negative result must be combined with clinical observations, patient history, and epidemiological information. The expected result is Negative.  Fact Sheet for Patients:  EntrepreneurPulse.com.au  Fact Sheet for Healthcare Providers:  IncredibleEmployment.be  This test is no t yet approved or cleared by the [place] FDA and  has been authorized for detection and/or diagnosis of SARS-CoV-2 by FDA under an Emergency Use Authorization (EUA). This EUA will remain  in effect (meaning this test can be used) for the duration of the COVID-19 declaration under Section 564(b)(1) of the Act, 21 U.S.C.section 360bbb-3(b)(1), unless the authorization is terminated  or revoked sooner.       Influenza A by PCR NEGATIVE  NEGATIVE Final   Influenza B by PCR NEGATIVE NEGATIVE Final    Comment: (NOTE) The Xpert Xpress SARS-CoV-2/FLU/RSV plus assay is intended as an aid in the diagnosis of influenza from Nasopharyngeal swab specimens and should not be used as a sole basis for treatment. Nasal washings and aspirates are unacceptable for Xpert Xpress SARS-CoV-2/FLU/RSV testing.  Fact Sheet for Patients: EntrepreneurPulse.com.au  Fact Sheet for Healthcare Providers: IncredibleEmployment.be  This test is not yet approved or cleared by the [place] FDA and has been authorized for detection and/or diagnosis of SARS-CoV-2 by FDA under an Emergency Use Authorization (EUA). This EUA will remain in  effect (meaning this test can be used) for the duration of the COVID-19 declaration under Section 564(b)(1) of the Act, 21 U.S.C. section 360bbb-3(b)(1), unless the authorization is terminated or revoked.  Performed at [facility], [place] [address], [place], [place] [address]   MRSA Next Gen by PCR, Nasal     Status: None   Collection Time: 03/20/21 12:48 AM   Specimen: Nasal Mucosa; Nasal Swab  Result Value Ref Range Status   MRSA by PCR Next Gen NOT DETECTED NOT DETECTED Final    Comment: (NOTE) The GeneXpert MRSA Assay (FDA approved for NASAL specimens only), is one component of a comprehensive MRSA colonization surveillance program. It is not intended to diagnose MRSA infection nor to guide or monitor treatment for MRSA infections. Test performance is not FDA approved in patients less than 15 years old. Performed at [facility], [place] [address], [place], [place] [address]       Radiology Studies: No results found.  Scheduled Meds:  diltiazem  180 mg Oral Daily   dorzolamide-timolol  1 drop Both Eyes BID   Gerhardt's butt cream   Topical BID   mouth rinse  15 mL Mouth Rinse BID   pantoprazole  40 mg Oral Q1200   pregabalin  100 mg Oral BID   senna-docusate  1 tablet Oral BID   sodium chloride flush  3 mL Intravenous Once   Continuous Infusions:   LOS: 5 days    Patrecia Pour, MD [facility] [email or web address] 03/24/2021, 1:35 PM

## 2021-03-25 LAB — CBC
HCT: 45.8 % (ref 36.0–46.0)
Hemoglobin: 15 g/dL (ref 12.0–15.0)
MCH: 29.9 pg (ref 26.0–34.0)
MCHC: 32.8 g/dL (ref 30.0–36.0)
MCV: 91.4 fL (ref 80.0–100.0)
Platelets: 198 10*3/uL (ref 150–400)
RBC: 5.01 MIL/uL (ref 3.87–5.11)
RDW: 14.2 % (ref 11.5–15.5)
WBC: 12.1 10*3/uL — ABNORMAL HIGH (ref 4.0–10.5)
nRBC: 0 % (ref 0.0–0.2)

## 2021-03-25 LAB — BASIC METABOLIC PANEL
Anion gap: 10 (ref 5–15)
BUN: 17 mg/dL (ref 8–23)
CO2: 24 mmol/L (ref 22–32)
Calcium: 9.5 mg/dL (ref 8.9–10.3)
Chloride: 94 mmol/L — ABNORMAL LOW (ref 98–111)
Creatinine, Ser: 0.76 mg/dL (ref 0.44–1.00)
GFR, Estimated: >60 mL/min (ref >60)
Glucose, Bld: 128 mg/dL — ABNORMAL HIGH (ref 70–99)
Potassium: 3.9 mmol/L (ref 3.5–5.1)
Sodium: 128 mmol/L — ABNORMAL LOW (ref 135–145)

## 2021-03-25 LAB — SARS CORONAVIRUS 2 (TAT 6-24 HRS): SARS Coronavirus 2: NEGATIVE

## 2021-03-25 MED ORDER — SODIUM CHLORIDE 0.9 % IV SOLN: INTRAVENOUS | Status: DC

## 2021-03-25 NOTE — Progress Notes (Signed)
PROGRESS NOTE  Krystal Clarke  DZH:[id number] DOB: [birth year] DOA: 03/19/2021 PCP: Harlan Stains, MD   Brief Narrative: Krystal Clarke is an 86 y.o. female with a history of glaucoma with resultant blindness, T2DM with neuropathy, Sjogren's syndrome, AFib on eliquis, CVA, HTN who presented after a fall noted to have rightward gaze deviation and left facial droop, found to have right basal ganglia ICH (~55ml) on CT. Eliquis was reversed with andexxa and she was admitted to the neuro-ICU on clevidipine gtt. With neurological improvement/stability, was weaned from infusion, transferred to medical floor under hospitalist service 1/5. PT/OT recommend SNF rehabilitation which is being pursued.   Assessment & Plan: Right basal ganglia intracerebral hemorrhage: No LVO on CTA. Chronic microhemorrhage in left thalamus. No CES on echo (LVEF 60-65%, mod LAE) - Restart statin at discharge. LDL 118 - Restart DOAC after discharge if stable.  - Maintain SBP <153mmHg. Remains at goal. - SNF rehabilitation pursued, though insurance authorization will delay DC to at least 1/9. Covid screen ordered in preparation today.  Right neck muscular strain: Improved - K pad - prn flexeril. Has not required this since 1/5.  Right ankle bruising: Negative XR, reassuring exam.  - Supportive care, WBAT.  Persistent atrial fibrillation: No longer appears to be paroxysmal, though rate remains controlled throughout hospitalization thus far. - Continue diltiazem - Plan to restart eliquis if neurologically stable in ~2 weeks per neurology.   T2DM: HbA1c 5.4%. No SSI indicated.  HTN: BP at goal.  - Continue diltiazem as above, prn labetalol  MASD: Pressure injury of skin is ruled out. - Barrier cream ordered. Interdry can also be applied.  Glaucoma:  - Continue gtt's  Peripheral neuropathy:  - Continue lyrica  GERD:  - PPI  DVT prophylaxis: SCDs Code Status: Full Family Communication: None at  bedside Disposition Plan:  Status is: Inpatient  Remains inpatient appropriate because: Unsafe DC, requires SNF rehabilitation.   Consultants:  Neurology  Procedures:  None  Antimicrobials: None   Subjective: No new complaints. No pain to speak of, eating better.  Objective: Vitals:   03/24/21 1942 03/24/21 2326 03/25/21 0403 03/25/21 0827  BP: 129/75 129/75 104/71 116/70  Pulse: 80 92 (!) 56 69  Resp: 18 18 16 16   Temp: 97.8 F (36.6 C) 98.6 F (37 C) (!) 97.3 F (36.3 C) 97.8 F (36.6 C)  TempSrc: Axillary Oral Axillary Axillary  SpO2: 98% 97% 95% 93%  Weight:        Intake/Output Summary (Last 24 hours) at 03/25/2021 1103 Last data filed at 03/25/2021 0406 Gross per 24 hour  Intake 60 ml  Output 400 ml  Net -340 ml   Filed Weights   03/20/21 0100  Weight: 74.8 kg   Gen: Pleasant, elderly female in no distress Pulm: Nonlabored breathing room air. Clear. CV: Irreg irreg with rate in 60's. No murmur, rub, or gallop. No JVD, no significant pitting dependent edema. GI: Abdomen soft, non-tender, non-distended, with normoactive bowel sounds.  Ext: Warm, no deformities Skin: No new rashes, lesions or ulcers on visualized skin. Neuro: Alert and oriented. Rightward gaze deviation, no new focal neurological deficits. Psych: Judgement and insight appear intact. Mood euthymic & affect congruent. Behavior is appropriate.    Data Reviewed: I have personally reviewed following labs and imaging studies  CBC: Recent Labs  Lab 03/19/21 2300 03/19/21 2308  WBC 13.2*  --   NEUTROABS 10.3*  --   HGB 15.1* 16.3*  HCT 46.3* 48.0*  MCV 93.7  --  PLT 167  --    Basic Metabolic Panel: Recent Labs  Lab 03/19/21 2300 03/19/21 2308  NA 130* 129*  K 4.0 4.0  CL 96* 96*  CO2 24  --   GLUCOSE 162* 154*  BUN 9 9  CREATININE 0.78 0.60  CALCIUM 9.8  --    GFR: Estimated Creatinine Clearance: 53.9 mL/min (by C-G formula based on SCr of 0.6 mg/dL). Liver Function  Tests: Recent Labs  Lab 03/19/21 2300  AST 24  ALT 11  ALKPHOS 76  BILITOT 0.9  PROT 6.4*  ALBUMIN 3.7   No results for input(s): LIPASE, AMYLASE in the last 168 hours. No results for input(s): AMMONIA in the last 168 hours. Coagulation Profile: Recent Labs  Lab 03/19/21 2300  INR 1.1   Cardiac Enzymes: No results for input(s): CKTOTAL, CKMB, CKMBINDEX, TROPONINI in the last 168 hours. BNP (last 3 results) No results for input(s): PROBNP in the last 8760 hours. HbA1C: No results for input(s): HGBA1C in the last 72 hours.  CBG: Recent Labs  Lab 03/19/21 2305  GLUCAP 164*   Lipid Profile: No results for input(s): CHOL, HDL, LDLCALC, TRIG, CHOLHDL, LDLDIRECT in the last 72 hours.  Thyroid Function Tests: No results for input(s): TSH, T4TOTAL, FREET4, T3FREE, THYROIDAB in the last 72 hours. Anemia Panel: No results for input(s): VITAMINB12, FOLATE, FERRITIN, TIBC, IRON, RETICCTPCT in the last 72 hours. Urine analysis:    Component Value Date/Time   COLORURINE YELLOW 03/06/2020 1600   APPEARANCEUR HAZY (A) 03/06/2020 1600   LABSPEC 1.028 03/06/2020 1600   PHURINE 5.0 03/06/2020 1600   GLUCOSEU NEGATIVE 03/06/2020 1600   HGBUR NEGATIVE 03/06/2020 1600   HGBUR negative 09/25/2009 0928   BILIRUBINUR NEGATIVE 03/06/2020 1600   KETONESUR NEGATIVE 03/06/2020 1600   PROTEINUR NEGATIVE 03/06/2020 1600   UROBILINOGEN 1.0 10/20/2014 1310   NITRITE NEGATIVE 03/06/2020 1600   LEUKOCYTESUR NEGATIVE 03/06/2020 1600   Recent Results (from the past 240 hour(s))  Resp Panel by RT-PCR (Flu A&B, Covid) Nasopharyngeal Swab     Status: None   Collection Time: 03/19/21 11:23 PM   Specimen: Nasopharyngeal Swab; Nasopharyngeal(NP) swabs in vial transport medium  Result Value Ref Range Status   SARS Coronavirus 2 by RT PCR NEGATIVE NEGATIVE Final    Comment: (NOTE) SARS-CoV-2 target nucleic acids are NOT DETECTED.  The SARS-CoV-2 RNA is generally detectable in upper  respiratory specimens during the acute phase of infection. The lowest concentration of SARS-CoV-2 viral copies this assay can detect is 138 copies/mL. A negative result does not preclude SARS-Cov-2 infection and should not be used as the sole basis for treatment or other patient management decisions. A negative result may occur with  improper specimen collection/handling, submission of specimen other than nasopharyngeal swab, presence of viral mutation(s) within the areas targeted by this assay, and inadequate number of viral copies(<138 copies/mL). A negative result must be combined with clinical observations, patient history, and epidemiological information. The expected result is Negative.  Fact Sheet for Patients:  EntrepreneurPulse.com.au  Fact Sheet for Healthcare Providers:  IncredibleEmployment.be  This test is no t yet approved or cleared by the [place] FDA and  has been authorized for detection and/or diagnosis of SARS-CoV-2 by FDA under an Emergency Use Authorization (EUA). This EUA will remain  in effect (meaning this test can be used) for the duration of the COVID-19 declaration under Section 564(b)(1) of the Act, 21 U.S.C.section 360bbb-3(b)(1), unless the authorization is terminated  or revoked sooner.  Influenza A by PCR NEGATIVE NEGATIVE Final   Influenza B by PCR NEGATIVE NEGATIVE Final    Comment: (NOTE) The Xpert Xpress SARS-CoV-2/FLU/RSV plus assay is intended as an aid in the diagnosis of influenza from Nasopharyngeal swab specimens and should not be used as a sole basis for treatment. Nasal washings and aspirates are unacceptable for Xpert Xpress SARS-CoV-2/FLU/RSV testing.  Fact Sheet for Patients: EntrepreneurPulse.com.au  Fact Sheet for Healthcare Providers: IncredibleEmployment.be  This test is not yet approved or cleared by the [place] FDA and has been  authorized for detection and/or diagnosis of SARS-CoV-2 by FDA under an Emergency Use Authorization (EUA). This EUA will remain in effect (meaning this test can be used) for the duration of the COVID-19 declaration under Section 564(b)(1) of the Act, 21 U.S.C. section 360bbb-3(b)(1), unless the authorization is terminated or revoked.  Performed at [facility], [place] [address], [place], [place] [address]   MRSA Next Gen by PCR, Nasal     Status: None   Collection Time: 03/20/21 12:48 AM   Specimen: Nasal Mucosa; Nasal Swab  Result Value Ref Range Status   MRSA by PCR Next Gen NOT DETECTED NOT DETECTED Final    Comment: (NOTE) The GeneXpert MRSA Assay (FDA approved for NASAL specimens only), is one component of a comprehensive MRSA colonization surveillance program. It is not intended to diagnose MRSA infection nor to guide or monitor treatment for MRSA infections. Test performance is not FDA approved in patients less than 98 years old. Performed at [facility], [place] [address], [place], [place] [address]       Radiology Studies: No results found.  Scheduled Meds:  diltiazem  180 mg Oral Daily   dorzolamide-timolol  1 drop Both Eyes BID   Gerhardt's butt cream   Topical BID   mouth rinse  15 mL Mouth Rinse BID   pantoprazole  40 mg Oral Q1200   pregabalin  100 mg Oral BID   sodium chloride flush  3 mL Intravenous Once   Continuous Infusions:   LOS: 6 days    Patrecia Pour, MD [facility] [email or web address] 03/25/2021, 11:03 AM

## 2021-03-26 DIAGNOSIS — R32 Unspecified urinary incontinence: Secondary | ICD-10-CM

## 2021-03-26 DIAGNOSIS — L258 Unspecified contact dermatitis due to other agents: Secondary | ICD-10-CM

## 2021-03-26 LAB — BASIC METABOLIC PANEL
Anion gap: 8 (ref 5–15)
BUN: 14 mg/dL (ref 8–23)
CO2: 25 mmol/L (ref 22–32)
Calcium: 9.2 mg/dL (ref 8.9–10.3)
Chloride: 99 mmol/L (ref 98–111)
Creatinine, Ser: 0.61 mg/dL (ref 0.44–1.00)
GFR, Estimated: >60 mL/min (ref >60)
Glucose, Bld: 102 mg/dL — ABNORMAL HIGH (ref 70–99)
Potassium: 3.9 mmol/L (ref 3.5–5.1)
Sodium: 132 mmol/L — ABNORMAL LOW (ref 135–145)

## 2021-03-26 MED ORDER — GERHARDT'S BUTT CREAM: 1.0000 "application " | TOPICAL_CREAM | Freq: Two times a day (BID) | CUTANEOUS | Status: AC

## 2021-03-26 NOTE — Progress Notes (Signed)
Patient report called to RN at [facility]. All questions answered. Iv's removed and telemetry discontinued. [facility] arrived to transport patient. Patient belongings sent with [facility]. Patients daughter Caren Griffins called to get the rest of the patients' belongings that couldn't be transported (flowers).   Gwendolyn Grant, RN

## 2021-03-26 NOTE — Discharge Summary (Signed)
Physician Discharge Summary  Krystal Clarke GYJ:[id number] DOB: [birth year] DOA: 03/19/2021  PCP: Harlan Stains, MD  Admit date: 03/19/2021 Discharge date: 03/26/2021  Admitted From: Home Disposition: SNF   Recommendations for Outpatient Follow-up:  Follow up with [facility] in ~4 weeks.  Recommend restarting eliquis 5mg  po BID (for atrial fibrillation) ~1/16 if neurologically stable (delayed due to [place]). Monitor BMP, CBC in the next week  Home Health: N/A Equipment/Devices: Per SNF Discharge Condition: Stable CODE STATUS: Full Diet recommendation: Heart healthy  Brief/Interim Summary: Krystal Clarke is an 86 y.o. female with a history of glaucoma with resultant blindness, T2DM with neuropathy, Sjogren's syndrome, AFib on eliquis, CVA, HTN who presented after a fall noted to have rightward gaze deviation and left facial droop, found to have right basal ganglia ICH (~40ml) on CT. Eliquis was reversed with andexxa and she was admitted to the neuro-ICU on clevidipine gtt. With neurological improvement/stability, was weaned from infusion, transferred to medical floor under hospitalist service 1/5. PT/OT recommend SNF rehabilitation which is being pursued. Please see problem-based assessment for details below.  Discharge Diagnoses:  Principal Problem:   ICH (intracerebral hemorrhage) (HCC) Active Problems:   Dermatitis associated with moisture from urinary incontinence  Right basal ganglia intracerebral hemorrhage: No LVO on CTA. Chronic microhemorrhage in left thalamus. No CES on echo (LVEF 60-65%, mod LAE) - Restart statin at discharge. LDL 118. - Restart DOAC after discharge if stable.  - SNF rehabilitation pursued.   Right neck muscular strain: Improved. Can continue heating pad prn.   Right ankle bruising: Negative XR, reassuring exam.  - Supportive care, WBAT. Continue PT efforts.   Persistent atrial fibrillation: No longer appears to be paroxysmal, though rate remains  controlled throughout hospitalization thus far. - Continue diltiazem - Plan to restart eliquis if neurologically stable in ~2 weeks from hemorrhage per neurology.    T2DM: HbA1c 5.4%. No further Tx indicated at this time   HTN: BP at goal.  - Continue diltiazem as above   MASD:  - Barrier cream ordered. Interdry can also be applied. Keep areas as dry as possible.  Hyponatremia: Improving. Suspect hypovolemic.    Glaucoma:  - Continue gtt's   Peripheral neuropathy:  - Continue lyrica   GERD:  - PPI  Discharge Instructions  Allergies as of 03/26/2021       Reactions   Linaclotide Diarrhea   Other reaction(s): urgent stool/incontince of stool   Morphine Sulfate Other (See Comments)   Other reaction(s): crazy   Oxycodone-aspirin Nausea And Vomiting   Codeine Nausea And Vomiting, Other (See Comments)   Other reaction(s): nausea   Hydrocodone Nausea And Vomiting   Morphine And Related Nausea And Vomiting   Oxycodone Nausea And Vomiting   Pilocarpine Other (See Comments)   Reaction:  Unknown    Rivaroxaban Other (See Comments)   Reaction:  Back pain/headaches  Other reaction(s): muscle aches        Medication List     STOP taking these medications    Eliquis 5 MG Tabs tablet Generic drug: apixaban       TAKE these medications    acetaminophen 500 MG tablet Commonly known as: TYLENOL Take 500 mg by mouth every 6 (six) hours as needed for headache, fever, moderate pain or mild pain.   diltiazem 180 MG 24 hr capsule Commonly known as: CARDIZEM CD TAKE 1 CAPSULE BY MOUTH EVERY DAY What changed:  how much to take how to take this   diphenhydramine-acetaminophen 25-500 MG  Tabs tablet Commonly known as: TYLENOL PM Take 2 tablets by mouth at bedtime as needed (sleep).   dorzolamide-timolol 22.3-6.8 MG/ML ophthalmic solution Commonly known as: COSOPT Place 1 drop into both eyes 2 (two) times daily.   esomeprazole 40 MG capsule Commonly known as:  NEXIUM Take 40 mg by mouth daily.   Gerhardt's butt cream Crea Apply 1 application topically 2 (two) times daily.   hydroxypropyl methylcellulose / hypromellose 2.5 % ophthalmic solution Commonly known as: ISOPTO TEARS / GONIOVISC Place 1 drop into both eyes 3 (three) times daily as needed for dry eyes.   ondansetron 8 MG tablet Commonly known as: ZOFRAN Take 8 mg by mouth every 6 (six) hours as needed for vomiting or nausea.   pregabalin 100 MG capsule Commonly known as: LYRICA TAKE 1 CAPSULE BY MOUTH TWICE A DAY   sucralfate 1 g tablet Commonly known as: CARAFATE Take 1 g by mouth 4 (four) times daily.   ursodiol 300 MG capsule Commonly known as: ACTIGALL Take 300 mg by mouth 2 (two) times daily.        Contact information for follow-up providers     Harlan Stains, MD Follow up.   Specialty: Family Medicine Contact information: [address], [place] [address] [phone]         Garvin Fila, MD Follow up in 1 month(s).   Specialties: Neurology, Radiology Contact information: [address] [place] [address] [phone]              Contact information for after-discharge care     Destination     [facility] Preferred SNF .   Service: Skilled Nursing Contact information: [place] [address] [phone]                    Allergies  Allergen Reactions   Linaclotide Diarrhea    Other reaction(s): urgent stool/incontince of stool   Morphine Sulfate Other (See Comments)    Other reaction(s): crazy   Oxycodone-Aspirin Nausea And Vomiting   Codeine Nausea And Vomiting and Other (See Comments)    Other reaction(s): nausea   Hydrocodone Nausea And Vomiting   Morphine And Related Nausea And Vomiting   Oxycodone Nausea And Vomiting   Pilocarpine Other (See Comments)    Reaction:  Unknown    Rivaroxaban Other (See Comments)    Reaction:   Back pain/headaches  Other reaction(s): muscle aches    Consultations: Neurology  Procedures/Studies: CT ANGIO HEAD NECK W WO CM  Result Date: 03/21/2021 CLINICAL DATA:  Follow-up examination for neuro deficit, stroke suspected. EXAM: CT ANGIOGRAPHY HEAD AND NECK TECHNIQUE: Multidetector CT imaging of the head and neck was performed using the standard protocol during bolus administration of intravenous contrast. Multiplanar CT image reconstructions and MIPs were obtained to evaluate the vascular anatomy. Carotid stenosis measurements (when applicable) are obtained utilizing NASCET criteria, using the distal internal carotid diameter as the denominator. CONTRAST:  39mL OMNIPAQUE IOHEXOL 350 MG/ML SOLN COMPARISON:  Prior MRI and CT from 03/20/2021. FINDINGS: CT HEAD FINDINGS Brain: Intraparenchymal hemorrhage centered at the right basal ganglia is not significantly changed in size and morphology. Mild surrounding edema with partial effacement of the right lateral ventricle, but no other significant regional mass effect. No significant midline shift. No intraventricular extension or other complication. No other new intracranial hemorrhage or large vessel territory infarct. Underlying atrophy with chronic microvascular ischemic disease again noted. No mass lesion or  hydrocephalus. No extra-axial fluid collection. Vascular: No hyperdense vessel. Scattered vascular calcifications noted within the carotid siphons. Skull: Scalp soft tissues and calvarium demonstrate no new finding. Sinuses: Clear. Orbits: Globes orbital soft tissues demonstrate no new finding. Review of the MIP images confirms the above findings CTA NECK FINDINGS Aortic arch: Visualized aortic arch normal in caliber with normal branch pattern. Mild plaque about the arch itself. No stenosis about the origin the great vessels. Right carotid system: Right common and internal carotid arteries are diffusely tortuous but widely patent without stenosis or  dissection. Left carotid system: Left common and internal carotid arteries are diffusely tortuous but widely patent without stenosis or dissection. Vertebral arteries: Left vertebral artery arises directly from the aortic arch. Right vertebral artery dominant. Vertebral arteries tortuous but widely patent without stenosis or dissection. Skeleton: No discrete or worrisome osseous lesions. Patient is edentulous. Other neck: No other soft tissue abnormality within the neck. Chronic fatty atrophy involving the salivary glands noted. Few small thyroid nodules noted, largest of which measures 11 mm on the right. These are of doubtful significance given size and patient age, no follow-up imaging recommended (ref: J Am Coll Radiol. 2015 Feb;12(2): 143-50). Upper chest: Visualized upper chest demonstrates no acute finding. Review of the MIP images confirms the above findings CTA HEAD FINDINGS Anterior circulation: Both internal carotid arteries widely patent to the termini without stenosis. A1 segments widely patent. Normal anterior communicating artery complex. Both anterior cerebral arteries widely patent to their distal aspects without stenosis. No M1 stenosis or occlusion. Normal MCA bifurcations. Distal MCA branches well perfused and symmetric. Posterior circulation: Both V4 segments patent to the vertebrobasilar junction without stenosis. Both PICA origins patent and normal. Basilar widely patent to its distal aspect without stenosis. Superior cerebellar arteries patent bilaterally. Both PCAs primarily supplied via the basilar and are well perfused to there distal aspects. Venous sinuses: Grossly patent allowing for timing the contrast bolus. Anatomic variants: None significant. No aneurysm. No vascular abnormality seen underlying the right basal ganglia hemorrhage. Review of the MIP images confirms the above findings IMPRESSION: CT HEAD IMPRESSION: 1. No significant interval change in size and appearance of acute  right basal ganglia hemorrhage. Similar surrounding edema without significant regional mass effect. 2. No other new acute intracranial abnormality. CTA HEAD AND NECK IMPRESSION: 1. Negative CTA of the head and neck. No large vessel occlusion. Mild atherosclerotic disease for age, with no hemodynamically significant or correctable stenosis. No vascular abnormality seen underlying the right basal ganglia hemorrhage. 2. Diffuse tortuosity of the major arterial vasculature of the head and neck, suggesting chronic underlying hypertension. Electronically Signed   By: Jeannine Boga M.D.   On: 03/21/2021 05:15   CT HEAD WO CONTRAST  Result Date: 03/20/2021 CLINICAL DATA:  86 year old female code stroke presentation, right lentiform hemorrhage. EXAM: CT HEAD WITHOUT CONTRAST TECHNIQUE: Contiguous axial images were obtained from the base of the skull through the vertex without intravenous contrast. COMPARISON:  Head CT 03/19/2021.  Brain MRI 06/08/2020. FINDINGS: Brain: Biconvex hyperdense hemorrhage centered at the right lentiform encompasses 42 x 26 by 36 mm (AP by transverse by CC) for an estimated blood volume of 20 mL, stable when measured using the same technique previously. Surrounding edema but only mild regional mass effect is stable. No extra-axial or intraventricular extension identified. No midline shift. Basilar cisterns remain normal. Elsewhere Stable gray-white matter differentiation throughout the brain. Patchy and confluent white matter hypodensity. Vascular: Calcified atherosclerosis at the skull base. No suspicious intracranial vascular  hyperdensity. Skull: Mild motion artifact. No acute osseous abnormality identified. Sinuses/Orbits: Visualized paranasal sinuses and mastoids are stable and well aerated. Other: Postoperative changes to both globes. No acute orbit or scalp soft tissue finding. IMPRESSION: 1. Stable right basal ganglia hemorrhage since yesterday. Estimated blood volume up to 20 mL.  Surrounding edema with only mild regional mass effect. 2. No new intracranial abnormality. Electronically Signed   By: Genevie Ann M.D.   On: 03/20/2021 05:04   MR BRAIN W WO CONTRAST  Result Date: 03/20/2021 CLINICAL DATA:  86 year old female code stroke presentation, right lentiform hemorrhage. EXAM: MRI HEAD WITHOUT AND WITH CONTRAST TECHNIQUE: Multiplanar, multiecho pulse sequences of the brain and surrounding structures were obtained without and with intravenous contrast. CONTRAST:  102mL GADAVIST GADOBUTROL 1 MMOL/ML IV SOLN COMPARISON:  Head CT 0447 hours today. Head CT yesterday. Brain MRI 06/08/2020. FINDINGS: Brain: Mixed density but mostly T2 hyperintense and T1 isointense intra-axial hemorrhage centered at the right basal ganglia is 38 x 29 x 36 mm (AP by transverse by CC - 20 mL) with a small dark T2 nodular area along its inferior margin (series 10, image 16). But no associated enhancement. Susceptibility artifact on DWI with no larger area of restricted diffusion. Regional edema and stable regional mass effect including on the right lateral ventricle. No midline shift. No restricted diffusion elsewhere. Chronic microhemorrhages in the right temporal lobe and along the right parietooccipital sulcus are stable since March, but a left thalamic microhemorrhage is new since that time on series 14, image 30. That lesion is chronic. Otherwise stable gray and white matter signal with confluent, patchy bilateral T2 and FLAIR hyperintensity in the cerebral white matter and pons. No abnormal enhancement identified. No dural thickening. No restricted diffusion to suggest acute infarction. No evidence of mass lesion, ventriculomegaly. Cervicomedullary junction and pituitary are within normal limits. Vascular: Major intracranial vascular flow voids are stable since March with mild generalized intracranial artery tortuosity. Distal right vertebral artery of peers dominant. The major dural venous sinuses are enhancing  and appear to be patent. Skull and upper cervical spine: Negative visible cervical spine. Visualized bone marrow signal is within normal limits. Sinuses/Orbits: Stable postoperative changes to the globes. Paranasal Visualized paranasal sinuses and mastoids are stable and well aerated. Other: Visible internal auditory structures appear normal. Negative visible scalp and face. IMPRESSION: 1. Left basal ganglia hemorrhage is stable since presentation. 20 mL intra-axial volume with no IVH, evidence of underlying mass, or other acute intracranial abnormality. 2. A chronic microhemorrhage in the left thalamus is new since March. Elsewhere bilateral small vessel disease is stable. Electronically Signed   By: Genevie Ann M.D.   On: 03/20/2021 06:17   DG Ankle Right Port  Result Date: 03/21/2021 CLINICAL DATA:  Trauma, fall EXAM: PORTABLE RIGHT ANKLE - 2 VIEW COMPARISON:  None. FINDINGS: No recent fracture or dislocation is seen. Bony spurs are noted at the tips of medial and lateral malleoli. Ankle mortise is well-maintained. Bony structures in the lateral view are partly obscured by phalanges in the hand used for immobilization. IMPRESSION: No recent fracture or dislocation is seen. Electronically Signed   By: Elmer Picker M.D.   On: 03/21/2021 14:14   ECHOCARDIOGRAM COMPLETE  Result Date: 03/20/2021    ECHOCARDIOGRAM REPORT   Patient Name:   ARDELLE HALIBURTON Date of Exam: 03/20/2021 Medical Rec #:  [id number]         Height:       65.0 in Accession #:  [id number]        Weight:       164.9 lb Date of Birth:  1935/06/07          BSA:          1.822 m Patient Age:    24 years          BP:           121/84 mmHg Patient Gender: F                 HR:           83 bpm. Exam Location:  Inpatient Procedure: 2D Echo, Cardiac Doppler and Color Doppler Indications:    Stroke  History:        Patient has prior history of Echocardiogram examinations, most                 recent 06/11/2017. Arrythmias:Atrial Fibrillation; Risk                  Factors:Sleep Apnea, Diabetes and Hypertension. S/P SVT                 ablation. Hx stroke.  Sonographer:    Clayton Lefort RDCS (AE) Referring Phys: [id number] [place]  1. Left ventricular ejection fraction, by estimation, is 60 to 65%. The left ventricle has normal function. The left ventricle has no regional wall motion abnormalities. There is mild asymmetric left ventricular hypertrophy of the posterior wall, there is also basal-septum thickness (1.3 cm) with no LVOT obstruction. Left ventricular diastolic parameters are indeterminate.  2. Right ventricular systolic function is normal. The right ventricular size is normal.  3. Left atrial size was moderately dilated.  4. The mitral valve is normal in structure. Mild mitral valve regurgitation. No evidence of mitral stenosis.  5. The aortic valve is normal in structure. Aortic valve regurgitation is not visualized. Aortic valve sclerosis is present, with no evidence of aortic valve stenosis.  6. The inferior vena cava is normal in size with greater than 50% respiratory variability, suggesting right atrial pressure of 3 mmHg. FINDINGS  Left Ventricle: Left ventricular ejection fraction, by estimation, is 60 to 65%. The left ventricle has normal function. The left ventricle has no regional wall motion abnormalities. The left ventricular internal cavity size was normal in size. There is  mild asymmetric left ventricular hypertrophy of the posterior wall, there is also basal-septum thickness (1.3 cm) with no LVOT obstruction segment. Left ventricular diastolic parameters are indeterminate. Right Ventricle: The right ventricular size is normal. No increase in right ventricular wall thickness. Right ventricular systolic function is normal. Left Atrium: Left atrial size was moderately dilated. Right Atrium: Right atrial size was normal in size. Pericardium: There is no evidence of pericardial effusion. Mitral Valve: The mitral valve is  normal in structure. Mild mitral valve regurgitation. No evidence of mitral valve stenosis. Tricuspid Valve: The tricuspid valve is normal in structure. Tricuspid valve regurgitation is mild . No evidence of tricuspid stenosis. Aortic Valve: The aortic valve is normal in structure. Aortic valve regurgitation is not visualized. Aortic valve sclerosis is present, with no evidence of aortic valve stenosis. Aortic valve mean gradient measures 1.0 mmHg. Aortic valve peak gradient measures 2.0 mmHg. Aortic valve area, by VTI measures 2.42 cm. Pulmonic Valve: The pulmonic valve was not well visualized. Pulmonic valve regurgitation is not visualized. No evidence of pulmonic stenosis. Aorta: The aortic root is normal in size and structure. Venous:  The inferior vena cava is normal in size with greater than 50% respiratory variability, suggesting right atrial pressure of 3 mmHg. IAS/Shunts: No atrial level shunt detected by color flow Doppler.  LEFT VENTRICLE PLAX 2D LVIDd:         4.40 cm LVIDs:         3.20 cm LV PW:         1.20 cm LV IVS:        0.60 cm LVOT diam:     1.90 cm LV SV:         33 LV SV Index:   18 LVOT Area:     2.84 cm  RIGHT VENTRICLE            IVC RV Basal diam:  3.40 cm    IVC diam: 2.00 cm RV S prime:     6.85 cm/s TAPSE (M-mode): 1.4 cm LEFT ATRIUM              Index        RIGHT ATRIUM           Index LA diam:        4.50 cm  2.47 cm/m   RA Area:     18.00 cm LA Vol (A2C):   103.0 ml 56.52 ml/m  RA Volume:   42.30 ml  23.21 ml/m LA Vol (A4C):   51.4 ml  28.20 ml/m LA Biplane Vol: 73.9 ml  40.55 ml/m  AORTIC VALVE AV Area (Vmax):    2.55 cm AV Area (Vmean):   2.26 cm AV Area (VTI):     2.42 cm AV Vmax:           70.00 cm/s AV Vmean:          52.800 cm/s AV VTI:            0.135 m AV Peak Grad:      2.0 mmHg AV Mean Grad:      1.0 mmHg LVOT Vmax:         63.00 cm/s LVOT Vmean:        42.100 cm/s LVOT VTI:          0.115 m LVOT/AV VTI ratio: 0.85  AORTA Ao Root diam: 3.00 cm Ao Asc diam:  3.40  cm MR Peak grad: 120.1 mmHg MR Mean grad: 71.0 mmHg   SHUNTS MR Vmax:      548.00 cm/s Systemic VTI:  0.12 m MR Vmean:     390.0 cm/s  Systemic Diam: 1.90 cm Kardie Tobb DO Electronically signed by Berniece Salines DO Signature Date/Time: 03/20/2021/3:54:57 PM    Final    CT HEAD CODE STROKE WO CONTRAST  Result Date: 03/19/2021 CLINICAL DATA:  Code stroke. Initial evaluation for neuro deficit, stroke suspected. EXAM: CT HEAD WITHOUT CONTRAST TECHNIQUE: Contiguous axial images were obtained from the base of the skull through the vertex without intravenous contrast. COMPARISON:  MRI from 06/08/2020. FINDINGS: Brain: 3.5 x 2.8 x 3.2 cm acute intraparenchymal hemorrhage seen centered at the right lentiform nucleus/external capsule (series 2, image 23) (estimated volume 16 mL). Mild surrounding edema with partial effacement of the right lateral ventricle, but no significant midline shift at this time. Basilar cisterns remain patent. No other acute intracranial hemorrhage. No other acute large vessel territory infarct. Underlying atrophy with moderately advanced chronic microvascular ischemic disease. No visible mass lesion. No extra-axial fluid collection. Vascular: No hyperdense vessel. Skull: Scalp soft tissues and calvarium within normal limits. Sinuses/Orbits: Right gaze  noted. Prior bilateral ocular lens replacement. Patient status post scleral buckle on the right. Paranasal sinuses and mastoid air cells are clear. Other: None. ASPECTS [facility] Stroke Program Early CT Score) Acute intracranial hemorrhage, does not apply. IMPRESSION: 1. 3.5 x 2.8 x 3.2 cm (estimated volume 16 mL) acute intraparenchymal hemorrhage centered at the right lentiform nucleus/external capsule. Mild surrounding edema without significant midline shift. 2. Underlying age-related cerebral atrophy with moderately advanced chronic microvascular ischemic disease. These results were communicated to Dr. Lorrin Goodell at 11:16 pm on 03/19/2021 by text page  via the [facility] messaging system. Electronically Signed   By: Jeannine Boga M.D.   On: 03/19/2021 23:18    Subjective: No new issues, wants to start rehab so she can return home sooner.   Discharge Exam: Vitals:   03/26/21 0018 03/26/21 0424  BP: (!) 117/93 113/62  Pulse: 85 72  Resp: (!) 21 19  Temp: 98.6 F (37 C) 98.2 F (36.8 C)  SpO2: 95% 93%   General: Pt is alert, awake, not in acute distress. Very pleasant. Cardiovascular: RRR, S1/S2 +, no rubs, no gallops Respiratory: CTA bilaterally, no wheezing, no rhonchi Abdominal: Soft, NT, ND, bowel sounds + Extremities: No edema, no cyanosis Neuro: Alert, oriented, blind, no new deficits though remains severely diffusely weak limiting specificity on exam  Labs: Basic Metabolic Panel: Recent Labs  Lab 03/19/21 2300 03/19/21 2308 03/25/21 1126 03/26/21 0335  NA 130* 129* 128* 132*  K 4.0 4.0 3.9 3.9  CL 96* 96* 94* 99  CO2 24  --  24 25  GLUCOSE 162* 154* 128* 102*  BUN 9 9 17 14   CREATININE 0.78 0.60 0.76 0.61  CALCIUM 9.8  --  9.5 9.2   Liver Function Tests: Recent Labs  Lab 03/19/21 2300  AST 24  ALT 11  ALKPHOS 76  BILITOT 0.9  PROT 6.4*  ALBUMIN 3.7   CBC: Recent Labs  Lab 03/19/21 2300 03/19/21 2308 03/25/21 1126  WBC 13.2*  --  12.1*  NEUTROABS 10.3*  --   --   HGB 15.1* 16.3* 15.0  HCT 46.3* 48.0* 45.8  MCV 93.7  --  91.4  PLT 167  --  198   Urinalysis    Component Value Date/Time   COLORURINE YELLOW 03/06/2020 1600   APPEARANCEUR HAZY (A) 03/06/2020 1600   LABSPEC 1.028 03/06/2020 1600   PHURINE 5.0 03/06/2020 1600   GLUCOSEU NEGATIVE 03/06/2020 1600   HGBUR NEGATIVE 03/06/2020 1600   HGBUR negative 09/25/2009 0928   BILIRUBINUR NEGATIVE 03/06/2020 1600   KETONESUR NEGATIVE 03/06/2020 1600   PROTEINUR NEGATIVE 03/06/2020 1600   UROBILINOGEN 1.0 10/20/2014 1310   NITRITE NEGATIVE 03/06/2020 1600   LEUKOCYTESUR NEGATIVE 03/06/2020 1600   Microbiology Recent Results (from the  past 240 hour(s))  Resp Panel by RT-PCR (Flu A&B, Covid) Nasopharyngeal Swab     Status: None   Collection Time: 03/19/21 11:23 PM   Specimen: Nasopharyngeal Swab; Nasopharyngeal(NP) swabs in vial transport medium  Result Value Ref Range Status   SARS Coronavirus 2 by RT PCR NEGATIVE NEGATIVE Final    Comment: (NOTE) SARS-CoV-2 target nucleic acids are NOT DETECTED.  The SARS-CoV-2 RNA is generally detectable in upper respiratory specimens during the acute phase of infection. The lowest concentration of SARS-CoV-2 viral copies this assay can detect is 138 copies/mL. A negative result does not preclude SARS-Cov-2 infection and should not be used as the sole basis for treatment or other patient management decisions. A negative result may occur with  improper specimen collection/handling, submission of specimen other than nasopharyngeal swab, presence of viral mutation(s) within the areas targeted by this assay, and inadequate number of viral copies(<138 copies/mL). A negative result must be combined with clinical observations, patient history, and epidemiological information. The expected result is Negative.  Fact Sheet for Patients:  EntrepreneurPulse.com.au  Fact Sheet for Healthcare Providers:  IncredibleEmployment.be  This test is no t yet approved or cleared by the [place] FDA and  has been authorized for detection and/or diagnosis of SARS-CoV-2 by FDA under an Emergency Use Authorization (EUA). This EUA will remain  in effect (meaning this test can be used) for the duration of the COVID-19 declaration under Section 564(b)(1) of the Act, 21 U.S.C.section 360bbb-3(b)(1), unless the authorization is terminated  or revoked sooner.       Influenza A by PCR NEGATIVE NEGATIVE Final   Influenza B by PCR NEGATIVE NEGATIVE Final    Comment: (NOTE) The Xpert Xpress SARS-CoV-2/FLU/RSV plus assay is intended as an aid in the diagnosis of  influenza from Nasopharyngeal swab specimens and should not be used as a sole basis for treatment. Nasal washings and aspirates are unacceptable for Xpert Xpress SARS-CoV-2/FLU/RSV testing.  Fact Sheet for Patients: EntrepreneurPulse.com.au  Fact Sheet for Healthcare Providers: IncredibleEmployment.be  This test is not yet approved or cleared by the [place] FDA and has been authorized for detection and/or diagnosis of SARS-CoV-2 by FDA under an Emergency Use Authorization (EUA). This EUA will remain in effect (meaning this test can be used) for the duration of the COVID-19 declaration under Section 564(b)(1) of the Act, 21 U.S.C. section 360bbb-3(b)(1), unless the authorization is terminated or revoked.  Performed at [facility], [place] [address] [place], [place] [address]   MRSA Next Gen by PCR, Nasal     Status: None   Collection Time: 03/20/21 12:48 AM   Specimen: Nasal Mucosa; Nasal Swab  Result Value Ref Range Status   MRSA by PCR Next Gen NOT DETECTED NOT DETECTED Final    Comment: (NOTE) The GeneXpert MRSA Assay (FDA approved for NASAL specimens only), is one component of a comprehensive MRSA colonization surveillance program. It is not intended to diagnose MRSA infection nor to guide or monitor treatment for MRSA infections. Test performance is not FDA approved in patients less than 21 years old. Performed at [facility], [place] [address], [place], [place] [address]   SARS CORONAVIRUS 2 (TAT 6-24 HRS) Nasopharyngeal Nasopharyngeal Swab     Status: None   Collection Time: 03/25/21 11:04 AM   Specimen: Nasopharyngeal Swab  Result Value Ref Range Status   SARS Coronavirus 2 NEGATIVE NEGATIVE Final    Comment: (NOTE) SARS-CoV-2 target nucleic acids are NOT DETECTED.  The SARS-CoV-2 RNA is generally detectable in upper and lower respiratory specimens during the acute phase of infection. Negative results do not  preclude SARS-CoV-2 infection, do not rule out co-infections with other pathogens, and should not be used as the sole basis for treatment or other patient management decisions. Negative results must be combined with clinical observations, patient history, and epidemiological information. The expected result is Negative.  Fact Sheet for Patients: SugarRoll.be  Fact Sheet for Healthcare Providers: https://www.woods-mathews.com/  This test is not yet approved or cleared by the [place] FDA and  has been authorized for detection and/or diagnosis of SARS-CoV-2 by FDA under an Emergency Use Authorization (EUA). This EUA will remain  in effect (meaning this test can be used) for the duration of  the COVID-19 declaration under Se ction 564(b)(1) of the Act, 21 U.S.C. section 360bbb-3(b)(1), unless the authorization is terminated or revoked sooner.  Performed at [facility], [place] [address] [place],  [address]     Time coordinating discharge: Approximately 40 minutes  Patrecia Pour, MD  [facility] 03/26/2021, 7:25 AM

## 2021-03-26 NOTE — TOC Transition Note (Signed)
Transition of Care [facility]) - CM/SW Discharge Note   Patient Details  Name: Krystal Clarke MRN: [id number] Date of Birth: 05/27/1935  Transition of Care [facility]) CM/SW Contact:  Geralynn Ochs, LCSW Phone Number: 03/26/2021, 11:11 AM   Clinical Narrative:   Nurse to call report to [phone], Room 110.    Final next level of care: Skilled Nursing Facility Barriers to Discharge: Barriers Resolved   Patient Goals and CMS Choice Patient states their goals for this hospitalization and ongoing recovery are:: to get rehab CMS Medicare.gov Compare Post Acute Care list provided to:: Patient Represenative (must comment) Choice offered to / list presented to : Adult Children  Discharge Placement              Patient chooses bed at:  ([facility]) Patient to be transferred to facility by: [place] Name of family member notified: Cindy Patient and family notified of of transfer: 03/26/21  Discharge Plan and Services     Post Acute Care Choice: [place]                               Social Determinants of Health (SDOH) Interventions     Readmission Risk Interventions No flowsheet data found.

## 2021-04-06 ENCOUNTER — Telehealth: Payer: Self-pay

## 2021-04-06 ENCOUNTER — Encounter: Payer: Self-pay | Admitting: Neurology

## 2021-04-06 NOTE — Telephone Encounter (Signed)
Pt daughter called follow up to mychart message pt is scheduled to see Dr Posey Pronto on 2/20, not today. I'm sorry to hear about her mother and she always has my best wishes.  If they would like to get hospice involved, they can initiate this with her primary team at rehab. Pt daughter verbalized understanding

## 2021-04-06 NOTE — Telephone Encounter (Signed)
Pt daughter called see phone note

## 2021-04-11 ENCOUNTER — Telehealth: Payer: Self-pay | Admitting: Neurology

## 2021-04-11 NOTE — Telephone Encounter (Signed)
Patient had a stroke on 03-19-21 and her daughter would like to see if they can get Hospice care or palliative care. Patient has appt with Dr Posey Pronto on 05-07-21.  Please call patient daughter Administrator

## 2021-04-12 NOTE — Telephone Encounter (Signed)
Called and spoke to patients daughter and she wanted to let Dr. Posey Pronto know that patients is ok but she is not herself anymore. Patients daughter stated it's hard to explain but her mom is just not herself and reports she is immobile, can't see, and can't feed herself.   Patient also wanted to let Dr. Posey Pronto know that she doesn't think mom is needing hospice at this time but more so palliative care to help with her current situation since the stroke.   Patients daughter is ok with VV tomorrow at 2:30 pm and patient will be there with her.

## 2021-04-12 NOTE — Telephone Encounter (Signed)
We can send the referral, but I do not have any updated documentation with her current clinical assessment, so they may not accept it. I have a follow-up open tomorrow, if they would like to set up a visit, even VV (patient needs to be present).

## 2021-04-12 NOTE — Telephone Encounter (Signed)
Noted  

## 2021-04-13 ENCOUNTER — Telehealth (INDEPENDENT_AMBULATORY_CARE_PROVIDER_SITE_OTHER): Payer: Medicare HMO | Admitting: Neurology

## 2021-04-13 ENCOUNTER — Other Ambulatory Visit: Payer: Self-pay

## 2021-04-13 DIAGNOSIS — G629 Polyneuropathy, unspecified: Secondary | ICD-10-CM | POA: Diagnosis not present

## 2021-04-13 DIAGNOSIS — I610 Nontraumatic intracerebral hemorrhage in hemisphere, subcortical: Secondary | ICD-10-CM | POA: Diagnosis not present

## 2021-04-13 DIAGNOSIS — R5381 Other malaise: Secondary | ICD-10-CM | POA: Diagnosis not present

## 2021-04-13 NOTE — Progress Notes (Signed)
° °  Virtual Visit via Video Note The purpose of this virtual visit is to provide medical care while limiting exposure to the novel coronavirus.    Consent was obtained for video visit:  Yes.   Answered questions that patient had about telehealth interaction:  Yes.   I discussed the limitations, risks, security and privacy concerns of performing an evaluation and management service by telemedicine. I also discussed with the patient that there may be a patient responsible charge related to this service. The patient expressed understanding and agreed to proceed.  Pt location: Home Physician Location: office Name of referring provider:  Harlan Stains, MD I connected with Krystal Clarke at patients initiation/request on 04/13/2021 at  2:30 PM EST by video enabled telemedicine application and verified that I am speaking with the correct person using two identifiers. Pt MRN:  [id number] Pt DOB:  04-22-1935 Video Participants:  Krystal Clarke;  daughter, Cynde   History of Present Illness: This is a 86 y.o. female returning for hospital discharge follow-up of ICH.  She presented to ER on 1/2 following fall with right gaze deviation, left facial droop, and left side weakness.  CT head showed right basal ganglia ICH. She was on Eliquis which was reversed with Andexxa.  MRI brain again demonstrated right basal ganglia ICH, no underlying mass.  ICH thought to be due to due to anticoagulation vs hemorrhagic conversation of ischemic stroke.  She was discharged to SNF. Since being at SNF, she has not made any progress with therapy. She is nonambulatory with dense weakness in the left side. Appetite and good. Daughter notices that her affect and mood is not the same.  She is concerned about her prognosis for meaningful recovery given no improvement over the past few weeks.  Observations/Objective:   There were no vitals filed for this visit. Patient is awake, laying in her bed.  Eyelid apraxia.  She is able  to open the eyelids on command.  She follows simple commands.  Speech is soft, not dysarthric There is left facial droop.  Tongue protrudes to midline. RUE antigravity, LUE 2/5, she is able to wiggle toes R >> L   Assessment and Plan:  Right basal ganglia intracranial hemorrhage while on eliquis, which she takes for atrial fibrillation.  She has residual deficits of left sided weakness, eyelid apraxia, and some neglect. Prior her her stroke, she was ambulatory and able to do most ADLs, but had general debility from visual impairment (Sjogren's disease) and severe neuropathy causing gait difficulty.  At this time, she is dependent for most ADLs, except feeding.  I will send a referral to Palliative Care for Hospice evaluation.  Eventually, family plans to bring patient home with in-home care.   Follow Up Instructions:   I discussed the assessment and treatment plan with the patient. The patient was provided an opportunity to ask questions and all were answered. The patient agreed with the plan and demonstrated an understanding of the instructions.   The patient was advised to call back or seek an in-person evaluation if the symptoms worsen or if the condition fails to improve as anticipated.  Total time spent reviewing records, interview, history/exam, documentation, and coordination of care on day of encounter:  30 min     Nichols, DO

## 2021-04-18 ENCOUNTER — Telehealth: Payer: Self-pay

## 2021-04-18 NOTE — Telephone Encounter (Signed)
Spoke with patient's daughter Krystal Clarke she reports patient is currently at [facility]. Will cancel Palliative home referral and referral specialist will request a new order for facility.

## 2021-04-24 ENCOUNTER — Telehealth: Payer: Self-pay

## 2021-04-24 NOTE — Telephone Encounter (Signed)
Spoke with Mia ([facility])  and scheduled a Mychart Palliative Consult for 04/30/21 @ 1 PM.   Consent obtained; updated Outlook/Netsmart/Team List and Epic.

## 2021-04-30 ENCOUNTER — Other Ambulatory Visit: Payer: Self-pay

## 2021-04-30 ENCOUNTER — Telehealth: Payer: Medicare HMO | Admitting: Internal Medicine

## 2021-04-30 NOTE — Progress Notes (Deleted)
[facility] Consult Note Telephone: [phone]  Fax: [phone]   Date of encounter: 04/30/21 8:18 AM PATIENT NAME: Krystal Clarke [address] [place] [address]   [phone] (home)  DOB: Mar 19, 1935 MRN: [id number] PRIMARY CARE PROVIDER:    Harlan Stains, MD,  [address] [place] [address] Vici:   Krystal Stains, MD [place],  [place] [address] [phone]  RESPONSIBLE PARTY:    Contact Information     Name Relation Home Work Mobile   Clarke,Krystal Daughter   [phone]   Clarke, Krystal [phone]  [phone]       Due to the COVID-19 crisis, this visit was done via telemedicine from my office and it was initiated and consent by this patient and or family.  I connected with  Krystal Clarke OR PROXY on 04/30/21 by a video enabled telemedicine application and verified that I am speaking with the correct person using two identifiers.   I discussed the limitations of evaluation and management by telemedicine. The patient expressed understanding and agreed to proceed.   Palliative Care was asked to follow this patient by consultation request of  Krystal Stains, MD to address advance care planning and complex medical decision making. This is the initial visit.                                     ASSESSMENT AND PLAN / RECOMMENDATIONS:   Advance Care Planning/Goals of Care: Goals include to maximize quality of life and symptom management. Patient/health care surrogate gave his/her permission to discuss.Our advance care planning conversation included a discussion about:    The value and importance of advance care planning  Experiences with loved ones who have been seriously ill or have died  Exploration of personal, cultural or spiritual beliefs that might influence medical decisions  Exploration of goals of care in  the event of a sudden injury or illness  Identification  of a healthcare agent  Review and updating or creation of an  advance directive document . Decision not to resuscitate or to de-escalate disease focused treatments due to poor prognosis. CODE STATUS:  Symptom Management/Plan:    Follow up Palliative Care Visit: Palliative care will continue to follow for complex medical decision making, advance care planning, and clarification of goals. Return *** weeks or prn.  I spent *** minutes providing this consultation. More than 50% of the time in this consultation was spent in counseling and care coordination.  This visit was coded based on medical decision making (MDM).***  PPS: ***0%  HOSPICE ELIGIBILITY/DIAGNOSIS: TBD  Chief Complaint: ***  HISTORY OF PRESENT ILLNESS:  Krystal Clarke is a 86 y.o. year old female  with *** .   History obtained from review of EMR, discussion with primary team, and interview with family, facility staff/caregiver and/or Krystal Clarke.  I reviewed available labs, medications, imaging, studies and related documents from the EMR.  Records reviewed and summarized above.   ROS  *** General: NAD EYES: denies vision changes ENMT: denies dysphagia Cardiovascular: denies chest pain, denies DOE Pulmonary: denies cough, denies increased SOB Abdomen: endorses good appetite, denies constipation, endorses continence of bowel GU: denies dysuria, endorses continence of urine MSK:  denies increased weakness,  no falls reported Skin: denies rashes or wounds Neurological: denies pain, denies insomnia Psych: Endorses positive  mood Heme/lymph/immuno: denies bruises, abnormal bleeding  Physical Exam: Current and past weights: Constitutional: NAD General: frail appearing, thin/WNWD/obese  EYES: anicteric sclera, lids intact, no discharge  ENMT: intact hearing, oral mucous membranes moist, dentition intact CV: S1S2, RRR, no LE edema Pulmonary: LCTA, no  increased work of breathing, no cough, room air Abdomen: intake 100%, normo-active BS + 4 quadrants, soft and non tender, no ascites GU: deferred MSK: no sarcopenia, moves all extremities, ambulatory Skin: warm and dry, no rashes or wounds on visible skin Neuro:  no generalized weakness,  no cognitive impairment Psych: non-anxious affect, A and O x 3 Hem/lymph/immuno: no widespread bruising CURRENT PROBLEM LIST:  Patient Active Problem List   Diagnosis Date Noted   Dermatitis associated with moisture from urinary incontinence 03/22/2021   ICH (intracerebral hemorrhage) (HCC) 03/19/2021   Stroke ([place])    Siriasis    PONV (postoperative nausea and vomiting)    Pneumonia    Peripheral neuropathy    OP (osteoporosis)    Neuropathy    Migraines    Migraine triggered seizures ([place])    Legally blind    Hypertension    History of diverticulitis    Glucose intolerance (impaired glucose tolerance)    Glaucoma    GERD (gastroesophageal reflux disease)    Complication of anesthesia    Blindness of right eye    Allergy    GI bleed 04/16/2016   Symptomatic anemia 04/16/2016   Hypoxia - on oxygen 02/10/2016   Diabetes mellitus without complication (HCC)    Obesity (BMI 54-00.8) 67/61/9509   Lichen sclerosus 32/67/1245   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 02/07/2016 02/07/2016   Chest pain 11/04/2014   Elevated white blood cell count 11/04/2014   B12 deficiency 05/20/2013   Seizure disorder ([place]) 12/07/2012   Dehydration with hyponatremia 12/07/2012   Atrial fibrillation ([place]) 12/06/2012   Paroxysmal atrial fibrillation ([place]) 12/06/2012   Acute respiratory failure with hypoxia (HCC) 12/06/2012   Gait disorder 06/05/2012   Sjogren's syndrome ([place])    Hereditary and idiopathic peripheral neuropathy 05/09/2012   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 ([place]) 05/09/2012   History of detached retina repair 02/24/2012   Primary open angle  glaucoma of both eyes, severe stage 02/24/2012   ANEMIA, B12 DEFICIENCY 07/19/2009   Anxiety state 01/27/2009   Sleep apnea 12/19/2008   MIXED HYPERLIPIDEMIA 07/18/2008   HYPERTRIGLYCERIDEMIA 08/28/2006   Migraine headache 08/28/2006   Essential hypertension 08/28/2006   GERD 08/28/2006   PSORIASIS 08/28/2006   Osteoarthritis 08/28/2006   OSTEOPOROSIS 08/28/2006   PAST MEDICAL HISTORY:  Active Ambulatory Problems    Diagnosis Date Noted   HYPERTRIGLYCERIDEMIA 08/28/2006   MIXED HYPERLIPIDEMIA 07/18/2008   ANEMIA, B12 DEFICIENCY 07/19/2009   Anxiety state 01/27/2009   Migraine headache 08/28/2006   Essential hypertension 08/28/2006   GERD 08/28/2006   PSORIASIS 08/28/2006   Osteoarthritis 08/28/2006   OSTEOPOROSIS 08/28/2006   Sleep apnea 12/19/2008   Hereditary and idiopathic peripheral neuropathy 05/09/2012   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 ([place]) 05/09/2012   Sjogren's syndrome ([place])    Atrial fibrillation ([place]) 12/06/2012   Seizure disorder ([place]) 12/07/2012   B12 deficiency 05/20/2013   Chest pain 11/04/2014   Elevated white blood cell count 11/04/2014   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)    Legally blind    Hypertension    History of diverticulitis    Glucose intolerance (impaired glucose tolerance)    Glaucoma    GERD  (gastroesophageal reflux disease)    Gait disorder 06/05/2012   Dehydration with hyponatremia 93/26/7124   Complication of anesthesia    Blindness of right eye    Allergy    Acute respiratory failure with hypoxia ([place]) 12/06/2012   ICH (intracerebral hemorrhage) ([place]) 03/19/2021   Dermatitis associated with moisture from urinary incontinence 03/22/2021   Resolved Ambulatory Problems    Diagnosis Date Noted   CANDIDIASIS, VULVAR 09/25/2009   GLAUCOMA 08/28/2006   OTITIS MEDIA, ACUTE 12/31/2006   EAR PAIN, RIGHT 01/08/2007   URI 12/31/2006   GUAIAC POSITIVE STOOL 11/18/2006   INSOMNIA 04/07/2007   FATIGUE 10/29/2007   SYMPTOM, DIARRHEA NOS 11/18/2006   Dysuria 01/27/2009   ABDOMINAL PAIN OTHER SPECIFIED SITE 01/27/2009   Mammographic microcalcification 03/09/2008   DIVERTICULITIS, HX OF 01/27/2009   SHINGLES, HX OF 07/17/2009   Memory loss 05/09/2012   Sudden visual loss 05/09/2012   TIA 05/09/2012   Peripheral neuropathy (HCC)    Gait disorder 06/05/2012   Acute respiratory failure with hypoxia ([place]) 12/06/2012   Dehydration with hyponatremia 12/07/2012   Past Medical History:  Diagnosis Date   Dysrhythmia    SOCIAL HX:  Social History   Tobacco Use   Smoking status: Former    Types: Cigarettes   Smokeless tobacco: Never   Tobacco comments:    quit smoking before age of 8  Substance Use Topics   Alcohol use: No    Alcohol/week: 0.0 standard drinks   FAMILY HX:  Family History  Problem Relation Age of Onset   Stroke Mother    Migraines Mother    Stomach cancer Father    Sjogren's syndrome Father    Sjogren's syndrome Brother    Sjogren's syndrome Brother    Scleroderma Sister        raynaud's scleroderma   Lymphoma Daughter    Migraines Daughter    Heart disease Other        maternal side   Stroke Other        maternal side, 1st degree female <60      ALLERGIES:  Allergies  Allergen Reactions   Linaclotide Diarrhea    Other reaction(s): urgent  stool/incontince of stool   Morphine Sulfate Other (See Comments)    Other reaction(s): crazy   Oxycodone-Aspirin Nausea And Vomiting   Codeine Nausea And Vomiting and Other (See Comments)    Other reaction(s): nausea   Hydrocodone Nausea And Vomiting   Morphine And Related Nausea And Vomiting   Oxycodone Nausea And Vomiting   Pilocarpine Other (See Comments)    Reaction:  Unknown    Rivaroxaban Other (See Comments)    Reaction:  Back pain/headaches  Other reaction(s): muscle aches     PERTINENT MEDICATIONS:  Outpatient Encounter Medications as of 04/30/2021  Medication Sig   acetaminophen (TYLENOL) 500 MG tablet Take 500  mg by mouth every 6 (six) hours as needed for headache, fever, moderate pain or mild pain.   diltiazem (CARDIZEM CD) 180 MG 24 hr capsule TAKE 1 CAPSULE BY MOUTH EVERY DAY (Patient taking differently: 180 mg daily.)   diphenhydramine-acetaminophen (TYLENOL PM) 25-500 MG TABS tablet Take 2 tablets by mouth at bedtime as needed (sleep).   dorzolamide-timolol (COSOPT) 22.3-6.8 MG/ML ophthalmic solution Place 1 drop into both eyes 2 (two) times daily.   esomeprazole (NEXIUM) 40 MG capsule Take 40 mg by mouth daily.   hydroxypropyl methylcellulose / hypromellose (ISOPTO TEARS / GONIOVISC) 2.5 % ophthalmic solution Place 1 drop into both eyes 3 (three) times daily as needed for dry eyes.   Nystatin (GERHARDT'S BUTT CREAM) CREA Apply 1 application topically 2 (two) times daily.   ondansetron (ZOFRAN) 8 MG tablet Take 8 mg by mouth every 6 (six) hours as needed for vomiting or nausea.   pregabalin (LYRICA) 100 MG capsule TAKE 1 CAPSULE BY MOUTH TWICE A DAY (Patient taking differently: Take 100 mg by mouth 2 (two) times daily.)   sucralfate (CARAFATE) 1 g tablet Take 1 g by mouth 4 (four) times daily.   ursodiol (ACTIGALL) 300 MG capsule Take 300 mg by mouth 2 (two) times daily.   No facility-administered encounter medications on file as of 04/30/2021.   Thank you for the  opportunity to participate in the care of Krystal Clarke.  The palliative care team will continue to follow. Please call our office at [phone] if we can be of additional assistance.   Jakirah Zaun, DO ,   COVID-19 PATIENT SCREENING TOOL Asked and negative response unless otherwise noted:  Have you had symptoms of covid, tested positive or been in contact with someone with symptoms/positive test in the past 5-10 days?

## 2021-05-01 ENCOUNTER — Telehealth: Payer: Self-pay

## 2021-05-01 ENCOUNTER — Telehealth: Payer: Self-pay | Admitting: Neurology

## 2021-05-01 NOTE — Telephone Encounter (Signed)
Attempted to contact patient's daughter Caren Griffins to reschedule Palliative Care consult appointment that was scheduled for 04/30/21. No answer left a message to return call.

## 2021-05-01 NOTE — Telephone Encounter (Signed)
Patients daughter called in stating that she thinks hospice needs to be called in for her mother.  She wasn't sure the steps to take for that.

## 2021-05-01 NOTE — Telephone Encounter (Signed)
It looks like palliative care has been trying to contact patient's daughter (see epic notes) and there was a no show visit yesterday. Palliative care and hospice is under the same department, so they will be able to assess her needs.

## 2021-05-01 NOTE — Telephone Encounter (Signed)
Attempted to contact patient's daughter Krystal Clarke this morning to reschedule Palliative Care consult appointment that was scheduled on 2/13 Mychart. Daughter then sent a Mychart message   Hello, evidently I missed a video call that I was unaware would happen yesterday. Mom has been ohome 3 days and we have no one from [place] yet  I called the daughter Krystal Clarke back to reschedule consult. I offered another Mychart visit for 2/20. She proceeds to say, just forget it if you'll can't do any better than that we don't need you'll and hung up the phone on me. I will cancel referral and notify referring provider.

## 2021-05-02 NOTE — Telephone Encounter (Signed)
Called and spoke to patients daughter and she has informed me that patient has been set up with Hospice care through the PCP and wanted to thank Dr. Posey Pronto for all her help. I informed patients daughter to please let us know if she needs anything.   Patients daughter requested to have the appointment on 05/07/2021 at 10:50 AM canceled. I have sent a message to the front to have appt canceled.

## 2021-05-02 NOTE — Telephone Encounter (Signed)
Noted  

## 2021-05-07 ENCOUNTER — Ambulatory Visit: Payer: Medicare Other | Admitting: Neurology

## 2021-05-07 ENCOUNTER — Ambulatory Visit: Payer: Medicare HMO | Admitting: Neurology

## 2021-05-08 NOTE — Progress Notes (Signed)
Visit did not happen.

## 2021-05-16 DEATH — deceased
